# Patient Record
Sex: Male | Born: 1935 | Race: White | Hispanic: No | Marital: Married | Smoking: Former smoker
Health system: Southern US, Community
[De-identification: ages and names within clinical notes are randomized; demographics above are authoritative.]

## PROBLEM LIST (undated history)

## (undated) DIAGNOSIS — G609 Hereditary and idiopathic neuropathy, unspecified: Secondary | ICD-10-CM

## (undated) DIAGNOSIS — I739 Peripheral vascular disease, unspecified: Secondary | ICD-10-CM

## (undated) DIAGNOSIS — K219 Gastro-esophageal reflux disease without esophagitis: Secondary | ICD-10-CM

## (undated) DIAGNOSIS — R519 Headache, unspecified: Secondary | ICD-10-CM

## (undated) DIAGNOSIS — C61 Malignant neoplasm of prostate: Secondary | ICD-10-CM

## (undated) DIAGNOSIS — K209 Esophagitis, unspecified: Secondary | ICD-10-CM

## (undated) DIAGNOSIS — K644 Residual hemorrhoidal skin tags: Secondary | ICD-10-CM

## (undated) DIAGNOSIS — F528 Other sexual dysfunction not due to a substance or known physiological condition: Secondary | ICD-10-CM

## (undated) DIAGNOSIS — E538 Deficiency of other specified B group vitamins: Secondary | ICD-10-CM

## (undated) DIAGNOSIS — Z9089 Acquired absence of other organs: Secondary | ICD-10-CM

## (undated) DIAGNOSIS — K2980 Duodenitis without bleeding: Secondary | ICD-10-CM

## (undated) DIAGNOSIS — M109 Gout, unspecified: Secondary | ICD-10-CM

## (undated) DIAGNOSIS — I10 Essential (primary) hypertension: Secondary | ICD-10-CM

## (undated) DIAGNOSIS — K222 Esophageal obstruction: Secondary | ICD-10-CM

## (undated) DIAGNOSIS — H33311 Horseshoe tear of retina without detachment, right eye: Secondary | ICD-10-CM

## (undated) DIAGNOSIS — Z87891 Personal history of nicotine dependence: Secondary | ICD-10-CM

## (undated) DIAGNOSIS — G250 Essential tremor: Secondary | ICD-10-CM

## (undated) DIAGNOSIS — G252 Other specified forms of tremor: Secondary | ICD-10-CM

## (undated) HISTORY — DX: Essential tremor: G25.0

## (undated) HISTORY — DX: Malignant neoplasm of prostate: C61

## (undated) HISTORY — DX: Esophageal obstruction: K22.2

## (undated) HISTORY — DX: Duodenitis without bleeding: K29.80

## (undated) HISTORY — DX: Esophagitis, unspecified: K20.9

## (undated) HISTORY — PX: TONSILLECTOMY: SHX5217

## (undated) HISTORY — DX: Other specified forms of tremor: G25.2

## (undated) HISTORY — DX: Gastro-esophageal reflux disease without esophagitis: K21.9

## (undated) HISTORY — DX: Deficiency of other specified B group vitamins: E53.8

## (undated) HISTORY — DX: Horseshoe tear of retina without detachment, right eye: H33.311

## (undated) HISTORY — DX: Residual hemorrhoidal skin tags: K64.4

## (undated) HISTORY — DX: Hereditary and idiopathic neuropathy, unspecified: G60.9

## (undated) HISTORY — PX: OTHER SURGICAL HISTORY: SHX169

## (undated) HISTORY — DX: Essential (primary) hypertension: I10

## (undated) HISTORY — DX: Gout, unspecified: M10.9

## (undated) HISTORY — DX: Acquired absence of other organs: Z90.89

## (undated) HISTORY — DX: Peripheral vascular disease, unspecified: I73.9

## (undated) HISTORY — DX: Other sexual dysfunction not due to a substance or known physiological condition: F52.8

## (undated) HISTORY — DX: Personal history of nicotine dependence: Z87.891

---

## 2004-06-03 ENCOUNTER — Ambulatory Visit: Payer: Self-pay | Admitting: Internal Medicine

## 2004-06-10 ENCOUNTER — Ambulatory Visit: Payer: Self-pay | Admitting: Internal Medicine

## 2004-07-08 ENCOUNTER — Ambulatory Visit: Payer: Self-pay

## 2004-07-09 ENCOUNTER — Ambulatory Visit: Payer: Self-pay | Admitting: Gastroenterology

## 2004-08-04 ENCOUNTER — Ambulatory Visit (HOSPITAL_BASED_OUTPATIENT_CLINIC_OR_DEPARTMENT_OTHER): Admission: RE | Admit: 2004-08-04 | Discharge: 2004-08-04 | Payer: Self-pay | Admitting: General Surgery

## 2004-08-04 ENCOUNTER — Ambulatory Visit (HOSPITAL_COMMUNITY): Admission: RE | Admit: 2004-08-04 | Discharge: 2004-08-04 | Payer: Self-pay | Admitting: General Surgery

## 2004-08-04 ENCOUNTER — Ambulatory Visit: Payer: Self-pay | Admitting: Gastroenterology

## 2004-08-04 ENCOUNTER — Encounter (INDEPENDENT_AMBULATORY_CARE_PROVIDER_SITE_OTHER): Payer: Self-pay | Admitting: *Deleted

## 2004-09-05 ENCOUNTER — Ambulatory Visit: Payer: Self-pay | Admitting: Gastroenterology

## 2004-09-11 ENCOUNTER — Ambulatory Visit: Payer: Self-pay | Admitting: Gastroenterology

## 2004-10-16 ENCOUNTER — Ambulatory Visit: Payer: Self-pay | Admitting: Gastroenterology

## 2004-10-22 ENCOUNTER — Ambulatory Visit: Payer: Self-pay | Admitting: Gastroenterology

## 2004-11-10 ENCOUNTER — Ambulatory Visit: Payer: Self-pay | Admitting: Gastroenterology

## 2004-11-10 ENCOUNTER — Ambulatory Visit: Payer: Self-pay | Admitting: Internal Medicine

## 2005-02-11 ENCOUNTER — Ambulatory Visit: Payer: Self-pay | Admitting: Gastroenterology

## 2005-06-09 ENCOUNTER — Ambulatory Visit: Payer: Self-pay | Admitting: Internal Medicine

## 2005-06-15 ENCOUNTER — Ambulatory Visit: Payer: Self-pay | Admitting: Internal Medicine

## 2006-08-03 ENCOUNTER — Ambulatory Visit: Payer: Self-pay | Admitting: Internal Medicine

## 2006-08-03 LAB — CONVERTED CEMR LAB
AST: 25 units/L (ref 0–37)
Albumin: 3.7 g/dL (ref 3.5–5.2)
Basophils Relative: 0.9 % (ref 0.0–1.0)
CO2: 28 meq/L (ref 19–32)
Chloride: 103 meq/L (ref 96–112)
Cholesterol: 170 mg/dL (ref 0–200)
Creatinine, Ser: 0.8 mg/dL (ref 0.4–1.5)
Eosinophils Relative: 2.1 % (ref 0.0–5.0)
Glucose, Bld: 103 mg/dL — ABNORMAL HIGH (ref 70–99)
HCT: 42.1 % (ref 39.0–52.0)
HDL: 42.5 mg/dL (ref >39.0)
Hemoglobin, Urine: NEGATIVE
Hemoglobin: 14.4 g/dL (ref 13.0–17.0)
Ketones, ur: NEGATIVE mg/dL
LDL Cholesterol: 106 mg/dL — ABNORMAL HIGH (ref 0–99)
Leukocytes, UA: NEGATIVE
Monocytes Absolute: 0.5 10*3/uL (ref 0.2–0.7)
Neutrophils Relative %: 65.8 % (ref 43.0–77.0)
Nitrite: NEGATIVE
PSA: 2.68 ng/mL (ref 0.10–4.00)
Potassium: 4.4 meq/L (ref 3.5–5.1)
RBC: 4.32 M/uL (ref 4.22–5.81)
RDW: 12 % (ref 11.5–14.6)
Sodium: 137 meq/L (ref 135–145)
Total Bilirubin: 0.9 mg/dL (ref 0.3–1.2)
Total Protein: 6.7 g/dL (ref 6.0–8.3)
Urobilinogen, UA: 0.2 (ref 0.0–1.0)
VLDL: 22 mg/dL (ref 0–40)
WBC: 4.7 10*3/uL (ref 4.5–10.5)
pH: 6 (ref 5.0–8.0)

## 2006-08-11 ENCOUNTER — Ambulatory Visit: Payer: Self-pay | Admitting: Internal Medicine

## 2006-12-23 ENCOUNTER — Ambulatory Visit: Payer: Self-pay | Admitting: Internal Medicine

## 2007-07-14 ENCOUNTER — Encounter: Payer: Self-pay | Admitting: *Deleted

## 2007-07-14 DIAGNOSIS — I10 Essential (primary) hypertension: Secondary | ICD-10-CM

## 2007-07-14 DIAGNOSIS — G609 Hereditary and idiopathic neuropathy, unspecified: Secondary | ICD-10-CM

## 2007-07-14 DIAGNOSIS — I739 Peripheral vascular disease, unspecified: Secondary | ICD-10-CM

## 2007-07-14 DIAGNOSIS — G250 Essential tremor: Secondary | ICD-10-CM

## 2007-07-14 DIAGNOSIS — K2980 Duodenitis without bleeding: Secondary | ICD-10-CM | POA: Insufficient documentation

## 2007-07-14 DIAGNOSIS — K209 Esophagitis, unspecified without bleeding: Secondary | ICD-10-CM

## 2007-07-14 DIAGNOSIS — E538 Deficiency of other specified B group vitamins: Secondary | ICD-10-CM

## 2007-07-14 DIAGNOSIS — F528 Other sexual dysfunction not due to a substance or known physiological condition: Secondary | ICD-10-CM

## 2007-07-14 DIAGNOSIS — K644 Residual hemorrhoidal skin tags: Secondary | ICD-10-CM | POA: Insufficient documentation

## 2007-07-14 DIAGNOSIS — K219 Gastro-esophageal reflux disease without esophagitis: Secondary | ICD-10-CM

## 2007-07-14 DIAGNOSIS — Z9089 Acquired absence of other organs: Secondary | ICD-10-CM

## 2007-07-14 DIAGNOSIS — G252 Other specified forms of tremor: Secondary | ICD-10-CM

## 2007-07-14 DIAGNOSIS — M109 Gout, unspecified: Secondary | ICD-10-CM

## 2007-07-14 HISTORY — DX: Other sexual dysfunction not due to a substance or known physiological condition: F52.8

## 2007-07-14 HISTORY — DX: Esophagitis, unspecified without bleeding: K20.90

## 2007-07-14 HISTORY — DX: Essential (primary) hypertension: I10

## 2007-07-14 HISTORY — DX: Hereditary and idiopathic neuropathy, unspecified: G60.9

## 2007-07-14 HISTORY — DX: Essential tremor: G25.0

## 2007-07-14 HISTORY — DX: Gastro-esophageal reflux disease without esophagitis: K21.9

## 2007-07-14 HISTORY — DX: Gout, unspecified: M10.9

## 2007-07-14 HISTORY — DX: Acquired absence of other organs: Z90.89

## 2007-07-14 HISTORY — DX: Peripheral vascular disease, unspecified: I73.9

## 2007-07-14 HISTORY — DX: Deficiency of other specified B group vitamins: E53.8

## 2007-07-14 HISTORY — DX: Duodenitis without bleeding: K29.80

## 2007-07-14 HISTORY — DX: Residual hemorrhoidal skin tags: K64.4

## 2007-07-14 HISTORY — DX: Essential tremor: G25.2

## 2007-09-08 ENCOUNTER — Ambulatory Visit: Payer: Self-pay | Admitting: Internal Medicine

## 2007-09-08 LAB — CONVERTED CEMR LAB
BUN: 9 mg/dL (ref 6–23)
Basophils Absolute: 0 10*3/uL (ref 0.0–0.1)
Creatinine, Ser: 0.8 mg/dL (ref 0.4–1.5)
Folate: 10 ng/mL
GFR calc Af Amer: 122 mL/min
GFR calc non Af Amer: 101 mL/min
Hemoglobin: 15.9 g/dL (ref 13.0–17.0)
Lymphocytes Relative: 22.6 % (ref 12.0–46.0)
MCHC: 34.2 g/dL (ref 30.0–36.0)
Monocytes Absolute: 0.6 10*3/uL (ref 0.1–1.0)
Neutro Abs: 3.5 10*3/uL (ref 1.4–7.7)
PSA: 2.8 ng/mL (ref 0.10–4.00)
Platelets: 203 10*3/uL (ref 150–400)
Potassium: 4.2 meq/L (ref 3.5–5.1)
RDW: 12.4 % (ref 11.5–14.6)
Vitamin B-12: 613 pg/mL (ref 211–911)

## 2007-09-11 ENCOUNTER — Encounter: Payer: Self-pay | Admitting: Internal Medicine

## 2007-10-10 ENCOUNTER — Telehealth: Payer: Self-pay | Admitting: Internal Medicine

## 2007-10-10 DIAGNOSIS — M25519 Pain in unspecified shoulder: Secondary | ICD-10-CM

## 2008-10-23 ENCOUNTER — Ambulatory Visit: Payer: Self-pay | Admitting: Internal Medicine

## 2008-10-23 LAB — CONVERTED CEMR LAB
BUN: 9 mg/dL (ref 6–23)
CO2: 32 meq/L (ref 19–32)
Chloride: 101 meq/L (ref 96–112)
Creatinine, Ser: 0.8 mg/dL (ref 0.4–1.5)

## 2009-01-21 ENCOUNTER — Encounter: Payer: Self-pay | Admitting: Internal Medicine

## 2009-01-30 ENCOUNTER — Telehealth: Payer: Self-pay | Admitting: Internal Medicine

## 2009-01-31 ENCOUNTER — Encounter: Payer: Self-pay | Admitting: Internal Medicine

## 2009-04-10 ENCOUNTER — Encounter: Payer: Self-pay | Admitting: Internal Medicine

## 2009-04-19 ENCOUNTER — Ambulatory Visit: Payer: Self-pay | Admitting: Internal Medicine

## 2009-05-28 ENCOUNTER — Encounter: Payer: Self-pay | Admitting: Internal Medicine

## 2009-05-29 ENCOUNTER — Telehealth: Payer: Self-pay | Admitting: Internal Medicine

## 2009-06-27 ENCOUNTER — Telehealth: Payer: Self-pay | Admitting: Internal Medicine

## 2009-06-27 ENCOUNTER — Encounter: Payer: Self-pay | Admitting: Internal Medicine

## 2010-02-12 ENCOUNTER — Encounter: Payer: Self-pay | Admitting: Internal Medicine

## 2010-02-12 ENCOUNTER — Ambulatory Visit: Payer: Self-pay | Admitting: Internal Medicine

## 2010-02-12 DIAGNOSIS — Z87891 Personal history of nicotine dependence: Secondary | ICD-10-CM

## 2010-02-12 HISTORY — DX: Personal history of nicotine dependence: Z87.891

## 2010-02-12 LAB — CONVERTED CEMR LAB
BUN: 8 mg/dL (ref 6–23)
Basophils Relative: 0.7 % (ref 0.0–3.0)
Chloride: 101 meq/L (ref 96–112)
Cholesterol: 205 mg/dL — ABNORMAL HIGH (ref 0–200)
Creatinine, Ser: 0.9 mg/dL (ref 0.4–1.5)
Eosinophils Absolute: 0.1 10*3/uL (ref 0.0–0.7)
GFR calc non Af Amer: 88.65 mL/min (ref >60.00)
HCT: 43.7 % (ref 39.0–52.0)
Hemoglobin: 15.2 g/dL (ref 13.0–17.0)
Lymphs Abs: 0.9 10*3/uL (ref 0.7–4.0)
MCHC: 34.7 g/dL (ref 30.0–36.0)
MCV: 96.9 fL (ref 78.0–100.0)
Monocytes Absolute: 0.5 10*3/uL (ref 0.1–1.0)
Neutro Abs: 3.6 10*3/uL (ref 1.4–7.7)
PSA, Free: 0.5 ng/mL
PSA: 3.54 ng/mL (ref <=4.00)
RBC: 4.51 M/uL (ref 4.22–5.81)
Total CHOL/HDL Ratio: 4

## 2010-03-25 NOTE — Progress Notes (Signed)
  Phone Note Outgoing Call   Reason for Call: Discuss lab or test results Summary of Call: reviewed blood pressure readings sent in by patient - too high.  Plan - continue furosemide           add cozaar 50mg  once a day -  need to have their local pharmacy - not mail order- for one month trial  Please call patient to inform them.  Thanks Initial call taken by: Jacques Navy MD,  May 29, 2009 9:52 AM    New/Updated Medications: LOSARTAN POTASSIUM 50 MG TABS (LOSARTAN POTASSIUM) 1 by mouth once daily Prescriptions: FUROSEMIDE 20 MG TABS (FUROSEMIDE) 1 by mouth once daily  #30 x 1   Entered by:   Ami Bullins CMA   Authorized by:   Jacques Navy MD   Signed by:   Bill Salinas CMA on 05/29/2009   Method used:   Faxed to ...       [facility]  [address] [phone]* (retail)       [address]       [place], [place]  [address]       Ph: [phone] or [phone]       Fax: [phone]   RxID:   [id number] LOSARTAN POTASSIUM 50 MG TABS (LOSARTAN POTASSIUM) 1 by mouth once daily  #30 x 0   Entered by:   Ami Bullins CMA   Authorized by:   Jacques Navy MD   Signed by:   Bill Salinas CMA on 05/29/2009   Method used:   Faxed to ...       [facility]  [address] [phone]* (retail)       [address]       [place], [place]  [address]       Ph: [phone] or [phone]       Fax: [phone]   RxID:   [id number]

## 2010-03-25 NOTE — Letter (Signed)
Summary: BP readings/Patient  BP readings/Patient   Imported By: Sherian Rein 07/01/2009 12:09:14  _____________________________________________________________________  External Attachment:    Type:   Image     Comment:   External Document

## 2010-03-25 NOTE — Letter (Signed)
Summary: Raechel Ache DO  Raechel Ache DO   Imported By: Sherian Rein 05/31/2009 12:00:18  _____________________________________________________________________  External Attachment:    Type:   Image     Comment:   External Document

## 2010-03-25 NOTE — Assessment & Plan Note (Signed)
Summary: F/U APPT / #/CD   Vital Signs:  Patient profile:   77 year old male Height:      72 inches Weight:      201 pounds BMI:     27.36 O2 Sat:      97 % on Room air Temp:     97.9 degrees F oral Pulse rate:   68 / minute BP sitting:   146 / 90  (left arm) Cuff size:   regular  Vitals Entered By: Bill Salinas CMA (April 19, 2009 3:42 PM)  O2 Flow:  Room air CC: follow-up visit on BP/ ab   Primary Care Provider:  Jacques Navy MD  CC:  follow-up visit on BP/ ab.  History of Present Illness: Patient has been monitoring BP since November: usually running in the 150's/80-90s. He has  not had any symptoms.   He did have a recent episode of soreness in the left arm described as soreness but not like a toothache with no radiation no chest pain.   He has had a skin cancer removed. He was found to have a superficial thrombosed varicose vein.   Current Medications (verified): 1)  Colchicine 0.6 Mg Tabs (Colchicine) .... Take 2 By Mouth Onset Then 1 Every 2 Hrs As Needed 2)  Allopurinol 100 Mg Tabs (Allopurinol) .Marland Kitchen.. 1 By Mouth Once Daily As Needed 3)  Cyanocobalamin 1000 Mcg/ml Inj Soln (Cyanocobalamin) .Marland Kitchen.. 1000 Micrograms Monthly Im 4)  Tagamet Hb 200 Mg  Tabs (Cimetidine) .... As Directed As Needed 5)  Viagra 100 Mg Tabs (Sildenafil Citrate) .Marland Kitchen.. 1 By Mouth Once Daily As Needed 6)  Aspirin 81 Mg  Tabs (Aspirin) .... Take 1 Tablet By Mouth Once A Day  Allergies (verified): No Known Drug Allergies PMH-FH-SH reviewed-no changes except otherwise noted  Review of Systems  The patient denies anorexia, fever, weight loss, chest pain, dyspnea on exertion, headaches, abdominal pain, difficulty walking, and enlarged lymph nodes.    Physical Exam  General:  alert, well-developed, and well-nourished.   Head:  normocephalic and atraumatic.   Eyes:  pupils equal, pupils round, and corneas and lenses clear.   Lungs:  normal respiratory effort and normal breath sounds.   Heart:   normal rate and regular rhythm.     Impression & Recommendations:  Problem # 1:  SHOULDER PAIN (ICD-719.41) Patient with arm pain most likely due to overuse. Very atypical for cardiac symptoms   Plan - no further work-up at this time. His updated medication list for this problem includes:    Aspirin 81 Mg Tabs (Aspirin) .Marland Kitchen... Take 1 tablet by mouth once a day  Problem # 2:  HYPERTENSION (ICD-401.9) patient's home records indicate moderate hypertension.  Plan - start diuretic therapy - furosemide.            Recheck BP -give two weeks to see full benefit of diuretic           if not controlled will add second agent.  His updated medication list for this problem includes:    Furosemide 20 Mg Tabs (Furosemide) .Marland Kitchen... 1 by mouth once daily  Complete Medication List: 1)  Colchicine 0.6 Mg Tabs (Colchicine) .... Take 2 by mouth onset then 1 every 2 hrs as needed 2)  Cyanocobalamin 1000 Mcg/ml Inj Soln (Cyanocobalamin) .Marland Kitchen.. 1000 micrograms monthly im 3)  Tagamet Hb 200 Mg Tabs (Cimetidine) .... As directed as needed 4)  Aspirin 81 Mg Tabs (Aspirin) .... Take 1 tablet by mouth once a  day 5)  Furosemide 20 Mg Tabs (Furosemide) .Marland Kitchen.. 1 by mouth once daily Prescriptions: FUROSEMIDE 20 MG TABS (FUROSEMIDE) 1 by mouth once daily  #30 x 1   Entered and Authorized by:   Jacques Navy MD   Signed by:   Jacques Navy MD on 04/19/2009   Method used:   Electronically to        [facility]  [address] [phone][facility] (retail)       [address]       [place], [place]  [address]       Ph: [phone] or [phone]       Fax: [phone]   RxID:   [id number]    Preventive Care Screening  Last Flu Shot:    Date:  12/17/2008    Results:  given

## 2010-03-25 NOTE — Progress Notes (Signed)
Summary: BP log from patient  BP log from patient   Imported By: Lester Bucklin 05/31/2009 10:48:45  _____________________________________________________________________  External Attachment:    Type:   Image     Comment:   External Document

## 2010-03-25 NOTE — Progress Notes (Signed)
Summary: BP READINGS  Phone Note Call from Patient   Summary of Call: Pt dropped off bp readings for DR's review. Pt c/o dizzyness and feeling unsteady since taking medication. Please advise.  Initial call taken by: Lamar Sprinkles, CMA,  Jun 27, 2009 3:55 PM  Follow-up for Phone Call        patient called: no answer.  BP readings or borderline. Recommend increasing furosemide to 40mg  once a day. Continue losartan 50mg  If dizziness and being unsteady on his feet continues will need to make additional adjustment to meds.  Follow-up by: Jacques Navy MD,  Jun 27, 2009 5:33 PM  Additional Follow-up for Phone Call Additional follow up Details #1::        Pt informed of above Additional Follow-up by: Lamar Sprinkles, CMA,  Jun 28, 2009 4:50 PM    New/Updated Medications: FUROSEMIDE 40 MG TABS (FUROSEMIDE) 1 once daily Prescriptions: LOSARTAN POTASSIUM 50 MG TABS (LOSARTAN POTASSIUM) 1 by mouth once daily  #30 x 0   Entered by:   Lamar Sprinkles, CMA   Authorized by:   Jacques Navy MD   Signed by:   Lamar Sprinkles, CMA on 06/28/2009   Method used:   [facility]ronically to        [facility]  [address] [phone]* (retail)       [address]       [place], [place]  [address]       Ph: [phone] or [phone]       Fax: [phone]   RxID:   [id number] FUROSEMIDE 40 MG TABS (FUROSEMIDE) 1 once daily  #90 x 1   Entered by:   Lamar Sprinkles, CMA   Authorized by:   Jacques Navy MD   Signed by:   Lamar Sprinkles, CMA on 06/28/2009   Method used:   [facility]ronically [facility]        [facility]  [address] [phone]* (retail)       [address]       [place], [place]  [address]       Ph: [phone] or [phone]       Fax: [phone]   RxID:   [id number]

## 2010-03-27 NOTE — Assessment & Plan Note (Signed)
Summary: yearly medicare physical-lb   Vital Signs:  Patient profile:   77 year old male Height:      72 inches Weight:      193 pounds BMI:     26.27 O2 Sat:      97 % on Room air Temp:     98.4 degrees F oral Pulse rate:   61 / minute BP sitting:   138 / 88  (left arm) Cuff size:   regular  Vitals Entered By: Bill Salinas CMA (February 12, 2010 11:01 AM)  O2 Flow:  Room air  Primary Care Provider:  Jacques Navy MD   History of Present Illness: Patient presents for medicare wellness exam.  He has had trouble with HTN control with episodes of lightheadedness. He stopped furosemide and felt better. He has restarted at 20mg  once daily. His SBP has been consistently in the 140's. Otherwise without symptoms.  He has had 4 episodes of gout this year. He has good relief with colchicine. He has not been taking allopurinol.  He has frequent dyspepsia which is relieved with tagamet. He also has dyspepsia with viagra ( a reported side-effect. )  He is 100% independent in ADLs. He is very active and has no fall risks. He evidences no signs of depression. He is cognitively intact with great recall, ability to full-fill all household responsibilities and manage his affairs.     Preventive Screening-Counseling & Management  Alcohol-Tobacco     Alcohol drinks/day: 1     Alcohol type: beer,wine     >5/day in last 3 mos: yes     Alcohol Counseling: not indicated; use of alcohol is not excessive or problematic     Smoking Status: quit     Year Quit: 1970s  Caffeine-Diet-Exercise     Caffeine use/day: 2 cups      Diet Comments: healthy diet     Does Patient Exercise: yes     Type of exercise: walking  Hep-HIV-STD-Contraception     Hepatitis Risk: no risk noted     HIV Risk: no risk noted     STD Risk: no risk noted     Dental Visit-last 6 months no     TSE monthly: no     Sun Exposure-Excessive: no  Safety-Violence-Falls     Seat Belt Use: yes     Helmet Use: n/a  Firearms in the Home: firearms in the home     Smoke Detectors: yes     Violence in the Home: no risk noted     Sexual Abuse: no     Fall Risk: low fall risk      Sexual History:  currently monogamous.        Drug Use:  never.        Blood Transfusions:  yes and prior to 1987.    Current Medications (verified): 1)  Colchicine 0.6 Mg Tabs (Colchicine) .... Take 2 By Mouth Onset Then 1 Every 2 Hrs As Needed 2)  Cyanocobalamin 1000 Mcg/ml Inj Soln (Cyanocobalamin) .Marland Kitchen.. 1000 Micrograms Monthly Im 3)  Tagamet Hb 200 Mg  Tabs (Cimetidine) .... As Directed As Needed 4)  Aspirin 81 Mg  Tabs (Aspirin) .... Take 1 Tablet By Mouth Once A Day 5)  Furosemide 40 Mg Tabs (Furosemide) .Marland Kitchen.. 1 Once Daily 6)  Losartan Potassium 50 Mg Tabs (Losartan Potassium) .Marland Kitchen.. 1 By Mouth Once Daily 7)  Allopurinol 100 Mg Tabs (Allopurinol) .Marland Kitchen.. 1 Once Daily  Allergies (verified): No Known Drug Allergies  Past History:  Past Medical History: Last updated: 07/14/2007 GERD (ICD-530.81) TREMOR, ESSENTIAL (ICD-333.1) PERIPHERAL NEUROPATHY (ICD-356.9) EXTERNAL HEMORRHOIDS (ICD-455.3) DUODENITIS, WITHOUT HEMORRHAGE (ICD-535.60) ESOPHAGITIS (ICD-530.10) PERIPHERAL VASCULAR DISEASE (ICD-443.9) HYPERTENSION (ICD-401.9) VITAMIN B12 DEFICIENCY (ICD-266.2) ERECTILE DYSFUNCTION (ICD-302.72) Hx of PODAGRA (ICD-274.9)    Past Surgical History: Last updated: 07/14/2007 * Hx of EXCISION OF LIPOMA * Hx of SURGERY TO REDUCE TURBINATE AND STRAIGTEN DEVIATED SEPTUM TONSILLECTOMY, HX OF (ICD-V45.79)  Family History: Last updated: 09/08/2007 Several family members with breast cancer 2 daughters with hypothyroidism  Social History: Last updated: 09/08/2007 Patient has been married '58 He has 2 daughters and 4 grandchildren His marriage is in good health and he and his wife work together outside on [employer] and [employer].   Work: retired.  Social History: Caffeine use/day:  2 cups  Dental Care w/in 6  mos.:  no Sun Exposure-Excessive:  no Seat Belt Use:  yes Fall Risk:  low fall risk Blood Transfusions:  yes, prior to 1987 Hepatitis Risk:  no risk noted HIV Risk:  no risk noted STD Risk:  no risk noted Sexual History:  currently monogamous Drug Use:  never  Review of Systems  The patient denies anorexia, fever, weight loss, weight gain, vision loss, decreased hearing, chest pain, syncope, dyspnea on exertion, peripheral edema, headaches, abdominal pain, hematochezia, severe indigestion/heartburn, muscle weakness, difficulty walking, depression, abnormal bleeding, enlarged lymph nodes, and angioedema.    Physical Exam  General:  alert, well-developed, well-nourished, well-hydrated, and normal appearance white male in no distress.   Head:  Normocephalic and atraumatic without obvious abnormalities. No apparent alopecia or balding. Eyes:  No corneal or conjunctival inflammation noted. EOMI. Perrla. Funduscopic exam benign, without hemorrhages, exudates or papilledema. Vision grossly normal. Ears:  External ear exam shows no significant lesions or deformities.  Otoscopic examination reveals clear canals, tympanic membranes are intact bilaterally without bulging, retraction, inflammation or discharge. Hearing is grossly normal bilaterally. Nose:  no external deformity and no external erythema.   Mouth:  no gingival abnormalities, no dental plaque, pharynx pink and moist, no erythema, no exudates, and no posterior lymphoid hypertrophy.   Neck:  supple, full ROM, no thyromegaly, and no carotid bruits.   Chest Wall:  no deformities and no tenderness.   Lungs:  Normal respiratory effort, chest expands symmetrically. Lungs are clear to auscultation, no crackles or wheezes. Heart:  Normal rate and regular rhythm. S1 and S2 normal without gallop, murmur, click, rub or other extra sounds. Abdomen:  soft, non-tender, normal bowel sounds, no distention, no guarding, no abdominal hernia, and no  hepatomegaly.   Prostate:  deferred to PSA Msk:  normal ROM, no joint tenderness, no joint swelling, no joint warmth, and no joint deformities.   Pulses:  2+ radial and DP pulses Extremities:  No clubbing, cyanosis, edema, or deformity noted with normal full range of motion of all joints.   Neurologic:  alert & oriented X3, cranial nerves II-XII intact, strength normal in all extremities, gait normal, and DTRs symmetrical and normal.   Skin:  turgor normal, color normal, no rashes, no ecchymoses, and no ulcerations.   Cervical Nodes:  no anterior cervical adenopathy and no posterior cervical adenopathy.   Inguinal Nodes:  no R inguinal adenopathy and no L inguinal adenopathy.   Psych:  Oriented X3, memory intact for recent and remote, normally interactive, good eye contact, and not anxious appearing.     Impression & Recommendations:  Problem # 1:  TOBACCO ABUSE, HX  OF (ICD-V15.82) Non-smoker. Spirometry is normal with lungs age less than chronilogic age.  Orders: Spirometry w/Graph (94010)  Problem # 2:  GOUT (ICD-274.9) 4 episodes this year. He has not been taking allopurinol. Explained the preventive nature of lowering uric acid.  Plan - Uric acid level with recommendations to follow.   His updated medication list for this problem includes:    Colchicine 0.6 Mg Tabs (Colchicine) .Marland Kitchen... Take 2 by mouth onset then 1 every 2 hrs as needed    Allopurinol 100 Mg Tabs (Allopurinol) .Marland Kitchen... 1 once daily  Orders: TLB-Uric Acid, Blood (84550-URIC)  Addendm - Uric Acid 6.8 - high end of normal range. Will not recommend continuing allopurinol  Problem # 3:  GERD (ICD-530.81) Minor problem per patient, altough his wife comments that he takes frequent doses of H2 blocker.   Plan - continued as needed use of H2 blocker (tagamet)  His updated medication list for this problem includes:    Tagamet Hb 200 Mg Tabs (Cimetidine) .Marland Kitchen... As directed as needed  Problem # 4:  TREMOR, ESSENTIAL  (ICD-333.1) Hardly noticeable on todays exam. No intervention or medication at this time.   Problem # 5:  PERIPHERAL NEUROPATHY (ICD-356.9) Remains a problem but not limiting his activities. He had been diagnosed as B12 deficient and has been on replacement but he has not noticed any significant improvement.  He is not interested in any additional medication for treatment.  Plan - B12 level.   Orders: TLB-B12 + Folate Pnl (82746_82607-B12/FOL)  Addendum - B12 level is in normal range.  Problem # 6:  HYPERTENSION (ICD-401.9) See HPI. Reviewed home readings: majority of SBP readings exceed 140. Discussed goal of 120/80 or at least SBP less than 140 consistently.  Plan - follow bl;ood pressure readings on furosemide 40mg  once daily and losartan.           if intolerant of furosemide ( lightheaded episodes, etc) can consider lower dose at 20mg  once daily and/or increase losartan to 100mg   His updated medication list for this problem includes:    Furosemide 40 Mg Tabs (Furosemide) .Marland Kitchen... 1 once daily    Losartan Potassium 50 Mg Tabs (Losartan potassium) .Marland Kitchen... 1 by mouth once daily  Orders: TLB-BMP (Basic Metabolic Panel-BMET) (80048-METABOL)  Problem # 7:  Preventive Health Care (ICD-V70.0)  Interval history significant for recurrent bouts of gout without any joint deformity; for difficulty with blood pressure control. No major illness, injury or surgery. Physical exam is normal.  Lab results reveal normal values except for minimal elevation in LDL cholesterol - no medication indicated but more diligent low fat diet. Current with colorectal cancer screening with last study July '06. Prostate cancer screening OK with PSA 3.54. Immunizations: tetnus Aug '10; Pneumovax Oct '08; shingles vaccine Oct '08. 12 Lead EKG without signs of ischemia.  In summary - a very nice man who is medically stable. Will attend to cholesterol with low fat diet. He will report back on blood pressure control and  medications. No indication to take allopurinol with normal range uric acid. May use colchicine for acute flares. He will return as needed or in 1 year for wellness exam.    Orders: Medicare -1st Annual Wellness Visit [phone])  Complete Medication List: 1)  Colchicine 0.6 Mg Tabs (Colchicine) .... Take 2 by mouth onset then 1 every 2 hrs as needed 2)  Cyanocobalamin 1000 Mcg/ml Inj Soln (Cyanocobalamin) .Marland Kitchen.. 1000 micrograms monthly im 3)  Tagamet Hb 200 Mg Tabs (Cimetidine) .... As directed  as needed 4)  Aspirin 81 Mg Tabs (Aspirin) .... Take 1 tablet by mouth once a day 5)  Furosemide 40 Mg Tabs (Furosemide) .Marland Kitchen.. 1 once daily 6)  Losartan Potassium 50 Mg Tabs (Losartan potassium) .Marland Kitchen.. 1 by mouth once daily 7)  Allopurinol 100 Mg Tabs (Allopurinol) .Marland Kitchen.. 1 once daily  Other Orders: T-PSA Total ([address]) EKG w/ Interpretation (93000) TLB-CBC Platelet - w/Differential (85025-CBCD) TLB-Lipid Panel (80061-LIPID)  Patient: Stephen Meza Note: All result statuses are Final unless otherwise noted.  Tests: (1) B12 + Folate Panel (B12/FOL)   Vitamin B12               749 pg/mL                   211-911   Folate                    9.7 ng/mL     Deficient  0.4 - 3.4 ng/mL     Indeterminate  3.4 - 5.4 ng/mL     Normal  >5.4 ng/mL  Tests: (2) CBC Platelet w/Diff (CBCD)   White Cell Count          5.2 K/uL                    4.5-10.5   Red Cell Count            4.51 Mil/uL                 4.22-5.81   Hemoglobin                15.2 g/dL                   76.2-83.1   Hematocrit                43.7 %                      39.0-52.0   MCV                       96.9 fl                     78.0-100.0   MCHC                      34.7 g/dL                   51.7-61.6   RDW                       13.5 %                      11.5-14.6   Platelet Count            193.0 K/uL                  150.0-400.0   Neutrophil %              69.3 %                      43.0-77.0   Lymphocyte %              18.4  %  12.0-46.0   Monocyte %                10.3 %                      3.0-12.0   Eosinophils%              1.3 %                       0.0-5.0   Basophils %               0.7 %                       0.0-3.0   Neutrophill Absolute      3.6 K/uL                    1.4-7.7   Lymphocyte Absolute       0.9 K/uL                    0.7-4.0   Monocyte Absolute         0.5 K/uL                    0.1-1.0  Eosinophils, Absolute                             0.1 K/uL                    0.0-0.7   Basophils Absolute        0.0 K/uL                    0.0-0.1  Tests: (3) BMP (METABOL)   Sodium                    139 mEq/L                   135-145   Potassium                 4.2 mEq/L                   3.5-5.1   Chloride                  101 mEq/L                   96-112   Carbon Dioxide            30 mEq/L                    19-32   Glucose                   88 mg/dL                    04-54   BUN                       8 mg/dL                     0-98   Creatinine                0.9 mg/dL  0.4-1.5   Calcium                   9.4 mg/dL                   1.1-91.4   GFR                       88.65 mL/min                >60.00  Tests: (4) Lipid Panel (LIPID)   Cholesterol          [H]  205 mg/dL                   7-829     ATP III Classification            Desirable:  < 200 mg/dL                    Borderline High:  200 - 239 mg/dL               High:  > = 240 mg/dL   Triglycerides             122.0 mg/dL                 5.6-213.0     Normal:  <150 mg/dL     Borderline High:  865 - 199 mg/dL   HDL                       78.46 mg/dL                 >96.29   VLDL Cholesterol          24.4 mg/dL                  5.2-84.1  CHO/HDL Ratio:  CHD Risk                             4                    Men          Women     1/2 Average Risk     3.4          3.3     Average Risk          5.0          4.4     2X Average Risk          9.6          7.1     3X Average Risk           15.0          11.0                           Tests: (5) Uric Acid (URIC)   Uric Acid                 6.8 mg/dL                   3.2-4.4  Tests: (6) Cholesterol LDL - Direct (DIRLDL)  Cholesterol LDL - Direct  132.7 mg/dL     Optimal:  <161 mg/dL     Near or Above Optimal:  100-129 mg/dL     Borderline High:  096-045 mg/dL     High:  409-811 mg/dL     Very High:  >914 mg/dL  Orders Added: 1)  T-PSA Total [[address]] 2)  EKG w/ Interpretation [93000] 3)  Spirometry w/Graph [94010] 4)  TLB-B12 + Folate Pnl [[id number]-B12/FOL] 5)  TLB-CBC Platelet - w/Differential [85025-CBCD] 6)  TLB-BMP (Basic Metabolic Panel-BMET) [80048-METABOL] 7)  TLB-Lipid Panel [80061-LIPID] 8)  TLB-Uric Acid, Blood [84550-URIC] 9)  Medicare -1st Annual Wellness Visit [G0438] 10)  Est. Patient Level III [08657]

## 2010-07-08 NOTE — Assessment & Plan Note (Signed)
[facility]                           PRIMARY CARE OFFICE NOTE   MERREL, CRABBE                 MRN:          [id number]  DATE:08/11/2006                            DOB:          1933-08-09    Stephen Meza is a very pleasant 77 year old gentleman who presents for  followup evaluation and exam.  He was last seen in the office June 15, 2005.  Please see that complete dictation.  The patient's past medical  history is well-documented in previous notes, including June 10, 2004  as well as the last office note.   INTERVAL HISTORY:  1. The patient had excision of a lipoma from the scapular area of his      back.  2. The patient had a bad bout of podagra treated with colchicine.  He      has only had 1 episode of gout in the last 12 months.  We did      discuss the indications for prophylaxis being more than 2 episodes      of gout in a 79-month period.  He has had no other medical problems      or changes since his last visit.   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  He has now been married for 50  years.  He has 2 daughters and 4 grandchildren.  His marriage is in good  health and he and his wife have been working together with regard to  improving the landscaping of his home.  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   CURRENT MEDICATIONS:  1. B12 injection 1000 mcg monthly.  2. Zegerid once daily.  3. Colchicine 0.6 mg p.r.n.  4. Viagra 100 mg p.r.n.   HEALTH MAINTENANCE:  The patient's last colonoscopy was September 11, 2004.  He also had EGD at that time.   REVIEW OF SYSTEMS:  Negative for any constitutional, cardiovascular,  respiratory, GI, or GU problems.   EXAMINATION:  Temperature was 97.8, blood pressure 156/86, pulse 87,  weight 186.  GENERAL APPEARANCE:   Well-nourished, well-developed gentleman who looks  his stated age in no acute distress.  HEENT:  Normocephalic, atraumatic.  EACs and TMs unremarkable.  Oropharynx with no buccal or palatal lesions.  Posterior pharynx was  clear.  Conjunctivae, sclerae clear.  Pupils are equal, round, and  reactive to light and accommodation.  Normal funduscopic exam.  NECK:  Supple without thyromegaly.  NODES:  No adenopathy was noted in the cervical or supraclavicular  regions.  CHEST:  No CVA tenderness.  LUNGS:  Clear to auscultation and percussion.  CARDIOVASCULAR:  2+ radial pulses.  No JVD or carotid bruits.  He had a  quiet precordium with regular rate and rhythm without murmurs, rubs, or  gallops.  ABDOMEN:  Soft.  No guarding or rebound.  No organo-splenomegaly was  noted.  GENITALIA:  Normal.  RECTAL:  Normal sphincter tone.  Prostate was smooth, round, and  normal  in size and contour without abnormalities.  EXTREMITIES:  Without cyanosis, clubbing, edema, or deformity.  NEUROLOGIC:  Nonfocal.  SKIN:  Clear.   DATABASE:  Hemoglobin 14.4 g, white count 4,700 with a normal  differential.  Chemistries with a glucose of 103.  Electrolytes were  normal.  Kidney function normal with a creatinine of 0.8 and a normal  GFR.  Liver functions were normal.  Cholesterol 170, triglycerides 109,  HDL 42.5, LDL was 106.  Thyroid function normal with a TSH of 3.89.  PSA  was normal at 2.68.  There was no significant increase from previous  studies.  Urinalysis was negative.   ASSESSMENT AND PLAN:  1. B12 deficiency.  The patient had a normal MCV.  B12 level was not      performed.  Plan is to continue his present regimen.  2. Gastrointestinal.  The patient with significant gastroesophageal      reflux disease, well-controlled with Zegerid.  3. Gout.  The patient with 1 episode of gout in the last 12 months.      Reviewing all of his lab work on our chart, he has never had a uric      acid level.   Again, discussed the indication for prophylactic      treatment being greater than 2 episodes of gout in a 24-month      period.  Plan, the patient to continue to use colchicine on a      p.r.n. basis.  4. Health maintenance.  The patient is currently up to date with a      colorectal cancer screening as his exam is unremarkable.   SUMMARY:  This is a very pleasant gentleman who is medically stable at  this time, and he is asked to return to see me in 1 year on a p.r.n.  basis.     Rosalyn Gess Norins, MD  Electronically Signed    MEN/MedQ  DD: 08/12/2006  DT: 08/12/2006  Job #: [id number]   cc:   Ebony Cargo

## 2011-01-29 ENCOUNTER — Other Ambulatory Visit: Payer: Self-pay | Admitting: *Deleted

## 2011-01-29 MED ORDER — CYANOCOBALAMIN 1000 MCG/ML IJ SOLN: 1000.0000 ug | Freq: Once | INTRAMUSCULAR | Status: AC

## 2011-02-23 ENCOUNTER — Encounter: Payer: Self-pay | Admitting: Internal Medicine

## 2011-03-10 ENCOUNTER — Encounter: Payer: Self-pay | Admitting: Internal Medicine

## 2011-03-10 ENCOUNTER — Ambulatory Visit (INDEPENDENT_AMBULATORY_CARE_PROVIDER_SITE_OTHER): Payer: Medicare Other | Admitting: Internal Medicine

## 2011-03-10 ENCOUNTER — Other Ambulatory Visit (INDEPENDENT_AMBULATORY_CARE_PROVIDER_SITE_OTHER): Payer: Medicare Other

## 2011-03-10 ENCOUNTER — Ambulatory Visit (INDEPENDENT_AMBULATORY_CARE_PROVIDER_SITE_OTHER)
Admission: RE | Admit: 2011-03-10 | Discharge: 2011-03-10 | Disposition: A | Discharge status: Home / Self Care | Payer: Medicare Other | Source: Ambulatory Visit | Attending: Internal Medicine | Admitting: Internal Medicine

## 2011-03-10 VITALS — BP 140/84 | HR 66 | Temp 97.9°F | Wt 196.0 lb

## 2011-03-10 DIAGNOSIS — R1310 Dysphagia, unspecified: Secondary | ICD-10-CM

## 2011-03-10 DIAGNOSIS — G609 Hereditary and idiopathic neuropathy, unspecified: Secondary | ICD-10-CM

## 2011-03-10 DIAGNOSIS — Z0000 Encounter for general adult medical examination without abnormal findings: Secondary | ICD-10-CM

## 2011-03-10 DIAGNOSIS — E538 Deficiency of other specified B group vitamins: Secondary | ICD-10-CM

## 2011-03-10 DIAGNOSIS — I10 Essential (primary) hypertension: Secondary | ICD-10-CM

## 2011-03-10 DIAGNOSIS — I739 Peripheral vascular disease, unspecified: Secondary | ICD-10-CM

## 2011-03-10 DIAGNOSIS — K219 Gastro-esophageal reflux disease without esophagitis: Secondary | ICD-10-CM

## 2011-03-10 DIAGNOSIS — M109 Gout, unspecified: Secondary | ICD-10-CM

## 2011-03-10 DIAGNOSIS — Z87891 Personal history of nicotine dependence: Secondary | ICD-10-CM

## 2011-03-10 DIAGNOSIS — R05 Cough: Secondary | ICD-10-CM

## 2011-03-10 DIAGNOSIS — R059 Cough, unspecified: Secondary | ICD-10-CM

## 2011-03-10 DIAGNOSIS — G252 Other specified forms of tremor: Secondary | ICD-10-CM

## 2011-03-10 DIAGNOSIS — F528 Other sexual dysfunction not due to a substance or known physiological condition: Secondary | ICD-10-CM

## 2011-03-10 LAB — COMPREHENSIVE METABOLIC PANEL
AST: 23 U/L (ref 0–37)
Albumin: 4.1 g/dL (ref 3.5–5.2)
Alkaline Phosphatase: 57 U/L (ref 39–117)
BUN: 7 mg/dL (ref 6–23)
Calcium: 8.9 mg/dL (ref 8.4–10.5)
Creatinine, Ser: 0.8 mg/dL (ref 0.4–1.5)
Glucose, Bld: 104 mg/dL — ABNORMAL HIGH (ref 70–99)
Potassium: 4.9 mEq/L (ref 3.5–5.1)

## 2011-03-10 LAB — VITAMIN B12: Vitamin B-12: 958 pg/mL — ABNORMAL HIGH (ref 211–911)

## 2011-03-10 LAB — URIC ACID: Uric Acid, Serum: 6.4 mg/dL (ref 4.0–7.8)

## 2011-03-10 NOTE — Progress Notes (Signed)
Subjective:    Patient ID: Stephen Meza, male    DOB: [birth year], 78 y.o.   MRN: [id number]  HPI The patient is here for annual Medicare wellness examination and management of other chronic and acute problems. Interval history negative for any serious medical illness, surgery, injury. He has had dental work for bad molar right mandible.  He does admit to solid food dysphagia with at least two episodes of near impaction requiring regurgitation in the last year. He has a h/o esophageal stricture requiring dilation.   The risk factors are reflected in the social history.  The roster of all physicians providing medical care to patient - is listed in the Snapshot section of the chart.  Activities of daily living:  The patient is 100% inedpendent in all ADLs: dressing, toileting, feeding as well as independent mobility  Home safety : The patient has smoke detectors in the home. Fall - house has been fall-safed. Discussed Grab bars and high commodes. They wear seatbelts. firearms are present in the home, kept in a safe fashion. There is no violence in the home.   There is no risks for hepatitis, STDs or HIV. There is no   history of blood transfusion. They have no travel history to infectious disease endemic areas of the world.  The patient has seen their dentist in the last six month. They have seen their eye doctor in the last year. They deny any hearing difficulty and have not had audiologic testing in the last year.  They do not  have excessive sun exposure. Discussed the need for sun protection: hats, long sleeves and use of sunscreen if there is significant sun exposure.   Diet: the importance of a healthy diet is discussed. They do have a healthy diet.  The patient has a regular exercise program: walking , 30 min duration, 4 per week.  The benefits of regular aerobic exercise were discussed.  Depression screen: there are no signs or vegative symptoms of depression- irritability,  change in appetite, anhedonia, sadness/tearfullness.  Cognitive assessment: the patient manages all their financial and personal affairs and is actively engaged.   The following portions of the patient's history were reviewed and updated as appropriate: allergies, current medications, past family history, past medical history,  past surgical history, past social history  and problem list.  Vision, hearing, body mass index were assessed and reviewed.   During the course of the visit the patient was educated and counseled about appropriate screening and preventive services including : fall prevention , diabetes screening, nutrition counseling, colorectal cancer screening, and recommended immunizations.  Past Medical History  Diagnosis Date  . DUODENITIS, WITHOUT HEMORRHAGE 07/14/2007  . ERECTILE DYSFUNCTION 07/14/2007  . ESOPHAGITIS 07/14/2007  . EXTERNAL HEMORRHOIDS 07/14/2007  . GERD 07/14/2007  . HYPERTENSION 07/14/2007  . PERIPHERAL NEUROPATHY 07/14/2007  . PERIPHERAL VASCULAR DISEASE 07/14/2007  . PODAGRA 07/14/2007  . SHOULDER PAIN 10/10/2007  . TOBACCO ABUSE, HX OF 02/12/2010  . TONSILLECTOMY, HX OF 07/14/2007  . TREMOR, ESSENTIAL 07/14/2007  . VITAMIN B12 DEFICIENCY 07/14/2007   Past Surgical History  Procedure Date  . Excision of lipoma   . Surgery to reduce turbinate and straighten deviated septum   . Tonsillectomy    Family History  Problem Relation Age of Onset  . Hypothyroidism Daughter   . Breast cancer Other    History   Social History  . Marital Status: Married    Spouse Name: N/A    Number of Children: N/A  .  Years of Education: N/A   Occupational History  . Not on file.   Social History Main Topics  . Smoking status: Former Games developer  . Smokeless tobacco: Not on file  . Alcohol Use: No  . Drug Use: No  . Sexually Active: Yes -- Male partner(s)   Other Topics Concern  . Not on file   Social History Narrative   HSG. Married '1958. 2 dtrs; 4 g-children. Work  - retired. 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       Review of Systems Constitutional:  Negative for fever, chills, activity change and unexpected weight change.  HEENT:  Negative for hearing loss, ear pain, congestion, neck stiffness and postnasal drip. Negative for sore throat or swallowing problems. Positive for dental complaints- broken molar and subsequent loss of tooth.   Eyes: Negative for vision loss or change in visual acuity.  Respiratory: Negative for chest tightness and wheezing. Negative for DOE.   Cardiovascular: Negative for chest pain or palpitations. No decreased exercise tolerance Gastrointestinal: No change in bowel habit. No bloating or gas. No reflux or indigestion Genitourinary: Negative for urgency, frequency, flank pain and difficulty urinating. Positive for ED Musculoskeletal: Negative for myalgias, back pain, arthralgias and gait problem.  Neurological: Negative for dizziness, tremors, weakness and headaches.  Hematological: Negative for adenopathy.  Psychiatric/Behavioral: Negative for behavioral problems and dysphoric mood.       Objective:   Physical Exam Filed Vitals:   03/10/11 1405  BP: 140/84  Pulse: 66  Temp: 97.9 F (36.6 C)   Gen'l: Well nourished well developed white male in no acute distress  HEENT: Head: Normocephalic and atraumatic. Right Ear: External ear normal. EAC/TM nl. Left Ear: External ear normal.  EAC/TM nl. Nose: Nose normal. Mouth/Throat: Oropharynx is clear and moist. Dentition - native. No buccal or palatal lesions. Posterior pharynx clear. Eyes: Conjunctivae and sclera clear. EOM intact. Pupils are equal, round, and reactive to light. Right eye exhibits no discharge. Left eye exhibits no discharge. Neck: Normal range of motion. Neck supple. No JVD present. No tracheal deviation present. No thyromegaly present.  Cardiovascular: Normal rate, regular rhythm, no  gallop, no friction rub, no murmur heard.      Quiet precordium. 2+ radial and DP pulses . No carotid bruits Pulmonary/Chest: Effort normal. No respiratory distress or increased WOB, no wheezes, no rales. No chest wall deformity or CVAT. Abdominal: Soft. Bowel sounds are normal in all quadrants. He exhibits no distension, no tenderness, no rebound or guarding, No heptosplenomegaly  Genitourinary:  deferred Musculoskeletal: Normal range of motion. He exhibits no edema and no tenderness.       Small and large joints without redness, synovial thickening or deformity. Full range of motion preserved about all small, median and large joints.  Lymphadenopathy:    He has no cervical or supraclavicular adenopathy.  Neurological: He is alert and oriented to person, place, and time. CN II-XII intact. DTRs 2+ and symmetrical biceps, radial and patellar tendons. Cerebellar function normal with no tremor, rigidity, normal gait and station. Decreased sharp dull sensation and deep vibratory sensation both feet. Skin: Skin is warm and dry. No rash noted. No erythema. Feet are clear of any wound or lesion. Psychiatric: He has a normal mood and affect. His behavior is normal. Thought content normal.   BMET    Component Value Date/Time   NA 139 03/10/2011 1514   K 4.9 03/10/2011 1514  CL 102 03/10/2011 1514   CO2 31 03/10/2011 1514   GLUCOSE 104* 03/10/2011 1514   BUN 7 03/10/2011 1514   CREATININE 0.8 03/10/2011 1514   CALCIUM 8.9 03/10/2011 1514   GFRNONAA 88.65 02/12/2010 1158   GFRAA 122 09/08/2007 1631   Lab Results  Component Value Date   ALT 21 03/10/2011   AST 23 03/10/2011   ALKPHOS 57 03/10/2011   BILITOT 0.8 03/10/2011        B12                       958                                                                   03/10/2011      Uric Acid               6.4                                                                    03/10/2011           Assessment & Plan:

## 2011-03-11 ENCOUNTER — Encounter: Payer: Self-pay | Admitting: Internal Medicine

## 2011-03-11 DIAGNOSIS — Z0001 Encounter for general adult medical examination with abnormal findings: Secondary | ICD-10-CM | POA: Insufficient documentation

## 2011-03-11 DIAGNOSIS — Z0000 Encounter for general adult medical examination without abnormal findings: Secondary | ICD-10-CM | POA: Insufficient documentation

## 2011-03-11 NOTE — Assessment & Plan Note (Signed)
Remains abstemious from tobacco. Chest X-ray without mass lesion, changes of emphysema are noted. He is asymptomatic.  Clinical Data: Cough, hypertension, former smoker  CHEST - 2 VIEW  Comparison: None  Findings:  Normal heart size and pulmonary vascularity.  Calcified tortuous aorta.  Emphysematous changes with minimal basilar atelectasis.  No acute infiltrate, pleural effusion or pneumothorax.  Multilevel endplate spur formation thoracic spine.  IMPRESSION:  Emphysematous changes with minimal bibasilar atelectasis.  Original Report Authenticated By: Lollie Marrow, M.D.

## 2011-03-11 NOTE — Assessment & Plan Note (Signed)
Very mild. NO indication for medical intervention

## 2011-03-11 NOTE — Assessment & Plan Note (Signed)
Continued decreased sensation both feet. No injuries noted. B12 level is in normal range with replacement injections.  Plan - continue B12 injections.           Inspect feet daily for subtle injury.

## 2011-03-11 NOTE — Assessment & Plan Note (Signed)
Normal B12 level at today's lab.  Plan - continue B12 replacement

## 2011-03-11 NOTE — Assessment & Plan Note (Signed)
BP Readings from Last 3 Encounters:  03/10/11 140/84  02/12/10 138/88  04/19/09 146/90   Borderline control. Renal function and electrolytes are normal  Plan - resume daily walking program           Continue losartan and furosemide

## 2011-03-11 NOTE — Assessment & Plan Note (Signed)
No flares of gout in the interval. 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 Uric acid level is in normal range.  Plan - continue allopurinol.

## 2011-03-11 NOTE — Assessment & Plan Note (Addendum)
History of esophagitis with stricture and need for dilation in the past, '06. Now with recurrent solid food dysphagia. He lists cimetidine 200 mg qid on his med list.  Plan - Continue H2 therapy           Referral to Dr. Marina Goodell for evaluation and treatment

## 2011-03-11 NOTE — Assessment & Plan Note (Signed)
Viagra causes abdominal discomfort.

## 2011-03-11 NOTE — Assessment & Plan Note (Addendum)
Interval history significant for solid food dysphagia, otherwise he has done well. Physical exam is normal. Lab results are in normal limits. He is current with colorectal cancer screening. He at 59, with a last PSA of 3.54 in '11, no longer needs to be screened for prostate cancer. Immunizations are up to date. Last 12 lead EKG  Dec '11 was without ischemia.  In summary - a very nice man who needs GI follow - up, who needs to resume his walking program daily, who is medically stable. He will be referred to Dr. Marina Goodell for GI evaluation. He will return on an as needed basis.

## 2011-03-11 NOTE — Assessment & Plan Note (Signed)
No c/o of symptoms at this time.  Plan - continue H2 therapy

## 2011-03-11 NOTE — Assessment & Plan Note (Signed)
Good pulses at DP noted on exam. No ischemic changes either foot.

## 2011-04-02 ENCOUNTER — Ambulatory Visit (INDEPENDENT_AMBULATORY_CARE_PROVIDER_SITE_OTHER): Payer: Medicare Other | Admitting: Internal Medicine

## 2011-04-02 ENCOUNTER — Encounter: Payer: Self-pay | Admitting: Internal Medicine

## 2011-04-02 VITALS — BP 140/82 | HR 68 | Ht 72.0 in | Wt 198.0 lb

## 2011-04-02 DIAGNOSIS — R1013 Epigastric pain: Secondary | ICD-10-CM

## 2011-04-02 DIAGNOSIS — K219 Gastro-esophageal reflux disease without esophagitis: Secondary | ICD-10-CM

## 2011-04-02 DIAGNOSIS — R1310 Dysphagia, unspecified: Secondary | ICD-10-CM

## 2011-04-02 DIAGNOSIS — K222 Esophageal obstruction: Secondary | ICD-10-CM

## 2011-04-02 MED ORDER — OMEPRAZOLE 40 MG PO CPDR: 40.0000 mg | DELAYED_RELEASE_CAPSULE | Freq: Every day | ORAL | Status: DC

## 2011-04-02 NOTE — Patient Instructions (Signed)
You have been scheduled for a Endoscopy with Dilitation, instructions have been provided. Your Omeprazole has been sent to your pharmacy.

## 2011-04-02 NOTE — Progress Notes (Signed)
HISTORY OF PRESENT ILLNESS:  Stephen Meza is a 78 y.o. male with the below listed medical history who presents today regarding epigastric discomfort, GERD, and dysphagia. Previously a patient of Dr. Terrial Rhodes. Referred today by Dr. Debby Bud. The patient is accompanied by his wife. Review of the old records findings the patient underwent colonoscopy in July of 2006 for the purposes of screening. This was normal. He also underwent upper endoscopy at that same time for reflux symptoms and dysphagia. He was found to have a peptic stricture which was dilated with a 54 [place] Maloney dilator. This helped. However, in recent years he has had intermittent solid food dysphagia least 4 times per year. Significant episode while eating out in recent weeks. He has heartburn at least 3 times per week for which he takes Tagamet on demand. He also has epigastric discomfort related to reflux symptoms. No weight loss. GI review of systems is otherwise negative.  REVIEW OF SYSTEMS:  All non-GI ROS negative except for sinus an allergy trouble, cough  Past Medical History  Diagnosis Date  . DUODENITIS, WITHOUT HEMORRHAGE 07/14/2007  . ERECTILE DYSFUNCTION 07/14/2007  . ESOPHAGITIS 07/14/2007  . EXTERNAL HEMORRHOIDS 07/14/2007  . GERD 07/14/2007  . HYPERTENSION 07/14/2007  . PERIPHERAL NEUROPATHY 07/14/2007  . PERIPHERAL VASCULAR DISEASE 07/14/2007  . PODAGRA 07/14/2007  . TOBACCO ABUSE, HX OF 02/12/2010  . TONSILLECTOMY, HX OF 07/14/2007  . TREMOR, ESSENTIAL 07/14/2007  . VITAMIN B12 DEFICIENCY 07/14/2007  . Gout     Past Surgical History  Procedure Date  . Excision of lipoma   . Surgery to reduce turbinate and straighten deviated septum   . Tonsillectomy     Social History Jaxzen A. Savo  reports that he quit smoking about 17 years ago. He has quit using smokeless tobacco. He reports that he drinks alcohol. He reports that he does not use illicit drugs.  family history includes Breast cancer in  his other and Hypothyroidism in his daughter.  Not on File     PHYSICAL EXAMINATION: Vital signs: BP 140/82  Pulse 68  Ht 6' (1.829 m)  Wt 198 lb (89.812 kg)  BMI 26.85 kg/m2  SpO2 98%  Constitutional: generally well-appearing, no acute distress Psychiatric: alert and oriented x3, cooperative Eyes: extraocular movements intact, anicteric, conjunctiva pink Mouth: oral pharynx moist, no lesions Neck: supple no lymphadenopathy Cardiovascular: heart regular rate and rhythm, no murmur Lungs: clear to auscultation bilaterally Abdomen: soft, nontender, nondistended, no obvious ascites, no peritoneal signs, normal bowel sounds, no organomegaly Extremities: no lower extremity edema bilaterally Skin: no lesions on visible extremities Neuro: No focal deficits.   ASSESSMENT:  #1. Chronic GERD #2. Recurrent intermittent solid food dysphagia due to known peptic stricture #3. Epigastric discomfort. Likely related to GERD. Does have a history of peptic ulcer disease #4. Colon cancer screening, up-to-date. Due for followup screening around 2016   PLAN:  #1. Reflux precautions #2. Prescribe omeprazole 40 mg daily. Multiple refills. This in lieu of Tagamet #3. Schedule upper endoscopy with esophageal dilation. This to evaluate abdominal pain and dysphagia.The nature of the procedure, as well as the risks, benefits, and alternatives were carefully and thoroughly reviewed with the patient. Ample time for discussion and questions allowed. The patient understood, was satisfied, and agreed to proceed.

## 2011-04-07 ENCOUNTER — Encounter: Payer: Self-pay | Admitting: Internal Medicine

## 2011-04-07 ENCOUNTER — Ambulatory Visit (AMBULATORY_SURGERY_CENTER): Payer: Medicare Other | Admitting: Internal Medicine

## 2011-04-07 DIAGNOSIS — R1013 Epigastric pain: Secondary | ICD-10-CM

## 2011-04-07 DIAGNOSIS — R1310 Dysphagia, unspecified: Secondary | ICD-10-CM

## 2011-04-07 DIAGNOSIS — K222 Esophageal obstruction: Secondary | ICD-10-CM

## 2011-04-07 DIAGNOSIS — K219 Gastro-esophageal reflux disease without esophagitis: Secondary | ICD-10-CM

## 2011-04-07 MED ORDER — SODIUM CHLORIDE 0.9 % IV SOLN: 500.0000 mL | INTRAVENOUS | Status: DC

## 2011-04-07 NOTE — Patient Instructions (Signed)
Please go to the lab today before you leave for bloodwork.  Linda from the 3rd floor will call you with the date and time of your CT scan.   Your recovery room nurse will give you the contrast needed for your CT.   You may resume your routine medications today.   IF you have any problems, please call us at [phone].  Thank-you.

## 2011-04-07 NOTE — Op Note (Signed)
[facility] [address] [place], [place]  [address]  ENDOSCOPY PROCEDURE REPORT  PATIENT:  Stephen Meza, Stephen Meza  MR#:  [id number] BIRTHDATE:  12-09-1933, 77 yrs. old  GENDER:  male  ENDOSCOPIST:  Wilhemina Bonito. Eda Keys, MD Referred by:  Office / Self  PROCEDURE DATE:  04/07/2011 PROCEDURE:  EGD, diagnostic 43235, Maloney Dilation of Esophagus - 58F ASA CLASS:  Class II INDICATIONS:  dysphagia, GERD, epigastric pain  MEDICATIONS:   MAC sedation, administered by CRNA, propofol (Diprivan) 120 mg IV TOPICAL ANESTHETIC:  none  DESCRIPTION OF PROCEDURE:   After the risks benefits and alternatives of the procedure were thoroughly explained, informed consent was obtained.  The LB GIF-H180 [id number] endoscope was introduced through the mouth and advanced to the second portion of the duodenum, without limitations.  The instrument was slowly withdrawn as the mucosa was fully examined. <<PROCEDUREIMAGES>>  A 15 mm ring-like stricture was found in the distal esophagus. Otherwise the examination of the esophagus, stomach, and duodenum was normal.    Retroflexed views revealed no abnormalities.    The scope was then withdrawn from the patient and the procedure completed.  THERAPY: 58F MALONEY DILATOR PASSED W/O RESISTANCE OR HEME. TOLERATED WELL.Marland Kitchen  COMPLICATIONS:  None  ENDOSCOPIC IMPRESSION: 1) Stricture in the distal esophagus - S/P DILATION 58F 2) Otherwise normal examination 3) GERD  RECOMMENDATIONS: 1) Clear liquids until 5PM, then soft foods rest of day. Resume prior diet tomorrow. 2) Continue OMEPRAZOLE DAILY 3) FOLLOW UP PRN  ______________________________ Wilhemina Bonito. Eda Keys, MD  CC:  Jacques Navy, MD;  The Patient  n. eSIGNED:   Wilhemina Bonito. Eda Keys at 04/07/2011 03:13 PM  Ebony Cargo, [phone]

## 2011-04-07 NOTE — Progress Notes (Signed)
Patient did not have preoperative order for IV antibiotic SSI prophylaxis. (G8918)  Patient did not experience any of the following events: a burn prior to discharge; a fall within the facility; wrong site/side/patient/procedure/implant event; or a hospital transfer or hospital admission upon discharge from the facility. (G8907)  

## 2011-04-08 ENCOUNTER — Telehealth: Payer: Self-pay | Admitting: *Deleted

## 2011-04-08 NOTE — Telephone Encounter (Signed)
  Follow up Call-  Call back number 04/07/2011  Post procedure Call Back phone  # [phone]  Permission to leave phone message Yes     Patient questions:  Do you have a fever, pain , or abdominal swelling? no Pain Score  0 *  Have you tolerated food without any problems? yes  Have you been able to return to your normal activities? yes  Do you have any questions about your discharge instructions: Diet   no Medications  no Follow up visit  no  Do you have questions or concerns about your Care? no  Actions: * If pain score is 4 or above: No action needed, pain <4.  Pt states that his throat was a little bit sore and he had some hoarseness but he gargled with warm salt water and that has seemed to help. Instructed pt to call back if symptoms worsen.

## 2011-07-28 ENCOUNTER — Other Ambulatory Visit: Payer: Self-pay | Admitting: Internal Medicine

## 2012-01-22 ENCOUNTER — Other Ambulatory Visit: Payer: Self-pay | Admitting: Internal Medicine

## 2012-02-09 ENCOUNTER — Other Ambulatory Visit: Payer: Self-pay

## 2012-02-24 DIAGNOSIS — C61 Malignant neoplasm of prostate: Secondary | ICD-10-CM

## 2012-02-24 HISTORY — DX: Malignant neoplasm of prostate: C61

## 2012-03-10 HISTORY — PX: PROSTATE BIOPSY: SHX241

## 2012-03-19 ENCOUNTER — Other Ambulatory Visit: Payer: Self-pay | Admitting: Internal Medicine

## 2012-04-12 ENCOUNTER — Other Ambulatory Visit (INDEPENDENT_AMBULATORY_CARE_PROVIDER_SITE_OTHER): Payer: Medicare Other

## 2012-04-12 ENCOUNTER — Ambulatory Visit (INDEPENDENT_AMBULATORY_CARE_PROVIDER_SITE_OTHER): Payer: Medicare Other | Admitting: Internal Medicine

## 2012-04-12 ENCOUNTER — Encounter: Payer: Self-pay | Admitting: Internal Medicine

## 2012-04-12 VITALS — BP 118/70 | HR 62 | Temp 97.5°F | Resp 10 | Ht 70.0 in | Wt 194.0 lb

## 2012-04-12 DIAGNOSIS — I10 Essential (primary) hypertension: Secondary | ICD-10-CM

## 2012-04-12 DIAGNOSIS — M109 Gout, unspecified: Secondary | ICD-10-CM

## 2012-04-12 DIAGNOSIS — E782 Mixed hyperlipidemia: Secondary | ICD-10-CM | POA: Insufficient documentation

## 2012-04-12 DIAGNOSIS — E7800 Pure hypercholesterolemia, unspecified: Secondary | ICD-10-CM

## 2012-04-12 DIAGNOSIS — Z0000 Encounter for general adult medical examination without abnormal findings: Secondary | ICD-10-CM

## 2012-04-12 DIAGNOSIS — C61 Malignant neoplasm of prostate: Secondary | ICD-10-CM

## 2012-04-12 DIAGNOSIS — F528 Other sexual dysfunction not due to a substance or known physiological condition: Secondary | ICD-10-CM

## 2012-04-12 DIAGNOSIS — E538 Deficiency of other specified B group vitamins: Secondary | ICD-10-CM

## 2012-04-12 DIAGNOSIS — K219 Gastro-esophageal reflux disease without esophagitis: Secondary | ICD-10-CM

## 2012-04-12 DIAGNOSIS — E785 Hyperlipidemia, unspecified: Secondary | ICD-10-CM | POA: Insufficient documentation

## 2012-04-12 LAB — COMPREHENSIVE METABOLIC PANEL
AST: 31 U/L (ref 0–37)
Albumin: 3.9 g/dL (ref 3.5–5.2)
BUN: 10 mg/dL (ref 6–23)
CO2: 31 mEq/L (ref 19–32)
Calcium: 9.2 mg/dL (ref 8.4–10.5)
Chloride: 101 mEq/L (ref 96–112)
GFR: 104.18 mL/min (ref >60.00)
Glucose, Bld: 131 mg/dL — ABNORMAL HIGH (ref 70–99)
Potassium: 4.3 mEq/L (ref 3.5–5.1)

## 2012-04-12 LAB — LIPID PANEL
Cholesterol: 160 mg/dL (ref 0–200)
Triglycerides: 160 mg/dL — ABNORMAL HIGH (ref 0.0–149.0)

## 2012-04-12 LAB — HEPATIC FUNCTION PANEL
Bilirubin, Direct: 0.1 mg/dL (ref 0.0–0.3)
Total Protein: 6.9 g/dL (ref 6.0–8.3)

## 2012-04-12 NOTE — Patient Instructions (Addendum)
Prostate Cancer - very low burden of disease with only 1 of 11 biopsies positive, normal CT of the pelvis.  Recommendation - watchful waiting with follow up PSA in 6 months and no other treatment, medical or surgical  Your exam today is normal. Labs are ordered and results will be in the report I send you  Please feel free to call or e-message thru MyChart for any concerns.

## 2012-04-12 NOTE — Progress Notes (Signed)
Subjective:    Patient ID: Stephen Meza, male    DOB: April 18, 1933, 79 y.o.   MRN: [id number]  HPI The patient is here for annual Medicare wellness examination and management of other chronic and acute problems.  Has been diagnosed with prostate cancer: 1: 12 biopsies positive, CT negative.  He has otherwise been fine.    The risk factors are reflected in the social history.  The roster of all physicians providing medical care to patient - is listed in the Snapshot section of the chart.  Activities of daily living:  The patient is 100% inedpendent in all ADLs: dressing, toileting, feeding as well as independent mobility  Home safety : The patient has smoke detectors in the home. Falls - no falls. Home is fall safe. They wear seatbelts.  firearms are present in the home, kept in a safe fashion.  There is no violence in the home.   There is no risks for hepatitis, STDs or HIV. There is no  history of blood transfusion. They have no travel history to infectious disease endemic areas of the world.  The patient has seen their dentist in the last six month. They have seen their eye doctor in the last year. They deny any hearing difficulty and have not had audiologic testing in the last year.    They do not  have excessive sun exposure. Discussed the need for sun protection: hats, long sleeves and use of sunscreen if there is significant sun exposure.   Diet: the importance of a healthy diet is discussed. They do have a healthy diet.  The patient has no regular exercise program.  The benefits of regular aerobic exercise were discussed.  Depression screen: there are no signs or vegative symptoms of depression- irritability, change in appetite, anhedonia, sadness/tearfullness.  Cognitive assessment: the patient manages all their financial and personal affairs and is actively engaged.   The following portions of the patient's history were reviewed and updated as appropriate: allergies,  current medications, past family history, past medical history,  past surgical history, past social history  and problem list.  Past Medical History  Diagnosis Date  . DUODENITIS, WITHOUT HEMORRHAGE 07/14/2007  . ERECTILE DYSFUNCTION 07/14/2007  . ESOPHAGITIS 07/14/2007  . EXTERNAL HEMORRHOIDS 07/14/2007  . GERD 07/14/2007  . HYPERTENSION 07/14/2007  . PERIPHERAL NEUROPATHY 07/14/2007  . PERIPHERAL VASCULAR DISEASE 07/14/2007  . PODAGRA 07/14/2007  . TOBACCO ABUSE, HX OF 02/12/2010  . TONSILLECTOMY, HX OF 07/14/2007  . TREMOR, ESSENTIAL 07/14/2007  . VITAMIN B12 DEFICIENCY 07/14/2007  . Gout   . Prostate cancer 02/2012    pt had screening and then biopsy   Past Surgical History  Procedure Laterality Date  . Excision of lipoma    . Surgery to reduce turbinate and straighten deviated septum    . Tonsillectomy    . Prostate biopsy Bilateral 03/10/2012   Family History  Problem Relation Age of Onset  . Hypothyroidism Daughter   . Breast cancer Other    History   Social History  . Marital Status: Married    Spouse Name: N/A    Number of Children: 2  . Years of Education: N/A   Occupational History  . retired    Social History Main Topics  . Smoking status: Former Smoker    Quit date: 02/23/1994  . Smokeless tobacco: Former Neurosurgeon  . Alcohol Use: 18.0 oz/week    30 Cans of beer per week  . Drug Use: No  . Sexually Active: Yes --  Male partner(s)   Other Topics Concern  . Not on file   Social History Narrative   HSG. Married '1958. 2 dtrs; 4 g-children. Work - retired. 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           Current Outpatient Prescriptions on File Prior to Visit  Medication Sig Dispense Refill  . allopurinol (ZYLOPRIM) 100 MG tablet Take 100 mg by mouth daily.        Marland Kitchen aspirin 81 MG tablet Take 160 mg by mouth daily.      . cimetidine (TAGAMET) 200 MG tablet Take 200 mg by mouth 4 (four) times  daily.        . colchicine 0.6 MG tablet Take 0.6 mg by mouth daily.        Marland Kitchen omeprazole (PRILOSEC) 40 MG capsule TAKE 1 CAPSULE (40 MG TOTAL) BY MOUTH DAILY.  30 capsule  11   No current facility-administered medications on file prior to visit.   Vision, hearing, body mass index were assessed and reviewed.   During the course of the visit the patient was educated and counseled about appropriate screening and preventive services including : fall prevention , diabetes screening, nutrition counseling, colorectal cancer screening, and recommended immunizations.    Review of Systems onstitutional:  Negative for fever, chills, activity change and unexpected weight change.  HEENT:  Negative for hearing loss, ear pain, congestion, neck stiffness and postnasal drip. Negative for sore throat or swallowing problems. Negative for dental complaints.   Eyes: Positive for vision loss or change in visual acuity OS diagnosed.  Respiratory: Negative for chest tightness and wheezing. Negative for DOE.   Cardiovascular: Negative for chest pain or palpitations. No decreased exercise tolerance Gastrointestinal: No change in bowel habit. No bloating or gas. No reflux or indigestion Genitourinary: Negative for urgency, frequency, flank pain and difficulty urinating.  Musculoskeletal: Negative for myalgias, back pain, arthralgias and gait problem.  Neurological: Negative for dizziness, tremors, weakness and headaches.  Hematological: Negative for adenopathy.  Psychiatric/Behavioral: Negative for behavioral problems and dysphoric mood.    Objective:   Physical Exam Filed Vitals:   04/12/12 1339  BP: 118/70  Pulse: 62  Temp: 97.5 F (36.4 C)  Resp: 10   Wt Readings from Last 3 Encounters:  04/12/12 194 lb (87.998 kg)  04/07/11 198 lb (89.812 kg)  04/02/11 198 lb (89.812 kg)   Gen'l: Well nourished well developed white male in no acute distress  HEENT: Head: Normocephalic and atraumatic. Right Ear:  External ear normal. EAC/TM nl. Left Ear: External ear normal.  EAC/TM nl. Nose: Nose normal. Mouth/Throat: Oropharynx is clear and moist. Dentition - native, in good repair but missing many teeth. No buccal or palatal lesions. Posterior pharynx clear. Eyes: Conjunctivae and sclera clear. EOM intact. Pupils are equal, round, and reactive to light. Right eye exhibits no discharge. Left eye exhibits no discharge. Neck: Normal range of motion. Neck supple. No JVD present. No tracheal deviation present. No thyromegaly present.  Cardiovascular: Normal rate, regular rhythm, no gallop, no friction rub, no murmur heard.      Quiet precordium. 2+ radial and DP pulses . No carotid bruits Pulmonary/Chest: Effort normal. No respiratory distress or increased WOB, no wheezes, no rales. No chest wall deformity or CVAT. Abdomen: Soft. Bowel sounds are normal in all quadrants. He exhibits no distension, no tenderness, no rebound or guarding, No heptosplenomegaly  Genitourinary:  deferred Musculoskeletal: Normal  range of motion. He exhibits no edema and no tenderness.       Small and large joints without redness, synovial thickening or deformity. Full range of motion preserved about all small, median and large joints.  Lymphadenopathy:    He has no cervical or supraclavicular adenopathy.  Neurological: He is alert and oriented to person, place, and time. CN II-XII intact. DTRs 2+ and symmetrical biceps, radial and patellar tendons. Cerebellar function normal with no tremor, rigidity, normal gait and station.  Skin: Skin is warm and dry. No rash noted. No erythema.  Psychiatric: He has a normal mood and affect. His behavior is normal. Thought content normal.   Lab Results  Component Value Date   WBC 5.2 02/12/2010   HGB 15.2 02/12/2010   HCT 43.7 02/12/2010   PLT 193.0 02/12/2010   GLUCOSE 131* 04/12/2012   CHOL 160 04/12/2012   TRIG 160.0* 04/12/2012   HDL 46.20 04/12/2012   LDLDIRECT 132.7 02/12/2010   LDLCALC  82 04/12/2012        ALT 24 04/12/2012   AST 31 04/12/2012        NA 138 04/12/2012   K 4.3 04/12/2012   CL 101 04/12/2012   CREATININE 0.8 04/12/2012   BUN 10 04/12/2012   CO2 31 04/12/2012   TSH 4.16 09/08/2007   PSA 3.54 02/12/2010          Assessment & Plan:

## 2012-04-13 DIAGNOSIS — C61 Malignant neoplasm of prostate: Secondary | ICD-10-CM | POA: Insufficient documentation

## 2012-04-13 NOTE — Assessment & Plan Note (Signed)
BP Readings from Last 3 Encounters:  04/12/12 118/70  04/07/11 152/98  04/02/11 140/82   Good control at today's visit. Kidney function and electrolytes are normal  Plan Continue present medication

## 2012-04-13 NOTE — Assessment & Plan Note (Signed)
Interval history significant for diagnosis of prostate cancer - see problem, otherwise normal. Physical exam is normal. Lab results are in normal range. He is current with colorectal cancer screening. Immunizations are up to date.   In summary - a very nice man who does appear to be medically stable.

## 2012-04-13 NOTE — Assessment & Plan Note (Signed)
Full review of outside records: mild elevation of PSA with a single spike to 8.8 that may have been inflammation with return to value close to baseline. He did have full eval with biopsy with 1: 11 positive. No spread of disease by CT.  Discussed at length: late on-set, very low disease burden with mild elevation of PSA. Natural history of disease reviewed. Complications related to treatment reviewed, especially in regard to adverse effect on quality of life. Given his age, low disease burden have strongly recommended "watchful waiting," no surgery or other treatment with follow up PSA in 6 months.

## 2012-04-13 NOTE — Assessment & Plan Note (Signed)
No flares.

## 2012-04-13 NOTE — Assessment & Plan Note (Signed)
Stable on his current dose of medication.

## 2012-04-13 NOTE — Assessment & Plan Note (Signed)
More of a problem since hormonal injection for prostate cancer. See discussion below.

## 2012-09-13 ENCOUNTER — Telehealth: Payer: Self-pay

## 2012-09-13 MED ORDER — OMEPRAZOLE 40 MG PO CPDR: 40.0000 mg | DELAYED_RELEASE_CAPSULE | Freq: Every day | ORAL | Status: DC

## 2012-09-13 NOTE — Telephone Encounter (Signed)
Refilled Omeprazole 

## 2012-11-23 ENCOUNTER — Telehealth: Payer: Self-pay | Admitting: Internal Medicine

## 2012-11-23 NOTE — Telephone Encounter (Signed)
Mrs. Mathes called back to say the injection he had at the urgent care was Kenalog.

## 2012-11-23 NOTE — Telephone Encounter (Signed)
1. Gout - colchicine dosing is 0.6 mg tab - 2 tabs at start of gout flare, may repeat 1 tab 2 hrs later. Limit of 3 tabs/24 hrs. Can repeat the next day. If no relief or if too much GI side effect - N/V or diarrhea may take NSAID, e.g. Ibuprofen 800 mg tid or we can call in indomethacin 25 mg tid.  2. Sinus infection - would recommend taking antibiotics as prescribed  3. Happy to see him tomorrow - Thursday 10/2

## 2012-11-23 NOTE — Telephone Encounter (Signed)
Stephen Meza went to an UC last week for a sinus infection.  They gave him an injection and an RX for Amoxtr-Kclv.  One side effect of this is joint pain, so he stopped the medicine. He now has a bad case of gout in his R. Hand a foot.  He started taking the Colchicine yesterday.  Last night he started having diarrhea.  It is pretty severe.  Should he stop taking the Colchicine?  He felt too bad to try to come for an appt.

## 2012-11-24 MED ORDER — COLCHICINE 0.6 MG PO TABS: ORAL_TABLET | ORAL | Status: DC

## 2012-11-24 MED ORDER — ALLOPURINOL 100 MG PO TABS: 100.0000 mg | ORAL_TABLET | Freq: Every day | ORAL | Status: DC

## 2012-11-24 NOTE — Telephone Encounter (Signed)
#  9, 3 refills

## 2012-11-24 NOTE — Telephone Encounter (Signed)
Patient's wife notified regarding the gout, and to take the prescribed antibiotics. She states he will not come in for an office visit. Requests new prescriptions be sent to [facility] on [address] for Colchicine and Allopurinol. Allopurinol script was electronically sent.   I can send the Colchicine, how many tabs do you want dispensed?

## 2012-11-24 NOTE — Telephone Encounter (Signed)
done

## 2012-11-28 ENCOUNTER — Ambulatory Visit (INDEPENDENT_AMBULATORY_CARE_PROVIDER_SITE_OTHER): Payer: Medicare Other | Admitting: Internal Medicine

## 2012-11-28 ENCOUNTER — Ambulatory Visit (INDEPENDENT_AMBULATORY_CARE_PROVIDER_SITE_OTHER)
Admission: RE | Admit: 2012-11-28 | Discharge: 2012-11-28 | Disposition: A | Discharge status: Home / Self Care | Payer: Medicare Other | Source: Ambulatory Visit | Attending: Internal Medicine | Admitting: Internal Medicine

## 2012-11-28 ENCOUNTER — Encounter: Payer: Self-pay | Admitting: Internal Medicine

## 2012-11-28 ENCOUNTER — Other Ambulatory Visit (INDEPENDENT_AMBULATORY_CARE_PROVIDER_SITE_OTHER): Payer: Medicare Other

## 2012-11-28 VITALS — BP 148/90 | HR 86 | Temp 99.1°F | Wt 188.8 lb

## 2012-11-28 DIAGNOSIS — M109 Gout, unspecified: Secondary | ICD-10-CM

## 2012-11-28 DIAGNOSIS — M19031 Primary osteoarthritis, right wrist: Secondary | ICD-10-CM

## 2012-11-28 DIAGNOSIS — I10 Essential (primary) hypertension: Secondary | ICD-10-CM

## 2012-11-28 DIAGNOSIS — W57XXXA Bitten or stung by nonvenomous insect and other nonvenomous arthropods, initial encounter: Secondary | ICD-10-CM

## 2012-11-28 DIAGNOSIS — T148 Other injury of unspecified body region: Secondary | ICD-10-CM

## 2012-11-28 DIAGNOSIS — M19039 Primary osteoarthritis, unspecified wrist: Secondary | ICD-10-CM

## 2012-11-28 MED ORDER — CYANOCOBALAMIN 1000 MCG/ML IJ SOLN: 1000.0000 ug | Freq: Once | INTRAMUSCULAR | Status: DC

## 2012-11-28 NOTE — Patient Instructions (Addendum)
1. Gout - sounds like a flare of gout both toe and wrist. Now looking better Plan Uric acid level  For acute flare colchicine 0.6 mg take 2 tablets, may take one additional tablet at 2 hrs if needed. May repeat the next day  Allopurinol for protection - only if uric acid level is elevated  If colchicine doen't reduce the pain you can take Alevel 2 tabs twice a day.  2. Lyme's disease - Maybe. Plan Lyme's disease antibody titres.   If positive you may need antibiotic therapy.   Gout Gout is an inflammatory condition (arthritis) caused by a buildup of uric acid crystals in the joints. Uric acid is a chemical that is normally present in the blood. Under some circumstances, uric acid can form into crystals in your joints. This causes joint redness, soreness, and swelling (inflammation). Repeat attacks are common. Over time, uric acid crystals can form into masses (tophi) near a joint, causing disfigurement. Gout is treatable and often preventable. CAUSES  The disease begins with elevated levels of uric acid in the blood. Uric acid is produced by your body when it breaks down a naturally found substance called purines. This also happens when you eat certain foods such as meats and fish. Causes of an elevated uric acid level include:  Being passed down from parent to child (heredity).  Diseases that cause increased uric acid production (obesity, psoriasis, some cancers).  Excessive alcohol use.  Diet, especially diets rich in meat and seafood.  Medicines, including certain cancer-fighting drugs (chemotherapy), diuretics, and aspirin.  Chronic kidney disease. The kidneys are no longer able to remove uric acid well.  Problems with metabolism. Conditions strongly associated with gout include:  Obesity.  High blood pressure.  High cholesterol.  Diabetes. Not everyone with elevated uric acid levels gets gout. It is not understood why some people get gout and others do not. Surgery, joint  injury, and eating too much of certain foods are some of the factors that can lead to gout. SYMPTOMS   An attack of gout comes on quickly. It causes intense pain with redness, swelling, and warmth in a joint.  Fever can occur.  Often, only one joint is involved. Certain joints are more commonly involved:  Base of the big toe.  Knee.  Ankle.  Wrist.  Finger. Without treatment, an attack usually goes away in a few days to weeks. Between attacks, you usually will not have symptoms, which is different from many other forms of arthritis. DIAGNOSIS  Your caregiver will suspect gout based on your symptoms and exam. Removal of fluid from the joint (arthrocentesis) is done to check for uric acid crystals. Your caregiver will give you a medicine that numbs the area (local anesthetic) and use a needle to remove joint fluid for exam. Gout is confirmed when uric acid crystals are seen in joint fluid, using a special microscope. Sometimes, blood, urine, and X-ray tests are also used. TREATMENT  There are 2 phases to gout treatment: treating the sudden onset (acute) attack and preventing attacks (prophylaxis). Treatment of an Acute Attack  Medicines are used. These include anti-inflammatory medicines or steroid medicines.  An injection of steroid medicine into the affected joint is sometimes necessary.  The painful joint is rested. Movement can worsen the arthritis.  You may use warm or cold treatments on painful joints, depending which works best for you.  Discuss the use of coffee, vitamin C, or cherries with your caregiver. These may be helpful treatment options. Treatment  to Prevent Attacks After the acute attack subsides, your caregiver may advise prophylactic medicine. These medicines either help your kidneys eliminate uric acid from your body or decrease your uric acid production. You may need to stay on these medicines for a very long time. The early phase of treatment with prophylactic  medicine can be associated with an increase in acute gout attacks. For this reason, during the first few months of treatment, your caregiver may also advise you to take medicines usually used for acute gout treatment. Be sure you understand your caregiver's directions. You should also discuss dietary treatment with your caregiver. Certain foods such as meats and fish can increase uric acid levels. Other foods such as dairy can decrease levels. Your caregiver can give you a list of foods to avoid. HOME CARE INSTRUCTIONS   Do not take aspirin to relieve pain. This raises uric acid levels.  Only take over-the-counter or prescription medicines for pain, discomfort, or fever as directed by your caregiver.  Rest the joint as much as possible. When in bed, keep sheets and blankets off painful areas.  Keep the affected joint raised (elevated).  Use crutches if the painful joint is in your leg.  Drink enough water and fluids to keep your urine clear or pale yellow. This helps your body get rid of uric acid. Do not drink alcoholic beverages. They slow the passage of uric acid.  Follow your caregiver's dietary instructions. Pay careful attention to the amount of protein you eat. Your daily diet should emphasize fruits, vegetables, whole grains, and fat-free or low-fat milk products.  Maintain a healthy body weight. SEEK MEDICAL CARE IF:   You have an oral temperature above 102 F (38.9 C).  You develop diarrhea, vomiting, or any side effects from medicines.  You do not feel better in 24 hours, or you are getting worse. SEEK IMMEDIATE MEDICAL CARE IF:   Your joint becomes suddenly more tender and you have:  Chills.  An oral temperature above 102 F (38.9 C), not controlled by medicine. MAKE SURE YOU:   Understand these instructions.  Will watch your condition.  Will get help right away if you are not doing well or get worse. Document Released: 02/07/2000 Document Revised: 05/04/2011  Document Reviewed: 05/20/2009 [facility] Patient Information 2014 [place], [place].   Lyme Disease You may have been bitten by a tick and are to watch for the development of Lyme Disease. Lyme Disease is an infection that is caused by a bacteria The bacteria causing this disease is named Borreilia burgdorferi. If a tick is infected with this bacteria and then bites you, then Lyme Disease may occur. These ticks are carried by deer and rodents such as rabbits and mice and infest grassy as well as forested areas. Fortunately most tick bites do not cause Lyme Disease.  Lyme Disease is easier to prevent than to treat. First, covering your legs with clothing when walking in areas where ticks are possibly abundant will prevent their attachment because ticks tend to stay within inches of the ground. Second, using insecticides containing DEET can be applied on skin or clothing. Last, because it takes about 12 to 24 hours for the tick to transmit the disease after attachment to the human host, you should inspect your body for ticks twice a day when you are in areas where Lyme Disease is common. You must look thoroughly when searching for ticks. The Ixodes tick that carries Lyme Disease is very small. It is around the size of  a sesame seed (picture of tick is not actual size). Removal is best done by grasping the tick by the head and pulling it out. Do not to squeeze the body of the tick. This could inject the infecting bacteria into the bite site. Wash the area of the bite with an antiseptic solution after removal.  Lyme Disease is a disease that may affect many body systems. Because of the small size of the biting tick, most people do not notice being bitten. The first sign of an infection is usually a round red rash that extends out from the center of the tick bite. The center of the lesion may be blood colored (hemorrhagic) or have tiny blisters (vesicular). Most lesions have bright red outer borders and partial  central clearing. This rash may extend out many inches in diameter, and multiple lesions may be present. Other symptoms such as fatigue, headaches, chills and fever, general achiness and swelling of lymph glands may also occur. If this first stage of the disease is left untreated, these symptoms may gradually resolve by themselves, or progressive symptoms may occur because of spread of infection to other areas of the body.  Follow up with your caregiver to have testing and treatment if you have a tick bite and you develop any of the above complaints. Your caregiver may recommend preventative (prophylactic) medications which kill bacteria (antibiotics). Once a diagnosis of Lyme Disease is made, antibiotic treatment is highly likely to cure the disease. Effective treatment of late stage Lyme Disease may require longer courses of antibiotic therapy.  MAKE SURE YOU:   Understand these instructions.  Will watch your condition.  Will get help right away if you are not doing well or get worse. Document Released: 05/18/2000 Document Revised: 05/04/2011 Document Reviewed: 07/20/2008 [facility] Patient Information 2014 [place], [place].

## 2012-11-29 ENCOUNTER — Telehealth: Payer: Self-pay | Admitting: *Deleted

## 2012-11-29 LAB — COMPREHENSIVE METABOLIC PANEL
Alkaline Phosphatase: 62 U/L (ref 39–117)
BUN: 12 mg/dL (ref 6–23)
CO2: 25 mEq/L (ref 19–32)
Creatinine, Ser: 0.7 mg/dL (ref 0.4–1.5)
GFR: 112.38 mL/min (ref >60.00)
Glucose, Bld: 96 mg/dL (ref 70–99)
Total Bilirubin: 0.3 mg/dL (ref 0.3–1.2)
Total Protein: 7.3 g/dL (ref 6.0–8.3)

## 2012-11-29 LAB — URIC ACID: Uric Acid, Serum: 5 mg/dL (ref 4.0–7.8)

## 2012-11-29 NOTE — Telephone Encounter (Signed)
Left msg on triage requesting labs & xray results...Raechel Chute

## 2012-11-30 ENCOUNTER — Telehealth: Payer: Self-pay

## 2012-11-30 NOTE — Telephone Encounter (Signed)
Phone call from patient's wife requesting the results of lime disease test, she states it can not be seen on mychart. Also the skin has split on his foot where it was swollen. Please advise. He still has foot swelling and swelling on his wrist should he take another round of the gout med? They are going out of town this afternoon. Please advise.

## 2012-11-30 NOTE — Telephone Encounter (Signed)
Lyme titre not back. For foot - if red (redder), hot, and exquisitely painful can try colchicine. Otherwise routine NSAID Open wound - foot soak water and betadiene, rinse, dry and cover with no n-stick gauze dressing.   Called patient with instructions.

## 2012-12-08 ENCOUNTER — Ambulatory Visit (INDEPENDENT_AMBULATORY_CARE_PROVIDER_SITE_OTHER)
Admission: RE | Admit: 2012-12-08 | Discharge: 2012-12-08 | Disposition: A | Discharge status: Home / Self Care | Payer: Medicare Other | Source: Ambulatory Visit | Attending: Internal Medicine | Admitting: Internal Medicine

## 2012-12-08 ENCOUNTER — Ambulatory Visit (INDEPENDENT_AMBULATORY_CARE_PROVIDER_SITE_OTHER): Payer: Medicare Other | Admitting: Internal Medicine

## 2012-12-08 ENCOUNTER — Encounter: Payer: Self-pay | Admitting: Internal Medicine

## 2012-12-08 VITALS — BP 144/90 | HR 98 | Temp 98.3°F | Wt 185.8 lb

## 2012-12-08 DIAGNOSIS — M79641 Pain in right hand: Secondary | ICD-10-CM

## 2012-12-08 DIAGNOSIS — M25531 Pain in right wrist: Secondary | ICD-10-CM

## 2012-12-08 DIAGNOSIS — M25539 Pain in unspecified wrist: Secondary | ICD-10-CM

## 2012-12-08 DIAGNOSIS — M79609 Pain in unspecified limb: Secondary | ICD-10-CM

## 2012-12-08 DIAGNOSIS — M109 Gout, unspecified: Secondary | ICD-10-CM

## 2012-12-08 MED ORDER — DOXYCYCLINE HYCLATE 100 MG PO TABS: 100.0000 mg | ORAL_TABLET | Freq: Two times a day (BID) | ORAL | Status: DC

## 2012-12-08 NOTE — Patient Instructions (Signed)
1. Gout great toe right - there was a large amount of tophus - gout crystals - which come out as chunky,curdy material. There is now redness and heat to the joint and there is drainage that looks like gout crystals and pus.  Plan Doxycycline twice a day - an antibiotic  Keep a dry dressing on the foot.  It is ok to soak in warm water with betadiene  See Dr. Aldean Baker tomorrow - call [phone] this PM for an appointment time. They are expecting your call: you should say I spoke with Dr. Lajoyce Corners and he wants to see you tomorrow.  I have a lot of confidence in Dr. Audrie Lia clinical judgement and skill as a foot surgeon.  2. Hand and wrist pain - will check x-ray today to determine if it is gout (not likely) vs arthritis.

## 2012-12-11 NOTE — Assessment & Plan Note (Signed)
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 Concern now is for progressive infection and possible osteomyelitis.  Plan Discussed with Dr. Lajoyce Corners who will see patient Oct 17th  Will start doxycycline 100 mg bid for MRSA coverage

## 2012-12-11 NOTE — Progress Notes (Signed)
  Subjective:    Patient ID: Stephen Meza, male    DOB: June 23, 1933, 79 y.o.   MRN: [id number]  HPI Stephen Meza for follow up of Stephen Meza inflammation. 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 Stephen MCP joint and surrounding tissue were red, warm and fluctuant. Stephen Meza was coming down and he was instructed in Stephen use of colchicine. Uric acid level was 5.0 so he is not really a candidate for allopurinol. [place] and while there there was drainage (?manipulated) from Stephen area of Stephen MTP - described as thick, white and curdy constant with tophus material. There has been continued drainage of tophus type material and sanguinous fluid. Stephen area is continued to be warm and a bit painful.  On Oct 6th he was also having Stephen Meza and Meza at [facility] Me[facility]. This has continued w/o erythema, calore or and swelling.  PMH, FamHx and SocHx reviewed for any changes and relevance.  Current Outpatient Prescriptions on File Prior to Visit  Medication Sig Dispense Refill  . allopurinol (ZYLOPRIM) 100 MG tablet Take 1 tablet (100 mg total) by mouth daily.  30 tablet  11  . colchicine 0.6 MG tablet TAKE 2 TABLETS AT Stephen START OF GOUT FLARE.MAY REPEAT 1 TAB 2 HOURS LATER. LIMIT 3 TABS IN 24 HOURS.  9 tablet  3  . cyanocobalamin (,VITAMIN B-12,) 1000 MCG/ML injection Inject 1 mL (1,000 mcg total) into Stephen muscle once.  1 mL  11  . docusate sodium (COLACE) 100 MG capsule Take 100 mg by mouth once.      . furosemide (LASIX) 40 MG tablet       . losartan (COZAAR) 50 MG tablet       . omeprazole (PRILOSEC) 40 MG capsule Take 1 capsule (40 mg total) by mouth daily.  30 capsule  6   No current facility-administered medications on file prior to visit.      Review of Systems System review is negative for any constitutional, cardiac, pulmonary, GI or neuro symptoms or complaints other than as described in Stephen HPI.     Objective:    Physical Exam Filed Vitals:   12/08/12 1407  BP: 144/90  Pulse: 98  Temp: 98.3 F (36.8 C)   Gen'l- WNWD man in no distress. HEENT- C&S clear Cor- RRR Pulm - normal Ext - Stephen Meza, especially at Stephen MTP joint, is red and warm. There are two visible sinus tracts that are deep when explored with sterile q-tip. There is some sero-sanguinous fluid and some purulent appearing fluid.        Assessment & Plan:  Stephen Meza- x-ray ordered to r/o gout, establish dx of DJD.  X-ray : FINDINGS:  There are osteoarthritic changes of Stephen interphalangeal joints of  Stephen fingers particularly Stephen DIP joint of Stephen little finger. There  are no erosions to suggest gout. There is fairly severe  osteoarthritis of Stephen 1st carpal metacarpal joint.  There is widening Stephen scapholunate space consistent with a chronic  scapholunate tear. There is also arthritis between Stephen distal  scaphoid and Stephen trapezium and trapezoid. Minimal degenerative  changes of Stephen 5th and 1st metacarpal phalangeal joints.  IMPRESSION:  Osteoarthritis. No radiographic evidence suggestive of gout.  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

## 2012-12-12 ENCOUNTER — Encounter: Payer: Self-pay | Admitting: Internal Medicine

## 2012-12-13 ENCOUNTER — Encounter: Payer: Self-pay | Admitting: Internal Medicine

## 2012-12-13 ENCOUNTER — Other Ambulatory Visit: Payer: Medicare Other

## 2012-12-13 ENCOUNTER — Ambulatory Visit (INDEPENDENT_AMBULATORY_CARE_PROVIDER_SITE_OTHER): Payer: Medicare Other | Admitting: Internal Medicine

## 2012-12-13 ENCOUNTER — Telehealth: Payer: Self-pay

## 2012-12-13 VITALS — BP 140/86 | HR 94 | Temp 98.3°F | Wt 184.4 lb

## 2012-12-13 DIAGNOSIS — M19039 Primary osteoarthritis, unspecified wrist: Secondary | ICD-10-CM

## 2012-12-13 DIAGNOSIS — C61 Malignant neoplasm of prostate: Secondary | ICD-10-CM

## 2012-12-13 DIAGNOSIS — M109 Gout, unspecified: Secondary | ICD-10-CM

## 2012-12-13 DIAGNOSIS — IMO0001 Reserved for inherently not codable concepts without codable children: Secondary | ICD-10-CM

## 2012-12-13 DIAGNOSIS — M19031 Primary osteoarthritis, right wrist: Secondary | ICD-10-CM

## 2012-12-13 NOTE — Telephone Encounter (Signed)
Clydie Braun from the lab downstairs called ext [phone] stating patient is coming back tomorrow to have the lab test re-done since the original order put in was not a future order so it could not be seen. I put the new order in as future.

## 2012-12-14 LAB — LYME, TOTAL AB TEST/REFLEX: Lyme IgG/IgM Ab: <0.91 {ISR} (ref 0.00–0.90)

## 2012-12-15 DIAGNOSIS — M19031 Primary osteoarthritis, right wrist: Secondary | ICD-10-CM | POA: Insufficient documentation

## 2012-12-15 NOTE — Assessment & Plan Note (Signed)
Currently under the care of Dr. Lajoyce Corners - on dressing/wound care and antibiotic. Improving. Will hopefully keep his great toe. Did review x-ray images with patient and his wife.

## 2012-12-15 NOTE — Progress Notes (Signed)
  Subjective:    Patient ID: Stephen Meza, male    DOB: 01/21/1934, 79 y.o.   MRN: [id number]  HPI Stephen Meza returns for follow up of podagra and right hand and wrist pain. In the interval he has seen Dr. Lajoyce Corners twice and is continuing on antibiotics and dressing changes. He reports improvement in swelling and decrease in drainage. He is hopeful that he will not need surgery.  He continues to have right wrist and finger pain.  He reports that since February he has been taking hormonal therapy for low grade prostate cancer.  PMH, FamHx and SocHx reviewed for any changes and relevance.  Current Outpatient Prescriptions on File Prior to Visit  Medication Sig Dispense Refill  . allopurinol (ZYLOPRIM) 100 MG tablet Take 1 tablet (100 mg total) by mouth daily.  30 tablet  11  . colchicine 0.6 MG tablet TAKE 2 TABLETS AT THE START OF GOUT FLARE.MAY REPEAT 1 TAB 2 HOURS LATER. LIMIT 3 TABS IN 24 HOURS.  9 tablet  3  . cyanocobalamin (,VITAMIN B-12,) 1000 MCG/ML injection Inject 1 mL (1,000 mcg total) into the muscle once.  1 mL  11  . docusate sodium (COLACE) 100 MG capsule Take 100 mg by mouth once.      . doxycycline (VIBRA-TABS) 100 MG tablet Take 1 tablet (100 mg total) by mouth 2 (two) times daily.  20 tablet  0  . furosemide (LASIX) 40 MG tablet       . losartan (COZAAR) 50 MG tablet       . omeprazole (PRILOSEC) 40 MG capsule Take 1 capsule (40 mg total) by mouth daily.  30 capsule  6   No current facility-administered medications on file prior to visit.      Review of Systems System review is negative for any constitutional, cardiac, pulmonary, GI or neuro symptoms or complaints other than as described in the HPI.     Objective:   Physical Exam Filed Vitals:   12/13/12 1655  BP: 140/86  Pulse: 94  Temp: 98.3 F (36.8 C)   gen'l - WNWD man in no distress Ext - right foot in ace-wrap dressing  Right hand w/o swelling, tender to palpation at the lateral  wrist.       Assessment & Plan:

## 2012-12-15 NOTE — Assessment & Plan Note (Signed)
Reviewed x-ray report and viewed images with the patient and his wife. Reviewed anatomy texts for better illustration. Explained the ligamentous injury that is probably old. He also has minor arthritic changes metacarpal-phalangeal joints, especially right 5th digit.  Plan NSAID of choice.

## 2012-12-15 NOTE — Assessment & Plan Note (Signed)
Patient "confesses" that he is having hormonal treatment for prostate cancer. He has had mild gynecomastia and decreased libido with ED.

## 2012-12-29 ENCOUNTER — Other Ambulatory Visit: Payer: Self-pay

## 2013-01-02 ENCOUNTER — Telehealth: Payer: Self-pay | Admitting: Internal Medicine

## 2013-01-02 ENCOUNTER — Other Ambulatory Visit: Payer: Self-pay | Admitting: Internal Medicine

## 2013-01-02 NOTE — Telephone Encounter (Signed)
Pt wants to know why the Cyanocobalamin (B-12) came in a small one month size.  He has been getting a larger container that held 10-12 doses.

## 2013-01-03 MED ORDER — CYANOCOBALAMIN 1000 MCG/ML IJ SOLN: INTRAMUSCULAR | Status: DC

## 2013-01-03 NOTE — Telephone Encounter (Signed)
Pt informed

## 2013-01-03 NOTE — Telephone Encounter (Signed)
Don't know: error vs insurance limitation. New Rx sent for 10 ml bottle

## 2013-01-16 ENCOUNTER — Telehealth: Payer: Self-pay

## 2013-01-16 ENCOUNTER — Telehealth: Payer: Self-pay | Admitting: Internal Medicine

## 2013-01-16 MED ORDER — ALLOPURINOL 100 MG PO TABS: 100.0000 mg | ORAL_TABLET | Freq: Every day | ORAL | Status: DC

## 2013-01-16 MED ORDER — LOSARTAN POTASSIUM 50 MG PO TABS: 50.0000 mg | ORAL_TABLET | Freq: Every day | ORAL | Status: DC

## 2013-01-16 MED ORDER — FUROSEMIDE 40 MG PO TABS: 40.0000 mg | ORAL_TABLET | Freq: Every day | ORAL | Status: DC

## 2013-01-16 MED ORDER — OMEPRAZOLE 40 MG PO CPDR: 40.0000 mg | DELAYED_RELEASE_CAPSULE | Freq: Every day | ORAL | Status: DC

## 2013-01-16 NOTE — Telephone Encounter (Signed)
Prescription(s) have been sent

## 2013-01-16 NOTE — Telephone Encounter (Signed)
Talked with the pharmacy at [facility] in [place] on [address] and was advised the 10 ml bottle is not available so patient will be getting 1 ml bottles each month.

## 2013-01-16 NOTE — Telephone Encounter (Signed)
Mr Fertig wants to start getting his medications from [facility] for 90 day supplies.  The fax number is [phone].  He needs RX's sent for: Furosemide 40mg , Allopurinol 100mg , Omeprazole DR 40mg , Losartan Potassium 50mg 

## 2013-03-06 ENCOUNTER — Other Ambulatory Visit: Payer: Self-pay | Admitting: Internal Medicine

## 2013-03-07 ENCOUNTER — Other Ambulatory Visit: Payer: Self-pay

## 2013-03-07 ENCOUNTER — Telehealth: Payer: Self-pay | Admitting: Internal Medicine

## 2013-03-07 MED ORDER — CYANOCOBALAMIN 1000 MCG/ML IJ SOLN: 1000.0000 ug | INTRAMUSCULAR | Status: DC

## 2013-03-07 NOTE — Telephone Encounter (Signed)
Mrs. Goynes wants to be sure the B-12 RX for Stephen Meza is a 90 day supply so they can refill every 3 mo. His toe and the gout is better.

## 2013-03-07 NOTE — Telephone Encounter (Signed)
Prescription has already been sent 

## 2013-04-04 ENCOUNTER — Other Ambulatory Visit: Payer: Self-pay

## 2013-05-01 ENCOUNTER — Other Ambulatory Visit (INDEPENDENT_AMBULATORY_CARE_PROVIDER_SITE_OTHER): Payer: Medicare Other

## 2013-05-01 ENCOUNTER — Ambulatory Visit (INDEPENDENT_AMBULATORY_CARE_PROVIDER_SITE_OTHER): Payer: Medicare Other | Admitting: Internal Medicine

## 2013-05-01 ENCOUNTER — Encounter: Payer: Self-pay | Admitting: Internal Medicine

## 2013-05-01 ENCOUNTER — Ambulatory Visit (INDEPENDENT_AMBULATORY_CARE_PROVIDER_SITE_OTHER)
Admission: RE | Admit: 2013-05-01 | Discharge: 2013-05-01 | Disposition: A | Discharge status: Home / Self Care | Payer: Medicare Other | Source: Ambulatory Visit | Attending: Internal Medicine | Admitting: Internal Medicine

## 2013-05-01 VITALS — BP 146/80 | HR 64 | Temp 98.0°F | Ht 72.0 in | Wt 201.2 lb

## 2013-05-01 DIAGNOSIS — I10 Essential (primary) hypertension: Secondary | ICD-10-CM

## 2013-05-01 DIAGNOSIS — E7800 Pure hypercholesterolemia, unspecified: Secondary | ICD-10-CM

## 2013-05-01 DIAGNOSIS — M109 Gout, unspecified: Secondary | ICD-10-CM

## 2013-05-01 DIAGNOSIS — E538 Deficiency of other specified B group vitamins: Secondary | ICD-10-CM

## 2013-05-01 DIAGNOSIS — M19031 Primary osteoarthritis, right wrist: Secondary | ICD-10-CM

## 2013-05-01 DIAGNOSIS — Z0000 Encounter for general adult medical examination without abnormal findings: Secondary | ICD-10-CM

## 2013-05-01 DIAGNOSIS — K219 Gastro-esophageal reflux disease without esophagitis: Secondary | ICD-10-CM

## 2013-05-01 DIAGNOSIS — M19039 Primary osteoarthritis, unspecified wrist: Secondary | ICD-10-CM

## 2013-05-01 DIAGNOSIS — C61 Malignant neoplasm of prostate: Secondary | ICD-10-CM

## 2013-05-01 DIAGNOSIS — Z23 Encounter for immunization: Secondary | ICD-10-CM

## 2013-05-01 LAB — LIPID PANEL
CHOLESTEROL: 237 mg/dL — AB (ref 0–200)
HDL: 45.6 mg/dL (ref >39.00)
LDL Cholesterol: 162 mg/dL — ABNORMAL HIGH (ref 0–99)
Total CHOL/HDL Ratio: 5
Triglycerides: 147 mg/dL (ref 0.0–149.0)
VLDL: 29.4 mg/dL (ref 0.0–40.0)

## 2013-05-01 LAB — BASIC METABOLIC PANEL
BUN: 15 mg/dL (ref 6–23)
CALCIUM: 9.7 mg/dL (ref 8.4–10.5)
CO2: 30 mEq/L (ref 19–32)
CREATININE: 0.9 mg/dL (ref 0.4–1.5)
Chloride: 103 mEq/L (ref 96–112)
GFR: 89.05 mL/min (ref >60.00)
Glucose, Bld: 103 mg/dL — ABNORMAL HIGH (ref 70–99)
Potassium: 5 mEq/L (ref 3.5–5.1)
Sodium: 140 mEq/L (ref 135–145)

## 2013-05-01 LAB — VITAMIN B12: VITAMIN B 12: 281 pg/mL (ref 211–911)

## 2013-05-01 MED ORDER — DICLOFENAC SODIUM 1 % TD GEL: 2.0000 g | Freq: Four times a day (QID) | TRANSDERMAL | Status: DC

## 2013-05-01 NOTE — Patient Instructions (Signed)
Good to see you.  You exam is fine: lungs clear, heart regular, no evidence carotid disease, chest is ok, no fluid build up legs. Great toe enlarge but not inflammed.  Health maintenance - shots are current but you do need prevnar today. You are current with colon cancer screening.  Gout - will recheck foot x-ray today.  General lab work: will check kidney, sugar, B12, cholesterol today.

## 2013-05-01 NOTE — Progress Notes (Signed)
Subjective:    Patient ID: Stephen Meza, male    DOB: [birth year], 80 y.o.   MRN: [id number]  HPI The patient is here for annual Medicare wellness examination and management of other chronic and acute problems.  In regard to follow up of podegra: he saw Dr. Sharol Given in February and he is doing well. He has transitioned to allopurinol.   He continues to take lupron injections for low grade prostate cancer with pretreatment PSA of 8. He has had mild gynecomastia, complete impotence. His last PSA is zero.   The risk factors are reflected in the social history.  The roster of all physicians providing medical care to patient - is listed in the Snapshot section of the chart.  Activities of daily living:  The patient is 100% inedpendent in all ADLs: dressing, toileting, feeding as well as independent mobility  Home safety : The patient has smoke detectors in the home. They wear seatbelts. firearms are present in the home, kept in a safe fashion. There is no violence in the home.   There is no risks for hepatitis, STDs or HIV. There is no   history of blood transfusion. They have no travel history to infectious disease endemic areas of the world.  The patient has seen their dentist in the last six month. They have seen their eye doctor in the last year. They deny any hearing difficulty and have not had audiologic testing in the last year.    They do not  have excessive sun exposure. Discussed the need for sun protection: hats, long sleeves and use of sunscreen if there is significant sun exposure.   Diet: the importance of a healthy diet is discussed. They do have a healthy diet.  The patient has a regular exercise program: walking (once his foot allowed it) , 30 min duration, 3-5 days per week.  The benefits of regular aerobic exercise were discussed.  Depression screen: there are no signs or vegative symptoms of depression- irritability, change in appetite, anhedonia,  sadness/tearfullness.  Cognitive assessment: the patient manages all their financial and personal affairs and is actively engaged.   The following portions of the patient's history were reviewed and updated as appropriate: allergies, current medications, past family history, past medical history,  past surgical history, past social history  and problem list.  Vision, hearing, body mass index were assessed and reviewed.   During the course of the visit the patient was educated and counseled about appropriate screening and preventive services including : fall prevention , diabetes screening, nutrition counseling, colorectal cancer screening, and recommended immunizations.  Past Medical History  Diagnosis Date  . DUODENITIS, WITHOUT HEMORRHAGE 07/14/2007  . ERECTILE DYSFUNCTION 07/14/2007  . ESOPHAGITIS 07/14/2007  . EXTERNAL HEMORRHOIDS 07/14/2007  . GERD 07/14/2007  . HYPERTENSION 07/14/2007  . PERIPHERAL NEUROPATHY 07/14/2007  . PERIPHERAL VASCULAR DISEASE 07/14/2007  . PODAGRA 07/14/2007  . TOBACCO ABUSE, HX OF 02/12/2010  . TONSILLECTOMY, HX OF 07/14/2007  . TREMOR, ESSENTIAL 07/14/2007  . VITAMIN B12 DEFICIENCY 07/14/2007  . Gout   . Prostate cancer 02/2012    pt had screening and then biopsy   Past Surgical History  Procedure Laterality Date  . Excision of lipoma    . Surgery to reduce turbinate and straighten deviated septum    . Tonsillectomy    . Prostate biopsy Bilateral 03/10/2012   Family History  Problem Relation Age of Onset  . Hypothyroidism Daughter   . Breast cancer Other    History  Social History  . Marital Status: Married    Spouse Name: N/A    Number of Children: 2  . Years of Education: N/A   Occupational History  . retired    Social History Main Topics  . Smoking status: Former Smoker    Quit date: 02/23/1994  . Smokeless tobacco: Former Systems developer  . Alcohol Use: 18.0 oz/week    30 Cans of beer per week  . Drug Use: No  . Sexual Activity: Yes     Partners: Female   Other Topics Concern  . Not on file   Social History Narrative   HSG. Married '1958. 2 dtrs; 4 g-children. Work - retired. 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           Current Outpatient Prescriptions on File Prior to Visit  Medication Sig Dispense Refill  . allopurinol (ZYLOPRIM) 100 MG tablet Take 1 tablet (100 mg total) by mouth daily.  90 tablet  3  . colchicine 0.6 MG tablet TAKE 2 TABLETS AT THE START OF GOUT FLARE.MAY REPEAT 1 TAB 2 HOURS LATER. LIMIT 3 TABS IN 24 HOURS.  9 tablet  3  . cyanocobalamin (,VITAMIN B-12,) 1000 MCG/ML injection Inject 1 mL (1,000 mcg total) into the muscle every 30 (thirty) days.  30 mL  3  . docusate sodium (COLACE) 100 MG capsule Take 100 mg by mouth once.      . doxycycline (VIBRA-TABS) 100 MG tablet Take 1 tablet (100 mg total) by mouth 2 (two) times daily.  20 tablet  0  . furosemide (LASIX) 40 MG tablet Take 1 tablet (40 mg total) by mouth daily.  90 tablet  3  . losartan (COZAAR) 50 MG tablet Take 1 tablet (50 mg total) by mouth daily.  90 tablet  3  . omeprazole (PRILOSEC) 40 MG capsule Take 1 capsule (40 mg total) by mouth daily.  90 capsule  3   No current facility-administered medications on file prior to visit.        Review of Systems Constitutional:  Negative for fever, chills, activity change and unexpected weight change.  HEENT:  Negative for hearing loss, ear pain, congestion, neck stiffness and postnasal drip. Negative for sore throat or swallowing problems. Negative for dental complaints.   Eyes: Negative for vision loss or change in visual acuity.  Respiratory: Negative for chest tightness and wheezing. Negative for DOE.   Cardiovascular: Negative for chest pain or palpitations. No decreased exercise tolerance Gastrointestinal: No change in bowel habit. No bloating or gas. No reflux or indigestion Genitourinary: Negative for urgency,  frequency, flank pain and difficulty urinating.  Musculoskeletal: Negative for myalgias, back pain, arthralgias and gait problem. Right great toe remains enlarged but no severe pain. Neurological: Negative for dizziness, tremors, weakness and headaches.  Hematological: Negative for adenopathy.  Psychiatric/Behavioral: Negative for behavioral problems and dysphoric mood.       Objective:   Physical Exam Filed Vitals:   05/01/13 1335  BP: 146/80  Pulse: 64  Temp: 98 F (36.7 C)   Wt Readings from Last 3 Encounters:  05/01/13 201 lb 3.2 oz (91.264 kg)  12/13/12 184 lb 6.4 oz (83.643 kg)  12/08/12 185 lb 12.8 oz (84.278 kg)   Gen'l: Well nourished well developed male in no acute distress  HEENT: Head: Normocephalic and atraumatic. Right Ear: External ear normal. EAC/TM nl. Left Ear: External ear normal.  EAC/TM nl. Nose: Nose  normal. Mouth/Throat: Oropharynx is clear and moist. Dentition - native, in good repair. No buccal or palatal lesions. Posterior pharynx clear. Eyes: Conjunctivae and sclera clear. EOM intact. Pupils are equal, round, and reactive to light. Right eye exhibits no discharge. Left eye exhibits no discharge. Neck: Normal range of motion. Neck supple. No JVD present. No tracheal deviation present. No thyromegaly present.  Cardiovascular: Normal rate, regular rhythm, no gallop, no friction rub, no murmur heard.      Quiet precordium. 2+ radial and DP pulses . No carotid bruits Pulmonary/Chest: Effort normal. No respiratory distress or increased WOB, no wheezes, no rales. No chest wall deformity or CVAT. Abdomen: Soft. Bowel sounds are normal in all quadrants. He exhibits no distension, no tenderness, no rebound or guarding, No heptosplenomegaly  Genitourinary:  deferred to GU Musculoskeletal: Normal range of motion. He exhibits no edema and no tenderness.       Small and large joints without redness, synovial thickening or deformity except for enlarged MTP 1st right. Full  range of motion preserved about all small, median and large joints.  Lymphadenopathy:    He has no cervical or supraclavicular adenopathy.  Neurological: He is alert and oriented to person, place, and time. CN II-XII intact. DTRs 2+ and symmetrical biceps, radial and patellar tendons. Cerebellar function normal with no tremor, rigidity, normal gait and station.  Skin: Skin is warm and dry. No rash noted. No erythema.  Psychiatric: He has a normal mood and affect. His behavior is normal. Thought content normal.   Recent Results (from the past 2160 hour(s))  BASIC METABOLIC PANEL     Status: Abnormal   Collection Time    05/01/13  2:34 PM      Result Value Ref Range   Sodium 140  135 - 145 mEq/L   Potassium 5.0  3.5 - 5.1 mEq/L   Chloride 103  96 - 112 mEq/L   CO2 30  19 - 32 mEq/L   Glucose, Bld 103 (*) 70 - 99 mg/dL   BUN 15  6 - 23 mg/dL   Creatinine, Ser 0.9  0.4 - 1.5 mg/dL   Calcium 9.7  8.4 - 10.5 mg/dL   GFR 89.05  >60.00 mL/min  VITAMIN B12     Status: None   Collection Time    05/01/13  2:34 PM      Result Value Ref Range   Vitamin B-12 281  211 - 911 pg/mL  LIPID PANEL     Status: Abnormal   Collection Time    05/01/13  2:34 PM      Result Value Ref Range   Cholesterol 237 (*) 0 - 200 mg/dL   Comment: ATP III Classification       Desirable:  < 200 mg/dL               Borderline High:  200 - 239 mg/dL          High:  > = 240 mg/dL   Triglycerides 147.0  0.0 - 149.0 mg/dL   Comment: Normal:  <150 mg/dLBorderline High:  150 - 199 mg/dL   HDL 45.60  >39.00 mg/dL   VLDL 29.4  0.0 - 40.0 mg/dL   LDL Cholesterol 162 (*) 0 - 99 mg/dL   Total CHOL/HDL Ratio 5     Comment:                Men          Women1/2 Average Risk  3.4          3.3Average Risk          5.0          4.42X Average Risk          9.6          7.13X Average Risk          15.0          11.0                             Assessment & Plan:

## 2013-05-01 NOTE — Progress Notes (Signed)
Pre visit review using our clinic review tool, if applicable. No additional management support is needed unless otherwise documented below in the visit note. 

## 2013-05-02 NOTE — Assessment & Plan Note (Signed)
BP Readings from Last 3 Encounters:  05/01/13 146/80  12/13/12 140/86  12/08/12 144/90   Adequate control on present medications. Bmet is normal  Plan Continue present regimen

## 2013-05-02 NOTE — Assessment & Plan Note (Signed)
Interval history notable for severe podagra and prostate cancer but he is doing well with both problems. Limited physical exam is ok. He is current with colorectal cancer screening. Lab results reviewed: elevated LDL but otherwise normal. Immunizations are up to date and Prevnar is given today.  In summary A very nice man who is medically stable at this time.

## 2013-05-02 NOTE — Assessment & Plan Note (Signed)
B12 @ 281 is in normal range.

## 2013-05-02 NOTE — Assessment & Plan Note (Signed)
Variable LDL now at treatment threshold.  Plan Trial of diet with repeat lab in 3 months

## 2013-05-02 NOTE — Assessment & Plan Note (Signed)
Continues on hormonal therapy. He is satisfied with treatment and has minimal and manageable side effects: gynecomastia and impotence.

## 2013-05-02 NOTE — Assessment & Plan Note (Signed)
He has done very well with treatment under the direction of Dr. Sharol Given with preservation of his toe. He is now on chronic allopurinol with a recent uric acid level at Dr. Jess Barters office. He does request a follow up x-ray of the foot today.   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 No acute abnormality is noted.  Plan Continue allopurinol.  Colchicine for acute pain.

## 2013-05-02 NOTE — Assessment & Plan Note (Signed)
Reviewed x-ray report and images with patient illustrating the arthritic changes and lack of gout related changes. He does have mild limitation in ROM of the wrist.

## 2013-05-02 NOTE — Assessment & Plan Note (Signed)
No active complaints. He continues to use H2 blocker, more prn that daily.

## 2013-05-03 ENCOUNTER — Other Ambulatory Visit: Payer: Self-pay | Admitting: Internal Medicine

## 2013-05-03 DIAGNOSIS — R899 Unspecified abnormal finding in specimens from other organs, systems and tissues: Secondary | ICD-10-CM

## 2013-05-29 NOTE — Progress Notes (Signed)
   Subjective:    Patient ID: Stephen Meza, male    DOB: Aug 02, 1933, 81 y.o.   MRN: [id number]  HPI Stephen Meza presents with acute pain in ankle and wrist c/w gout flare. With has various myalgias and arthralgias Mrs. Kneisel is concerned for possible Lyme's disease - he does have risk of tick exposure.  Past Medical History  Diagnosis Date  . DUODENITIS, WITHOUT HEMORRHAGE 07/14/2007  . ERECTILE DYSFUNCTION 07/14/2007  . ESOPHAGITIS 07/14/2007  . EXTERNAL HEMORRHOIDS 07/14/2007  . GERD 07/14/2007  . HYPERTENSION 07/14/2007  . PERIPHERAL NEUROPATHY 07/14/2007  . PERIPHERAL VASCULAR DISEASE 07/14/2007  . PODAGRA 07/14/2007  . TOBACCO ABUSE, HX OF 02/12/2010  . TONSILLECTOMY, HX OF 07/14/2007  . TREMOR, ESSENTIAL 07/14/2007  . VITAMIN B12 DEFICIENCY 07/14/2007  . Gout   . Prostate cancer 02/2012    pt had screening and then biopsy   Past Surgical History  Procedure Laterality Date  . Excision of lipoma    . Surgery to reduce turbinate and straighten deviated septum    . Tonsillectomy    . Prostate biopsy Bilateral 03/10/2012   Family History  Problem Relation Age of Onset  . Hypothyroidism Daughter   . Breast cancer Other    History   Social History  . Marital Status: Married    Spouse Name: N/A    Number of Children: 2  . Years of Education: N/A   Occupational History  . retired    Social History Main Topics  . Smoking status: Former Smoker    Quit date: 02/23/1994  . Smokeless tobacco: Former Systems developer  . Alcohol Use: 18.0 oz/week    30 Cans of beer per week  . Drug Use: No  . Sexual Activity: Yes    Partners: Female   Other Topics Concern  . Not on file   Social History Narrative   HSG. Married '1958. 2 dtrs; 4 g-children. Work - retired. 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            Current Outpatient Prescriptions on File Prior to Visit  Medication Sig Dispense Refill  . colchicine 0.6  MG tablet TAKE 2 TABLETS AT THE START OF GOUT FLARE.MAY REPEAT 1 TAB 2 HOURS LATER. LIMIT 3 TABS IN 24 HOURS.  9 tablet  3  . docusate sodium (COLACE) 100 MG capsule Take 100 mg by mouth once.       No current facility-administered medications on file prior to visit.       Review of Systems System review is negative for any constitutional, cardiac, pulmonary, GI or neuro symptoms or complaints other than as described in the HPI.     Objective:   Physical Exam Filed Vitals:   11/28/12 1629  BP: 148/90  Pulse: 86  Temp: 99.1 F (37.3 C)   Gen'l- WNWD man in no distress HEENT_ C&S clear Cor - RRR Pulm - normal respirations MSK - erythematous and tender wrist and ankle.        Assessment & Plan:

## 2013-05-29 NOTE — Assessment & Plan Note (Signed)
probalbe gout flare.  Plan - colchicine for acute flares  Check Uric acid level to determine need for prophylaxis.

## 2013-06-15 ENCOUNTER — Other Ambulatory Visit: Payer: Self-pay

## 2013-11-20 ENCOUNTER — Telehealth: Payer: Self-pay | Admitting: Internal Medicine

## 2013-11-20 MED ORDER — OMEPRAZOLE 40 MG PO CPDR: 40.0000 mg | DELAYED_RELEASE_CAPSULE | Freq: Every day | ORAL | Status: DC

## 2013-11-20 NOTE — Telephone Encounter (Signed)
Called pt no answer LMOM rx sent to [facility]...Stephen Meza

## 2013-11-20 NOTE — Telephone Encounter (Signed)
Pt's wife has called and requested a refill on Omeprazole (Prilosec) 40 mg capsule.  Pharmacy info is [facility] [phone].  Pt's wife stated Dr. Linda Hedges set up the transfer of care with Dr. Jenny Reichmann.  Pt's wife will be calling to schedule yearly appts for pt & herself.

## 2014-02-23 HISTORY — PX: OTHER SURGICAL HISTORY: SHX169

## 2014-03-15 ENCOUNTER — Encounter: Payer: Self-pay | Admitting: Internal Medicine

## 2014-03-15 ENCOUNTER — Other Ambulatory Visit (INDEPENDENT_AMBULATORY_CARE_PROVIDER_SITE_OTHER): Payer: Medicare Other

## 2014-03-15 ENCOUNTER — Ambulatory Visit (INDEPENDENT_AMBULATORY_CARE_PROVIDER_SITE_OTHER): Payer: Medicare Other | Admitting: Internal Medicine

## 2014-03-15 VITALS — BP 140/92 | HR 79 | Temp 98.2°F | Ht 72.0 in | Wt 196.5 lb

## 2014-03-15 DIAGNOSIS — E785 Hyperlipidemia, unspecified: Secondary | ICD-10-CM

## 2014-03-15 DIAGNOSIS — Z0000 Encounter for general adult medical examination without abnormal findings: Secondary | ICD-10-CM

## 2014-03-15 LAB — CBC WITH DIFFERENTIAL/PLATELET
BASOS ABS: 0 10*3/uL (ref 0.0–0.1)
Basophils Relative: 0.7 % (ref 0.0–3.0)
Eosinophils Absolute: 0.1 10*3/uL (ref 0.0–0.7)
Eosinophils Relative: 2.2 % (ref 0.0–5.0)
HCT: 40.2 % (ref 39.0–52.0)
Hemoglobin: 13.8 g/dL (ref 13.0–17.0)
LYMPHS PCT: 19.4 % (ref 12.0–46.0)
Lymphs Abs: 1 10*3/uL (ref 0.7–4.0)
MCHC: 34.4 g/dL (ref 30.0–36.0)
MCV: 93.6 fl (ref 78.0–100.0)
MONO ABS: 0.6 10*3/uL (ref 0.1–1.0)
Monocytes Relative: 12.1 % — ABNORMAL HIGH (ref 3.0–12.0)
NEUTROS ABS: 3.5 10*3/uL (ref 1.4–7.7)
NEUTROS PCT: 65.6 % (ref 43.0–77.0)
Platelets: 190 10*3/uL (ref 150.0–400.0)
RBC: 4.3 Mil/uL (ref 4.22–5.81)
RDW: 13.9 % (ref 11.5–15.5)
WBC: 5.3 10*3/uL (ref 4.0–10.5)

## 2014-03-15 LAB — HEPATIC FUNCTION PANEL
ALBUMIN: 4 g/dL (ref 3.5–5.2)
ALT: 15 U/L (ref 0–53)
AST: 28 U/L (ref 0–37)
Alkaline Phosphatase: 98 U/L (ref 39–117)
Bilirubin, Direct: 0.2 mg/dL (ref 0.0–0.3)
TOTAL PROTEIN: 6.9 g/dL (ref 6.0–8.3)
Total Bilirubin: 0.6 mg/dL (ref 0.2–1.2)

## 2014-03-15 LAB — URINALYSIS, ROUTINE W REFLEX MICROSCOPIC
BILIRUBIN URINE: NEGATIVE
Hgb urine dipstick: NEGATIVE
Ketones, ur: NEGATIVE
Leukocytes, UA: NEGATIVE
Nitrite: NEGATIVE
PH: 6 (ref 5.0–8.0)
Specific Gravity, Urine: 1.025 (ref 1.000–1.030)
Total Protein, Urine: NEGATIVE
URINE GLUCOSE: NEGATIVE
Urobilinogen, UA: 0.2 (ref 0.0–1.0)

## 2014-03-15 LAB — LIPID PANEL
Cholesterol: 167 mg/dL (ref 0–200)
HDL: 47.1 mg/dL (ref >39.00)
NONHDL: 119.9
Total CHOL/HDL Ratio: 4
Triglycerides: 259 mg/dL — ABNORMAL HIGH (ref 0.0–149.0)
VLDL: 51.8 mg/dL — AB (ref 0.0–40.0)

## 2014-03-15 LAB — BASIC METABOLIC PANEL
BUN: 13 mg/dL (ref 6–23)
CALCIUM: 9.5 mg/dL (ref 8.4–10.5)
CO2: 28 mEq/L (ref 19–32)
Chloride: 100 mEq/L (ref 96–112)
Creatinine, Ser: 0.86 mg/dL (ref 0.40–1.50)
GFR: 91.24 mL/min (ref >60.00)
GLUCOSE: 122 mg/dL — AB (ref 70–99)
POTASSIUM: 4.3 meq/L (ref 3.5–5.1)
Sodium: 135 mEq/L (ref 135–145)

## 2014-03-15 LAB — TSH: TSH: 6.2 u[IU]/mL — ABNORMAL HIGH (ref 0.35–4.50)

## 2014-03-15 NOTE — Patient Instructions (Addendum)
Please continue all other medications as before, and refills have been done if requested.  Please have the pharmacy call with any other refills you may need.  Please continue your efforts at being more active, low cholesterol diet, and weight control.  You are otherwise up to date with prevention measures today.  Please keep your appointments with your specialists as you may have planned  You will be contacted regarding the referral for: colonoscopy  Please go to the LAB in the Basement (turn left off the elevator) for the tests to be done today  You will be contacted by phone if any changes need to be made immediately.  Otherwise, you will receive a letter about your results with an explanation, but please check with MyChart first.  Please remember to sign up for MyChart if you have not done so, as this will be important to you in the future with finding out test results, communicating by private email, and scheduling acute appointments online when needed.  Please return in 1 year for your yearly visit, or sooner if needed, with Lab testing done 3-5 days before  

## 2014-03-15 NOTE — Progress Notes (Signed)
Pre visit review using our clinic review tool, if applicable. No additional management support is needed unless otherwise documented below in the visit note. 

## 2014-03-15 NOTE — Progress Notes (Signed)
Subjective:    Patient ID: Stephen Meza, male    DOB: 1934-01-02, 81 y.o.   MRN: [id number]  HPI  Here for wellness and f/u;  Overall doing ok;  Pt denies CP, worsening SOB, DOE, wheezing, orthopnea, PND, worsening LE edema, palpitations, dizziness or syncope.  Pt denies neurological change such as new headache, facial or extremity weakness.  Pt denies polydipsia, polyuria, or low sugar symptoms. Pt states overall good compliance with treatment and medications, good tolerability, and has been trying to follow lower cholesterol diet.  Pt denies worsening depressive symptoms, suicidal ideation or panic. No fever, night sweats, wt loss, loss of appetite, or other constitutional symptoms.  Pt states good ability with ADL's, has low fall risk, home safety reviewed and adequate, no other significant changes in hearing or vision, and only occasionally active with exercise. Seeing urology [place] q 3 mo for lupron. No current complaints Past Medical History  Diagnosis Date  . DUODENITIS, WITHOUT HEMORRHAGE 07/14/2007  . ERECTILE DYSFUNCTION 07/14/2007  . ESOPHAGITIS 07/14/2007  . EXTERNAL HEMORRHOIDS 07/14/2007  . GERD 07/14/2007  . HYPERTENSION 07/14/2007  . PERIPHERAL NEUROPATHY 07/14/2007  . PERIPHERAL VASCULAR DISEASE 07/14/2007  . PODAGRA 07/14/2007  . TOBACCO ABUSE, HX OF 02/12/2010  . TONSILLECTOMY, HX OF 07/14/2007  . TREMOR, ESSENTIAL 07/14/2007  . VITAMIN B12 DEFICIENCY 07/14/2007  . Gout   . Prostate cancer 02/2012    pt had screening and then biopsy   Past Surgical History  Procedure Laterality Date  . Excision of lipoma    . Surgery to reduce turbinate and straighten deviated septum    . Tonsillectomy    . Prostate biopsy Bilateral 03/10/2012    reports that he quit smoking about 20 years ago. He has quit using smokeless tobacco. He reports that he drinks about 18.0 oz of alcohol per week. He reports that he does not use illicit drugs. family history includes Breast cancer in his  other; Hypothyroidism in his daughter. No Known Allergies Current Outpatient Prescriptions on File Prior to Visit  Medication Sig Dispense Refill  . allopurinol (ZYLOPRIM) 100 MG tablet Take 1 tablet (100 mg total) by mouth daily. 90 tablet 3  . colchicine 0.6 MG tablet TAKE 2 TABLETS AT THE START OF GOUT FLARE.MAY REPEAT 1 TAB 2 HOURS LATER. LIMIT 3 TABS IN 24 HOURS. 9 tablet 3  . cyanocobalamin (,VITAMIN B-12,) 1000 MCG/ML injection Inject 1 mL (1,000 mcg total) into the muscle every 30 (thirty) days. 30 mL 3  . diclofenac sodium (VOLTAREN) 1 % GEL Apply 2 g topically 4 (four) times daily. 3 Tube 5  . docusate sodium (COLACE) 100 MG capsule Take 100 mg by mouth once.    . furosemide (LASIX) 40 MG tablet Take 1 tablet (40 mg total) by mouth daily. 90 tablet 3  . losartan (COZAAR) 50 MG tablet Take 1 tablet (50 mg total) by mouth daily. 90 tablet 3  . omeprazole (PRILOSEC) 40 MG capsule Take 1 capsule (40 mg total) by mouth daily. 90 capsule 3   No current facility-administered medications on file prior to visit.    Review of Systems Constitutional: Negative for increased diaphoresis, other activity, appetite or other siginficant weight change  HENT: Negative for worsening hearing loss, ear pain, facial swelling, mouth sores and neck stiffness.   Eyes: Negative for other worsening pain, redness or visual disturbance.  Respiratory: Negative for shortness of breath and wheezing.   Cardiovascular: Negative for chest pain and palpitations.  Gastrointestinal: Negative for  diarrhea, blood in stool, abdominal distention or other pain Genitourinary: Negative for hematuria, flank pain or change in urine volume.  Musculoskeletal: Negative for myalgias or other joint complaints.  Skin: Negative for color change and wound.  Neurological: Negative for syncope and numbness. other than noted Hematological: Negative for adenopathy. or other swelling Psychiatric/Behavioral: Negative for hallucinations,  self-injury, decreased concentration or other worsening agitation.      Objective:   Physical Exam BP 140/92 mmHg  Pulse 79  Temp(Src) 98.2 F (36.8 C) (Oral)  Ht 6' (1.829 m)  Wt 196 lb 8 oz (89.132 kg)  BMI 26.64 kg/m2  SpO2 96% VS noted,  Constitutional: Pt is oriented to person, place, and time. Appears well-developed and well-nourished.  Head: Normocephalic and atraumatic.  Right Ear: External ear normal.  Left Ear: External ear normal.  Nose: Nose normal.  Mouth/Throat: Oropharynx is clear and moist.  Eyes: Conjunctivae and EOM are normal. Pupils are equal, round, and reactive to light.  Neck: Normal range of motion. Neck supple. No JVD present. No tracheal deviation present.  Cardiovascular: Normal rate, regular rhythm, normal heart sounds and intact distal pulses.   Pulmonary/Chest: Effort normal and breath sounds without rales or wheezing  Abdominal: Soft. Bowel sounds are normal. NT. No HSM  Musculoskeletal: Normal range of motion. Exhibits no edema.  Lymphadenopathy:  Has no cervical adenopathy.  Neurological: Pt is alert and oriented to person, place, and time. Pt has normal reflexes. No cranial nerve deficit. Motor grossly intact Skin: Skin is warm and dry. No rash noted.  Psychiatric:  Has normal mood and affect. Behavior is normal.     Assessment & Plan:

## 2014-03-16 NOTE — Assessment & Plan Note (Signed)

## 2014-03-19 LAB — LDL CHOLESTEROL, DIRECT: LDL DIRECT: 93 mg/dL

## 2014-05-15 ENCOUNTER — Other Ambulatory Visit: Payer: Self-pay

## 2014-05-15 MED ORDER — FUROSEMIDE 40 MG PO TABS: 40.0000 mg | ORAL_TABLET | Freq: Every day | ORAL | Status: DC

## 2014-05-15 MED ORDER — CYANOCOBALAMIN 1000 MCG/ML IJ SOLN: 1000.0000 ug | INTRAMUSCULAR | Status: DC

## 2014-05-15 MED ORDER — OMEPRAZOLE 40 MG PO CPDR: 40.0000 mg | DELAYED_RELEASE_CAPSULE | Freq: Every day | ORAL | Status: DC

## 2014-05-15 MED ORDER — ALLOPURINOL 100 MG PO TABS: 100.0000 mg | ORAL_TABLET | Freq: Every day | ORAL | Status: DC

## 2014-05-15 MED ORDER — LOSARTAN POTASSIUM 50 MG PO TABS: 50.0000 mg | ORAL_TABLET | Freq: Every day | ORAL | Status: DC

## 2014-05-15 NOTE — Telephone Encounter (Signed)
Pt. Has requested refills on allopurinol, omeprazole, furosemide, and losartan. Vitamin B12 prescription was printed out for patient to pick up as requested.

## 2014-05-17 ENCOUNTER — Telehealth: Payer: Self-pay | Admitting: Internal Medicine

## 2014-05-17 MED ORDER — CYANOCOBALAMIN 1000 MCG/ML IJ SOLN: 1000.0000 ug | INTRAMUSCULAR | Status: DC

## 2014-05-17 NOTE — Telephone Encounter (Signed)
Done hardcopy to Cherina  

## 2014-05-17 NOTE — Telephone Encounter (Signed)
Pt wife called in and would like a write script for the   cyanocobalamin (,VITAMIN B-12,) 1000 MCG/ML injection [[id number]]      So she can check prices at pharmacy because it is a level 4 med

## 2014-05-17 NOTE — Telephone Encounter (Addendum)
Script is at the front desk.

## 2014-08-31 ENCOUNTER — Encounter: Payer: Self-pay | Admitting: Internal Medicine

## 2014-08-31 ENCOUNTER — Ambulatory Visit (INDEPENDENT_AMBULATORY_CARE_PROVIDER_SITE_OTHER): Payer: Medicare Other | Admitting: Internal Medicine

## 2014-08-31 VITALS — BP 128/88 | HR 106 | Temp 98.4°F | Ht 72.0 in | Wt 193.0 lb

## 2014-08-31 DIAGNOSIS — C61 Malignant neoplasm of prostate: Secondary | ICD-10-CM | POA: Diagnosis not present

## 2014-08-31 DIAGNOSIS — E785 Hyperlipidemia, unspecified: Secondary | ICD-10-CM | POA: Diagnosis not present

## 2014-08-31 DIAGNOSIS — I10 Essential (primary) hypertension: Secondary | ICD-10-CM | POA: Diagnosis not present

## 2014-08-31 NOTE — Patient Instructions (Signed)
Please continue all other medications as before, and refills have been done if requested.  Please have the pharmacy call with any other refills you may need.  Please continue your efforts at being more active, low cholesterol diet, and weight control.  Please keep your appointments with your specialists as you may have planned     

## 2014-08-31 NOTE — Assessment & Plan Note (Addendum)
  Mild rise in PSA lead to prostate biopsy - 1:11 positive for adenocarcinoma. CT abd/pelvis was negative except for enlarged prostate.  Treatment - hormonal  Asympt, to cont proscar and flomax,  to f/u any worsening symptoms or concerns

## 2014-08-31 NOTE — Assessment & Plan Note (Signed)
stable overall by history and exam, recent data reviewed with pt, and pt to continue medical treatment as before,  to f/u any worsening symptoms or concerns Lab Results  Component Value Date   CHOL 167 03/15/2014   HDL 47.10 03/15/2014   LDLCALC 162* 05/01/2013   LDLDIRECT 93.0 03/15/2014   TRIG 259.0* 03/15/2014   CHOLHDL 4 03/15/2014   D/w pt, declines statin for now, for low chol diet

## 2014-08-31 NOTE — Assessment & Plan Note (Signed)
stable overall by history and exam, recent data reviewed with pt, and pt to continue medical treatment as before,  to f/u any worsening symptoms or concerns BP Readings from Last 3 Encounters:  08/31/14 128/88  03/15/14 140/92  05/01/13 146/80

## 2014-08-31 NOTE — Progress Notes (Signed)
Subjective:    Patient ID: Stephen Meza, male    DOB: [birth year], 81 y.o.   MRN: [id number]  HPI  Here to f/u, has had some dizziness maybe some improved with reducing his losartan from 50 to 25 mg qd, o/w overall doing ok,  Pt denies chest pain, increasing sob or doe, wheezing, orthopnea, PND, increased LE swelling, palpitations, dizziness or syncope.  Pt denies new neurological symptoms such as new headache, or facial or extremity weakness or numbness.  Pt denies polydipsia, polyuria, or low sugar episode.   Pt denies new neurological symptoms such as new headache, or facial or extremity weakness or numbness.   Pt states overall good compliance with meds, mostly trying to follow appropriate diet, with wt overall stable. Denies urinary symptoms such as dysuria, frequency, urgency, flank pain, hematuria or n/v, fever, chills.  Past Medical History  Diagnosis Date  . DUODENITIS, WITHOUT HEMORRHAGE 07/14/2007  . ERECTILE DYSFUNCTION 07/14/2007  . ESOPHAGITIS 07/14/2007  . EXTERNAL HEMORRHOIDS 07/14/2007  . GERD 07/14/2007  . HYPERTENSION 07/14/2007  . PERIPHERAL NEUROPATHY 07/14/2007  . PERIPHERAL VASCULAR DISEASE 07/14/2007  . PODAGRA 07/14/2007  . TOBACCO ABUSE, HX OF 02/12/2010  . TONSILLECTOMY, HX OF 07/14/2007  . TREMOR, ESSENTIAL 07/14/2007  . VITAMIN B12 DEFICIENCY 07/14/2007  . Gout   . Prostate cancer 02/2012    pt had screening and then biopsy   Past Surgical History  Procedure Laterality Date  . Excision of lipoma    . Surgery to reduce turbinate and straighten deviated septum    . Tonsillectomy    . Prostate biopsy Bilateral 03/10/2012    reports that he quit smoking about 20 years ago. He has quit using smokeless tobacco. He reports that he drinks about 18.0 oz of alcohol per week. He reports that he does not use illicit drugs. family history includes Breast cancer in his other; Hypothyroidism in his daughter. No Known Allergies Current Outpatient Prescriptions on File Prior  to Visit  Medication Sig Dispense Refill  . allopurinol (ZYLOPRIM) 100 MG tablet Take 1 tablet (100 mg total) by mouth daily. 90 tablet 3  . colchicine 0.6 MG tablet TAKE 2 TABLETS AT THE START OF GOUT FLARE.MAY REPEAT 1 TAB 2 HOURS LATER. LIMIT 3 TABS IN 24 HOURS. 9 tablet 3  . cyanocobalamin (,VITAMIN B-12,) 1000 MCG/ML injection Inject 1 mL (1,000 mcg total) into the muscle every 30 (thirty) days. 30 mL 3  . diclofenac sodium (VOLTAREN) 1 % GEL Apply 2 g topically 4 (four) times daily. 3 Tube 5  . docusate sodium (COLACE) 100 MG capsule Take 100 mg by mouth once.    . furosemide (LASIX) 40 MG tablet Take 1 tablet (40 mg total) by mouth daily. 90 tablet 3  . losartan (COZAAR) 50 MG tablet Take 1 tablet (50 mg total) by mouth daily. 90 tablet 3  . omeprazole (PRILOSEC) 40 MG capsule Take 1 capsule (40 mg total) by mouth daily. 90 capsule 3   No current facility-administered medications on file prior to visit.   Review of Systems  Constitutional: Negative for unusual diaphoresis or night sweats HENT: Negative for ringing in ear or discharge Eyes: Negative for double vision or worsening visual disturbance.  Respiratory: Negative for choking and stridor.   Gastrointestinal: Negative for vomiting or other signifcant bowel change Genitourinary: Negative for hematuria or change in urine volume.  Musculoskeletal: Negative for other MSK pain or swelling Skin: Negative for color change and worsening wound.  Neurological: Negative for  tremors and numbness other than noted  Psychiatric/Behavioral: Negative for decreased concentration or agitation other than above       Objective:   Physical Exam BP 128/88 mmHg  Pulse 106  Temp(Src) 98.4 F (36.9 C) (Oral)  Ht 6' (1.829 m)  Wt 193 lb (87.544 kg)  BMI 26.17 kg/m2  SpO2 96% VS noted,  Constitutional: Pt appears in no significant distress HENT: Head: NCAT.  Right Ear: External ear normal.  Left Ear: External ear normal.  Eyes: . Pupils are  equal, round, and reactive to light. Conjunctivae and EOM are normal Neck: Normal range of motion. Neck supple.  Cardiovascular: Normal rate and regular rhythm.   Pulmonary/Chest: Effort normal and breath sounds without rales or wheezing.  Neurological: Pt is alert. Not confused , motor grossly intact Skin: Skin is warm. No rash, no LE edema Psychiatric: Pt behavior is normal. No agitation.     Assessment & Plan:

## 2014-09-06 ENCOUNTER — Encounter: Payer: Self-pay | Admitting: Gastroenterology

## 2015-01-22 IMAGING — CR DG FOOT COMPLETE 3+V*R*
3 series · 3 of 3 positions shown · non-contrast
Comparison: None.

CLINICAL DATA: Right foot pain. History of gout.

EXAM:
RIGHT FOOT COMPLETE - 3+ VIEW

[view not recorded (1 of 3)]
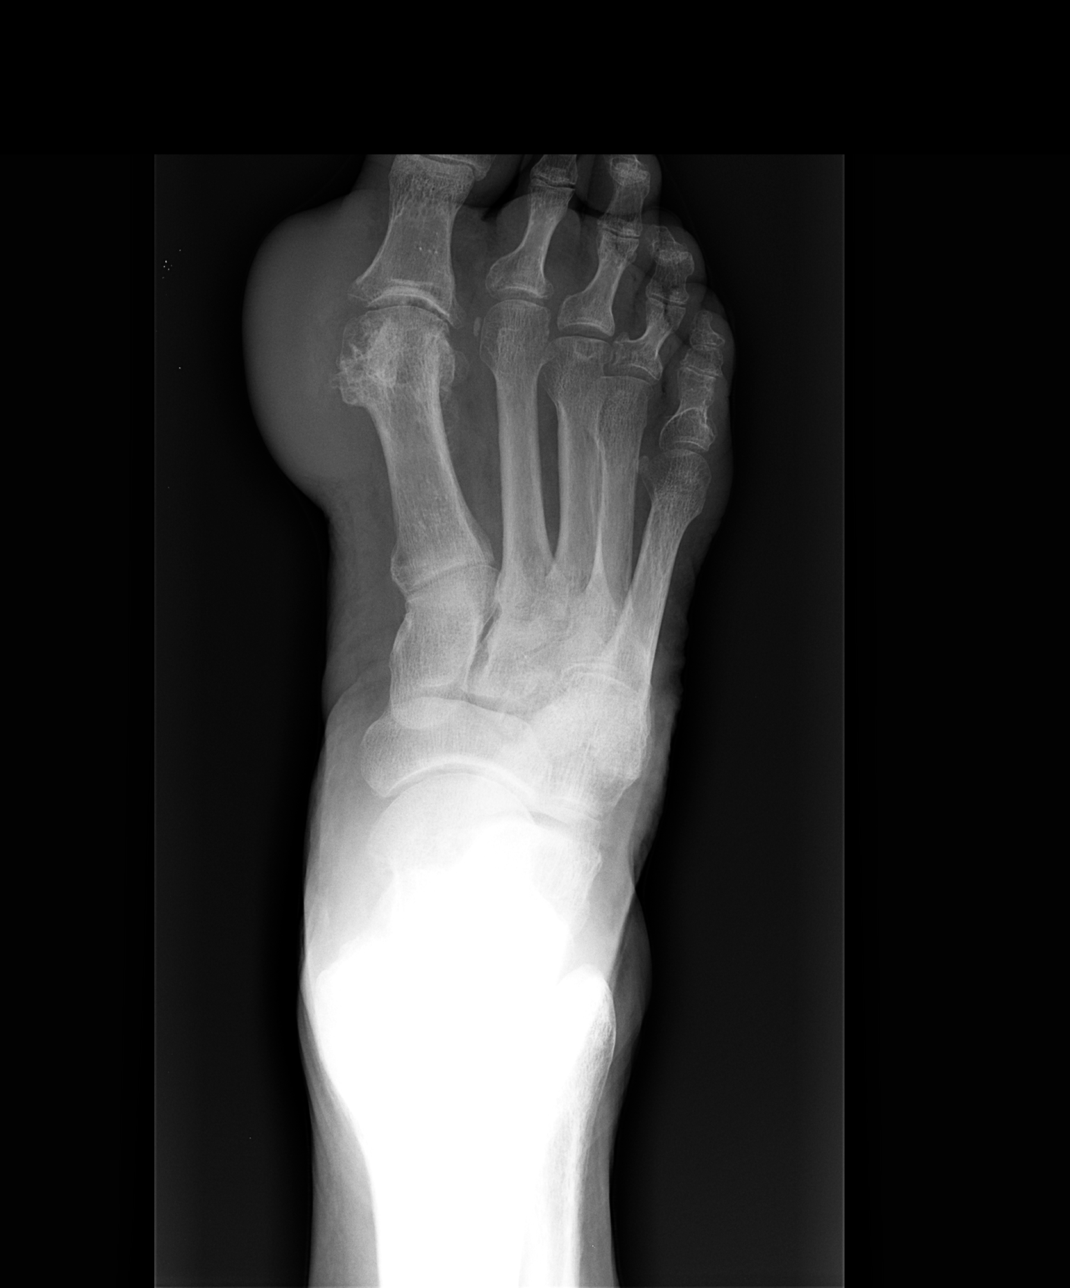

[view not recorded (2 of 3)]
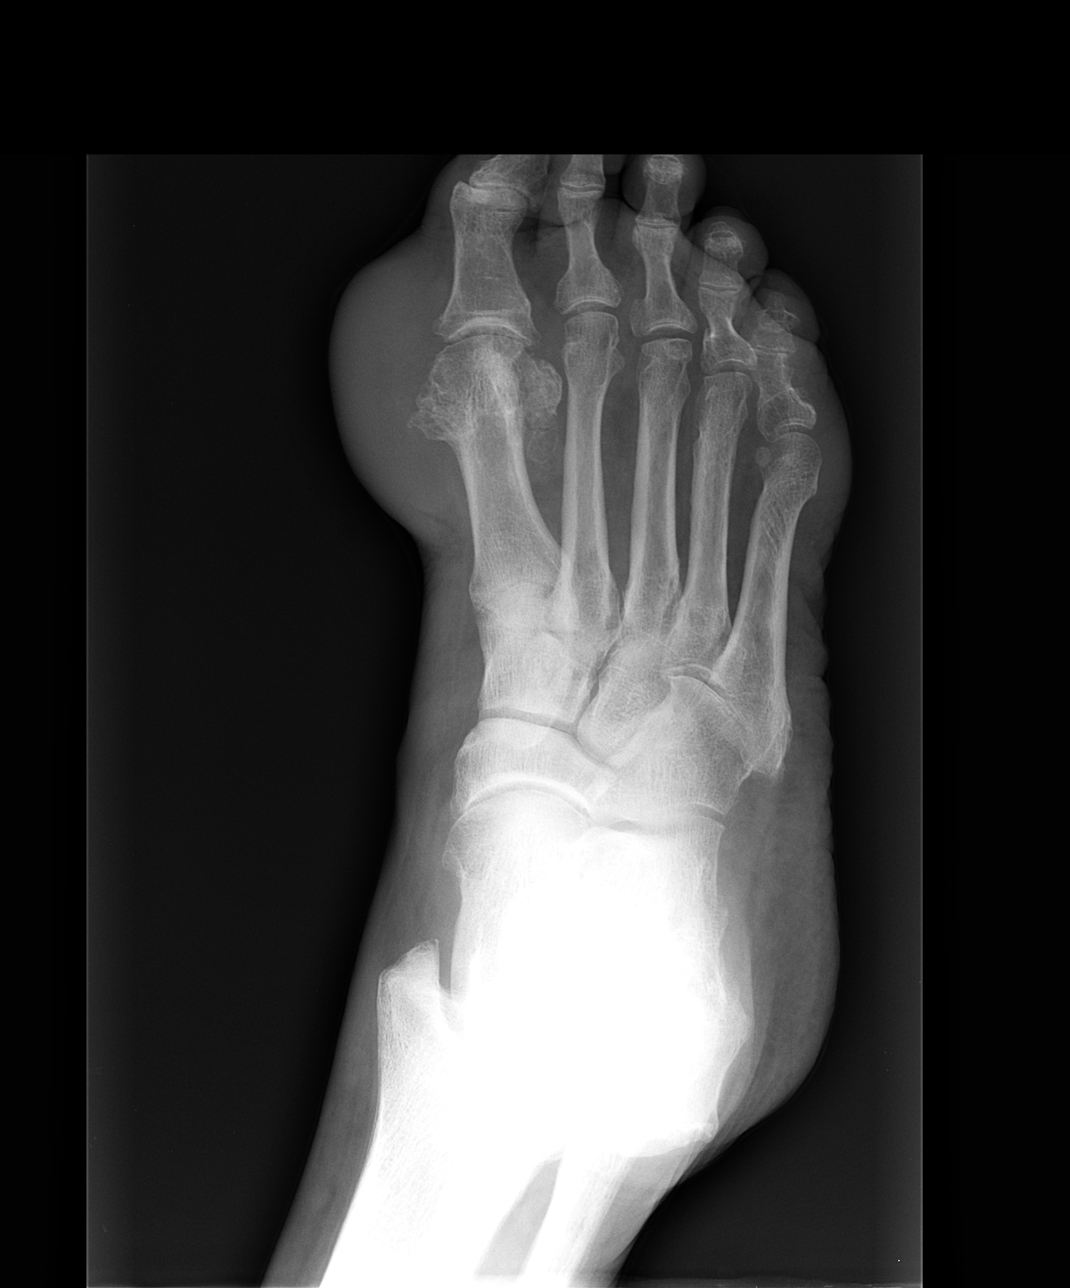

[view not recorded (3 of 3)]
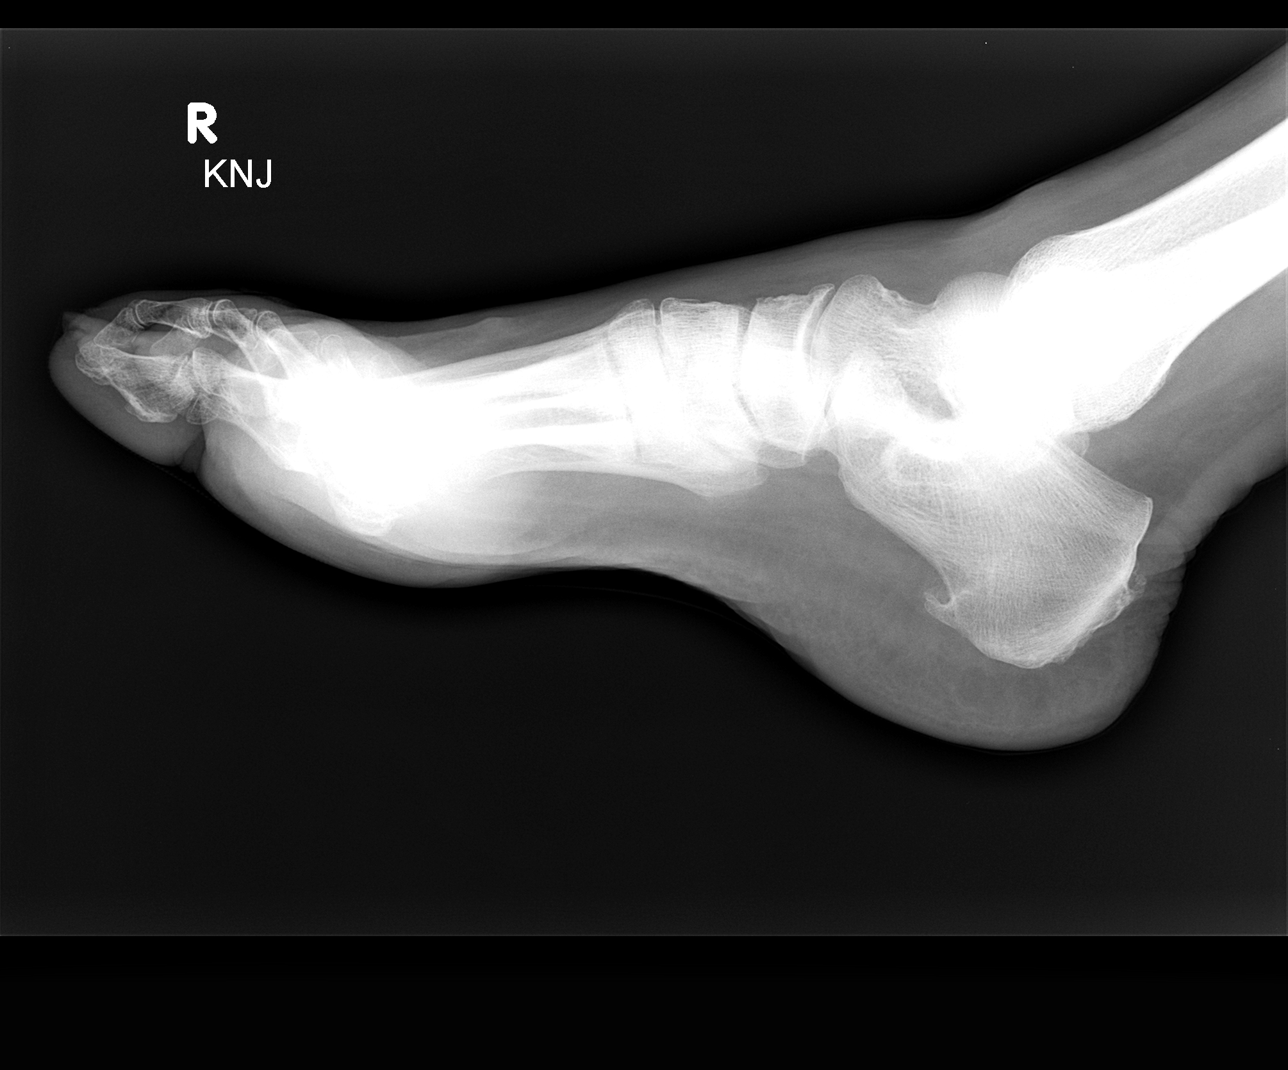

[3 of 3 positions shown; findings below may reference images not displayed]

FINDINGS: There is marked soft tissue swelling over the medial aspect of the
1st metatarsal phalangeal joint with large erosions at that joint.
This is consistent with gout with a large tophus. Deformities of the
bases of the 3rd, 4th and 5th toes are consistent with old
fractures. There is flattening of the heads of the 3rd and 4th
metatarsals which may be due to remote trauma as well. There are
small posterior and plantar calcaneal spurs. Slight dorsal spurring
on the proximal navicular.
IMPRESSION: Gout of the 1st metatarsophalangeal joint with a large adjacent
tophus.

## 2015-03-05 DIAGNOSIS — N401 Enlarged prostate with lower urinary tract symptoms: Secondary | ICD-10-CM | POA: Diagnosis not present

## 2015-03-05 DIAGNOSIS — R972 Elevated prostate specific antigen [PSA]: Secondary | ICD-10-CM | POA: Diagnosis not present

## 2015-03-05 DIAGNOSIS — C61 Malignant neoplasm of prostate: Secondary | ICD-10-CM | POA: Diagnosis not present

## 2015-03-05 DIAGNOSIS — N318 Other neuromuscular dysfunction of bladder: Secondary | ICD-10-CM | POA: Diagnosis not present

## 2015-03-05 DIAGNOSIS — N3020 Other chronic cystitis without hematuria: Secondary | ICD-10-CM | POA: Diagnosis not present

## 2015-03-14 ENCOUNTER — Ambulatory Visit (INDEPENDENT_AMBULATORY_CARE_PROVIDER_SITE_OTHER): Payer: Medicare Other | Admitting: Internal Medicine

## 2015-03-14 ENCOUNTER — Encounter: Payer: Self-pay | Admitting: Internal Medicine

## 2015-03-14 ENCOUNTER — Other Ambulatory Visit (INDEPENDENT_AMBULATORY_CARE_PROVIDER_SITE_OTHER): Payer: Medicare Other

## 2015-03-14 VITALS — BP 132/84 | HR 103 | Temp 98.5°F | Ht 72.0 in | Wt 193.0 lb

## 2015-03-14 DIAGNOSIS — I10 Essential (primary) hypertension: Secondary | ICD-10-CM | POA: Diagnosis not present

## 2015-03-14 DIAGNOSIS — R739 Hyperglycemia, unspecified: Secondary | ICD-10-CM | POA: Diagnosis not present

## 2015-03-14 DIAGNOSIS — Z0000 Encounter for general adult medical examination without abnormal findings: Secondary | ICD-10-CM

## 2015-03-14 LAB — CBC WITH DIFFERENTIAL/PLATELET
BASOS ABS: 0 10*3/uL (ref 0.0–0.1)
Basophils Relative: 0.4 % (ref 0.0–3.0)
EOS ABS: 0.1 10*3/uL (ref 0.0–0.7)
Eosinophils Relative: 0.8 % (ref 0.0–5.0)
HCT: 41.6 % (ref 39.0–52.0)
Hemoglobin: 13.7 g/dL (ref 13.0–17.0)
LYMPHS ABS: 1.1 10*3/uL (ref 0.7–4.0)
Lymphocytes Relative: 17.2 % (ref 12.0–46.0)
MCHC: 33 g/dL (ref 30.0–36.0)
MCV: 97.4 fl (ref 78.0–100.0)
MONO ABS: 0.7 10*3/uL (ref 0.1–1.0)
MONOS PCT: 10.6 % (ref 3.0–12.0)
NEUTROS ABS: 4.5 10*3/uL (ref 1.4–7.7)
NEUTROS PCT: 71 % (ref 43.0–77.0)
PLATELETS: 198 10*3/uL (ref 150.0–400.0)
RBC: 4.27 Mil/uL (ref 4.22–5.81)
RDW: 14.4 % (ref 11.5–15.5)
WBC: 6.3 10*3/uL (ref 4.0–10.5)

## 2015-03-14 LAB — URINALYSIS, ROUTINE W REFLEX MICROSCOPIC
Hgb urine dipstick: NEGATIVE
KETONES UR: NEGATIVE
Leukocytes, UA: NEGATIVE
Nitrite: NEGATIVE
SPECIFIC GRAVITY, URINE: 1.025 (ref 1.000–1.030)
TOTAL PROTEIN, URINE-UPE24: NEGATIVE
URINE GLUCOSE: NEGATIVE
UROBILINOGEN UA: 0.2 (ref 0.0–1.0)
pH: 5.5 (ref 5.0–8.0)

## 2015-03-14 MED ORDER — FUROSEMIDE 40 MG PO TABS: 40.0000 mg | ORAL_TABLET | Freq: Every day | ORAL | Status: DC

## 2015-03-14 MED ORDER — OMEPRAZOLE 40 MG PO CPDR: 40.0000 mg | DELAYED_RELEASE_CAPSULE | Freq: Every day | ORAL | Status: DC

## 2015-03-14 MED ORDER — LOSARTAN POTASSIUM 50 MG PO TABS: 50.0000 mg | ORAL_TABLET | Freq: Every day | ORAL | Status: DC

## 2015-03-14 MED ORDER — CYANOCOBALAMIN 1000 MCG/ML IJ SOLN: 1000.0000 ug | INTRAMUSCULAR | Status: DC

## 2015-03-14 NOTE — Progress Notes (Signed)
Pre visit review using our clinic review tool, if applicable. No additional management support is needed unless otherwise documented below in the visit note. 

## 2015-03-14 NOTE — Patient Instructions (Signed)

## 2015-03-14 NOTE — Progress Notes (Signed)
Subjective:    Patient ID: Stephen Meza, male    DOB: [birth year], 82 y.o.   MRN: [id number]  HPI  Here for wellness and f/u;  Overall doing ok;  Pt denies Chest pain, worsening SOB, DOE, wheezing, orthopnea, PND, worsening LE edema, palpitations, dizziness or syncope.  Pt denies neurological change such as new headache, facial or extremity weakness.  Pt denies polydipsia, polyuria, or low sugar symptoms. Pt states overall good compliance with treatment and medications, good tolerability, and has been trying to follow appropriate diet.  Pt denies worsening depressive symptoms, suicidal ideation or panic. No fever, night sweats, wt loss, loss of appetite, or other constitutional symptoms.  Pt states good ability with ADL's, has low fall risk, home safety reviewed and adequate, no other significant changes in hearing or vision, and only occasionally active with exercise. On lupron, most recent psa normal, only rare hot flash.. Past Medical History  Diagnosis Date  . DUODENITIS, WITHOUT HEMORRHAGE 07/14/2007  . ERECTILE DYSFUNCTION 07/14/2007  . ESOPHAGITIS 07/14/2007  . EXTERNAL HEMORRHOIDS 07/14/2007  . GERD 07/14/2007  . HYPERTENSION 07/14/2007  . PERIPHERAL NEUROPATHY 07/14/2007  . PERIPHERAL VASCULAR DISEASE 07/14/2007  . PODAGRA 07/14/2007  . TOBACCO ABUSE, HX OF 02/12/2010  . TONSILLECTOMY, HX OF 07/14/2007  . TREMOR, ESSENTIAL 07/14/2007  . VITAMIN B12 DEFICIENCY 07/14/2007  . Gout   . Prostate cancer ([place]) 02/2012    pt had screening and then biopsy   Past Surgical History  Procedure Laterality Date  . Excision of lipoma    . Surgery to reduce turbinate and straighten deviated septum    . Tonsillectomy    . Prostate biopsy Bilateral 03/10/2012    reports that he quit smoking about 21 years ago. He has quit using smokeless tobacco. He reports that he drinks about 18.0 oz of alcohol per week. He reports that he does not use illicit drugs. family history includes Breast cancer in his  other; Hypothyroidism in his daughter. No Known Allergies Current Outpatient Prescriptions on File Prior to Visit  Medication Sig Dispense Refill  . allopurinol (ZYLOPRIM) 100 MG tablet Take 1 tablet (100 mg total) by mouth daily. 90 tablet 3  . colchicine 0.6 MG tablet TAKE 2 TABLETS AT THE START OF GOUT FLARE.MAY REPEAT 1 TAB 2 HOURS LATER. LIMIT 3 TABS IN 24 HOURS. 9 tablet 3  . diclofenac sodium (VOLTAREN) 1 % GEL Apply 2 g topically 4 (four) times daily. (Patient not taking: Reported on 03/14/2015) 3 Tube 5  . docusate sodium (COLACE) 100 MG capsule Take 100 mg by mouth once.    . finasteride (PROSCAR) 5 MG tablet Take 5 mg by mouth daily.  5  . tamsulosin (FLOMAX) 0.4 MG CAPS capsule Take 0.4 mg by mouth at bedtime.  5   No current facility-administered medications on file prior to visit.   Review of Systems Constitutional: Negative for increased diaphoresis, other activity, appetite or siginficant weight change other than noted HENT: Negative for worsening hearing loss, ear pain, facial swelling, mouth sores and neck stiffness.   Eyes: Negative for other worsening pain, redness or visual disturbance.  Respiratory: Negative for shortness of breath and wheezing  Cardiovascular: Negative for chest pain and palpitations.  Gastrointestinal: Negative for diarrhea, blood in stool, abdominal distention or other pain Genitourinary: Negative for hematuria, flank pain or change in urine volume.  Musculoskeletal: Negative for myalgias or other joint complaints.  Skin: Negative for color change and wound or drainage.  Neurological: Negative for syncope  and numbness. other than noted Hematological: Negative for adenopathy. or other swelling Psychiatric/Behavioral: Negative for hallucinations, SI, self-injury, decreased concentration or other worsening agitation.      Objective:   Physical Exam BP 132/84 mmHg  Pulse 103  Temp(Src) 98.5 F (36.9 C) (Oral)  Ht 6' (1.829 m)  Wt 193 lb (87.544  kg)  BMI 26.17 kg/m2  SpO2 96% VS noted,  Constitutional: Pt is oriented to person, place, and time. Appears well-developed and well-nourished, in no significant distress Head: Normocephalic and atraumatic.  Right Ear: External ear normal.  Left Ear: External ear normal.  Nose: Nose normal.  Mouth/Throat: Oropharynx is clear and moist.  Eyes: Conjunctivae and EOM are normal. Pupils are equal, round, and reactive to light.  Neck: Normal range of motion. Neck supple. No JVD present. No tracheal deviation present or significant neck LA or mass Cardiovascular: Normal rate, regular rhythm, normal heart sounds and intact distal pulses.   Pulmonary/Chest: Effort normal and breath sounds without rales or wheezing  Abdominal: Soft. Bowel sounds are normal. NT. No HSM  Musculoskeletal: Normal range of motion. Exhibits no edema.  Lymphadenopathy:  Has no cervical adenopathy.  Neurological: Pt is alert and oriented to person, place, and time. Pt has normal reflexes. No cranial nerve deficit. Motor grossly intact Skin: Skin is warm and dry. No rash noted.  Psychiatric:  Has normal mood and affect. Behavior is normal.     Assessment & Plan:

## 2015-03-15 LAB — LIPID PANEL
CHOLESTEROL: 190 mg/dL (ref 0–200)
HDL: 60.1 mg/dL (ref >39.00)
LDL Cholesterol: 95 mg/dL (ref 0–99)
NONHDL: 129.95
Total CHOL/HDL Ratio: 3
Triglycerides: 174 mg/dL — ABNORMAL HIGH (ref 0.0–149.0)
VLDL: 34.8 mg/dL (ref 0.0–40.0)

## 2015-03-15 LAB — BASIC METABOLIC PANEL
BUN: 11 mg/dL (ref 6–23)
CHLORIDE: 98 meq/L (ref 96–112)
CO2: 30 meq/L (ref 19–32)
Calcium: 9.4 mg/dL (ref 8.4–10.5)
Creatinine, Ser: 0.91 mg/dL (ref 0.40–1.50)
GFR: 85.26 mL/min (ref >60.00)
GLUCOSE: 102 mg/dL — AB (ref 70–99)
POTASSIUM: 4.3 meq/L (ref 3.5–5.1)
SODIUM: 135 meq/L (ref 135–145)

## 2015-03-15 LAB — HEPATIC FUNCTION PANEL
ALK PHOS: 87 U/L (ref 39–117)
ALT: 19 U/L (ref 0–53)
AST: 28 U/L (ref 0–37)
Albumin: 4 g/dL (ref 3.5–5.2)
BILIRUBIN DIRECT: 0 mg/dL (ref 0.0–0.3)
TOTAL PROTEIN: 7.2 g/dL (ref 6.0–8.3)
Total Bilirubin: 0.6 mg/dL (ref 0.2–1.2)

## 2015-03-15 LAB — TSH: TSH: 3.75 u[IU]/mL (ref 0.35–4.50)

## 2015-03-16 DIAGNOSIS — R739 Hyperglycemia, unspecified: Secondary | ICD-10-CM | POA: Insufficient documentation

## 2015-03-16 NOTE — Assessment & Plan Note (Signed)

## 2015-03-16 NOTE — Assessment & Plan Note (Signed)
stable overall by history and exam, and pt to continue medical treatment as before,  to f/u any worsening symptoms or concerns 

## 2015-03-19 DIAGNOSIS — H04123 Dry eye syndrome of bilateral lacrimal glands: Secondary | ICD-10-CM | POA: Diagnosis not present

## 2015-03-19 DIAGNOSIS — Z961 Presence of intraocular lens: Secondary | ICD-10-CM | POA: Diagnosis not present

## 2015-06-18 DIAGNOSIS — R310 Gross hematuria: Secondary | ICD-10-CM | POA: Diagnosis not present

## 2015-06-18 DIAGNOSIS — N3021 Other chronic cystitis with hematuria: Secondary | ICD-10-CM | POA: Diagnosis not present

## 2015-06-18 DIAGNOSIS — N401 Enlarged prostate with lower urinary tract symptoms: Secondary | ICD-10-CM | POA: Diagnosis not present

## 2015-06-18 DIAGNOSIS — C672 Malignant neoplasm of lateral wall of bladder: Secondary | ICD-10-CM | POA: Diagnosis not present

## 2015-06-18 DIAGNOSIS — D090 Carcinoma in situ of bladder: Secondary | ICD-10-CM | POA: Diagnosis not present

## 2015-06-18 DIAGNOSIS — D414 Neoplasm of uncertain behavior of bladder: Secondary | ICD-10-CM | POA: Diagnosis not present

## 2015-06-18 DIAGNOSIS — C61 Malignant neoplasm of prostate: Secondary | ICD-10-CM | POA: Diagnosis not present

## 2015-06-18 DIAGNOSIS — N3090 Cystitis, unspecified without hematuria: Secondary | ICD-10-CM | POA: Diagnosis not present

## 2015-06-26 DIAGNOSIS — I10 Essential (primary) hypertension: Secondary | ICD-10-CM | POA: Diagnosis not present

## 2015-06-26 DIAGNOSIS — C672 Malignant neoplasm of lateral wall of bladder: Secondary | ICD-10-CM | POA: Diagnosis not present

## 2015-06-26 DIAGNOSIS — C679 Malignant neoplasm of bladder, unspecified: Secondary | ICD-10-CM | POA: Diagnosis not present

## 2015-06-26 DIAGNOSIS — D494 Neoplasm of unspecified behavior of bladder: Secondary | ICD-10-CM | POA: Diagnosis not present

## 2015-06-26 DIAGNOSIS — Z79899 Other long term (current) drug therapy: Secondary | ICD-10-CM | POA: Diagnosis not present

## 2015-06-26 DIAGNOSIS — K219 Gastro-esophageal reflux disease without esophagitis: Secondary | ICD-10-CM | POA: Diagnosis not present

## 2015-07-01 DIAGNOSIS — R310 Gross hematuria: Secondary | ICD-10-CM | POA: Diagnosis not present

## 2015-07-01 DIAGNOSIS — N3021 Other chronic cystitis with hematuria: Secondary | ICD-10-CM | POA: Diagnosis not present

## 2015-07-01 DIAGNOSIS — C61 Malignant neoplasm of prostate: Secondary | ICD-10-CM | POA: Diagnosis not present

## 2015-07-01 DIAGNOSIS — N401 Enlarged prostate with lower urinary tract symptoms: Secondary | ICD-10-CM | POA: Diagnosis not present

## 2015-07-01 DIAGNOSIS — C672 Malignant neoplasm of lateral wall of bladder: Secondary | ICD-10-CM | POA: Diagnosis not present

## 2015-07-04 DIAGNOSIS — N3020 Other chronic cystitis without hematuria: Secondary | ICD-10-CM | POA: Diagnosis not present

## 2015-07-04 DIAGNOSIS — C61 Malignant neoplasm of prostate: Secondary | ICD-10-CM | POA: Diagnosis not present

## 2015-07-17 DIAGNOSIS — C61 Malignant neoplasm of prostate: Secondary | ICD-10-CM | POA: Diagnosis not present

## 2015-07-17 DIAGNOSIS — C672 Malignant neoplasm of lateral wall of bladder: Secondary | ICD-10-CM | POA: Diagnosis not present

## 2015-07-17 DIAGNOSIS — N323 Diverticulum of bladder: Secondary | ICD-10-CM | POA: Diagnosis not present

## 2015-07-19 DIAGNOSIS — L97511 Non-pressure chronic ulcer of other part of right foot limited to breakdown of skin: Secondary | ICD-10-CM | POA: Diagnosis not present

## 2015-07-30 DIAGNOSIS — R972 Elevated prostate specific antigen [PSA]: Secondary | ICD-10-CM | POA: Diagnosis not present

## 2015-07-30 DIAGNOSIS — R310 Gross hematuria: Secondary | ICD-10-CM | POA: Diagnosis not present

## 2015-07-30 DIAGNOSIS — C672 Malignant neoplasm of lateral wall of bladder: Secondary | ICD-10-CM | POA: Diagnosis not present

## 2015-07-30 DIAGNOSIS — C61 Malignant neoplasm of prostate: Secondary | ICD-10-CM | POA: Diagnosis not present

## 2015-07-30 DIAGNOSIS — N3090 Cystitis, unspecified without hematuria: Secondary | ICD-10-CM | POA: Diagnosis not present

## 2015-08-07 DIAGNOSIS — M6702 Short Achilles tendon (acquired), left ankle: Secondary | ICD-10-CM | POA: Diagnosis not present

## 2015-08-07 DIAGNOSIS — L97511 Non-pressure chronic ulcer of other part of right foot limited to breakdown of skin: Secondary | ICD-10-CM | POA: Diagnosis not present

## 2015-08-26 ENCOUNTER — Other Ambulatory Visit: Payer: Self-pay | Admitting: Internal Medicine

## 2015-08-28 DIAGNOSIS — M6702 Short Achilles tendon (acquired), left ankle: Secondary | ICD-10-CM | POA: Diagnosis not present

## 2015-08-28 DIAGNOSIS — L97511 Non-pressure chronic ulcer of other part of right foot limited to breakdown of skin: Secondary | ICD-10-CM | POA: Diagnosis not present

## 2015-11-04 DIAGNOSIS — C61 Malignant neoplasm of prostate: Secondary | ICD-10-CM | POA: Diagnosis not present

## 2015-11-04 DIAGNOSIS — C672 Malignant neoplasm of lateral wall of bladder: Secondary | ICD-10-CM | POA: Diagnosis not present

## 2015-11-04 DIAGNOSIS — N3020 Other chronic cystitis without hematuria: Secondary | ICD-10-CM | POA: Diagnosis not present

## 2016-01-29 ENCOUNTER — Other Ambulatory Visit (INDEPENDENT_AMBULATORY_CARE_PROVIDER_SITE_OTHER): Payer: Self-pay | Admitting: Orthopedic Surgery

## 2016-02-11 DIAGNOSIS — H35341 Macular cyst, hole, or pseudohole, right eye: Secondary | ICD-10-CM | POA: Diagnosis not present

## 2016-02-27 DIAGNOSIS — H3561 Retinal hemorrhage, right eye: Secondary | ICD-10-CM | POA: Diagnosis not present

## 2016-02-27 DIAGNOSIS — H35373 Puckering of macula, bilateral: Secondary | ICD-10-CM | POA: Diagnosis not present

## 2016-02-27 DIAGNOSIS — H35343 Macular cyst, hole, or pseudohole, bilateral: Secondary | ICD-10-CM | POA: Diagnosis not present

## 2016-03-05 DIAGNOSIS — L853 Xerosis cutis: Secondary | ICD-10-CM | POA: Diagnosis not present

## 2016-03-05 DIAGNOSIS — L821 Other seborrheic keratosis: Secondary | ICD-10-CM | POA: Diagnosis not present

## 2016-03-05 DIAGNOSIS — D225 Melanocytic nevi of trunk: Secondary | ICD-10-CM | POA: Diagnosis not present

## 2016-03-05 DIAGNOSIS — L570 Actinic keratosis: Secondary | ICD-10-CM | POA: Diagnosis not present

## 2016-03-05 DIAGNOSIS — Z85828 Personal history of other malignant neoplasm of skin: Secondary | ICD-10-CM | POA: Diagnosis not present

## 2016-03-09 DIAGNOSIS — C672 Malignant neoplasm of lateral wall of bladder: Secondary | ICD-10-CM | POA: Diagnosis not present

## 2016-03-09 DIAGNOSIS — N3020 Other chronic cystitis without hematuria: Secondary | ICD-10-CM | POA: Diagnosis not present

## 2016-03-09 DIAGNOSIS — C61 Malignant neoplasm of prostate: Secondary | ICD-10-CM | POA: Diagnosis not present

## 2016-03-19 ENCOUNTER — Other Ambulatory Visit (INDEPENDENT_AMBULATORY_CARE_PROVIDER_SITE_OTHER): Payer: Medicare Other

## 2016-03-19 ENCOUNTER — Encounter: Payer: Self-pay | Admitting: Internal Medicine

## 2016-03-19 ENCOUNTER — Ambulatory Visit (INDEPENDENT_AMBULATORY_CARE_PROVIDER_SITE_OTHER): Payer: Medicare Other | Admitting: Internal Medicine

## 2016-03-19 VITALS — BP 140/80 | HR 80 | Temp 98.2°F | Resp 20 | Wt 193.0 lb

## 2016-03-19 DIAGNOSIS — Z0000 Encounter for general adult medical examination without abnormal findings: Secondary | ICD-10-CM | POA: Diagnosis not present

## 2016-03-19 DIAGNOSIS — R42 Dizziness and giddiness: Secondary | ICD-10-CM | POA: Diagnosis not present

## 2016-03-19 DIAGNOSIS — C679 Malignant neoplasm of bladder, unspecified: Secondary | ICD-10-CM

## 2016-03-19 DIAGNOSIS — Z0001 Encounter for general adult medical examination with abnormal findings: Secondary | ICD-10-CM | POA: Diagnosis not present

## 2016-03-19 DIAGNOSIS — I10 Essential (primary) hypertension: Secondary | ICD-10-CM

## 2016-03-19 DIAGNOSIS — R739 Hyperglycemia, unspecified: Secondary | ICD-10-CM

## 2016-03-19 DIAGNOSIS — H33311 Horseshoe tear of retina without detachment, right eye: Secondary | ICD-10-CM | POA: Insufficient documentation

## 2016-03-19 DIAGNOSIS — K222 Esophageal obstruction: Secondary | ICD-10-CM | POA: Insufficient documentation

## 2016-03-19 HISTORY — DX: Horseshoe tear of retina without detachment, right eye: H33.311

## 2016-03-19 HISTORY — DX: Malignant neoplasm of bladder, unspecified: C67.9

## 2016-03-19 HISTORY — DX: Esophageal obstruction: K22.2

## 2016-03-19 LAB — CBC WITH DIFFERENTIAL/PLATELET
BASOS ABS: 0 10*3/uL (ref 0.0–0.1)
Basophils Relative: 0.4 % (ref 0.0–3.0)
Eosinophils Absolute: 0.1 10*3/uL (ref 0.0–0.7)
Eosinophils Relative: 1.7 % (ref 0.0–5.0)
HCT: 41 % (ref 39.0–52.0)
Hemoglobin: 13.9 g/dL (ref 13.0–17.0)
LYMPHS ABS: 1.2 10*3/uL (ref 0.7–4.0)
LYMPHS PCT: 20.6 % (ref 12.0–46.0)
MCHC: 34 g/dL (ref 30.0–36.0)
MCV: 93.9 fl (ref 78.0–100.0)
Monocytes Absolute: 0.6 10*3/uL (ref 0.1–1.0)
Monocytes Relative: 10.2 % (ref 3.0–12.0)
NEUTROS ABS: 3.8 10*3/uL (ref 1.4–7.7)
NEUTROS PCT: 67.1 % (ref 43.0–77.0)
PLATELETS: 217 10*3/uL (ref 150.0–400.0)
RBC: 4.37 Mil/uL (ref 4.22–5.81)
RDW: 13.7 % (ref 11.5–15.5)
WBC: 5.6 10*3/uL (ref 4.0–10.5)

## 2016-03-19 LAB — BASIC METABOLIC PANEL
BUN: 8 mg/dL (ref 6–23)
CO2: 27 mEq/L (ref 19–32)
Calcium: 9.4 mg/dL (ref 8.4–10.5)
Chloride: 101 mEq/L (ref 96–112)
Creatinine, Ser: 0.8 mg/dL (ref 0.40–1.50)
GFR: 98.67 mL/min (ref >60.00)
Glucose, Bld: 114 mg/dL — ABNORMAL HIGH (ref 70–99)
POTASSIUM: 4.3 meq/L (ref 3.5–5.1)
SODIUM: 136 meq/L (ref 135–145)

## 2016-03-19 LAB — HEPATIC FUNCTION PANEL
ALK PHOS: 78 U/L (ref 39–117)
ALT: 12 U/L (ref 0–53)
AST: 20 U/L (ref 0–37)
Albumin: 4.1 g/dL (ref 3.5–5.2)
BILIRUBIN DIRECT: 0.1 mg/dL (ref 0.0–0.3)
TOTAL PROTEIN: 7.4 g/dL (ref 6.0–8.3)
Total Bilirubin: 0.5 mg/dL (ref 0.2–1.2)

## 2016-03-19 LAB — TSH: TSH: 3.59 u[IU]/mL (ref 0.35–4.50)

## 2016-03-19 LAB — LIPID PANEL
CHOLESTEROL: 187 mg/dL (ref 0–200)
HDL: 57.7 mg/dL (ref >39.00)
LDL CALC: 101 mg/dL — AB (ref 0–99)
NonHDL: 129.48
TRIGLYCERIDES: 142 mg/dL (ref 0.0–149.0)
Total CHOL/HDL Ratio: 3
VLDL: 28.4 mg/dL (ref 0.0–40.0)

## 2016-03-19 LAB — HEMOGLOBIN A1C: HEMOGLOBIN A1C: 5.6 % (ref 4.6–6.5)

## 2016-03-19 MED ORDER — AMLODIPINE BESYLATE 2.5 MG PO TABS: 2.5000 mg | ORAL_TABLET | Freq: Every day | ORAL | 3 refills | Status: DC

## 2016-03-19 NOTE — Assessment & Plan Note (Signed)
stable overall by history and exam, recent data reviewed with pt, and pt to continue medical treatment as before,  to f/u any worsening symptoms or concerns Lab Results  Component Value Date   HGBA1C 5.6 03/19/2016

## 2016-03-19 NOTE — Assessment & Plan Note (Addendum)
Etiology unclear, cant r/o med side effect, will defer to pt - d/c lasix, losartan  In addition to the time spent performing CPE, I spent an additional 15 minutes face to face,in which greater than 50% of this time was spent in counseling and coordination of care for patient's illness as documented.

## 2016-03-19 NOTE — Progress Notes (Signed)
Subjective:    Patient ID: Stephen Meza, male    DOB: [birth year], 83 y.o.   MRN: [id number]  HPI  Here for wellness and f/u;  Overall doing ok;  Pt denies Chest pain, worsening SOB, DOE, wheezing, orthopnea, PND, worsening LE edema, palpitations, dizziness or syncope.  Pt denies neurological change such as new headache, facial or extremity weakness.  Pt denies polydipsia, polyuria, or low sugar symptoms. Pt states overall good compliance with treatment and medications, good tolerability, and has been trying to follow appropriate diet.  Pt denies worsening depressive symptoms, suicidal ideation or panic. No fever, night sweats, wt loss, loss of appetite, or other constitutional symptoms.  Pt states good ability with ADL's, has low fall risk, home safety reviewed and adequate, no other significant changes in hearing or vision, and only occasionally active with exercise.  S/p tx per urology for bladder ca, with recent exam clear. Now reduced leuprom to every 6 mo from 4 mo.    Does have dizziness with losartan even at 25 mg, so stopped this several mo ago, and no further dizziness or weak spells. Then states he also stopped the lasix at 40 mg.  Asks for change in meds.   No falls. No other new history Past Medical History:  Diagnosis Date  . DUODENITIS, WITHOUT HEMORRHAGE 07/14/2007  . ERECTILE DYSFUNCTION 07/14/2007  . Esophageal stricture 03/19/2016  . ESOPHAGITIS 07/14/2007  . EXTERNAL HEMORRHOIDS 07/14/2007  . GERD 07/14/2007  . Gout   . HYPERTENSION 07/14/2007  . PERIPHERAL NEUROPATHY 07/14/2007  . PERIPHERAL VASCULAR DISEASE 07/14/2007  . PODAGRA 07/14/2007  . Prostate cancer ([place]) 02/2012   pt had screening and then biopsy  . Retinal tear of right eye 03/19/2016  . TOBACCO ABUSE, HX OF 02/12/2010  . TONSILLECTOMY, HX OF 07/14/2007  . TREMOR, ESSENTIAL 07/14/2007  . VITAMIN B12 DEFICIENCY 07/14/2007   Past Surgical History:  Procedure Laterality Date  . Excision of lipoma    . PROSTATE  BIOPSY Bilateral 03/10/2012  . surgery to reduce turbinate and straighten deviated septum    . TONSILLECTOMY      reports that he quit smoking about 22 years ago. He has quit using smokeless tobacco. He reports that he drinks about 18.0 oz of alcohol per week . He reports that he does not use drugs. family history includes Breast cancer in his other; Hypothyroidism in his daughter. Allergies  Allergen Reactions  . Ciprofloxacin Hcl Other (See Comments)    numbness   Current Outpatient Prescriptions on File Prior to Visit  Medication Sig Dispense Refill  . allopurinol (ZYLOPRIM) 100 MG tablet TAKE 1 TABLET BY MOUTH TWICE A DAY FOR GOUT 180 tablet 4  . colchicine 0.6 MG tablet TAKE 2 TABLETS AT THE START OF GOUT FLARE.MAY REPEAT 1 TAB 2 HOURS LATER. LIMIT 3 TABS IN 24 HOURS. 9 tablet 3  . cyanocobalamin (,VITAMIN B-12,) 1000 MCG/ML injection INJECT ONE ML (CC) INTRAMUSCULARLY EVERY 30 DAYS 30 mL 5  . diclofenac sodium (VOLTAREN) 1 % GEL Apply 2 g topically 4 (four) times daily. 3 Tube 5  . docusate sodium (COLACE) 100 MG capsule Take 100 mg by mouth once.    . finasteride (PROSCAR) 5 MG tablet Take 5 mg by mouth daily.  5  . losartan (COZAAR) 50 MG tablet Take 1 tablet (50 mg total) by mouth daily. 90 tablet 3  . omeprazole (PRILOSEC) 40 MG capsule Take 1 capsule (40 mg total) by mouth daily. 90 capsule 3  .  tamsulosin (FLOMAX) 0.4 MG CAPS capsule Take 0.4 mg by mouth at bedtime.  5   No current facility-administered medications on file prior to visit.    Review of Systems Constitutional: Negative for increased diaphoresis, or other activity, appetite or siginficant weight change other than noted HENT: Negative for worsening hearing loss, ear pain, facial swelling, mouth sores and neck stiffness.   Eyes: Negative for other worsening pain, redness or visual disturbance.  Respiratory: Negative for choking or stridor Cardiovascular: Negative for other chest pain and palpitations.    Gastrointestinal: Negative for worsening diarrhea, blood in stool, or abdominal distention Genitourinary: Negative for hematuria, flank pain or change in urine volume.  Musculoskeletal: Negative for myalgias or other joint complaints.  Skin: Negative for other color change and wound or drainage.  Neurological: Negative for syncope and numbness. other than noted Hematological: Negative for adenopathy. or other swelling Psychiatric/Behavioral: Negative for hallucinations, SI, self-injury, decreased concentration or other worsening agitation.  All other system neg per pt    Objective:   Physical Exam BP 140/80   Pulse 80   Temp 98.2 F (36.8 C) (Oral)   Resp 20   Wt 193 lb (87.5 kg)   SpO2 96%   BMI 26.18 kg/m  VS noted,  Constitutional: Pt is oriented to person, place, and time. Appears well-developed and well-nourished, in no significant distress Head: Normocephalic and atraumatic  Eyes: Conjunctivae and EOM are normal. Pupils are equal, round, and reactive to light Right Ear: External ear normal.  Left Ear: External ear normal Nose: Nose normal.  Mouth/Throat: Oropharynx is clear and moist  Neck: Normal range of motion. Neck supple. No JVD present. No tracheal deviation present or significant neck LA or mass Cardiovascular: Normal rate, regular rhythm, normal heart sounds and intact distal pulses.   Pulmonary/Chest: Effort normal and breath sounds without rales or wheezing  Abdominal: Soft. Bowel sounds are normal. NT. No HSM  Musculoskeletal: Normal range of motion. Exhibits no edema Lymphadenopathy: Has no cervical adenopathy.  Neurological: Pt is alert and oriented to person, place, and time. Pt has normal reflexes. No cranial nerve deficit. Motor grossly intact, mild HOH Skin: Skin is warm and dry. No rash noted or new ulcers Psychiatric:  Has normal mood and affect. Behavior is normal.  No other new exam findings    Assessment & Plan:

## 2016-03-19 NOTE — Progress Notes (Signed)
Pre visit review using our clinic review tool, if applicable. No additional management support is needed unless otherwise documented below in the visit note. 

## 2016-03-19 NOTE — Assessment & Plan Note (Signed)

## 2016-03-19 NOTE — Patient Instructions (Addendum)
Please take all new medication as prescribed - the amlodipine 2.5 mg per day  Please call in 3 weeks if your BP is still > 140/90  Please continue all other medications as before, and refills have been done if requested.  Please have the pharmacy call with any other refills you may need.  Please continue your efforts at being more active, low cholesterol diet, and weight control.  You are otherwise up to date with prevention measures today.  Please keep your appointments with your specialists as you may have planned  You will be contacted regarding the referral for: colonoscopy  Please go to the LAB in the Basement (turn left off the elevator) for the tests to be done today  You will be contacted by phone if any changes need to be made immediately.  Otherwise, you will receive a letter about your results with an explanation, but please check with MyChart first.  Please remember to sign up for MyChart if you have not done so, as this will be important to you in the future with finding out test results, communicating by private email, and scheduling acute appointments online when needed.  Please return in 1 year for your yearly visit, or sooner if needed, with Lab testing done 3-5 days before

## 2016-03-19 NOTE — Assessment & Plan Note (Addendum)
To add amlodipine 2.5 qd, o/w stable overall by history and exam, recent data reviewed with pt,  to f/u any worsening symptoms or concerns BP Readings from Last 3 Encounters:  03/19/16 140/80  03/14/15 132/84  08/31/14 128/88

## 2016-03-30 ENCOUNTER — Encounter: Payer: Self-pay | Admitting: Internal Medicine

## 2016-04-02 DIAGNOSIS — H3561 Retinal hemorrhage, right eye: Secondary | ICD-10-CM | POA: Diagnosis not present

## 2016-04-02 DIAGNOSIS — H35343 Macular cyst, hole, or pseudohole, bilateral: Secondary | ICD-10-CM | POA: Diagnosis not present

## 2016-04-02 DIAGNOSIS — H35373 Puckering of macula, bilateral: Secondary | ICD-10-CM | POA: Diagnosis not present

## 2016-04-10 ENCOUNTER — Other Ambulatory Visit: Payer: Self-pay | Admitting: Internal Medicine

## 2016-04-13 DIAGNOSIS — S20219A Contusion of unspecified front wall of thorax, initial encounter: Secondary | ICD-10-CM | POA: Diagnosis not present

## 2016-04-14 ENCOUNTER — Encounter: Payer: Self-pay | Admitting: Internal Medicine

## 2016-04-14 ENCOUNTER — Ambulatory Visit (INDEPENDENT_AMBULATORY_CARE_PROVIDER_SITE_OTHER): Payer: Medicare Other | Admitting: Internal Medicine

## 2016-04-14 VITALS — BP 112/62 | HR 84 | Ht 72.0 in | Wt 190.6 lb

## 2016-04-14 DIAGNOSIS — K219 Gastro-esophageal reflux disease without esophagitis: Secondary | ICD-10-CM | POA: Diagnosis not present

## 2016-04-14 DIAGNOSIS — K222 Esophageal obstruction: Secondary | ICD-10-CM

## 2016-04-14 DIAGNOSIS — Z1211 Encounter for screening for malignant neoplasm of colon: Secondary | ICD-10-CM

## 2016-04-14 NOTE — Progress Notes (Signed)
HISTORY OF PRESENT ILLNESS:  Stephen Meza is a 83 y.o. male with past medical history as listed below. He presents today 3 question of his primary care provider regarding follow-up of reflux disease, esophageal stricture, and the need for screening colonoscopy. The patient was last evaluated in the office February 2013 for chronic GERD and recurrent intermittent solid food dysphagia. Upper endoscopy the following week revealed a distal esophageal stricture which was dilated to [place]. Continued on omeprazole. Patient reports no reflux symptoms on omeprazole. Also, he denies recurrent dysphagia. Next, he states that he was encouraged to have repeat screening colonoscopy. His original examination was performed with Dr. Velora Heckler in July 2006. Examination was said to be normal except for hemorrhoids. Preparation was described as good. Follow-up in 5 years recommended. The recommendation follow-up was modified to 10 years based on current guidelines. He is motivated. He remains active. He is anticipating eye surgery in the near future. He is accompanied today by his wife. GI review of systems is otherwise negative.  REVIEW OF SYSTEMS:  All non-GI ROS negative except for back pain, cough, fatigue  Past Medical History:  Diagnosis Date  . DUODENITIS, WITHOUT HEMORRHAGE 07/14/2007  . ERECTILE DYSFUNCTION 07/14/2007  . Esophageal stricture 03/19/2016  . ESOPHAGITIS 07/14/2007  . EXTERNAL HEMORRHOIDS 07/14/2007  . GERD 07/14/2007  . Gout   . HYPERTENSION 07/14/2007  . PERIPHERAL NEUROPATHY 07/14/2007  . PERIPHERAL VASCULAR DISEASE 07/14/2007  . PODAGRA 07/14/2007  . Prostate cancer ([place]) 02/2012   pt had screening and then biopsy  . Retinal tear of right eye 03/19/2016  . TOBACCO ABUSE, HX OF 02/12/2010  . TONSILLECTOMY, HX OF 07/14/2007  . TREMOR, ESSENTIAL 07/14/2007  . VITAMIN B12 DEFICIENCY 07/14/2007    Past Surgical History:  Procedure Laterality Date  . Excision of lipoma    .  PROSTATE BIOPSY Bilateral 03/10/2012  . surgery to reduce turbinate and straighten deviated septum    . TONSILLECTOMY      Social History Kendra A. Samford  reports that he quit smoking about 22 years ago. He has quit using smokeless tobacco. He reports that he drinks about 18.0 oz of alcohol per week . He reports that he does not use drugs.  family history includes Breast cancer in his other; Hypothyroidism in his daughter.  Allergies  Allergen Reactions  . Ciprofloxacin Hcl Other (See Comments)    numbness       PHYSICAL EXAMINATION: Vital signs: BP 112/62   Pulse 84   Ht 6' (1.829 m)   Wt 190 lb 9.6 oz (86.5 kg)   BMI 25.85 kg/m   Constitutional: generally well-appearing, no acute distress Psychiatric: alert and oriented x3, cooperative Eyes: extraocular movements intact, anicteric, conjunctiva pink Mouth: oral pharynx moist, no lesions Neck: supple no lymphadenopathy Cardiovascular: heart regular rate and rhythm, no murmur Lungs: clear to auscultation bilaterally Abdomen: soft, nontender, nondistended, no obvious ascites, no peritoneal signs, normal bowel sounds, no organomegaly Rectal:Deferred until colonoscopy Extremities: no clubbing cyanosis or lower extremity edema bilaterally Skin: no lesions on visible extremities Neuro: No focal deficits. Cranial nerves intact  ASSESSMENT:  #1. GERD, complicated by peptic stricture. Asymptomatic post dilation on PPI #2. Screening colonoscopy. The patient is an appropriate candidate without contraindication. He is in excellent health at age 34 and is motivated. Last examination 2006 was negative for neoplasia   PLAN:  #1. Reflux precautions #2. Continue omeprazole once daily #3. Repeat upper endoscopy should the patient develop significant recurrent dysphagia. He is  aware #4. Screening colonoscopy. Patient will schedule this after successfully completing his eye surgery. He may see the previsit nurse directly.The nature  of the procedure, as well as the risks, benefits, and alternatives were carefully and thoroughly reviewed with the patient. Ample time for discussion and questions allowed. The patient understood, was satisfied, and agreed to proceed.

## 2016-04-14 NOTE — Patient Instructions (Signed)
Call our office after your eye surgery to schedule a previsit and colonoscopy.

## 2016-05-01 DIAGNOSIS — H35343 Macular cyst, hole, or pseudohole, bilateral: Secondary | ICD-10-CM | POA: Diagnosis not present

## 2016-05-01 DIAGNOSIS — H35371 Puckering of macula, right eye: Secondary | ICD-10-CM | POA: Diagnosis not present

## 2016-05-01 DIAGNOSIS — H35341 Macular cyst, hole, or pseudohole, right eye: Secondary | ICD-10-CM | POA: Diagnosis not present

## 2016-05-07 DIAGNOSIS — H4311 Vitreous hemorrhage, right eye: Secondary | ICD-10-CM | POA: Diagnosis not present

## 2016-05-07 DIAGNOSIS — H35343 Macular cyst, hole, or pseudohole, bilateral: Secondary | ICD-10-CM | POA: Diagnosis not present

## 2016-05-07 DIAGNOSIS — H35373 Puckering of macula, bilateral: Secondary | ICD-10-CM | POA: Diagnosis not present

## 2016-05-14 DIAGNOSIS — H35343 Macular cyst, hole, or pseudohole, bilateral: Secondary | ICD-10-CM | POA: Diagnosis not present

## 2016-05-14 DIAGNOSIS — H35373 Puckering of macula, bilateral: Secondary | ICD-10-CM | POA: Diagnosis not present

## 2016-06-04 DIAGNOSIS — H35343 Macular cyst, hole, or pseudohole, bilateral: Secondary | ICD-10-CM | POA: Diagnosis not present

## 2016-06-09 DIAGNOSIS — N3020 Other chronic cystitis without hematuria: Secondary | ICD-10-CM | POA: Diagnosis not present

## 2016-06-09 DIAGNOSIS — C61 Malignant neoplasm of prostate: Secondary | ICD-10-CM | POA: Diagnosis not present

## 2016-06-09 DIAGNOSIS — N318 Other neuromuscular dysfunction of bladder: Secondary | ICD-10-CM | POA: Diagnosis not present

## 2016-06-09 DIAGNOSIS — C672 Malignant neoplasm of lateral wall of bladder: Secondary | ICD-10-CM | POA: Diagnosis not present

## 2016-09-10 DIAGNOSIS — C672 Malignant neoplasm of lateral wall of bladder: Secondary | ICD-10-CM | POA: Diagnosis not present

## 2016-09-10 DIAGNOSIS — N318 Other neuromuscular dysfunction of bladder: Secondary | ICD-10-CM | POA: Diagnosis not present

## 2016-09-10 DIAGNOSIS — N3020 Other chronic cystitis without hematuria: Secondary | ICD-10-CM | POA: Diagnosis not present

## 2016-09-10 DIAGNOSIS — C61 Malignant neoplasm of prostate: Secondary | ICD-10-CM | POA: Diagnosis not present

## 2016-09-17 DIAGNOSIS — H35372 Puckering of macula, left eye: Secondary | ICD-10-CM | POA: Diagnosis not present

## 2016-09-17 DIAGNOSIS — H35342 Macular cyst, hole, or pseudohole, left eye: Secondary | ICD-10-CM | POA: Diagnosis not present

## 2016-09-17 DIAGNOSIS — H43812 Vitreous degeneration, left eye: Secondary | ICD-10-CM | POA: Diagnosis not present

## 2016-10-08 ENCOUNTER — Other Ambulatory Visit: Payer: Self-pay | Admitting: Internal Medicine

## 2017-01-04 ENCOUNTER — Other Ambulatory Visit: Payer: Self-pay | Admitting: Internal Medicine

## 2017-01-15 DIAGNOSIS — B029 Zoster without complications: Secondary | ICD-10-CM | POA: Diagnosis not present

## 2017-01-22 DIAGNOSIS — B029 Zoster without complications: Secondary | ICD-10-CM | POA: Diagnosis not present

## 2017-02-04 DIAGNOSIS — B029 Zoster without complications: Secondary | ICD-10-CM | POA: Diagnosis not present

## 2017-02-27 ENCOUNTER — Other Ambulatory Visit: Payer: Self-pay | Admitting: Internal Medicine

## 2017-03-12 DIAGNOSIS — L720 Epidermal cyst: Secondary | ICD-10-CM | POA: Diagnosis not present

## 2017-03-12 DIAGNOSIS — L821 Other seborrheic keratosis: Secondary | ICD-10-CM | POA: Diagnosis not present

## 2017-03-12 DIAGNOSIS — L570 Actinic keratosis: Secondary | ICD-10-CM | POA: Diagnosis not present

## 2017-03-12 DIAGNOSIS — Z85828 Personal history of other malignant neoplasm of skin: Secondary | ICD-10-CM | POA: Diagnosis not present

## 2017-03-12 DIAGNOSIS — D692 Other nonthrombocytopenic purpura: Secondary | ICD-10-CM | POA: Diagnosis not present

## 2017-03-16 DIAGNOSIS — C672 Malignant neoplasm of lateral wall of bladder: Secondary | ICD-10-CM | POA: Diagnosis not present

## 2017-03-16 DIAGNOSIS — N3020 Other chronic cystitis without hematuria: Secondary | ICD-10-CM | POA: Diagnosis not present

## 2017-03-16 DIAGNOSIS — C61 Malignant neoplasm of prostate: Secondary | ICD-10-CM | POA: Diagnosis not present

## 2017-03-23 ENCOUNTER — Encounter: Payer: Self-pay | Admitting: Internal Medicine

## 2017-03-23 ENCOUNTER — Other Ambulatory Visit (INDEPENDENT_AMBULATORY_CARE_PROVIDER_SITE_OTHER): Payer: Medicare Other

## 2017-03-23 ENCOUNTER — Ambulatory Visit (INDEPENDENT_AMBULATORY_CARE_PROVIDER_SITE_OTHER): Payer: Medicare Other | Admitting: Internal Medicine

## 2017-03-23 VITALS — BP 130/84 | HR 78 | Temp 98.1°F | Ht 72.0 in | Wt 188.0 lb

## 2017-03-23 DIAGNOSIS — R739 Hyperglycemia, unspecified: Secondary | ICD-10-CM

## 2017-03-23 DIAGNOSIS — Z0000 Encounter for general adult medical examination without abnormal findings: Secondary | ICD-10-CM

## 2017-03-23 LAB — BASIC METABOLIC PANEL
BUN: 13 mg/dL (ref 6–23)
CHLORIDE: 102 meq/L (ref 96–112)
CO2: 30 mEq/L (ref 19–32)
Calcium: 9.2 mg/dL (ref 8.4–10.5)
Creatinine, Ser: 0.99 mg/dL (ref 0.40–1.50)
GFR: 76.97 mL/min (ref >60.00)
Glucose, Bld: 109 mg/dL — ABNORMAL HIGH (ref 70–99)
POTASSIUM: 4.2 meq/L (ref 3.5–5.1)
Sodium: 139 mEq/L (ref 135–145)

## 2017-03-23 LAB — CBC WITH DIFFERENTIAL/PLATELET
BASOS ABS: 0 10*3/uL (ref 0.0–0.1)
Basophils Relative: 0.8 % (ref 0.0–3.0)
EOS PCT: 1.2 % (ref 0.0–5.0)
Eosinophils Absolute: 0.1 10*3/uL (ref 0.0–0.7)
HCT: 41 % (ref 39.0–52.0)
Hemoglobin: 13.5 g/dL (ref 13.0–17.0)
Lymphocytes Relative: 27.8 % (ref 12.0–46.0)
Lymphs Abs: 1.7 10*3/uL (ref 0.7–4.0)
MCHC: 33.1 g/dL (ref 30.0–36.0)
MCV: 94.9 fl (ref 78.0–100.0)
MONOS PCT: 8.2 % (ref 3.0–12.0)
Monocytes Absolute: 0.5 10*3/uL (ref 0.1–1.0)
NEUTROS PCT: 62 % (ref 43.0–77.0)
Neutro Abs: 3.9 10*3/uL (ref 1.4–7.7)
Platelets: 216 10*3/uL (ref 150.0–400.0)
RBC: 4.32 Mil/uL (ref 4.22–5.81)
RDW: 13.9 % (ref 11.5–15.5)
WBC: 6.2 10*3/uL (ref 4.0–10.5)

## 2017-03-23 LAB — HEPATIC FUNCTION PANEL
ALBUMIN: 4.2 g/dL (ref 3.5–5.2)
ALK PHOS: 62 U/L (ref 39–117)
ALT: 15 U/L (ref 0–53)
AST: 19 U/L (ref 0–37)
Bilirubin, Direct: 0.1 mg/dL (ref 0.0–0.3)
Total Bilirubin: 0.5 mg/dL (ref 0.2–1.2)
Total Protein: 7.2 g/dL (ref 6.0–8.3)

## 2017-03-23 LAB — LIPID PANEL
CHOLESTEROL: 138 mg/dL (ref 0–200)
HDL: 40.9 mg/dL (ref >39.00)
LDL CALC: 82 mg/dL (ref 0–99)
NonHDL: 96.75
TRIGLYCERIDES: 72 mg/dL (ref 0.0–149.0)
Total CHOL/HDL Ratio: 3
VLDL: 14.4 mg/dL (ref 0.0–40.0)

## 2017-03-23 LAB — TSH: TSH: 3.64 u[IU]/mL (ref 0.35–4.50)

## 2017-03-23 LAB — HEMOGLOBIN A1C: Hgb A1c MFr Bld: 5.6 % (ref 4.6–6.5)

## 2017-03-23 MED ORDER — ZOSTER VAC RECOMB ADJUVANTED 50 MCG/0.5ML IM SUSR: 0.5000 mL | Freq: Once | INTRAMUSCULAR | 1 refills | Status: AC

## 2017-03-23 NOTE — Assessment & Plan Note (Signed)

## 2017-03-23 NOTE — Progress Notes (Signed)
Subjective:    Patient ID: Stephen Meza, male    DOB: 12-Apr-1933, 84 y.o.   MRN: [id number]  HPI  .Here for wellness and f/u;  Overall doing ok;  Pt denies Chest pain, worsening SOB, DOE, wheezing, orthopnea, PND, worsening LE edema, palpitations, dizziness or syncope.  Pt denies neurological change such as new headache, facial or extremity weakness.  Pt denies polydipsia, polyuria, or low sugar symptoms. Pt states overall good compliance with treatment and medications, good tolerability, and has been trying to follow appropriate diet.  Pt denies worsening depressive symptoms, suicidal ideation or panic. No fever, night sweats, wt loss, loss of appetite, or other constitutional symptoms.  Pt states good ability with ADL's, has low fall risk, home safety reviewed and adequate, no other significant changes in hearing or vision, and only occasionally active with exercise. Had recent shingles outbreak to right head area not involving the forehead or eye, gabapenint does help some, and finished the valtrex course.  BP's at home have been < 140/90, no further dizziness on current med regimen.  Has intermittent numbness to feet, has seen neurology, taking Vit b12 OTC.    S/p recent retinal surgury per pt, now doing very well Past Medical History:  Diagnosis Date  . DUODENITIS, WITHOUT HEMORRHAGE 07/14/2007  . ERECTILE DYSFUNCTION 07/14/2007  . Esophageal stricture 03/19/2016  . ESOPHAGITIS 07/14/2007  . EXTERNAL HEMORRHOIDS 07/14/2007  . GERD 07/14/2007  . Gout   . HYPERTENSION 07/14/2007  . PERIPHERAL NEUROPATHY 07/14/2007  . PERIPHERAL VASCULAR DISEASE 07/14/2007  . PODAGRA 07/14/2007  . Prostate cancer ([place]) 02/2012   pt had screening and then biopsy  . Retinal tear of right eye 03/19/2016  . TOBACCO ABUSE, HX OF 02/12/2010  . TONSILLECTOMY, HX OF 07/14/2007  . TREMOR, ESSENTIAL 07/14/2007  . VITAMIN B12 DEFICIENCY 07/14/2007   Past Surgical History:  Procedure Laterality Date  . Excision of  lipoma    . PROSTATE BIOPSY Bilateral 03/10/2012  . surgery to reduce turbinate and straighten deviated septum    . TONSILLECTOMY      reports that he quit smoking about 23 years ago. He has quit using smokeless tobacco. He reports that he drinks about 18.0 oz of alcohol per week. He reports that he does not use drugs. family history includes Breast cancer in his other; Hypothyroidism in his daughter. Allergies  Allergen Reactions  . Ciprofloxacin Hcl Other (See Comments)    numbness   Current Outpatient Medications on File Prior to Visit  Medication Sig Dispense Refill  . allopurinol (ZYLOPRIM) 100 MG tablet TAKE 1 TABLET BY MOUTH TWICE A DAY FOR GOUT 180 tablet 4  . amLODipine (NORVASC) 2.5 MG tablet TAKE 1 TABLET (2.5 MG TOTAL) BY MOUTH DAILY. 90 tablet 0  . colchicine 0.6 MG tablet TAKE 2 TABLETS AT THE START OF GOUT FLARE.MAY REPEAT 1 TAB 2 HOURS LATER. LIMIT 3 TABS IN 24 HOURS. 9 tablet 3  . cyanocobalamin (,VITAMIN B-12,) 1000 MCG/ML injection INJECT 1 ML (1000 MCG TOTAL) INTO THE MUSCLE EVERY 30 DAYS 3 mL 12  . docusate sodium (COLACE) 100 MG capsule Take 100 mg by mouth once.    . finasteride (PROSCAR) 5 MG tablet Take 5 mg by mouth daily.  5  . gabapentin (NEURONTIN) 300 MG capsule Take 300 mg by mouth 3 (three) times daily.    Marland Kitchen Leuprolide Acetate, 6 Month, (LUPRON DEPOT, 3-MONTH, IM) Inject into the muscle every 6 (six) months.    Marland Kitchen omeprazole (PRILOSEC) 40  MG capsule TAKE ONE CAPSULE BY MOUTH DAILY 90 capsule 0  . tamsulosin (FLOMAX) 0.4 MG CAPS capsule Take 0.4 mg by mouth at bedtime.  5   No current facility-administered medications on file prior to visit.    Review of Systems Constitutional: Negative for other unusual diaphoresis, sweats, appetite or weight changes HENT: Negative for other worsening hearing loss, ear pain, facial swelling, mouth sores or neck stiffness.   Eyes: Negative for other worsening pain, redness or other visual disturbance.  Respiratory: Negative  for other stridor or swelling Cardiovascular: Negative for other palpitations or other chest pain  Gastrointestinal: Negative for worsening diarrhea or loose stools, blood in stool, distention or other pain Genitourinary: Negative for hematuria, flank pain or other change in urine volume.  Musculoskeletal: Negative for myalgias or other joint swelling.  Skin: Negative for other color change, or other wound or worsening drainage.  Neurological: Negative for other syncope or numbness. Hematological: Negative for other adenopathy or swelling Psychiatric/Behavioral: Negative for hallucinations, other worsening agitation, SI, self-injury, or new decreased concentration All other system neg per pt    Objective:   Physical Exam BP 130/84   Pulse 78   Temp 98.1 F (36.7 C) (Oral)   Ht 6' (1.829 m)   Wt 188 lb (85.3 kg)   SpO2 99%   BMI 25.50 kg/m  VS noted, elderly, moving slowly but no tremors or unsteady Constitutional: Pt is oriented to person, place, and time. Appears well-developed and well-nourished, in no significant distress and comfortable Head: Normocephalic and atraumatic  Eyes: Conjunctivae and EOM are normal. Pupils are equal, round, and reactive to light Right Ear: External ear normal without discharge Left Ear: External ear normal without discharge Nose: Nose without discharge or deformity Mouth/Throat: Oropharynx is without other ulcerations and moist  Neck: Normal range of motion. Neck supple. No JVD present. No tracheal deviation present or significant neck LA or mass Cardiovascular: Normal rate, regular rhythm, normal heart sounds and intact distal pulses.   Pulmonary/Chest: WOB normal and breath sounds without rales or wheezing  Abdominal: Soft. Bowel sounds are normal. NT. No HSM  Musculoskeletal: Normal range of motion. Exhibits no edema Lymphadenopathy: Has no other cervical adenopathy.  Neurological: Pt is alert and oriented to person, place, and time. Pt has  normal reflexes. No cranial nerve deficit. Motor grossly intact, Gait intact Skin: Skin is warm and dry. No rash noted or new ulcerations Psychiatric:  Has normal mood and affect. Behavior is normal without agitation No other exam findings  Lab Results  Component Value Date   WBC 5.6 03/19/2016   HGB 13.9 03/19/2016   HCT 41.0 03/19/2016   PLT 217.0 03/19/2016   GLUCOSE 114 (H) 03/19/2016   CHOL 187 03/19/2016   TRIG 142.0 03/19/2016   HDL 57.70 03/19/2016   LDLDIRECT 93.0 03/15/2014   LDLCALC 101 (H) 03/19/2016   ALT 12 03/19/2016   AST 20 03/19/2016   NA 136 03/19/2016   K 4.3 03/19/2016   CL 101 03/19/2016   CREATININE 0.80 03/19/2016   BUN 8 03/19/2016   CO2 27 03/19/2016   TSH 3.59 03/19/2016   PSA 3.54 02/12/2010   HGBA1C 5.6 03/19/2016      Assessment & Plan:

## 2017-03-23 NOTE — Assessment & Plan Note (Signed)
Asympt, for f/u a1c 

## 2017-03-23 NOTE — Patient Instructions (Addendum)
Your shingles shot was sent to the pharmacy  Please continue all other medications as before, and refills have been done if requested.  Please have the pharmacy call with any other refills you may need.  Please continue your efforts at being more active, low cholesterol diet, and weight control.  You are otherwise up to date with prevention measures today.  Please keep your appointments with your specialists as you may have planned  Please go to the LAB in the Basement (turn left off the elevator) for the tests to be done today  You will be contacted by phone if any changes need to be made immediately.  Otherwise, you will receive a letter about your results with an explanation, but please check with MyChart first.  Please remember to sign up for MyChart if you have not done so, as this will be important to you in the future with finding out test results, communicating by private email, and scheduling acute appointments online when needed.  Please return in 1 year for your yearly visit, or sooner if needed, with Lab testing done 3-5 days before  

## 2017-03-24 ENCOUNTER — Encounter: Payer: Medicare Other | Admitting: Internal Medicine

## 2017-04-01 ENCOUNTER — Other Ambulatory Visit: Payer: Self-pay | Admitting: Internal Medicine

## 2017-04-20 DIAGNOSIS — N3020 Other chronic cystitis without hematuria: Secondary | ICD-10-CM | POA: Diagnosis not present

## 2017-04-20 DIAGNOSIS — C61 Malignant neoplasm of prostate: Secondary | ICD-10-CM | POA: Diagnosis not present

## 2017-04-23 ENCOUNTER — Other Ambulatory Visit (INDEPENDENT_AMBULATORY_CARE_PROVIDER_SITE_OTHER): Payer: Self-pay | Admitting: Orthopedic Surgery

## 2017-04-27 DIAGNOSIS — C672 Malignant neoplasm of lateral wall of bladder: Secondary | ICD-10-CM | POA: Diagnosis not present

## 2017-04-27 DIAGNOSIS — K8020 Calculus of gallbladder without cholecystitis without obstruction: Secondary | ICD-10-CM | POA: Diagnosis not present

## 2017-04-27 DIAGNOSIS — N23 Unspecified renal colic: Secondary | ICD-10-CM | POA: Diagnosis not present

## 2017-05-23 ENCOUNTER — Other Ambulatory Visit: Payer: Self-pay | Admitting: Internal Medicine

## 2017-09-14 DIAGNOSIS — N3020 Other chronic cystitis without hematuria: Secondary | ICD-10-CM | POA: Diagnosis not present

## 2017-09-14 DIAGNOSIS — C672 Malignant neoplasm of lateral wall of bladder: Secondary | ICD-10-CM | POA: Diagnosis not present

## 2017-09-14 DIAGNOSIS — C61 Malignant neoplasm of prostate: Secondary | ICD-10-CM | POA: Diagnosis not present

## 2017-09-14 DIAGNOSIS — N318 Other neuromuscular dysfunction of bladder: Secondary | ICD-10-CM | POA: Diagnosis not present

## 2017-09-29 DIAGNOSIS — N401 Enlarged prostate with lower urinary tract symptoms: Secondary | ICD-10-CM | POA: Diagnosis not present

## 2017-09-29 DIAGNOSIS — N318 Other neuromuscular dysfunction of bladder: Secondary | ICD-10-CM | POA: Diagnosis not present

## 2017-10-07 DIAGNOSIS — N401 Enlarged prostate with lower urinary tract symptoms: Secondary | ICD-10-CM | POA: Diagnosis not present

## 2017-10-20 DIAGNOSIS — N3020 Other chronic cystitis without hematuria: Secondary | ICD-10-CM | POA: Diagnosis not present

## 2017-10-20 DIAGNOSIS — N401 Enlarged prostate with lower urinary tract symptoms: Secondary | ICD-10-CM | POA: Diagnosis not present

## 2017-10-20 DIAGNOSIS — N318 Other neuromuscular dysfunction of bladder: Secondary | ICD-10-CM | POA: Diagnosis not present

## 2017-11-16 DIAGNOSIS — N318 Other neuromuscular dysfunction of bladder: Secondary | ICD-10-CM | POA: Diagnosis not present

## 2017-11-16 DIAGNOSIS — N3020 Other chronic cystitis without hematuria: Secondary | ICD-10-CM | POA: Diagnosis not present

## 2018-02-03 ENCOUNTER — Other Ambulatory Visit: Payer: Self-pay | Admitting: Internal Medicine

## 2018-03-17 ENCOUNTER — Other Ambulatory Visit: Payer: Self-pay | Admitting: Internal Medicine

## 2018-03-17 DIAGNOSIS — C672 Malignant neoplasm of lateral wall of bladder: Secondary | ICD-10-CM | POA: Diagnosis not present

## 2018-03-17 DIAGNOSIS — N3020 Other chronic cystitis without hematuria: Secondary | ICD-10-CM | POA: Diagnosis not present

## 2018-03-18 DIAGNOSIS — Z85828 Personal history of other malignant neoplasm of skin: Secondary | ICD-10-CM | POA: Diagnosis not present

## 2018-03-18 DIAGNOSIS — D2272 Melanocytic nevi of left lower limb, including hip: Secondary | ICD-10-CM | POA: Diagnosis not present

## 2018-03-18 DIAGNOSIS — L821 Other seborrheic keratosis: Secondary | ICD-10-CM | POA: Diagnosis not present

## 2018-03-18 DIAGNOSIS — L570 Actinic keratosis: Secondary | ICD-10-CM | POA: Diagnosis not present

## 2018-03-18 DIAGNOSIS — D225 Melanocytic nevi of trunk: Secondary | ICD-10-CM | POA: Diagnosis not present

## 2018-03-18 DIAGNOSIS — B351 Tinea unguium: Secondary | ICD-10-CM | POA: Diagnosis not present

## 2018-03-24 ENCOUNTER — Encounter: Payer: Medicare Other | Admitting: Internal Medicine

## 2018-04-04 ENCOUNTER — Other Ambulatory Visit (INDEPENDENT_AMBULATORY_CARE_PROVIDER_SITE_OTHER): Payer: Medicare Other

## 2018-04-04 ENCOUNTER — Ambulatory Visit (INDEPENDENT_AMBULATORY_CARE_PROVIDER_SITE_OTHER): Payer: Medicare Other | Admitting: Internal Medicine

## 2018-04-04 ENCOUNTER — Encounter: Payer: Self-pay | Admitting: Internal Medicine

## 2018-04-04 VITALS — BP 126/88 | HR 86 | Temp 98.1°F | Ht 72.0 in | Wt 176.0 lb

## 2018-04-04 DIAGNOSIS — I739 Peripheral vascular disease, unspecified: Secondary | ICD-10-CM | POA: Diagnosis not present

## 2018-04-04 DIAGNOSIS — M21612 Bunion of left foot: Secondary | ICD-10-CM

## 2018-04-04 DIAGNOSIS — I10 Essential (primary) hypertension: Secondary | ICD-10-CM

## 2018-04-04 DIAGNOSIS — C61 Malignant neoplasm of prostate: Secondary | ICD-10-CM

## 2018-04-04 DIAGNOSIS — M545 Low back pain: Secondary | ICD-10-CM

## 2018-04-04 DIAGNOSIS — M21611 Bunion of right foot: Secondary | ICD-10-CM

## 2018-04-04 DIAGNOSIS — M5442 Lumbago with sciatica, left side: Secondary | ICD-10-CM

## 2018-04-04 DIAGNOSIS — S80211A Abrasion, right knee, initial encounter: Secondary | ICD-10-CM

## 2018-04-04 DIAGNOSIS — M79604 Pain in right leg: Secondary | ICD-10-CM | POA: Insufficient documentation

## 2018-04-04 DIAGNOSIS — Z0001 Encounter for general adult medical examination with abnormal findings: Secondary | ICD-10-CM | POA: Diagnosis not present

## 2018-04-04 DIAGNOSIS — Z0000 Encounter for general adult medical examination without abnormal findings: Secondary | ICD-10-CM | POA: Diagnosis not present

## 2018-04-04 DIAGNOSIS — R296 Repeated falls: Secondary | ICD-10-CM

## 2018-04-04 DIAGNOSIS — E538 Deficiency of other specified B group vitamins: Secondary | ICD-10-CM | POA: Diagnosis not present

## 2018-04-04 DIAGNOSIS — R739 Hyperglycemia, unspecified: Secondary | ICD-10-CM

## 2018-04-04 DIAGNOSIS — G609 Hereditary and idiopathic neuropathy, unspecified: Secondary | ICD-10-CM | POA: Diagnosis not present

## 2018-04-04 DIAGNOSIS — G8929 Other chronic pain: Secondary | ICD-10-CM

## 2018-04-04 DIAGNOSIS — R269 Unspecified abnormalities of gait and mobility: Secondary | ICD-10-CM

## 2018-04-04 DIAGNOSIS — M79605 Pain in left leg: Secondary | ICD-10-CM

## 2018-04-04 DIAGNOSIS — S80212A Abrasion, left knee, initial encounter: Secondary | ICD-10-CM

## 2018-04-04 DIAGNOSIS — E559 Vitamin D deficiency, unspecified: Secondary | ICD-10-CM

## 2018-04-04 DIAGNOSIS — M67431 Ganglion, right wrist: Secondary | ICD-10-CM | POA: Insufficient documentation

## 2018-04-04 DIAGNOSIS — M5441 Lumbago with sciatica, right side: Secondary | ICD-10-CM

## 2018-04-04 LAB — CBC WITH DIFFERENTIAL/PLATELET
Basophils Absolute: 0.1 10*3/uL (ref 0.0–0.1)
Basophils Relative: 1 % (ref 0.0–3.0)
Eosinophils Absolute: 0.1 10*3/uL (ref 0.0–0.7)
Eosinophils Relative: 1.5 % (ref 0.0–5.0)
HCT: 42.7 % (ref 39.0–52.0)
Hemoglobin: 14.3 g/dL (ref 13.0–17.0)
Lymphocytes Relative: 21.5 % (ref 12.0–46.0)
Lymphs Abs: 1.4 10*3/uL (ref 0.7–4.0)
MCHC: 33.5 g/dL (ref 30.0–36.0)
MCV: 95.8 fl (ref 78.0–100.0)
MONO ABS: 0.5 10*3/uL (ref 0.1–1.0)
Monocytes Relative: 8.2 % (ref 3.0–12.0)
Neutro Abs: 4.3 10*3/uL (ref 1.4–7.7)
Neutrophils Relative %: 67.8 % (ref 43.0–77.0)
Platelets: 216 10*3/uL (ref 150.0–400.0)
RBC: 4.46 Mil/uL (ref 4.22–5.81)
RDW: 13.5 % (ref 11.5–15.5)
WBC: 6.3 10*3/uL (ref 4.0–10.5)

## 2018-04-04 LAB — BASIC METABOLIC PANEL
BUN: 10 mg/dL (ref 6–23)
CO2: 30 mEq/L (ref 19–32)
Calcium: 9.5 mg/dL (ref 8.4–10.5)
Chloride: 102 mEq/L (ref 96–112)
Creatinine, Ser: 0.85 mg/dL (ref 0.40–1.50)
GFR: 86.13 mL/min (ref >60.00)
Glucose, Bld: 102 mg/dL — ABNORMAL HIGH (ref 70–99)
POTASSIUM: 4.7 meq/L (ref 3.5–5.1)
Sodium: 139 mEq/L (ref 135–145)

## 2018-04-04 LAB — LIPID PANEL
Cholesterol: 186 mg/dL (ref 0–200)
HDL: 56.4 mg/dL (ref >39.00)
LDL Cholesterol: 106 mg/dL — ABNORMAL HIGH (ref 0–99)
NonHDL: 129.25
Total CHOL/HDL Ratio: 3
Triglycerides: 117 mg/dL (ref 0.0–149.0)
VLDL: 23.4 mg/dL (ref 0.0–40.0)

## 2018-04-04 LAB — HEPATIC FUNCTION PANEL
ALT: 11 U/L (ref 0–53)
AST: 20 U/L (ref 0–37)
Albumin: 4.4 g/dL (ref 3.5–5.2)
Alkaline Phosphatase: 73 U/L (ref 39–117)
BILIRUBIN DIRECT: 0.1 mg/dL (ref 0.0–0.3)
BILIRUBIN TOTAL: 0.6 mg/dL (ref 0.2–1.2)
Total Protein: 7.4 g/dL (ref 6.0–8.3)

## 2018-04-04 LAB — TSH: TSH: 3.06 u[IU]/mL (ref 0.35–4.50)

## 2018-04-04 LAB — HEMOGLOBIN A1C: Hgb A1c MFr Bld: 5.4 % (ref 4.6–6.5)

## 2018-04-04 MED ORDER — OMEPRAZOLE 40 MG PO CPDR: 40.0000 mg | DELAYED_RELEASE_CAPSULE | Freq: Every day | ORAL | 3 refills | Status: DC

## 2018-04-04 MED ORDER — AMLODIPINE BESYLATE 2.5 MG PO TABS: 2.5000 mg | ORAL_TABLET | Freq: Every day | ORAL | 3 refills | Status: DC

## 2018-04-04 NOTE — Assessment & Plan Note (Signed)
Also has hx of PAD and decresaed RLE pulse, for arterial dopplers

## 2018-04-04 NOTE — Assessment & Plan Note (Addendum)
F/u b12  In addition to the time spent performing CPE, I spent an additional 25 minutes face to face,in which greater than 50% of this time was spent in counseling and coordination of care for patient's acute illness as documented, including the differential dx, treatment, further evaluation and other management of b12 deficiency, prostate ca, PAD, hyperglycemis, peripheral neuropathy, HTN, recurrent falls, right wrist ganglion cysts, gait disorder, chronic low back pain, bilat leg pain, bilat feet bunions, and bilat knee abrasions

## 2018-04-04 NOTE — Assessment & Plan Note (Signed)
With persistent pain and bilat LE pain, for LS spine MRI, consider ortho referral

## 2018-04-04 NOTE — Assessment & Plan Note (Signed)
?   Elements related to lumbar dz and distal peripheral neuropathy, for b12

## 2018-04-04 NOTE — Assessment & Plan Note (Signed)

## 2018-04-04 NOTE — Assessment & Plan Note (Signed)
To cont lupron, f/u urology as plannee

## 2018-04-04 NOTE — Assessment & Plan Note (Signed)
D/w wife, would not pursue podiatry for now unless worsening

## 2018-04-04 NOTE — Assessment & Plan Note (Signed)
stable overall by history and exam, recent data reviewed with pt, and pt to continue medical treatment as before,  to f/u any worsening symptoms or concerns  

## 2018-04-04 NOTE — Assessment & Plan Note (Signed)
For cane DME

## 2018-04-04 NOTE — Assessment & Plan Note (Signed)
No s/s cellulitis, for cane with ambulation,  to f/u any worsening symptoms or concerns

## 2018-04-04 NOTE — Progress Notes (Signed)
Subjective:    Patient ID: Stephen Meza, male    DOB: 14-Jul-1933, 85 y.o.   MRN: [id number]  HPI    Here for wellness and f/u with wife.   Pt denies Chest pain, worsening SOB, DOE, wheezing, orthopnea, PND, worsening LE edema, palpitations, dizziness or syncope.  Pt denies neurological change such as new headache, facial or extremity weakness.  Pt denies polydipsia, polyuria, or low sugar symptoms. Pt states overall good compliance with treatment and medications, good tolerability, and has been trying to follow appropriate diet.  Pt denies worsening depressive symptoms, suicidal ideation or panic. No fever, night sweats, wt loss, loss of appetite, or other constitutional symptoms.  Pt states fair ability with ADL's, has low to mod fall risk, home safety reviewed and adequate, no other significant changes in hearing or vision, and not active with exercise. Overall has been declining in the past yr with more off balance, gait difficulty, LBP in the setting known peripheral neuropathy, knee arthritis and PAD. Also getting lupron every 4 mo and wife wondering if causing some weakness with less testosterone as well.  Pt continues to have recurring LBP x 2 yrs without bowel or bladder change, fever, wt loss,  worsening LE numbness/weakness, but does have bilat LE pain to ambulate Past Medical History:  Diagnosis Date  . DUODENITIS, WITHOUT HEMORRHAGE 07/14/2007  . ERECTILE DYSFUNCTION 07/14/2007  . Esophageal stricture 03/19/2016  . ESOPHAGITIS 07/14/2007  . EXTERNAL HEMORRHOIDS 07/14/2007  . GERD 07/14/2007  . Gout   . HYPERTENSION 07/14/2007  . PERIPHERAL NEUROPATHY 07/14/2007  . PERIPHERAL VASCULAR DISEASE 07/14/2007  . PODAGRA 07/14/2007  . Prostate cancer ([place]) 02/2012   pt had screening and then biopsy  . Retinal tear of right eye 03/19/2016  . TOBACCO ABUSE, HX OF 02/12/2010  . TONSILLECTOMY, HX OF 07/14/2007  . TREMOR, ESSENTIAL 07/14/2007  . VITAMIN B12 DEFICIENCY 07/14/2007   Past Surgical  History:  Procedure Laterality Date  . Excision of lipoma    . PROSTATE BIOPSY Bilateral 03/10/2012  . surgery to reduce turbinate and straighten deviated septum    . TONSILLECTOMY      reports that he quit smoking about 24 years ago. He has quit using smokeless tobacco. He reports current alcohol use of about 30.0 standard drinks of alcohol per week. He reports that he does not use drugs. family history includes Breast cancer in an other family member; Hypothyroidism in his daughter. Allergies  Allergen Reactions  . Nitrofuran Derivatives Rash  . Ciprofloxacin Hcl Other (See Comments)    numbness   Current Outpatient Medications on File Prior to Visit  Medication Sig Dispense Refill  . allopurinol (ZYLOPRIM) 100 MG tablet TAKE 1 TABLET BY MOUTH TWICE A DAY FOR GOUT 180 tablet 4  . colchicine 0.6 MG tablet TAKE 2 TABLETS AT THE START OF GOUT FLARE.MAY REPEAT 1 TAB 2 HOURS LATER. LIMIT 3 TABS IN 24 HOURS. 9 tablet 3  . cyanocobalamin (,VITAMIN B-12,) 1000 MCG/ML injection INJECT 1 ML INTO THE MUSCLE EVERY 30 DAYS 3 mL 0  . docusate sodium (COLACE) 100 MG capsule Take 100 mg by mouth once.    . finasteride (PROSCAR) 5 MG tablet Take 5 mg by mouth daily.  5  . gabapentin (NEURONTIN) 300 MG capsule Take 300 mg by mouth 3 (three) times daily.    Marland Kitchen Leuprolide Acetate, 6 Month, (LUPRON DEPOT, 82-MONTH, IM) Inject into the muscle every 6 (six) months.     No current facility-administered medications on  file prior to visit.    Review of Systems  Constitutional: Negative for other unusual diaphoresis or sweats HENT: Negative for ear discharge or swelling Eyes: Negative for other worsening visual disturbances Respiratory: Negative for stridor or other swelling  Gastrointestinal: Negative for worsening distension or other blood Genitourinary: Negative for retention or other urinary change Musculoskeletal: Negative for other MSK pain or swelling Skin: Negative for color change or other new  lesions Neurological: Negative for worsening tremors and other numbness  Psychiatric/Behavioral: Negative for worsening agitation or other fatigue All other system neg per pt    Objective:   Physical Exam BP 126/88   Pulse 86   Temp 98.1 F (36.7 C) (Oral)   Ht 6' (1.829 m)   Wt 176 lb (79.8 kg)   SpO2 99%   BMI 23.87 kg/m  VS noted,  Constitutional: Pt appears in NAD HENT: Head: NCAT.  Right Ear: External ear normal.  Left Ear: External ear normal.  Eyes: . Pupils are equal, round, and reactive to light. Conjunctivae and EOM are normal Nose: without d/c or deformity Neck: Neck supple. Gross normal ROM Cardiovascular: Normal rate and regular rhythm.   Pulmonary/Chest: Effort normal and breath sounds without rales or wheezing.  Abd:  Soft, NT, ND, + BS, no  Small right wrist ganglion cyst mild tender Neurological: Pt is alert. At baseline orientation, motor grossly intact, has decreased sensation to bilat feet to LT Skin: Skin is warm. No rashes, + new abrasions to bilat patellar areas, minimal LE edema bilat with multiple varicosities Psychiatric: Pt behavior is normal without agitation  Trace RLE dorsalis pedis pulse only Lab Results  Component Value Date   WBC 6.3 04/04/2018   HGB 14.3 04/04/2018   HCT 42.7 04/04/2018   PLT 216.0 04/04/2018   GLUCOSE 102 (H) 04/04/2018   CHOL 186 04/04/2018   TRIG 117.0 04/04/2018   HDL 56.40 04/04/2018   LDLDIRECT 93.0 03/15/2014   LDLCALC 106 (H) 04/04/2018   ALT 11 04/04/2018   AST 20 04/04/2018   NA 139 04/04/2018   K 4.7 04/04/2018   CL 102 04/04/2018   CREATININE 0.85 04/04/2018   BUN 10 04/04/2018   CO2 30 04/04/2018   TSH 3.06 04/04/2018   PSA 3.54 02/12/2010   HGBA1C 5.4 04/04/2018      Assessment & Plan:

## 2018-04-04 NOTE — Assessment & Plan Note (Signed)
Mild, improving, consider hand surgury referral

## 2018-04-04 NOTE — Patient Instructions (Signed)
.  OK to take the Tylenol 650 mg  - 1 every 8 hrs if needed  Please continue all other medications as before, and refills have been done if requested.  Please have the pharmacy call with any other refills you may need.  Please continue your efforts at being more active, low cholesterol diet, and weight control.  You are otherwise up to date with prevention measures today.  Please keep your appointments with your specialists as you may have planned - urology  You will be contacted regarding the referral for: MRI for lumbar spine, and Artery Circulation testing (ultrasound)  You are given the prescription for the C[facility] to take to a local Medical [facility] store  We would need to consider outpatient Physical Therapy depending on the MRI and leg circulation testing  Please go to the LAB in the Basement (turn left off the elevator) for the tests to be done today  You will be contacted by phone if any changes need to be made immediately.  Otherwise, you will receive a letter about your results with an explanation, but please check with MyChart first.  Please remember to sign up for MyChart if you have not done so, as this will be important to you in the future with finding out test results, communicating by private email, and scheduling acute appointments online when needed.  Please return in 6 months, or sooner if needed

## 2018-04-04 NOTE — Assessment & Plan Note (Signed)
Consider PT but will need MRI LS spine first

## 2018-04-04 NOTE — Assessment & Plan Note (Signed)
For LE dopplers to further assess due to bilat leg pain

## 2018-04-21 ENCOUNTER — Ambulatory Visit (HOSPITAL_COMMUNITY)
Admission: RE | Admit: 2018-04-21 | Discharge: 2018-04-21 | Disposition: A | Discharge status: Home / Self Care | Payer: Medicare Other | Source: Ambulatory Visit | Attending: Cardiology | Admitting: Cardiology

## 2018-04-21 ENCOUNTER — Encounter: Payer: Self-pay | Admitting: Internal Medicine

## 2018-04-21 DIAGNOSIS — I739 Peripheral vascular disease, unspecified: Secondary | ICD-10-CM | POA: Diagnosis not present

## 2018-04-21 DIAGNOSIS — M79605 Pain in left leg: Secondary | ICD-10-CM | POA: Diagnosis not present

## 2018-04-21 DIAGNOSIS — M79604 Pain in right leg: Secondary | ICD-10-CM | POA: Insufficient documentation

## 2018-04-21 DIAGNOSIS — R202 Paresthesia of skin: Secondary | ICD-10-CM

## 2018-05-02 ENCOUNTER — Ambulatory Visit
Admission: RE | Admit: 2018-05-02 | Discharge: 2018-05-02 | Disposition: A | Discharge status: Home / Self Care | Payer: Medicare Other | Source: Ambulatory Visit | Attending: Internal Medicine | Admitting: Internal Medicine

## 2018-05-02 DIAGNOSIS — M5442 Lumbago with sciatica, left side: Principal | ICD-10-CM

## 2018-05-02 DIAGNOSIS — R269 Unspecified abnormalities of gait and mobility: Secondary | ICD-10-CM

## 2018-05-02 DIAGNOSIS — M5441 Lumbago with sciatica, right side: Principal | ICD-10-CM

## 2018-05-02 DIAGNOSIS — M48061 Spinal stenosis, lumbar region without neurogenic claudication: Secondary | ICD-10-CM | POA: Diagnosis not present

## 2018-05-02 DIAGNOSIS — G8929 Other chronic pain: Secondary | ICD-10-CM

## 2018-05-04 ENCOUNTER — Encounter: Payer: Self-pay | Admitting: Internal Medicine

## 2018-05-05 ENCOUNTER — Encounter: Payer: Self-pay | Admitting: Neurology

## 2018-06-20 ENCOUNTER — Encounter: Payer: Self-pay | Admitting: Neurology

## 2018-07-15 ENCOUNTER — Ambulatory Visit: Payer: Medicare Other | Admitting: Neurology

## 2018-07-16 ENCOUNTER — Other Ambulatory Visit: Payer: Self-pay | Admitting: Internal Medicine

## 2018-08-01 ENCOUNTER — Other Ambulatory Visit: Payer: Medicare Other

## 2018-08-01 ENCOUNTER — Other Ambulatory Visit: Payer: Self-pay

## 2018-08-01 ENCOUNTER — Telehealth: Payer: Self-pay | Admitting: Neurology

## 2018-08-01 ENCOUNTER — Encounter: Payer: Self-pay | Admitting: Neurology

## 2018-08-01 ENCOUNTER — Ambulatory Visit (INDEPENDENT_AMBULATORY_CARE_PROVIDER_SITE_OTHER): Payer: Medicare Other | Admitting: Neurology

## 2018-08-01 VITALS — BP 116/68 | Ht 72.0 in | Wt 180.0 lb

## 2018-08-01 DIAGNOSIS — R413 Other amnesia: Secondary | ICD-10-CM

## 2018-08-01 DIAGNOSIS — R278 Other lack of coordination: Secondary | ICD-10-CM

## 2018-08-01 DIAGNOSIS — R482 Apraxia: Secondary | ICD-10-CM | POA: Diagnosis not present

## 2018-08-01 DIAGNOSIS — G621 Alcoholic polyneuropathy: Secondary | ICD-10-CM

## 2018-08-01 LAB — SEDIMENTATION RATE: Sed Rate: 19 mm/h (ref 0–20)

## 2018-08-01 NOTE — Progress Notes (Signed)
[facility] Clinic Note - Initial Visit   Date: 08/01/18  Stephen Meza MRN: [id number] DOB: [birth year]   Dear Dr. Jenny Reichmann:  Thank you for your kind referral of Stephen Meza for consultation of gait unsteadiness and leg numbness. Although his history is well known to you, please allow [place] to reiterate it for the purpose of our medical record. The patient was accompanied to the clinic by wife who also provides collateral information.     History of Present Illness: Stephen Meza is a 85 y.o. right-handed male with gout, hypertension, GERD, and history of bladder and prostate cancer presenting for evaluation of gait unsteadiness and bilateral leg numbness.   Starting around 2007, he began having numbness on the feet which gradually moved up the legs above his knees.  He also complains of numbness/tingling in the hands.  He has imbalance for 2 years and walks unassisted.  He suffered one fall this year when getting up to use the bathroom at night.  He denies any weakness of the legs and stays very active maintaining his yard/garden on 3 acres of land.    He had history of alcohol abuse and was drinking liquor and get through a fifth liquor every 2-3 days. He continued this until 1986 when he went through detox and stopped drinking liquor, however started drinking beer almost nightly.  He has been drinking 6-10 beers nightly for nearly 30 years.  He quit alcohol consumption in January 2020.    He is having issues with remembering how to perform tasks such as turning on the computer or the vacuum.  His wife also states that he was unable to remember how to turn the alarm off.  He drives without getting lost.  He tends to repeating himself when talking.    Out-side paper records, electronic medical record, and images have been reviewed where available and summarized as:  MRI lumbar spine 05/02/2018: 1. Unchanged appearance of compression deformity of L1  compared to 04/27/2017. 2. Multilevel degenerative disc disease without spinal canal stenosis. 3. Moderate right L1 neural foraminal stenosis. 4. 3.8 cm infrarenal abdominal aortic aneurysm. Recommend followup by ultrasound in 2 years. This recommendation follows ACR consensus guidelines: White Paper of the ACR Incidental Findings Committee II on Vascular Findings. J Am Coll Radiol 2013; 10:789-794. Aortic aneurysm NOS (ICD10-I71.9)  ABI 04/21/2018: Normal bilaterally  Lab Results  Component Value Date   HGBA1C 5.4 04/04/2018   Lab Results  Component Value Date   [id number] 086 05/01/2013   Lab Results  Component Value Date   TSH 3.06 04/04/2018   No results found for: ESRSEDRATE, POCTSEDRATE  Past Medical History:  Diagnosis Date   DUODENITIS, WITHOUT HEMORRHAGE 07/14/2007   ERECTILE DYSFUNCTION 07/14/2007   Esophageal stricture 03/19/2016   ESOPHAGITIS 07/14/2007   EXTERNAL HEMORRHOIDS 07/14/2007   GERD 07/14/2007   Gout    HYPERTENSION 07/14/2007   PERIPHERAL NEUROPATHY 07/14/2007   PERIPHERAL VASCULAR DISEASE 07/14/2007   PODAGRA 07/14/2007   Prostate cancer ([place]) 02/2012   pt had screening and then biopsy   Retinal tear of right eye 03/19/2016   TOBACCO ABUSE, HX OF 02/12/2010   TONSILLECTOMY, HX OF 07/14/2007   TREMOR, ESSENTIAL 07/14/2007   VITAMIN B12 DEFICIENCY 07/14/2007    Past Surgical History:  Procedure Laterality Date   Excision of lipoma     PROSTATE BIOPSY Bilateral 03/10/2012   surgery to reduce turbinate and straighten deviated septum  2016   TONSILLECTOMY  Medications:  Outpatient Encounter Medications as of 08/01/2018  Medication Sig Note   allopurinol (ZYLOPRIM) 100 MG tablet TAKE 1 TABLET BY MOUTH TWICE A DAY FOR GOUT    amLODipine (NORVASC) 2.5 MG tablet TAKE 1 TABLET (2.5 MG TOTAL) BY MOUTH DAILY.    colchicine 0.6 MG tablet TAKE 2 TABLETS AT THE START OF GOUT FLARE.MAY REPEAT 1 TAB 2 HOURS LATER. LIMIT 3 TABS IN 24 HOURS.      cyanocobalamin (,VITAMIN B-12,) 1000 MCG/ML injection INJECT 1 ML INTO THE MUSCLE EVERY 30 DAYS    Cyanocobalamin (B-12) 3000 MCG CAPS Take by mouth daily.    docusate sodium (COLACE) 100 MG capsule Take 100 mg by mouth once.    Leuprolide Acetate, 6 Month, (LUPRON DEPOT, 60-MONTH, IM) Inject into the muscle every 6 (six) months.    omeprazole (PRILOSEC) 40 MG capsule Take 1 capsule (40 mg total) by mouth daily.    [DISCONTINUED] finasteride (PROSCAR) 5 MG tablet Take 5 mg by mouth daily. 08/31/2014: Received from: External Pharmacy   [DISCONTINUED] gabapentin (NEURONTIN) 300 MG capsule Take 300 mg by mouth 3 (three) times daily.    No facility-administered encounter medications on file as of 08/01/2018.     Allergies:  Allergies  Allergen Reactions   Nitrofuran Derivatives Rash   Ciprofloxacin Hcl Other (See Comments)    numbness    Family History: Family History  Problem Relation Age of Onset   Hypothyroidism Daughter    Breast cancer Other    Cancer Mother    Other Father    Breast cancer Sister     Social History: Social History   Tobacco Use   Smoking status: Former Smoker    Last attempt to quit: 02/23/1994    Years since quitting: 24.4   Smokeless tobacco: Former Systems developer  Substance Use Topics   Alcohol use: Not Currently    Alcohol/week: 30.0 standard drinks    Types: 30 Cans of beer per week    Comment: d/c beer about 5 months ago   Drug use: No   Social History   Social History Narrative   HSG. Married '1958. 2 dtrs; 4 g-children. Work - retired. 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       Not disabled      Retired since 1986      Right handed    Review of Systems:  CONSTITUTIONAL: No fevers, chills, night sweats, or weight loss.  EYES: No visual changes or eye pain ENT: No hearing changes.  No history of nose bleeds.   RESPIRATORY: No cough, wheezing and shortness of  breath.   CARDIOVASCULAR: Negative for chest pain, and palpitations.   GI: Negative for abdominal discomfort, blood in stools or black stools.  No recent change in bowel habits.   GU:  No history of incontinence.   MUSCLOSKELETAL: +history of joint pain or swelling.  No myalgias.   SKIN: Negative for lesions, rash, and itching.   HEMATOLOGY/ONCOLOGY: Negative for prolonged bleeding, bruising easily, and swollen nodes.  +history of cancer.   ENDOCRINE: Negative for cold or heat intolerance, polydipsia or goiter.   PSYCH:  No depression or anxiety symptoms.   NEURO: As Above.   Vital Signs:  BP 116/68    Ht 6' (1.829 m)    Wt 180 lb (81.6 kg)    BMI 24.41 kg/m    General Medical Exam:   General:  Well appearing,  comfortable.   Eyes/ENT: see cranial nerve examination.   Neck:   No carotid bruits. Respiratory:  Clear to auscultation, good air entry bilaterally.   Cardiac:  Regular rate and rhythm, no murmur.   Extremities:  Bunion on both feet, causing deformity Skin:  No rashes or lesions.  Neurological Exam: MENTAL STATUS including orientation to time, place, person, recent and remote memory, attention span and concentration, language, and fund of knowledge is fair.  Speech is not dysarthric.    Montreal Cognitive Assessment  08/01/2018  Visuospatial/ Executive (0/5) 3  Naming (0/3) 2  Attention: Read list of digits (0/2) 2  Attention: Read list of letters (0/1) 1  Attention: Serial 7 subtraction starting at 100 (0/3) 3  Language: Repeat phrase (0/2) 1  Language : Fluency (0/1) 0  Abstraction (0/2) 0  Delayed Recall (0/5) 0  Orientation (0/6) 5  Total 17  Adjusted Score (based on education) 18    CRANIAL NERVES: II:  No visual field defects.  Unremarkable fundi.   III-IV-VI: Pupils equal round and reactive to light.  Normal conjugate, extra-ocular eye movements in all directions of gaze.  No nystagmus.  No ptosis.   V:  Normal facial sensation.    VII:  Normal facial  symmetry and movements.   VIII:  Normal hearing and vestibular function.   IX-X:  Normal palatal movement.   XI:  Normal shoulder shrug and head rotation.   XII:  Normal tongue strength and range of motion, no deviation or fasciculation.  MOTOR:  No atrophy, fasciculations or abnormal movements.  No pronator drift.   Upper Extremity:  Right  Left  Deltoid  5/5   5/5   Biceps  5/5   5/5   Triceps  5/5   5/5   Infraspinatus 5/5  5/5  Medial pectoralis 5/5  5/5  Wrist extensors  5/5   5/5   Wrist flexors  5/5   5/5   Finger extensors  5/5   5/5   Finger flexors  5/5   5/5   Dorsal interossei  5/5   5/5   Abductor pollicis  5/5   5/5   Tone (Ashworth scale)  0  0   Lower Extremity:  Right  Left  Hip flexors  5/5   5/5   Hip extensors  5/5   5/5   Adductor 5/5  5/5  Abductor 5/5  5/5  Knee flexors  5/5   5/5   Knee extensors  5/5   5/5   Dorsiflexors  5/5   5/5   Plantarflexors  5/5   5/5   Toe extensors  5/5   5/5   Toe flexors  5/5   5/5   Tone (Ashworth scale)  0  0   MSRs:  Right        Left                  brachioradialis 2+  2+  biceps 2+  2+  triceps 2+  2+  patellar 0  0  ankle jerk 0  0  Hoffman no  no  plantar response mute  mute   SENSORY:  Vibration is reduced at the knee and absent below the ankles.  Pin prick and temperature is reduced over a gradiant pattern below the lower legs.  Absent proprioception at the great toe bilaterally. Marked sway with Rhomberg testing, limiting ability to even close the eyes.  COORDINATION/GAIT: Normal finger-to- nose-finger.  Intact rapid alternating movements  bilaterally. Gait is wide-based, unsteady with turning, unassisted.  Stressed and tandem gait not tested due to instability.    IMPRESSION: 1.  Alcoholic peripheral neuropathy manifesting with numbness, sensory ataxia.  He has long history of alcohol abuse (~50 yr) and has been sober since February 2020.  I discussed that chronic alcohol use is neurotoxic and is the  2nd most common cause of neuropathy.  He does not have weakness, but there is prominent sensory ataxia and stocking-glove distribution of numbness.   NCS/EMG is not indicated given the classic history and exam. I personally viewed his MRI lumbar spine which shows degenerative changes, none of which is causing nerve impingement. I explained that avoiding alcohol will minimize progression, however, there is no means to reverse nerve injury and management is supportive.  Alcohol abuse can lead to nutrient deficiency which further can lead to neuropathy.  I will check a few labs for secondary causes of neuropathy as noted below.  2.  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 He scored poorly on his MOCA today with deficits involving all domains. Again, the most likely etiology for his cognitive dysfunction is related to alcohol use.  I will get MRI brain and he most likely will need formal neuropsychological testing in the future, this was declined at today's visit.    PLAN/RECOMMENDATIONS:  1.  Check ESR, vitamin B12, vitamin B1, folate, SPEP with IFE, copper 2.  MRI brain without contrast 3.  Start physical therapy for balance training  4.  With his proprioceptive loss in the feet, I stressed the importance of limiting driving and avoiding it all together if he cannot safely manipulate the pedals.   5.  Encouraged to use cane and shower chair to minimize risk of falls 6.  Educated on daily foot inspection  Return to clinic in 4 months.  Greater than 50% of this 60 minute visit was spent in counseling, explanation of diagnosis, planning of further management, and coordination of care.   Thank you for allowing me to participate in patient's care.  If I can answer any additional questions, I would be pleased to do so.    Sincerely,    Hilda Wexler K. Posey Pronto, DO

## 2018-08-01 NOTE — Patient Instructions (Addendum)
MRI brain without contrast  Start physical therapy for balance.  Please call my office with the name of the facility where you would like to go.   Check labs  Start to use a shower chair  Take extra caution when driving.  If you cannot feel the pedal to manipulate safe driving, best to avoid driving  Daily foot inspection  Start using a cane anytime you are on uneven ground  Return to clinic in 4 months  [facility]  Preventing Falls in the [place] are common, often dreaded events in the lives of older people. Aside from the obvious injuries and even death that may result, falls can cause wide-ranging consequences including loss of independence, mental decline, decreased activity, and mobility. Younger people are also at risk of falling, especially those with chronic illnesses and fatigue.  Ways to reduce the risk for falling:  * Examine diet and medications. Warm foods and alcohol dilate blood vessels, which can lead to dizziness when standing. Sleep aids, antidepressants, and pain medications can also increase the likelihood of a fall.  * Get a vison exam. Poor vision, cataracts, and glaucoma increase the chances of falling.  * Check foot gear. Shoes should fit snugly and have a sturdy, nonskid sole and broad, low heel.  * Participate in a physician-approved exercise program to build and maintain muscle strength and improve balance and coordination.  * Increase vitamin D intake. Vitamin D improves muscle strength and increases the amount of calcium the body is able to absorb and deposit in bones.  How to prevent falls from common hazards:  * Floors - Remove all loose wires, cords, and throw rugs. Minimize clutter. Make sure rugs are anchored and smooth. Keep furniture in its usual place.  * Chairs - Use chairs with straight backs, armrests, and firm seats. Add firm cushions to existing pieces to add height.  * Bathroom - Install grab bars and non-skid tape in the tub or  shower. Use a bathtub transfer bench or a shower chair with a back support. Use an elevated toilet seat and/or safety rails to assist standing from a low surface. Do not use towel racks or bathroom tissue holders to help you stand.  * Lighting - Make sure halls, stairways, and entrances are well-lit. Install a night light in your bathroom or hallway. Make sure there is a light switch at the top and bottom of the staircase. Turn lights on if you get up in the middle of the night. Make sure lamps or light switches are within reach of the bed if you have to get up during the night.  * Kitchen - Install non-skid rubber mats near the sink and stove. Clean spills immediately. Store frequently used utensils, pots, and pans between waist and eye level. This helps prevent reaching and bending. Sit when getting things out of the lower cupboards.  * Living room / [place] furniture with wide spaces in between, giving enough room to move around. Establish a route through the living room that gives you something to hold onto as you walk.  * Stairs - Make sure treads, rails, and rugs are secure. Install a rail on both sides of the stairs. If stairs are a threat, it might be helpful to arrange most of your activities on the lower level to reduce the number of times you must climb the stairs.  * Entrances and doorways - Install metal handles on the walls adjacent to the doorknobs of  all doors to make it more secure as you travel through the doorway.  Tips for maintaining balance:  * Keep at least one hand free at all times Try using a backpack or fanny pack to hold things rather than carrying them in your hands. Never carry objects in both hands when walking as this interferes with keeping your balance.  * Consciously lift your feet off the ground when walking. Shuffling and dragging of the feet is a common culprit in losing your balance.  * When trying to navigate turns, use a "U" technique of facing forward and  making a wide turn, rather than pivoting sharply.  * Try to stand with your feet shoulder-length apart. When your feet are close together for any length of time, you increase your risk of losing your balance and falling.  * Do one thing at a time. Do not try to walk and accomplish another task, such as reading or looking around. The decrease in your automatic reflexes complicates motor function, so the less distraction, the better.  * Do not wear rubber or gripping soled shoes, they might "catch" on the floor and cause tripping.  * Move slowly when changing positions. Use deliberate, concentrated movements and, if needed, use a grab bar or walking aid. Count fifteen (15) seconds after standing to begin walking.  * If balance is a continuous problem, you might want to consider a walking aid such as a cane, walking stick, or walker. Once you have mastered walking with help, you may be ready to try it again on your own.  This information is provided by [facility] and is not intended to replace the medical advice of your physician or other health care providers. Please consult your physician or other health care providers for advice regarding your specific medical condition.

## 2018-08-01 NOTE — Telephone Encounter (Signed)
Faxed Demo, ins card, OV note, CT scan to [facility]. Fax # [phone].  They will call pt and schedule an appt. I confirmed that they did accept pts insurance.

## 2018-08-01 NOTE — Telephone Encounter (Signed)
Wife is calling in with the Rehab in [place] name: [facility] phone: [phone].

## 2018-08-06 LAB — PROTEIN ELECTROPHORESIS, SERUM
Albumin ELP: 4.2 g/dL (ref 3.8–4.8)
Alpha 1: 0.3 g/dL (ref 0.2–0.3)
Alpha 2: 0.7 g/dL (ref 0.5–0.9)
Beta 2: 0.4 g/dL (ref 0.2–0.5)
Beta Globulin: 0.4 g/dL (ref 0.4–0.6)
Gamma Globulin: 0.9 g/dL (ref 0.8–1.7)
Total Protein: 7 g/dL (ref 6.1–8.1)

## 2018-08-06 LAB — IMMUNOFIXATION ELECTROPHORESIS
IgG (Immunoglobin G), Serum: 975 mg/dL (ref 600–1540)
IgM, Serum: 96 mg/dL (ref 50–300)
Immunoglobulin A: 375 mg/dL — ABNORMAL HIGH (ref 70–320)

## 2018-08-06 LAB — VITAMIN B1: Vitamin B1 (Thiamine): 12 nmol/L (ref 8–30)

## 2018-08-06 LAB — COPPER, SERUM: Copper: 114 ug/dL (ref 70–175)

## 2018-08-06 LAB — FOLATE: Folate: 11 ng/mL

## 2018-08-06 LAB — VITAMIN B12: Vitamin B-12: 1898 pg/mL — ABNORMAL HIGH (ref 200–1100)

## 2018-08-22 ENCOUNTER — Ambulatory Visit: Payer: Medicare Other | Admitting: Neurology

## 2018-09-12 ENCOUNTER — Ambulatory Visit
Admission: RE | Admit: 2018-09-12 | Discharge: 2018-09-12 | Disposition: A | Discharge status: Home / Self Care | Payer: Medicare Other | Source: Ambulatory Visit | Attending: Neurology | Admitting: Neurology

## 2018-09-12 ENCOUNTER — Other Ambulatory Visit: Payer: Self-pay

## 2018-09-12 DIAGNOSIS — R413 Other amnesia: Secondary | ICD-10-CM | POA: Diagnosis not present

## 2018-09-12 DIAGNOSIS — R410 Disorientation, unspecified: Secondary | ICD-10-CM | POA: Diagnosis not present

## 2018-09-12 NOTE — Telephone Encounter (Signed)
Fax rec'd from [facility] in [place] dated 09/07/2018 that they were not able to reach patient to schedule, despite multiple attempts to contact him.

## 2018-09-13 ENCOUNTER — Telehealth: Payer: Self-pay

## 2018-09-13 NOTE — Telephone Encounter (Signed)
-----   Message from Stephen Berthold, DO sent at 09/13/2018  2:50 PM EDT ----- Please inform patient that his MRI shows age-related changes with volume loss/shrinkage. This is likely due to combination of age, high blood pressure, and alcohol use.  I can go over his MRI images at his next visit with me. Thanks.

## 2018-09-13 NOTE — Telephone Encounter (Signed)
Patient notified of MRI results.  

## 2018-09-17 ENCOUNTER — Other Ambulatory Visit: Payer: Self-pay | Admitting: Internal Medicine

## 2018-09-27 DIAGNOSIS — N3020 Other chronic cystitis without hematuria: Secondary | ICD-10-CM | POA: Diagnosis not present

## 2018-09-27 DIAGNOSIS — C672 Malignant neoplasm of lateral wall of bladder: Secondary | ICD-10-CM | POA: Diagnosis not present

## 2018-10-03 ENCOUNTER — Ambulatory Visit: Payer: Medicare Other | Admitting: Internal Medicine

## 2018-11-21 ENCOUNTER — Telehealth: Payer: Self-pay | Admitting: Orthopedic Surgery

## 2018-11-21 ENCOUNTER — Other Ambulatory Visit: Payer: Self-pay | Admitting: Orthopedic Surgery

## 2018-11-21 MED ORDER — ALLOPURINOL 100 MG PO TABS: 100.0000 mg | ORAL_TABLET | Freq: Two times a day (BID) | ORAL | 4 refills | Status: DC

## 2018-11-21 NOTE — Telephone Encounter (Signed)
Patient's wife called. Would like Dr. Sharol Given to [facility] Her call back number is [phone]

## 2018-11-21 NOTE — Telephone Encounter (Signed)
Please advise Dr Sharol Given.

## 2018-11-21 NOTE — Telephone Encounter (Signed)
rx sent

## 2018-11-24 NOTE — Telephone Encounter (Signed)
Patient's wife called back requesting that the RX for the Allopurinol be [facility] [place] [facility] [address]  CB#[phone].  Thank you.

## 2018-11-25 ENCOUNTER — Other Ambulatory Visit: Payer: Self-pay

## 2018-11-25 MED ORDER — ALLOPURINOL 100 MG PO TABS: 100.0000 mg | ORAL_TABLET | Freq: Two times a day (BID) | ORAL | 4 refills | Status: DC

## 2018-11-25 NOTE — Telephone Encounter (Signed)
[facility]

## 2018-12-05 ENCOUNTER — Ambulatory Visit: Payer: Medicare Other | Admitting: Neurology

## 2018-12-05 ENCOUNTER — Encounter: Payer: Self-pay | Admitting: Neurology

## 2018-12-05 ENCOUNTER — Other Ambulatory Visit: Payer: Self-pay

## 2018-12-05 VITALS — BP 134/88 | HR 70 | Ht 72.0 in | Wt 181.0 lb

## 2018-12-05 DIAGNOSIS — G621 Alcoholic polyneuropathy: Secondary | ICD-10-CM | POA: Diagnosis not present

## 2018-12-05 DIAGNOSIS — R413 Other amnesia: Secondary | ICD-10-CM

## 2018-12-05 NOTE — Progress Notes (Signed)
Follow-up Visit   Date: 12/05/18   Stephen Meza MRN: [id number] DOB: [birth year]   Interim History: Stephen Franklin A. Fimbres is a 85 y.o. right-handed male with gout, hypertension, GERD, and history of bladder and prostate cancer returning to the clinic for follow-up of peripheral neuropathy, alcohol induced.  The patient was accompanied to the clinic by wife who also provides collateral information.    History of present illness: Starting around 2007, he began having numbness on the feet which gradually moved up the legs above his knees.  He also complains of numbness/tingling in the hands.  He has imbalance for 2 years and walks unassisted.  He suffered one fall this year when getting up to use the bathroom at night.  He denies any weakness of the legs and stays very active maintaining his yard/garden on 3 acres of land.    He had history of alcohol abuse and was drinking liquor and get through a fifth liquor every 2-3 days. He continued this until 1986 when he went through detox and stopped drinking liquor, however started drinking beer almost nightly.  He has been drinking 6-10 beers nightly for nearly 30 years.  He quit alcohol consumption in January 2020.    He is having issues with remembering how to perform tasks such as turning on the computer or the vacuum.  His wife also states that he was unable to remember how to turn the alarm off.  He drives without getting lost.  He tends to repeating himself when talking.    UPDATE 12/05/2018:  He is here for follow-up.  Wife says that he has not had any confusional spells.  Overall, he is doing relatively well stays very active at home, especially keeping up with their vegetable garden and land.  He reports planting 49 tomato plants this year.  They also have persimmon tree.  His wife manages medications, finances, and drives.  He manages a lot of household chores.  He did not wish to do physical therapy for balance.  He has not suffered  any falls and continues to walk unassisted.  They did get a shower chair for the bathroom, which has helped.  There has been no significant change in his neuropathy.  Medications:  Current Outpatient Medications on File Prior to Visit  Medication Sig Dispense Refill   allopurinol (ZYLOPRIM) 100 MG tablet TAKE 1 TABLET BY MOUTH TWICE A DAY FOR GOUT 180 tablet 4   allopurinol (ZYLOPRIM) 100 MG tablet Take 1 tablet (100 mg total) by mouth 2 (two) times daily. 180 tablet 4   amLODipine (NORVASC) 2.5 MG tablet TAKE 1 TABLET (2.5 MG TOTAL) BY MOUTH DAILY. 90 tablet 1   colchicine 0.6 MG tablet TAKE 2 TABLETS AT THE START OF GOUT FLARE.MAY REPEAT 1 TAB 2 HOURS LATER. LIMIT 3 TABS IN 24 HOURS. 9 tablet 3   cyanocobalamin (,VITAMIN B-12,) 1000 MCG/ML injection INJECT 1 ML INTO THE MUSCLE EVERY 30 DAYS 3 mL 0   Cyanocobalamin (B-12) 3000 MCG CAPS Take by mouth daily.     docusate sodium (COLACE) 100 MG capsule Take 100 mg by mouth once.     Leuprolide Acetate, 6 Month, (LUPRON DEPOT, 77-MONTH, IM) Inject into the muscle every 6 (six) months.     omeprazole (PRILOSEC) 40 MG capsule Take 1 capsule (40 mg total) by mouth daily. 90 capsule 3   No current facility-administered medications on file prior to visit.     Allergies:  Allergies  Allergen Reactions  Nitrofuran Derivatives Rash   Ciprofloxacin Hcl Other (See Comments)    numbness    Review of Systems:  CONSTITUTIONAL: No fevers, chills, night sweats, or weight loss.  EYES: No visual changes or eye pain ENT: No hearing changes.  No history of nose bleeds.   RESPIRATORY: No cough, wheezing and shortness of breath.   CARDIOVASCULAR: Negative for chest pain, and palpitations.   GI: Negative for abdominal discomfort, blood in stools or black stools.  No recent change in bowel habits.   GU:  No history of incontinence.   MUSCLOSKELETAL: No history of joint pain or swelling.  No myalgias.   SKIN: Negative for lesions, rash, and  itching.   ENDOCRINE: Negative for cold or heat intolerance, polydipsia or goiter.   PSYCH:  No depression or anxiety symptoms.   NEURO: As Above.   Vital Signs:  BP 134/88    Pulse 70    Ht 6' (1.829 m)    Wt 181 lb (82.1 kg)    SpO2 96%    BMI 24.55 kg/m    General Medical Exam:   General:  Well appearing, comfortable  Eyes/ENT: see cranial nerve examination.   Neck:  No carotid bruits. Respiratory:  Clear to auscultation, good air entry bilaterally.   Cardiac:  Regular rate and rhythm, no murmur.   Ext:  No edema   Neurological Exam: MENTAL STATUS including orientation to time, place, person, recent and remote memory, attention span and concentration, language, and fund of knowledge is fair.  Speech is not dysarthric. Montreal Cognitive Assessment  08/01/2018  Visuospatial/ Executive (0/5) 3  Naming (0/3) 2  Attention: Read list of digits (0/2) 2  Attention: Read list of letters (0/1) 1  Attention: Serial 7 subtraction starting at 100 (0/3) 3  Language: Repeat phrase (0/2) 1  Language : Fluency (0/1) 0  Abstraction (0/2) 0  Delayed Recall (0/5) 0  Orientation (0/6) 5  Total 17  Adjusted Score (based on education) 18    CRANIAL NERVES:  Pupils equal round and reactive to light.  Normal conjugate, extra-ocular eye movements in all directions of gaze.  No ptosis.  Face is symmetric  MOTOR:  Motor strength is 5/5 in all extremities.    MSRs:  Reflexes are 2+/4 in the upper extremities and absent in the legs..  SENSORY: Vibration is intact at the knees and absent distal to ankles bilaterally.Marland Kitchen  COORDINATION/GAIT: Gait appears mildly wide-based, unsteady with turns, unassisted  Data: MRI lumbar spine 05/02/2018: 1. Unchanged appearance of compression deformity of L1 compared to 04/27/2017. 2. Multilevel degenerative disc disease without spinal canal stenosis. 3. Moderate right L1 neural foraminal stenosis. 4. 3.8 cm infrarenal abdominal aortic aneurysm. Recommend followup  by ultrasound in 2 years. This recommendation follows ACR consensus guidelines: White Paper of the ACR Incidental Findings Committee II on Vascular Findings. J Am Coll Radiol 2013; 10:789-794. Aortic aneurysm NOS (ICD10-I71.9)  ABI 04/21/2018: Normal bilaterally  MRI brain without contrast 09/12/2018: 1. Chronic ischemic microangiopathy and generalized atrophy without lobar predilection. Quantitative, volumetric MRI of the brain may be helpful for more specific evaluation for characteristic atrophy patterns associated with dementia. 2. 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   IMPRESSION/PLAN: 1.  Cognitive impairment, likely early signs of dementia.  -MRI brain shows generalized atrophy  -Formal neurocognitive testing was declined, they will consider if his cognitive symptoms get worse  -Acetylcholinesterase inhibitor therapy was declined  -I praised him for staying active and encouraged him to continue to  do so  2.  Peripheral neuropathy, alcohol induced.  Symptoms manifesting with numbness in the feet and lower legs as well as sensory ataxia.  He has a long history of alcohol abuse (~50 yr) and has been sober since February 2020.   -Labs were reviewed and are normal.  -Patient educated on daily foot inspection, fall prevention, and safety precautions around the home.  Return to clinic as needed  Thank you for allowing me to participate in patient's care.  If I can answer any additional questions, I would be pleased to do so.    Sincerely,    Hassell Patras K. Posey Pronto, DO

## 2018-12-05 NOTE — Patient Instructions (Signed)
Use a cane whenever you are on uneven ground  If you notice any worsening memory changes, please come back and see me

## 2018-12-30 ENCOUNTER — Telehealth: Payer: Self-pay | Admitting: Internal Medicine

## 2018-12-30 NOTE — Telephone Encounter (Signed)
Patient is calling to request if Dr. Jenny Reichmann can [facility] [phone] for DTP Boostrix.   The patient had a [place] baby. Advised all that around the baby need to have this shot.  Please advise. Covered through the insurance.  CB with the patient- [phone]

## 2019-01-02 ENCOUNTER — Other Ambulatory Visit: Payer: Self-pay | Admitting: Internal Medicine

## 2019-01-04 ENCOUNTER — Ambulatory Visit: Payer: Medicare Other

## 2019-01-05 MED ORDER — TETANUS-DIPHTH-ACELL PERTUSSIS 5-2.5-18.5 LF-MCG/0.5 IM SUSP: 0.5000 mL | Freq: Once | INTRAMUSCULAR | 0 refills | Status: DC

## 2019-01-05 NOTE — Telephone Encounter (Signed)
For some reason it was able erx this time, no faxing needed

## 2019-01-05 NOTE — Addendum Note (Signed)
Addended by: Biagio Borg on: 01/05/2019 01:03 PM   Modules accepted: Orders

## 2019-01-05 NOTE — Telephone Encounter (Signed)
This med cannot be done erx  Rx was done to fax yesterday to [facility]

## 2019-01-05 NOTE — Telephone Encounter (Signed)
Pt's wife stated she requested rx for DTP Boostrix to be sent to pt's pharmacy on 12/30/18. The pharmacy has not received anything. She would like to request this to be sent in today or tomorrow so they may both go together to have this done. Pt's wife is a pt of Dr. Jenny Reichmann and hers was sent to pharmacy. Please advise.  [id number] [place] [place] - [address] [phone] (Phone) [phone] (Fax)

## 2019-01-05 NOTE — Telephone Encounter (Signed)
This was not on my desk to fax. Please advise.

## 2019-01-06 NOTE — Telephone Encounter (Signed)
[facility] for the DTP  Boostrix and they would like to go together asap to have the vaccine

## 2019-01-09 MED ORDER — TETANUS-DIPHTH-ACELL PERTUSSIS 5-2.5-18.5 LF-MCG/0.5 IM SUSP: 0.5000 mL | Freq: Once | INTRAMUSCULAR | 0 refills | Status: AC

## 2019-01-09 NOTE — Addendum Note (Signed)
Addended by: Cresenciano Lick on: 01/09/2019 09:34 AM   Modules accepted: Orders

## 2019-01-09 NOTE — Telephone Encounter (Signed)
Rx re-sent. See meds.

## 2019-04-04 DIAGNOSIS — N3020 Other chronic cystitis without hematuria: Secondary | ICD-10-CM | POA: Diagnosis not present

## 2019-04-04 DIAGNOSIS — N318 Other neuromuscular dysfunction of bladder: Secondary | ICD-10-CM | POA: Diagnosis not present

## 2019-04-04 DIAGNOSIS — C672 Malignant neoplasm of lateral wall of bladder: Secondary | ICD-10-CM | POA: Diagnosis not present

## 2019-04-06 ENCOUNTER — Ambulatory Visit (INDEPENDENT_AMBULATORY_CARE_PROVIDER_SITE_OTHER): Payer: Medicare Other | Admitting: Internal Medicine

## 2019-04-06 ENCOUNTER — Other Ambulatory Visit: Payer: Self-pay

## 2019-04-06 ENCOUNTER — Encounter: Payer: Self-pay | Admitting: Internal Medicine

## 2019-04-06 VITALS — BP 136/80 | HR 62 | Temp 99.1°F | Ht 72.0 in | Wt 188.0 lb

## 2019-04-06 DIAGNOSIS — E559 Vitamin D deficiency, unspecified: Secondary | ICD-10-CM

## 2019-04-06 DIAGNOSIS — R509 Fever, unspecified: Secondary | ICD-10-CM

## 2019-04-06 DIAGNOSIS — I10 Essential (primary) hypertension: Secondary | ICD-10-CM | POA: Diagnosis not present

## 2019-04-06 DIAGNOSIS — G629 Polyneuropathy, unspecified: Secondary | ICD-10-CM

## 2019-04-06 DIAGNOSIS — Z0001 Encounter for general adult medical examination with abnormal findings: Secondary | ICD-10-CM

## 2019-04-06 DIAGNOSIS — N401 Enlarged prostate with lower urinary tract symptoms: Secondary | ICD-10-CM | POA: Diagnosis not present

## 2019-04-06 DIAGNOSIS — R739 Hyperglycemia, unspecified: Secondary | ICD-10-CM

## 2019-04-06 DIAGNOSIS — Z0000 Encounter for general adult medical examination without abnormal findings: Secondary | ICD-10-CM | POA: Diagnosis not present

## 2019-04-06 DIAGNOSIS — N138 Other obstructive and reflux uropathy: Secondary | ICD-10-CM

## 2019-04-06 DIAGNOSIS — Z23 Encounter for immunization: Secondary | ICD-10-CM | POA: Diagnosis not present

## 2019-04-06 DIAGNOSIS — E538 Deficiency of other specified B group vitamins: Secondary | ICD-10-CM

## 2019-04-06 DIAGNOSIS — E785 Hyperlipidemia, unspecified: Secondary | ICD-10-CM

## 2019-04-06 HISTORY — DX: Polyneuropathy, unspecified: G62.9

## 2019-04-06 LAB — CBC WITH DIFFERENTIAL/PLATELET
Basophils Absolute: 0.1 10*3/uL (ref 0.0–0.1)
Basophils Relative: 0.9 % (ref 0.0–3.0)
Eosinophils Absolute: 0.1 10*3/uL (ref 0.0–0.7)
Eosinophils Relative: 1.7 % (ref 0.0–5.0)
HCT: 40.1 % (ref 39.0–52.0)
Hemoglobin: 13.1 g/dL (ref 13.0–17.0)
Lymphocytes Relative: 24.7 % (ref 12.0–46.0)
Lymphs Abs: 1.4 10*3/uL (ref 0.7–4.0)
MCHC: 32.5 g/dL (ref 30.0–36.0)
MCV: 88.9 fl (ref 78.0–100.0)
Monocytes Absolute: 0.5 10*3/uL (ref 0.1–1.0)
Monocytes Relative: 8.7 % (ref 3.0–12.0)
Neutro Abs: 3.8 10*3/uL (ref 1.4–7.7)
Neutrophils Relative %: 64 % (ref 43.0–77.0)
Platelets: 197 10*3/uL (ref 150.0–400.0)
RBC: 4.51 Mil/uL (ref 4.22–5.81)
RDW: 15.1 % (ref 11.5–15.5)
WBC: 5.9 10*3/uL (ref 4.0–10.5)

## 2019-04-06 LAB — URINALYSIS, ROUTINE W REFLEX MICROSCOPIC
Bilirubin Urine: NEGATIVE
Hgb urine dipstick: NEGATIVE
Ketones, ur: NEGATIVE
Leukocytes,Ua: NEGATIVE
Nitrite: NEGATIVE
RBC / HPF: NONE SEEN (ref 0–?)
Specific Gravity, Urine: 1.025 (ref 1.000–1.030)
Total Protein, Urine: NEGATIVE
Urine Glucose: NEGATIVE
Urobilinogen, UA: 0.2 (ref 0.0–1.0)
pH: 5.5 (ref 5.0–8.0)

## 2019-04-06 MED ORDER — TAMSULOSIN HCL 0.4 MG PO CAPS: 0.4000 mg | ORAL_CAPSULE | Freq: Every day | ORAL | 3 refills | Status: DC

## 2019-04-06 NOTE — Assessment & Plan Note (Signed)
stable overall by history and exam, recent data reviewed with pt, and pt to continue medical treatment as before,  to f/u any worsening symptoms or concerns  

## 2019-04-06 NOTE — Progress Notes (Addendum)
Subjective:    Patient ID: Stephen Meza, male    DOB: 1933/09/29, 86 y.o.   MRN: [id number]  HPI  Here for wellness and f/u;  Overall doing ok;  Pt denies Chest pain, worsening SOB, DOE, wheezing, orthopnea, PND, worsening LE edema, palpitations, dizziness or syncope.  Pt denies neurological change such as new headache, facial or extremity weakness.  Pt denies polydipsia, polyuria, or low sugar symptoms. Pt states overall good compliance with treatment and medications, good tolerability, and has been trying to follow appropriate diet.  Pt denies worsening depressive symptoms, suicidal ideation or panic. No fever, night sweats, wt loss, loss of appetite, or other constitutional symptoms.  Pt states good ability with ADL's, has low fall risk, home safety reviewed and adequate, no other significant changes in hearing or vision, and only occasionally active with exercise.  S/p covid shot x x 2.   Also with urinary outlet syptoms and frequency during the day, but Denies urinary symptoms such as dysuria, frequency, urgency, flank pain, hematuria or n/v, fever, chills, and not taking his flomax Past Medical History:  Diagnosis Date  . DUODENITIS, WITHOUT HEMORRHAGE 07/14/2007  . ERECTILE DYSFUNCTION 07/14/2007  . Esophageal stricture 03/19/2016  . ESOPHAGITIS 07/14/2007  . EXTERNAL HEMORRHOIDS 07/14/2007  . GERD 07/14/2007  . Gout   . HYPERTENSION 07/14/2007  . PERIPHERAL NEUROPATHY 07/14/2007  . Peripheral neuropathy 04/06/2019  . PERIPHERAL VASCULAR DISEASE 07/14/2007  . PODAGRA 07/14/2007  . Prostate cancer ([place]) 02/2012   pt had screening and then biopsy  . Retinal tear of right eye 03/19/2016  . TOBACCO ABUSE, HX OF 02/12/2010  . TONSILLECTOMY, HX OF 07/14/2007  . TREMOR, ESSENTIAL 07/14/2007  . VITAMIN B12 DEFICIENCY 07/14/2007   Past Surgical History:  Procedure Laterality Date  . Excision of lipoma    . PROSTATE BIOPSY Bilateral 03/10/2012  . surgery to reduce turbinate and straighten  deviated septum  2016  . TONSILLECTOMY      reports that he quit smoking about 25 years ago. He has quit using smokeless tobacco. He reports previous alcohol use of about 30.0 standard drinks of alcohol per week. He reports that he does not use drugs. family history includes Breast cancer in his sister and another family member; Cancer in his mother; Hypothyroidism in his daughter; Other in his father. Allergies  Allergen Reactions  . Nitrofuran Derivatives Rash  . Ciprofloxacin Hcl Other (See Comments)    numbness   Current Outpatient Medications on File Prior to Visit  Medication Sig Dispense Refill  . allopurinol (ZYLOPRIM) 100 MG tablet TAKE 1 TABLET BY MOUTH TWICE A DAY FOR GOUT 180 tablet 4  . allopurinol (ZYLOPRIM) 100 MG tablet Take 1 tablet (100 mg total) by mouth 2 (two) times daily. 180 tablet 4  . amLODipine (NORVASC) 2.5 MG tablet TAKE 1 TABLET (2.5 MG TOTAL) BY MOUTH DAILY. 90 tablet 1  . colchicine 0.6 MG tablet TAKE 2 TABLETS AT THE START OF GOUT FLARE.MAY REPEAT 1 TAB 2 HOURS LATER. LIMIT 3 TABS IN 24 HOURS. 9 tablet 3  . cyanocobalamin (,VITAMIN B-12,) 1000 MCG/ML injection INJECT 1 ML INTO THE MUSCLE EVERY 30 DAYS 3 mL 2  . Cyanocobalamin (B-12) 3000 MCG CAPS Take by mouth daily.    Marland Kitchen docusate sodium (COLACE) 100 MG capsule Take 100 mg by mouth once.    Marland Kitchen Leuprolide Acetate, 6 Month, (LUPRON DEPOT, 49-MONTH, IM) Inject into the muscle every 6 (six) months.    Marland Kitchen omeprazole (PRILOSEC) 40 MG  capsule Take 1 capsule (40 mg total) by mouth daily. 90 capsule 3   No current facility-administered medications on file prior to visit.   Review of Systems All otherwise neg per pt     Objective:   Physical Exam BP 136/80   Pulse 62   Temp 99.1 F (37.3 C)   Ht 6' (1.829 m)   Wt 188 lb (85.3 kg)   SpO2 96%   BMI 25.50 kg/m  VS noted,  Constitutional: Pt appears in NAD HENT: Head: NCAT.  Right Ear: External ear normal.  Left Ear: External ear normal.  Eyes: . Pupils are  equal, round, and reactive to light. Conjunctivae and EOM are normal Nose: without d/c or deformity Neck: Neck supple. Gross normal ROM Cardiovascular: Normal rate and regular rhythm.   Pulmonary/Chest: Effort normal and breath sounds without rales or wheezing.  Abd:  Soft, NT, ND, + BS, no organomegaly Neurological: Pt is alert. At baseline orientation, motor grossly intact Skin: Skin is warm. No rashes, other new lesions, no LE edema Psychiatric: Pt behavior is normal without agitation  All otherwise neg per pt Lab Results  Component Value Date   WBC 5.9 04/06/2019   HGB 13.1 04/06/2019   HCT 40.1 04/06/2019   PLT 197.0 04/06/2019   GLUCOSE 102 (H) 04/04/2018   CHOL 186 04/04/2018   TRIG 117.0 04/04/2018   HDL 56.40 04/04/2018   LDLDIRECT 93.0 03/15/2014   LDLCALC 106 (H) 04/04/2018   ALT 11 04/04/2018   AST 20 04/04/2018   NA 139 04/04/2018   K 4.7 04/04/2018   CL 102 04/04/2018   CREATININE 0.85 04/04/2018   BUN 10 04/04/2018   CO2 30 04/04/2018   TSH 3.06 04/04/2018   PSA 3.54 02/12/2010   HGBA1C 5.4 04/04/2018      Assessment & Plan:

## 2019-04-06 NOTE — Addendum Note (Signed)
Addended by: Biagio Borg on: 04/06/2019 09:40 PM   Modules accepted: Orders

## 2019-04-06 NOTE — Assessment & Plan Note (Addendum)
For flomax asd  I spent 25 minutes preparing to see the patient by review of recent labs, imaging and procedures, obtaining and reviewing separately obtained history, communicating with the patient and family or caregiver, ordering medications, tests or procedures, and documenting clinical information in the EHR including the differential Dx, treatment, and any further evaluation and other management of BPH, HLD, hyperglycemia, HTN, low grade fever

## 2019-04-06 NOTE — Assessment & Plan Note (Signed)
Exam benign, also for urine cx

## 2019-04-06 NOTE — Patient Instructions (Signed)
Please take all new medication as prescribed - the flomax for prostate  Please continue all other medications as before, and refills have been done if requested.  Please have the pharmacy call with any other refills you may need.  Please continue your efforts at being more active, low cholesterol diet, and weight control.  You are otherwise up to date with prevention measures today.  Please keep your appointments with your specialists as you may have planned  Please go to the LAB at the blood drawing area for the tests to be done  You will be contacted by phone if any changes need to be made immediately.  Otherwise, you will receive a letter about your results with an explanation, but please check with MyChart first.  Please remember to sign up for MyChart if you have not done so, as this will be important to you in the future with finding out test results, communicating by private email, and scheduling acute appointments online when needed.  Please make an Appointment to return for your 1 year visit, or sooner if needed, with Lab testing by Appointment as well, to be done about 3-5 days before at the [facility] (so this is for TWO appointments - please see the scheduling desk as you leave)

## 2019-04-06 NOTE — Assessment & Plan Note (Signed)

## 2019-04-07 LAB — BASIC METABOLIC PANEL
BUN: 15 mg/dL (ref 6–23)
CO2: 27 mEq/L (ref 19–32)
Calcium: 9.5 mg/dL (ref 8.4–10.5)
Chloride: 103 mEq/L (ref 96–112)
Creatinine, Ser: 0.91 mg/dL (ref 0.40–1.50)
GFR: 79.41 mL/min (ref >60.00)
Glucose, Bld: 92 mg/dL (ref 70–99)
Potassium: 5.3 mEq/L — ABNORMAL HIGH (ref 3.5–5.1)
Sodium: 139 mEq/L (ref 135–145)

## 2019-04-07 LAB — HEPATIC FUNCTION PANEL
ALT: 10 U/L (ref 0–53)
AST: 16 U/L (ref 0–37)
Albumin: 4.2 g/dL (ref 3.5–5.2)
Alkaline Phosphatase: 98 U/L (ref 39–117)
Bilirubin, Direct: 0.1 mg/dL (ref 0.0–0.3)
Total Bilirubin: 0.5 mg/dL (ref 0.2–1.2)
Total Protein: 7 g/dL (ref 6.0–8.3)

## 2019-04-07 LAB — VITAMIN B12: Vitamin B-12: 798 pg/mL (ref 211–911)

## 2019-04-07 LAB — LIPID PANEL
Cholesterol: 216 mg/dL — ABNORMAL HIGH (ref 0–200)
HDL: 52.2 mg/dL (ref >39.00)
LDL Cholesterol: 145 mg/dL — ABNORMAL HIGH (ref 0–99)
NonHDL: 164.28
Total CHOL/HDL Ratio: 4
Triglycerides: 96 mg/dL (ref 0.0–149.0)
VLDL: 19.2 mg/dL (ref 0.0–40.0)

## 2019-04-07 LAB — URINE CULTURE: Result:: NO GROWTH

## 2019-04-07 LAB — VITAMIN D 25 HYDROXY (VIT D DEFICIENCY, FRACTURES): VITD: 20.78 ng/mL — ABNORMAL LOW (ref 30.00–100.00)

## 2019-04-07 LAB — TSH: TSH: 3.47 u[IU]/mL (ref 0.35–4.50)

## 2019-04-09 ENCOUNTER — Other Ambulatory Visit: Payer: Self-pay | Admitting: Internal Medicine

## 2019-04-09 MED ORDER — ATORVASTATIN CALCIUM 10 MG PO TABS: 10.0000 mg | ORAL_TABLET | Freq: Every day | ORAL | 3 refills | Status: DC

## 2019-04-09 MED ORDER — VITAMIN D (ERGOCALCIFEROL) 1.25 MG (50000 UNIT) PO CAPS: 50000.0000 [IU] | ORAL_CAPSULE | ORAL | 0 refills | Status: DC

## 2019-04-18 DIAGNOSIS — L820 Inflamed seborrheic keratosis: Secondary | ICD-10-CM | POA: Diagnosis not present

## 2019-04-18 DIAGNOSIS — L570 Actinic keratosis: Secondary | ICD-10-CM | POA: Diagnosis not present

## 2019-04-18 DIAGNOSIS — L821 Other seborrheic keratosis: Secondary | ICD-10-CM | POA: Diagnosis not present

## 2019-04-18 DIAGNOSIS — Z85828 Personal history of other malignant neoplasm of skin: Secondary | ICD-10-CM | POA: Diagnosis not present

## 2019-04-18 DIAGNOSIS — L853 Xerosis cutis: Secondary | ICD-10-CM | POA: Diagnosis not present

## 2019-05-04 ENCOUNTER — Telehealth: Payer: Self-pay

## 2019-05-04 DIAGNOSIS — N400 Enlarged prostate without lower urinary tract symptoms: Secondary | ICD-10-CM

## 2019-05-04 NOTE — Telephone Encounter (Signed)
Should be fine for [facility] to [facility], thanks

## 2019-05-04 NOTE — Telephone Encounter (Signed)
New message   The wife calling asking for a referral to a urology wants Dr. Jenny Reichmann opinion.   [place] Dr. Kathleen Argue, [place] is retiring  an has given the patient two referrals   [facility]   [facility]   Please advise.

## 2019-05-05 NOTE — Telephone Encounter (Signed)
Notified pt wife MD has placed referral../lmb

## 2019-05-22 NOTE — Telephone Encounter (Signed)
Original message wanted an advice only, which I gave  Now, apparently the urology referaral is needed from me, which was not the case in the first message  [facility] for referral

## 2019-05-22 NOTE — Addendum Note (Signed)
Addended by: Biagio Borg on: 05/22/2019 09:21 PM   Modules accepted: Orders

## 2019-05-22 NOTE — Telephone Encounter (Signed)
Patient's wife calling and states that she has not received a call to schedule with Urology. Did not see a referral in chart and checked with [facility] Rubin and she did not see one on their end either. Please place referral and give patient a call to let them know this has been done so they can reach out to office and schedule.

## 2019-05-22 NOTE — Telephone Encounter (Signed)
Please see below messages

## 2019-05-23 NOTE — Telephone Encounter (Signed)
Notified pt wife referral has been placed.Marland KitchenJohny Meza

## 2019-06-07 ENCOUNTER — Other Ambulatory Visit: Payer: Self-pay | Admitting: Internal Medicine

## 2019-06-21 ENCOUNTER — Ambulatory Visit (INDEPENDENT_AMBULATORY_CARE_PROVIDER_SITE_OTHER): Payer: Medicare Other

## 2019-06-21 ENCOUNTER — Other Ambulatory Visit: Payer: Self-pay

## 2019-06-21 VITALS — Ht 72.0 in | Wt 183.4 lb

## 2019-06-21 DIAGNOSIS — Z0000 Encounter for general adult medical examination without abnormal findings: Secondary | ICD-10-CM | POA: Diagnosis not present

## 2019-06-21 NOTE — Patient Instructions (Addendum)
[name] , Thank you for taking time to come for your Medicare Wellness Visit. I appreciate your ongoing commitment to your health goals. Please review the following plan we discussed and let me know if I can assist you in the future.   Screening recommendations/referrals: Colorectal Screening: due to age not recommended  Vision and Dental Exams: Recommended annual ophthalmology exams for early detection of glaucoma and other disorders of the eye Recommended annual dental exams for proper oral hygiene  Vaccinations: Influenza vaccine: 10/31/2018 Pneumococcal vaccine: completed Tdap vaccine: 04/06/2019 Shingles vaccine: completed Covid vaccine: completed  Advanced directives: Advance directives discussed with you today. Please bring a copy of your POA (Power of [place]) and/or Living Will to your next appointment.  Goals:  Recommend to drink at least 6-8 8oz glasses of water per day.  Recommend to exercise for at least 150 minutes per week.  Recommend to remove any items from the home that may cause slips or trips.  Recommend to decrease portion sizes by eating 3 small healthy meals and at least 2 healthy snacks per day.  Recommend to begin DASH diet as directed below.  Recommend to continue efforts to reduce smoking habits until no longer smoking. Smoking Cessation literature is attached below.  Next appointment: Please schedule your Annual Wellness Visit with your Nurse Health Advisor in one year.  Preventive Care 84 Years and Older, Male Preventive care refers to lifestyle choices and visits with your health care provider that can promote health and wellness. What does preventive care include?  A yearly physical exam. This is also called an annual well check.  Dental exams once or twice a year.  Routine eye exams. Ask your health care provider how often you should have your eyes checked.  Personal lifestyle choices, including:  Daily care of your teeth and  gums.  Regular physical activity.  Eating a healthy diet.  Avoiding tobacco and drug use.  Limiting alcohol use.  Practicing safe sex.  Taking low doses of aspirin every day if recommended by your health care provider..  Taking vitamin and mineral supplements as recommended by your health care provider. What happens during an annual well check? The services and screenings done by your health care provider during your annual well check will depend on your age, overall health, lifestyle risk factors, and family history of disease. Counseling  Your health care provider may ask you questions about your:  Alcohol use.  Tobacco use.  Drug use.  Emotional well-being.  Home and relationship well-being.  Sexual activity.  Eating habits.  History of falls.  Memory and ability to understand (cognition).  Work and work Statistician. Screening  You may have the following tests or measurements:  Height, weight, and BMI.  Blood pressure.  Lipid and cholesterol levels. These may be checked every 5 years, or more frequently if you are over 74 years old.  Skin check.  Lung cancer screening. You may have this screening every year starting at age 92 if you have a 30-pack-year history of smoking and currently smoke or have quit within the past 15 years.  Fecal occult blood test (FOBT) of the stool. You may have this test every year starting at age 42.  Flexible sigmoidoscopy or colonoscopy. You may have a sigmoidoscopy every 5 years or a colonoscopy every 10 years starting at age 9.  Prostate cancer screening. Recommendations will vary depending on your family history and other risks.  Hepatitis C blood test.  Hepatitis B blood test.  Sexually transmitted disease (STD) testing.  Diabetes screening. This is done by checking your blood sugar (glucose) after you have not eaten for a while (fasting). You may have this done every 1-3 years.  Abdominal aortic aneurysm (AAA)  screening. You may need this if you are a current or former smoker.  Osteoporosis. You may be screened starting at age 80 if you are at high risk. Talk with your health care provider about your test results, treatment options, and if necessary, the need for more tests. Vaccines  Your health care provider may recommend certain vaccines, such as:  Influenza vaccine. This is recommended every year.  Tetanus, diphtheria, and acellular pertussis (Tdap, Td) vaccine. You may need a Td booster every 10 years.  Zoster vaccine. You may need this after age 28.  Pneumococcal 13-valent conjugate (PCV13) vaccine. One dose is recommended after age 62.  Pneumococcal polysaccharide (PPSV23) vaccine. One dose is recommended after age 72. Talk to your health care provider about which screenings and vaccines you need and how often you need them. This information is not intended to replace advice given to you by your health care provider. Make sure you discuss any questions you have with your health care provider. Document Released: 03/08/2015 Document Revised: 10/30/2015 Document Reviewed: 12/11/2014 Elsevier Interactive Patient Education  2017 [place] Prevention in the Home Falls can cause injuries. They can happen to people of all ages. There are many things you can do to make your home safe and to help prevent falls. What can I do on the outside of my home?  Regularly fix the edges of walkways and driveways and fix any cracks.  Remove anything that might make you trip as you walk through a door, such as a raised step or threshold.  Trim any bushes or trees on the path to your home.  Use bright outdoor lighting.  Clear any walking paths of anything that might make someone trip, such as rocks or tools.  Regularly check to see if handrails are loose or broken. Make sure that both sides of any steps have handrails.  Any raised decks and porches should have guardrails on the  edges.  Have any leaves, snow, or ice cleared regularly.  Use sand or salt on walking paths during winter.  Clean up any spills in your garage right away. This includes oil or grease spills. What can I do in the bathroom?  Use night lights.  Install grab bars by the toilet and in the tub and shower. Do not use towel bars as grab bars.  Use non-skid mats or decals in the tub or shower.  If you need to sit down in the shower, use a plastic, non-slip stool.  Keep the floor dry. Clean up any water that spills on the floor as soon as it happens.  Remove soap buildup in the tub or shower regularly.  Attach bath mats securely with double-sided non-slip rug tape.  Do not have throw rugs and other things on the floor that can make you trip. What can I do in the bedroom?  Use night lights.  Make sure that you have a light by your bed that is easy to reach.  Do not use any sheets or blankets that are too big for your bed. They should not hang down onto the floor.  Have a firm chair that has side arms. You can use this for support while you get dressed.  Do not have throw rugs and other  things on the floor that can make you trip. What can I do in the kitchen?  Clean up any spills right away.  Avoid walking on wet floors.  Keep items that you use a lot in easy-to-reach places.  If you need to reach something above you, use a strong step stool that has a grab bar.  Keep electrical cords out of the way.  Do not use floor polish or wax that makes floors slippery. If you must use wax, use non-skid floor wax.  Do not have throw rugs and other things on the floor that can make you trip. What can I do with my stairs?  Do not leave any items on the stairs.  Make sure that there are handrails on both sides of the stairs and use them. Fix handrails that are broken or loose. Make sure that handrails are as long as the stairways.  Check any carpeting to make sure that it is firmly  attached to the stairs. Fix any carpet that is loose or worn.  Avoid having throw rugs at the top or bottom of the stairs. If you do have throw rugs, attach them to the floor with carpet tape.  Make sure that you have a light switch at the top of the stairs and the bottom of the stairs. If you do not have them, ask someone to add them for you. What else can I do to help prevent falls?  Wear shoes that:  Do not have high heels.  Have rubber bottoms.  Are comfortable and fit you well.  Are closed at the toe. Do not wear sandals.  If you use a stepladder:  Make sure that it is fully opened. Do not climb a closed stepladder.  Make sure that both sides of the stepladder are locked into place.  Ask someone to hold it for you, if possible.  Clearly mark and make sure that you can see:  Any grab bars or handrails.  First and last steps.  Where the edge of each step is.  Use tools that help you move around (mobility aids) if they are needed. These include:  Canes.  Walkers.  Scooters.  Crutches.  Turn on the lights when you go into a dark area. Replace any light bulbs as soon as they burn out.  Set up your furniture so you have a clear path. Avoid moving your furniture around.  If any of your floors are uneven, fix them.  If there are any pets around you, be aware of where they are.  Review your medicines with your doctor. Some medicines can make you feel dizzy. This can increase your chance of falling. Ask your doctor what other things that you can do to help prevent falls. This information is not intended to replace advice given to you by your health care provider. Make sure you discuss any questions you have with your health care provider. Document Released: 12/06/2008 Document Revised: 07/18/2015 Document Reviewed: 03/16/2014 Elsevier Interactive Patient Education  2017 [employer].

## 2019-06-21 NOTE — Progress Notes (Signed)
Subjective:   Stephen Meza is a 86 y.o. male who presents for Medicare Annual/Subsequent preventive examination.  Review of Systems:  No ROS Medicare Wellness Visit Cardiac Risk Factors include: advanced age (>74men, >76 women);dyslipidemia;hypertension;male gender Sleep Patterns: No issues with falling sleep; gets up 1-2 times to void; feels rested after 8 hours of sleep nightly. Home Safety/Smoke Alarms: Feels safe in home; Smoke alarms in place. Living environment: 2-story home (lives on the first floor); lives with his wife; has two daughters and grandchildren; no need for DME at this time; good support system. Seat Belt Safety/Bike Helmet: Wears seat belt.     Objective:    Vitals: Ht 6' (1.829 m)   Wt 183 lb 6.4 oz (83.2 kg)   BMI 24.87 kg/m   Body mass index is 24.87 kg/m.  Advanced Directives 06/21/2019  Does Patient Have a Medical Advance Directive? Yes  Type of Paramedic of [place];Living will  Does patient want to make changes to medical advance directive? No - Patient declined  Copy of [place] in Chart? No - copy requested    Tobacco Social History   Tobacco Use  Smoking Status Former Smoker  . Quit date: 02/23/1994  . Years since quitting: 25.3  Smokeless Tobacco Former Engineer, structural given: No   Clinical Intake:  Pre-visit preparation completed: Yes  Pain : 0-10 Pain Score: 2  Pain Type: Chronic pain Pain Location: Leg Pain Orientation: Right, Left(also has upper neck pain) Pain Descriptors / Indicators: Aching, Discomfort Pain Onset: More than a month ago Pain Frequency: Intermittent     BMI - recorded: 24.9 Nutritional Status: BMI of 19-24  Normal Nutritional Risks: None Diabetes: No  How often do you need to have someone help you when you read instructions, pamphlets, or other written materials from your doctor or pharmacy?: 1 - Never  Interpreter Needed?: No  Information  entered by :: Stephen Marcos N. Lowell Guitar, LPN  Past Medical History:  Diagnosis Date  . DUODENITIS, WITHOUT HEMORRHAGE 07/14/2007  . ERECTILE DYSFUNCTION 07/14/2007  . Esophageal stricture 03/19/2016  . ESOPHAGITIS 07/14/2007  . EXTERNAL HEMORRHOIDS 07/14/2007  . GERD 07/14/2007  . Gout   . HYPERTENSION 07/14/2007  . PERIPHERAL NEUROPATHY 07/14/2007  . Peripheral neuropathy 04/06/2019  . PERIPHERAL VASCULAR DISEASE 07/14/2007  . PODAGRA 07/14/2007  . Prostate cancer ([place]) 02/2012   pt had screening and then biopsy  . Retinal tear of right eye 03/19/2016  . TOBACCO ABUSE, HX OF 02/12/2010  . TONSILLECTOMY, HX OF 07/14/2007  . TREMOR, ESSENTIAL 07/14/2007  . VITAMIN B12 DEFICIENCY 07/14/2007   Past Surgical History:  Procedure Laterality Date  . Excision of lipoma    . PROSTATE BIOPSY Bilateral 03/10/2012  . surgery to reduce turbinate and straighten deviated septum  2016  . TONSILLECTOMY     Family History  Problem Relation Age of Onset  . Hypothyroidism Daughter   . Breast cancer Other   . Cancer Mother   . Other Father   . Breast cancer Sister    Social History   Socioeconomic History  . Marital status: Married    Spouse name: Not on file  . Number of children: 2  . Years of education: 75  . Highest education level: Not on file  Occupational History  . Occupation: retired    Fish farm manager: RETIRED  Tobacco Use  . Smoking status: Former Smoker    Quit date: 02/23/1994    Years since  quitting: 25.3  . Smokeless tobacco: Former Network engineer and Sexual Activity  . Alcohol use: Not Currently    Alcohol/week: 30.0 standard drinks    Types: 30 Cans of beer per week    Comment: d/c beer about 5 months ago  . Drug use: No  . Sexual activity: Yes    Partners: Female  Other Topics Concern  . Not on file  Social History Narrative   HSG. Married '1958. 2 dtrs; 4 g-children. Work - retired. 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       Not disabled      Retired since 1986      Right handed      Two story home   Social Determinants of Health   Financial Resource Strain:   . Difficulty of Paying Living Expenses:   Food Insecurity:   . Worried About Charity fundraiser in the Last Year:   . Arboriculturist in the Last Year:   Transportation Needs:   . Film/video editor (Medical):   Marland Kitchen Lack of Transportation (Non-Medical):   Physical Activity:   . Days of Exercise per Week:   . Minutes of Exercise per Session:   Stress:   . Feeling of Stress :   Social Connections:   . Frequency of Communication with Friends and Family:   . Frequency of Social Gatherings with Friends and Family:   . Attends Religious Services:   . Active Member of Clubs or Organizations:   . Attends Archivist Meetings:   Marland Kitchen Marital Status:     Outpatient Encounter Medications as of 06/21/2019  Medication Sig  . allopurinol (ZYLOPRIM) 100 MG tablet TAKE 1 TABLET BY MOUTH TWICE A DAY FOR GOUT  . allopurinol (ZYLOPRIM) 100 MG tablet Take 1 tablet (100 mg total) by mouth 2 (two) times daily.  Marland Kitchen amLODipine (NORVASC) 2.5 MG tablet TAKE 1 TABLET (2.5 MG TOTAL) BY MOUTH DAILY.  Marland Kitchen atorvastatin (LIPITOR) 10 MG tablet Take 1 tablet (10 mg total) by mouth daily.  . colchicine 0.6 MG tablet TAKE 2 TABLETS AT THE START OF GOUT FLARE.MAY REPEAT 1 TAB 2 HOURS LATER. LIMIT 3 TABS IN 24 HOURS.  . cyanocobalamin (,VITAMIN B-12,) 1000 MCG/ML injection INJECT 1 ML INTO THE MUSCLE EVERY 30 DAYS  . Cyanocobalamin (B-12) 3000 MCG CAPS Take by mouth daily.  Marland Kitchen docusate sodium (COLACE) 100 MG capsule Take 100 mg by mouth once.  Marland Kitchen Leuprolide Acetate, 6 Month, (LUPRON DEPOT, 56-MONTH, IM) Inject into the muscle every 6 (six) months.  Marland Kitchen omeprazole (PRILOSEC) 40 MG capsule TAKE 1 CAPSULE BY MOUTH EVERY DAY  . tamsulosin (FLOMAX) 0.4 MG CAPS capsule Take 1 capsule (0.4 mg total) by mouth daily after supper.  . Vitamin D, Ergocalciferol,  (DRISDOL) 1.25 MG (50000 UNIT) CAPS capsule Take 1 capsule (50,000 Units total) by mouth every 7 (seven) days.   No facility-administered encounter medications on file as of 06/21/2019.    Activities of Daily Living In your present state of health, do you have any difficulty performing the following activities: 06/21/2019  Hearing? N  Vision? N  Difficulty concentrating or making decisions? N  Walking or climbing stairs? N  Dressing or bathing? N  Doing errands, shopping? N  Preparing Food and eating ? N  Using the Toilet? N  In the past six months, have you accidently leaked urine? Y  Do  you have problems with loss of bowel control? N  Managing your Medications? N  Managing your Finances? N  Housekeeping or managing your Housekeeping? N  Some recent data might be hidden    Patient Care Team: Biagio Borg, MD as PCP - General (Internal Medicine)   Assessment:   This is a routine wellness examination for [place].  Exercise Activities and Dietary recommendations Current Exercise Habits: The patient does not participate in regular exercise at present(active in the yard and gardening), Exercise limited by: orthopedic condition(s);neurologic condition(s)  Goals    . Client understands the importance of follow-up with providers by attending scheduled visits    . 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       Fall Risk Fall Risk  06/21/2019 04/06/2019 08/01/2018 04/04/2018 03/23/2017  Falls in the past year? 1 0 1 1 No  Number falls in past yr: 0 - 0 1 -  Comment - - - tripped going to the BR -  Injury with Fall? 1 - 0 0 -  Comment scraped back of arm and elbow; fell in the garden - - - -  Risk Factor Category  - - - - -  Comment - - - - -  Risk for fall due to : Other (Comment) - Impaired balance/gait - -  Risk for fall due to: Comment accidently tripped over some weeds in the garden - - - -  Follow up Falls evaluation completed;Education  provided;Falls prevention discussed - Falls evaluation completed - -   Is the patient's home free of loose throw rugs in walkways, pet beds, electrical cords, etc?   yes      Grab bars in the bathroom? yes      Handrails on the stairs?   yes      Adequate lighting?   yes  Depression Screen PHQ 2/9 Scores 06/21/2019 04/06/2019 04/04/2018 03/23/2017  PHQ - 2 Score 0 0 0 0  PHQ- 9 Score - - - 0    Cognitive Function   Montreal Cognitive Assessment  08/01/2018  Visuospatial/ Executive (0/5) 3  Naming (0/3) 2  Attention: Read list of digits (0/2) 2  Attention: Read list of letters (0/1) 1  Attention: Serial 7 subtraction starting at 100 (0/3) 3  Language: Repeat phrase (0/2) 1  Language : Fluency (0/1) 0  Abstraction (0/2) 0  Delayed Recall (0/5) 0  Orientation (0/6) 5  Total 17  Adjusted Score (based on education) 18      Immunization History  Administered Date(s) Administered  . Influenza Whole 12/23/2006, 12/17/2008, 11/27/2011  . Influenza, High Dose Seasonal PF 10/31/2018  . Influenza-Unspecified 12/08/2014, 11/23/2016  . Pneumococcal Conjugate-13 05/01/2013  . Pneumococcal Polysaccharide-23 12/23/2006  . Td 10/23/2008  . Tdap 04/06/2019  . Zoster 12/23/2006  . Zoster Recombinat (Shingrix) 08/03/2017, 10/12/2017    Qualifies for Shingles Vaccine? completed  Screening Tests Health Maintenance  Topic Date Due  . COVID-19 Vaccine (1) Never done  . INFLUENZA VACCINE  09/24/2019  . TETANUS/TDAP  04/05/2029  . PNA vac Low Risk Adult  Completed   Cancer Screenings: Lung: Low Dose CT Chest recommended if Age 37-80 years, 30 pack-year currently smoking OR have quit w/in 15years. Patient does not qualify. Colorectal: not recommended due to age    Plan:     Reviewed health maintenance screenings with patient today and relevant education, vaccines, and/or referrals were provided.    Continue doing brain  stimulating activities (puzzles, reading, adult coloring books,  staying active) to keep memory sharp.    Continue to eat heart healthy diet (full of fruits, vegetables, whole grains, lean protein, water--limit salt, fat, and sugar intake) and increase physical activity as tolerated.  I have personally reviewed and noted the following in the patient's chart:   . Medical and social history . Use of alcohol, tobacco or illicit drugs  . Current medications and supplements . Functional ability and status . Nutritional status . Physical activity . Advanced directives . List of other physicians . Hospitalizations, surgeries, and ER visits in previous 12 months . Vitals . Screenings to include cognitive, depression, and falls . Referrals and appointments  In addition, I have reviewed and discussed with patient certain preventive protocols, quality metrics, and best practice recommendations. A written personalized care plan for preventive services as well as general preventive health recommendations were provided to patient.     Sheral Flow, LPN  [phone]  Nurse Health Advisor  Nurse Notes: Only height and weight taken during visit.

## 2019-06-22 ENCOUNTER — Encounter: Payer: Self-pay | Admitting: Internal Medicine

## 2019-06-25 ENCOUNTER — Other Ambulatory Visit: Payer: Self-pay | Admitting: Internal Medicine

## 2019-06-25 NOTE — Telephone Encounter (Signed)
Please change to OTC Vitamin D3 at 2000 units per day, indefinitely.  

## 2019-06-27 DIAGNOSIS — C678 Malignant neoplasm of overlapping sites of bladder: Secondary | ICD-10-CM | POA: Diagnosis not present

## 2019-06-27 DIAGNOSIS — R350 Frequency of micturition: Secondary | ICD-10-CM | POA: Diagnosis not present

## 2019-06-29 ENCOUNTER — Other Ambulatory Visit: Payer: Self-pay | Admitting: Internal Medicine

## 2019-06-29 NOTE — Telephone Encounter (Signed)
Please refill as per office routine med refill policy (all routine meds refilled for 3 mo or monthly per pt preference up to one year from last visit, then month to month grace period for 3 mo, then further med refills will have to be denied)  Please change to OTC Vitamin D3 at 2000 units per day, indefinitely.

## 2019-07-04 ENCOUNTER — Other Ambulatory Visit: Payer: Self-pay | Admitting: Internal Medicine

## 2019-07-04 NOTE — Telephone Encounter (Signed)
Please change to OTC Vitamin D3 at 2000 units per day, indefinitely.  

## 2019-07-09 ENCOUNTER — Other Ambulatory Visit: Payer: Self-pay | Admitting: Internal Medicine

## 2019-07-09 NOTE — Telephone Encounter (Signed)
Please change to OTC Vitamin D3 at 2000 units per day, indefinitely.  

## 2019-07-25 ENCOUNTER — Telehealth: Payer: Self-pay

## 2019-07-25 NOTE — Telephone Encounter (Signed)
New message    Please advise if the patient should continue taken Vitamin D, Ergocalciferol, (DRISDOL) 1.25 MG (50000 UNIT) CAPS capsule the pharmacy has been trying to get the medication refill.    [id number] - [place], [place] - [address]

## 2019-07-25 NOTE — Telephone Encounter (Signed)
Message left for patient to switch to OTC Vitamin D3 2000 IU daily

## 2019-08-14 DIAGNOSIS — T1502XA Foreign body in cornea, left eye, initial encounter: Secondary | ICD-10-CM | POA: Diagnosis not present

## 2019-08-14 DIAGNOSIS — H353131 Nonexudative age-related macular degeneration, bilateral, early dry stage: Secondary | ICD-10-CM | POA: Diagnosis not present

## 2019-09-25 DIAGNOSIS — C678 Malignant neoplasm of overlapping sites of bladder: Secondary | ICD-10-CM | POA: Diagnosis not present

## 2019-09-25 DIAGNOSIS — C679 Malignant neoplasm of bladder, unspecified: Secondary | ICD-10-CM | POA: Diagnosis not present

## 2019-09-28 DIAGNOSIS — C678 Malignant neoplasm of overlapping sites of bladder: Secondary | ICD-10-CM | POA: Diagnosis not present

## 2019-11-01 ENCOUNTER — Other Ambulatory Visit: Payer: Self-pay | Admitting: Ophthalmology

## 2019-11-01 DIAGNOSIS — N3030 Trigonitis without hematuria: Secondary | ICD-10-CM | POA: Diagnosis not present

## 2019-11-01 DIAGNOSIS — N3010 Interstitial cystitis (chronic) without hematuria: Secondary | ICD-10-CM | POA: Diagnosis not present

## 2019-11-01 DIAGNOSIS — N3289 Other specified disorders of bladder: Secondary | ICD-10-CM | POA: Diagnosis not present

## 2019-11-09 DIAGNOSIS — R350 Frequency of micturition: Secondary | ICD-10-CM | POA: Diagnosis not present

## 2019-11-09 DIAGNOSIS — C678 Malignant neoplasm of overlapping sites of bladder: Secondary | ICD-10-CM | POA: Diagnosis not present

## 2019-11-27 ENCOUNTER — Ambulatory Visit (INDEPENDENT_AMBULATORY_CARE_PROVIDER_SITE_OTHER): Payer: Medicare Other

## 2019-11-27 ENCOUNTER — Other Ambulatory Visit: Payer: Self-pay

## 2019-11-27 DIAGNOSIS — Z23 Encounter for immunization: Secondary | ICD-10-CM

## 2019-11-27 NOTE — Progress Notes (Signed)
Pt given flu vacc w/o complications.

## 2019-12-12 DIAGNOSIS — H02884 Meibomian gland dysfunction left upper eyelid: Secondary | ICD-10-CM | POA: Diagnosis not present

## 2019-12-12 DIAGNOSIS — H43392 Other vitreous opacities, left eye: Secondary | ICD-10-CM | POA: Diagnosis not present

## 2019-12-26 ENCOUNTER — Other Ambulatory Visit: Payer: Self-pay | Admitting: Internal Medicine

## 2019-12-26 NOTE — Telephone Encounter (Signed)
Please refill as per office routine med refill policy (all routine meds refilled for 3 mo or monthly per pt preference up to one year from last visit, then month to month grace period for 3 mo, then further med refills will have to be denied)  

## 2020-03-13 DIAGNOSIS — C678 Malignant neoplasm of overlapping sites of bladder: Secondary | ICD-10-CM | POA: Diagnosis not present

## 2020-03-20 ENCOUNTER — Other Ambulatory Visit: Payer: Self-pay | Admitting: Internal Medicine

## 2020-03-20 NOTE — Telephone Encounter (Signed)
Please refill as per office routine med refill policy (all routine meds refilled for 3 mo or monthly per pt preference up to one year from last visit, then month to month grace period for 3 mo, then further med refills will have to be denied)  

## 2020-04-03 ENCOUNTER — Other Ambulatory Visit: Payer: Medicare Other

## 2020-04-05 ENCOUNTER — Other Ambulatory Visit: Payer: Self-pay

## 2020-04-08 ENCOUNTER — Ambulatory Visit (INDEPENDENT_AMBULATORY_CARE_PROVIDER_SITE_OTHER): Payer: Medicare Other | Admitting: Internal Medicine

## 2020-04-08 ENCOUNTER — Other Ambulatory Visit: Payer: Self-pay

## 2020-04-08 ENCOUNTER — Encounter: Payer: Self-pay | Admitting: Internal Medicine

## 2020-04-08 VITALS — BP 152/85 | HR 82 | Temp 98.5°F | Ht 72.0 in | Wt 183.0 lb

## 2020-04-08 DIAGNOSIS — I499 Cardiac arrhythmia, unspecified: Secondary | ICD-10-CM

## 2020-04-08 DIAGNOSIS — Z0001 Encounter for general adult medical examination with abnormal findings: Secondary | ICD-10-CM | POA: Diagnosis not present

## 2020-04-08 DIAGNOSIS — E785 Hyperlipidemia, unspecified: Secondary | ICD-10-CM | POA: Diagnosis not present

## 2020-04-08 DIAGNOSIS — N401 Enlarged prostate with lower urinary tract symptoms: Secondary | ICD-10-CM

## 2020-04-08 DIAGNOSIS — I10 Essential (primary) hypertension: Secondary | ICD-10-CM | POA: Diagnosis not present

## 2020-04-08 DIAGNOSIS — N138 Other obstructive and reflux uropathy: Secondary | ICD-10-CM

## 2020-04-08 DIAGNOSIS — E538 Deficiency of other specified B group vitamins: Secondary | ICD-10-CM

## 2020-04-08 LAB — TSH: TSH: 3.62 u[IU]/mL (ref 0.35–4.50)

## 2020-04-08 LAB — HEPATIC FUNCTION PANEL
ALT: 11 U/L (ref 0–53)
AST: 16 U/L (ref 0–37)
Albumin: 4.4 g/dL (ref 3.5–5.2)
Alkaline Phosphatase: 90 U/L (ref 39–117)
Bilirubin, Direct: 0.1 mg/dL (ref 0.0–0.3)
Total Bilirubin: 0.6 mg/dL (ref 0.2–1.2)
Total Protein: 7.8 g/dL (ref 6.0–8.3)

## 2020-04-08 LAB — LIPID PANEL
Cholesterol: 199 mg/dL (ref 0–200)
HDL: 49.5 mg/dL (ref >39.00)
LDL Cholesterol: 125 mg/dL — ABNORMAL HIGH (ref 0–99)
NonHDL: 149.32
Total CHOL/HDL Ratio: 4
Triglycerides: 123 mg/dL (ref 0.0–149.0)
VLDL: 24.6 mg/dL (ref 0.0–40.0)

## 2020-04-08 LAB — CBC WITH DIFFERENTIAL/PLATELET
Basophils Absolute: 0 K/uL (ref 0.0–0.1)
Basophils Relative: 0.5 % (ref 0.0–3.0)
Eosinophils Absolute: 0.1 K/uL (ref 0.0–0.7)
Eosinophils Relative: 1.1 % (ref 0.0–5.0)
HCT: 38.5 % — ABNORMAL LOW (ref 39.0–52.0)
Hemoglobin: 12.9 g/dL — ABNORMAL LOW (ref 13.0–17.0)
Lymphocytes Relative: 23.9 % (ref 12.0–46.0)
Lymphs Abs: 1.5 K/uL (ref 0.7–4.0)
MCHC: 33.7 g/dL (ref 30.0–36.0)
MCV: 87.3 fl (ref 78.0–100.0)
Monocytes Absolute: 0.5 K/uL (ref 0.1–1.0)
Monocytes Relative: 8.7 % (ref 3.0–12.0)
Neutro Abs: 4 K/uL (ref 1.4–7.7)
Neutrophils Relative %: 65.8 % (ref 43.0–77.0)
Platelets: 202 K/uL (ref 150.0–400.0)
RBC: 4.41 Mil/uL (ref 4.22–5.81)
RDW: 14.9 % (ref 11.5–15.5)
WBC: 6.1 K/uL (ref 4.0–10.5)

## 2020-04-08 LAB — BASIC METABOLIC PANEL
BUN: 13 mg/dL (ref 6–23)
CO2: 29 mEq/L (ref 19–32)
Calcium: 9.8 mg/dL (ref 8.4–10.5)
Chloride: 102 mEq/L (ref 96–112)
Creatinine, Ser: 0.95 mg/dL (ref 0.40–1.50)
GFR: 73.35 mL/min (ref >60.00)
Glucose, Bld: 96 mg/dL (ref 70–99)
Potassium: 5 mEq/L (ref 3.5–5.1)
Sodium: 137 mEq/L (ref 135–145)

## 2020-04-08 MED ORDER — ATORVASTATIN CALCIUM 10 MG PO TABS: 10.0000 mg | ORAL_TABLET | Freq: Every day | ORAL | 3 refills | Status: DC

## 2020-04-08 NOTE — Assessment & Plan Note (Signed)
Lab Results  Component Value Date   LDLCALC 145 (H) 04/06/2019   Stable, pt to start lipitor 10, for lower chol diet

## 2020-04-08 NOTE — Assessment & Plan Note (Signed)
Resolved, ok to d/c tamsulosin

## 2020-04-08 NOTE — Assessment & Plan Note (Signed)
Ok to d/c the b12 IM, continue b12 oral only, f/u lab

## 2020-04-08 NOTE — Assessment & Plan Note (Signed)
PVCs per ECG, ok to follow, no evidence for afib

## 2020-04-08 NOTE — Assessment & Plan Note (Signed)
BP Readings from Last 3 Encounters:  04/08/20 (!) 152/85  04/06/19 136/80  12/05/18 134/88   Stable, pt to continue medical treatment norvasc   Current Outpatient Medications (Cardiovascular):  .  amLODipine (NORVASC) 2.5 MG tablet, TAKE 1 TABLET BY MOUTH EVERY DAY .  atorvastatin (LIPITOR) 10 MG tablet, Take 1 tablet (10 mg total) by mouth daily.   Current Outpatient Medications (Analgesics):  .  allopurinol (ZYLOPRIM) 100 MG tablet, TAKE 1 TABLET BY MOUTH TWICE A DAY FOR GOUT  Current Outpatient Medications (Hematological):  Marland Kitchen  Cyanocobalamin (B-12) 3000 MCG CAPS, Take by mouth daily.  Current Outpatient Medications (Other):  .  docusate sodium (COLACE) 100 MG capsule, Take 100 mg by mouth once. Marland Kitchen  Leuprolide Acetate, 6 Month, (LUPRON DEPOT, 15-MONTH, IM), Inject into the muscle every 6 (six) months. Marland Kitchen  omeprazole (PRILOSEC) 40 MG capsule, TAKE 1 CAPSULE BY MOUTH EVERY DAY .  Vitamin D, Ergocalciferol, (DRISDOL) 1.25 MG (50000 UNIT) CAPS capsule, Take 1 capsule (50,000 Units total) by mouth every 7 (seven) days.

## 2020-04-08 NOTE — Progress Notes (Signed)
Patient ID: Stephen Meza, male   DOB: February 12, 1934, 87 y.o.   MRN: [id number]         Chief Complaint:: wellness exam and b12 deficiency, HLD, and irregular heart beats       HPI:  Stephen Meza is a 87 y.o. male here for wellness exam, o/w up to date   Wt Readings from Last 3 Encounters:  04/08/20 183 lb (83 kg)  06/21/19 183 lb 6.4 oz (83.2 kg)  04/06/19 188 lb (85.3 kg)   BP Readings from Last 3 Encounters:  04/08/20 (!) 152/85  04/06/19 136/80  12/05/18 134/88   Immunization History  Administered Date(s) Administered  . Fluad Quad(high Dose 65+) 10/27/2018, 11/27/2019  . Influenza Whole 12/23/2006, 12/17/2008, 11/27/2011  . Influenza, High Dose Seasonal PF 12/16/2017, 10/31/2018  . Influenza-Unspecified 12/08/2014, 11/23/2016  . PFIZER(Purple Top)SARS-COV-2 Vaccination 03/11/2019, 04/01/2019, 12/08/2019  . Pneumococcal Conjugate-13 05/01/2013  . Pneumococcal Polysaccharide-23 12/23/2006  . Td 10/23/2008  . Tdap 04/06/2019  . Zoster 12/23/2006  . Zoster Recombinat (Shingrix) 08/03/2017, 10/09/2017   There are no preventive care reminders to display for this patient.       Also has been getting b12 IM and oral, likely no longer needs IM.  Has not been taking the statin lipitor 10, but willing to start now.  Also has irregular heart beats on exam today but Pt denies chest pain, increased sob or doe, wheezing, orthopnea, PND, increased LE swelling, palpitations, dizziness or syncope.  Pt denies new neurological symptoms such as new headache, or facial or extremity weakness or numbness   Pt denies polydipsia, polyuria, Denies urinary symptoms such as dysuria, frequency, urgency, flank pain, hematuria or n/v, fever, chills.    Past Medical History:  Diagnosis Date  . DUODENITIS, WITHOUT HEMORRHAGE 07/14/2007  . ERECTILE DYSFUNCTION 07/14/2007  . Esophageal stricture 03/19/2016  . ESOPHAGITIS 07/14/2007  . EXTERNAL HEMORRHOIDS 07/14/2007  . GERD 07/14/2007  . Gout   .  HYPERTENSION 07/14/2007  . PERIPHERAL NEUROPATHY 07/14/2007  . Peripheral neuropathy 04/06/2019  . PERIPHERAL VASCULAR DISEASE 07/14/2007  . PODAGRA 07/14/2007  . Prostate cancer ([place]) 02/2012   pt had screening and then biopsy  . Retinal tear of right eye 03/19/2016  . TOBACCO ABUSE, HX OF 02/12/2010  . TONSILLECTOMY, HX OF 07/14/2007  . TREMOR, ESSENTIAL 07/14/2007  . VITAMIN B12 DEFICIENCY 07/14/2007   Past Surgical History:  Procedure Laterality Date  . Excision of lipoma    . PROSTATE BIOPSY Bilateral 03/10/2012  . surgery to reduce turbinate and straighten deviated septum  2016  . TONSILLECTOMY      reports that he quit smoking about 26 years ago. He has quit using smokeless tobacco. He reports previous alcohol use of about 30.0 standard drinks of alcohol per week. He reports that he does not use drugs. family history includes Breast cancer in his sister and another family member; Cancer in his mother; Hypothyroidism in his daughter; Other in his father. Allergies  Allergen Reactions  . Nitrofuran Derivatives Rash  . Ciprofloxacin Hcl Other (See Comments)    numbness   Current Outpatient Medications on File Prior to Visit  Medication Sig Dispense Refill  . allopurinol (ZYLOPRIM) 100 MG tablet TAKE 1 TABLET BY MOUTH TWICE A DAY FOR GOUT 180 tablet 4  . amLODipine (NORVASC) 2.5 MG tablet TAKE 1 TABLET BY MOUTH EVERY DAY 90 tablet 1  . Cyanocobalamin (B-12) 3000 MCG CAPS Take by mouth daily.    Marland Kitchen docusate sodium (COLACE) 100 MG  capsule Take 100 mg by mouth once.    Marland Kitchen Leuprolide Acetate, 6 Month, (LUPRON DEPOT, 17-MONTH, IM) Inject into the muscle every 6 (six) months.    Marland Kitchen omeprazole (PRILOSEC) 40 MG capsule TAKE 1 CAPSULE BY MOUTH EVERY DAY 90 capsule 2  . Vitamin D, Ergocalciferol, (DRISDOL) 1.25 MG (50000 UNIT) CAPS capsule Take 1 capsule (50,000 Units total) by mouth every 7 (seven) days. 12 capsule 0   No current facility-administered medications on file prior to visit.         ROS:  All others reviewed and negative.  Objective        PE:  BP (!) 152/85   Pulse 82   Temp 98.5 F (36.9 C) (Oral)   Ht 6' (1.829 m)   Wt 183 lb (83 kg)   SpO2 97%   BMI 24.82 kg/m                 Constitutional: Pt appears in NAD               HENT: Head: NCAT.                Right Ear: External ear normal.                 Left Ear: External ear normal.                Eyes: . Pupils are equal, round, and reactive to light. Conjunctivae and EOM are normal               Nose: without d/c or deformity               Neck: Neck supple. Gross normal ROM               Cardiovascular: Normal rate and regular rhythm.                 Pulmonary/Chest: Effort normal and breath sounds without rales or wheezing.                Abd:  Soft, NT, ND, + BS, no organomegaly               Neurological: Pt is alert. At baseline orientation, motor grossly intact               Skin: Skin is warm. No rashes, no other new lesions, LE edema - none               Psychiatric: Pt behavior is normal without agitation   Micro: none  Cardiac tracings I have personally interpreted today:  ECG - SR with PVC 81  Pertinent Radiological findings (summarize): none   Lab Results  Component Value Date   WBC 5.9 04/06/2019   HGB 13.1 04/06/2019   HCT 40.1 04/06/2019   PLT 197.0 04/06/2019   GLUCOSE 92 04/06/2019   CHOL 216 (H) 04/06/2019   TRIG 96.0 04/06/2019   HDL 52.20 04/06/2019   LDLDIRECT 93.0 03/15/2014   LDLCALC 145 (H) 04/06/2019   ALT 10 04/06/2019   AST 16 04/06/2019   NA 139 04/06/2019   K 5.3 No hemolysis seen (H) 04/06/2019   CL 103 04/06/2019   CREATININE 0.91 04/06/2019   BUN 15 04/06/2019   CO2 27 04/06/2019   TSH 3.47 04/06/2019   PSA 3.54 02/12/2010   HGBA1C 5.4 04/04/2018   Assessment/Plan:  Stephen Meza is a 87 y.o. White or Caucasian [1] male with  has a past medical history of DUODENITIS, WITHOUT HEMORRHAGE (07/14/2007), ERECTILE DYSFUNCTION (07/14/2007), Esophageal  stricture (03/19/2016), ESOPHAGITIS (07/14/2007), EXTERNAL HEMORRHOIDS (07/14/2007), GERD (07/14/2007), Gout, HYPERTENSION (07/14/2007), PERIPHERAL NEUROPATHY (07/14/2007), Peripheral neuropathy (04/06/2019), PERIPHERAL VASCULAR DISEASE (07/14/2007), PODAGRA (07/14/2007), Prostate cancer ([place]) (02/2012), Retinal tear of right eye (03/19/2016), TOBACCO ABUSE, HX OF (02/12/2010), TONSILLECTOMY, HX OF (07/14/2007), TREMOR, ESSENTIAL (07/14/2007), and VITAMIN B12 DEFICIENCY (07/14/2007).  BPH with obstruction/lower urinary tract symptoms Resolved, ok to d/c tamsulosin  Encounter for well adult exam with abnormal findings Age and sex appropriate education and counseling updated with regular exercise and diet Referrals for preventative services - none needed Immunizations addressed - none needed Smoking counseling  - none needed Evidence for depression or other mood disorder - none significant Most recent labs reviewed. I have personally reviewed and have noted: 1) the patient's medical and social history 2) The patient's current medications and supplements 3) The patient's height, weight, and BMI have been recorded in the chart   Essential hypertension BP Readings from Last 3 Encounters:  04/08/20 (!) 152/85  04/06/19 136/80  12/05/18 134/88   Stable, pt to continue medical treatment norvasc   Current Outpatient Medications (Cardiovascular):  .  amLODipine (NORVASC) 2.5 MG tablet, TAKE 1 TABLET BY MOUTH EVERY DAY .  atorvastatin (LIPITOR) 10 MG tablet, Take 1 tablet (10 mg total) by mouth daily.   Current Outpatient Medications (Analgesics):  .  allopurinol (ZYLOPRIM) 100 MG tablet, TAKE 1 TABLET BY MOUTH TWICE A DAY FOR GOUT  Current Outpatient Medications (Hematological):  Marland Kitchen  Cyanocobalamin (B-12) 3000 MCG CAPS, Take by mouth daily.  Current Outpatient Medications (Other):  .  docusate sodium (COLACE) 100 MG capsule, Take 100 mg by mouth once. Marland Kitchen  Leuprolide Acetate, 6 Month, (LUPRON DEPOT,  33-MONTH, IM), Inject into the muscle every 6 (six) months. Marland Kitchen  omeprazole (PRILOSEC) 40 MG capsule, TAKE 1 CAPSULE BY MOUTH EVERY DAY .  Vitamin D, Ergocalciferol, (DRISDOL) 1.25 MG (50000 UNIT) CAPS capsule, Take 1 capsule (50,000 Units total) by mouth every 7 (seven) days.   Hyperlipidemia with target LDL less than 130 Lab Results  Component Value Date   LDLCALC 145 (H) 04/06/2019   Stable, pt to start lipitor 10, for lower chol diet  Irregular heart beats PVCs per ECG, ok to follow, no evidence for afib  Vitamin B12 deficiency Ok to d/c the b12 IM, continue b12 oral only, f/u lab  Followup: Return in about 6 months (around 10/06/2020).  Cathlean Cower, MD 04/08/2020 8:38 PM [facility]

## 2020-04-08 NOTE — Assessment & Plan Note (Signed)

## 2020-04-08 NOTE — Patient Instructions (Addendum)
Please take all new medication as prescribed - the lipitor 10 mg per day  Your EKG was [facility] today  Ok to stop the B12 shots, and continue to take the B12 pills  Please continue all other medications as before, and refills have been done if requested.  Please have the pharmacy call with any other refills you may need.  Please continue your efforts at being more active, low cholesterol diet, and weight control.  You are otherwise up to date with prevention measures today.  Please keep your appointments with your specialists as you may have planned  Please go to the LAB at the blood drawing area for the tests to be done  You will be contacted by phone if any changes need to be made immediately.  Otherwise, you will receive a letter about your results with an explanation, but please check with MyChart first.  Please remember to sign up for MyChart if you have not done so, as this will be important to you in the future with finding out test results, communicating by private email, and scheduling acute appointments online when needed.  Please make an Appointment to return in 6 months, or sooner if needed

## 2020-04-09 ENCOUNTER — Encounter: Payer: Self-pay | Admitting: Internal Medicine

## 2020-04-09 ENCOUNTER — Other Ambulatory Visit: Payer: Self-pay | Admitting: Internal Medicine

## 2020-04-09 LAB — URINALYSIS, ROUTINE W REFLEX MICROSCOPIC
Bilirubin Urine: NEGATIVE
Hgb urine dipstick: NEGATIVE
Ketones, ur: NEGATIVE
Nitrite: NEGATIVE
RBC / HPF: NONE SEEN (ref 0–?)
Specific Gravity, Urine: 1.015 (ref 1.000–1.030)
Total Protein, Urine: NEGATIVE
Urine Glucose: NEGATIVE
Urobilinogen, UA: 0.2 (ref 0.0–1.0)
pH: 5.5 (ref 5.0–8.0)

## 2020-04-09 MED ORDER — ATORVASTATIN CALCIUM 20 MG PO TABS: 20.0000 mg | ORAL_TABLET | Freq: Every day | ORAL | 3 refills | Status: DC

## 2020-04-24 DIAGNOSIS — D224 Melanocytic nevi of scalp and neck: Secondary | ICD-10-CM | POA: Diagnosis not present

## 2020-04-24 DIAGNOSIS — Z85828 Personal history of other malignant neoplasm of skin: Secondary | ICD-10-CM | POA: Diagnosis not present

## 2020-04-24 DIAGNOSIS — L821 Other seborrheic keratosis: Secondary | ICD-10-CM | POA: Diagnosis not present

## 2020-04-24 DIAGNOSIS — B351 Tinea unguium: Secondary | ICD-10-CM | POA: Diagnosis not present

## 2020-04-24 DIAGNOSIS — H61002 Unspecified perichondritis of left external ear: Secondary | ICD-10-CM | POA: Diagnosis not present

## 2020-04-24 DIAGNOSIS — D225 Melanocytic nevi of trunk: Secondary | ICD-10-CM | POA: Diagnosis not present

## 2020-04-24 DIAGNOSIS — D692 Other nonthrombocytopenic purpura: Secondary | ICD-10-CM | POA: Diagnosis not present

## 2020-04-24 DIAGNOSIS — L570 Actinic keratosis: Secondary | ICD-10-CM | POA: Diagnosis not present

## 2020-04-24 DIAGNOSIS — D1801 Hemangioma of skin and subcutaneous tissue: Secondary | ICD-10-CM | POA: Diagnosis not present

## 2020-05-06 ENCOUNTER — Telehealth: Payer: Self-pay | Admitting: Internal Medicine

## 2020-05-06 MED ORDER — GABAPENTIN 100 MG PO CAPS: 100.0000 mg | ORAL_CAPSULE | Freq: Three times a day (TID) | ORAL | 3 refills | Status: DC

## 2020-05-06 NOTE — Telephone Encounter (Signed)
Patient/spouse calling to request new order for Meloxicam or Gabapentin for leg pain

## 2020-05-06 NOTE — Telephone Encounter (Signed)
Ok for gabapentin - done erx

## 2020-06-18 ENCOUNTER — Other Ambulatory Visit: Payer: Self-pay | Admitting: Internal Medicine

## 2020-06-18 NOTE — Telephone Encounter (Signed)
Please refill as per office routine med refill policy (all routine meds refilled for 3 mo or monthly per pt preference up to one year from last visit, then month to month grace period for 3 mo, then further med refills will have to be denied)  

## 2020-06-25 IMAGING — MR MRI LUMBAR SPINE WITHOUT CONTRAST
4 of 5 series · 18 of 48 positions shown · non-contrast
Comparison: CT abdomen pelvis 04/27/2017

CLINICAL DATA: Bilateral foot numbness for years.  Low back pain.

EXAM:
MRI LUMBAR SPINE WITHOUT CONTRAST
TECHNIQUE: Multiplanar, multisequence MR imaging of the lumbar spine was
performed. No intravenous contrast was administered.

[Series 6: T2 · sagittal · 4.0mm · 0.73mm/px · 6 of 16 slices shown (1 of 2)]
[im 1/16]
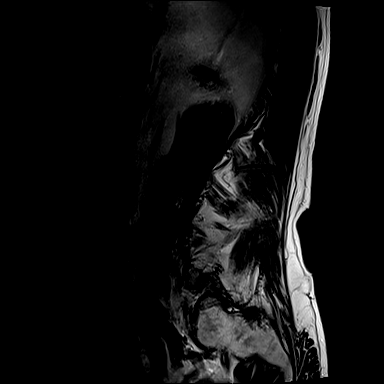
[im 4/16]
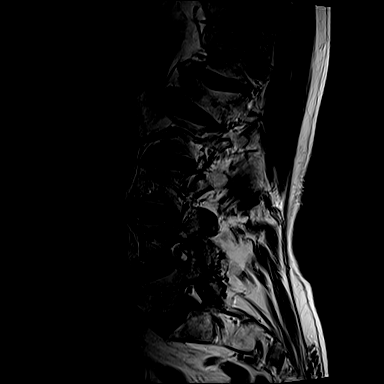
[im 7/16]
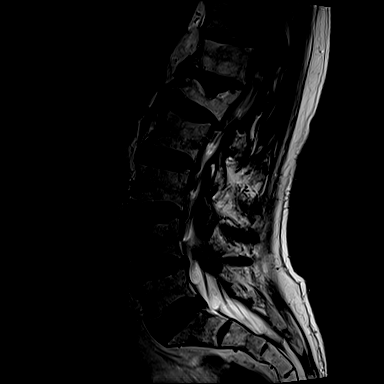
[im 10/16]
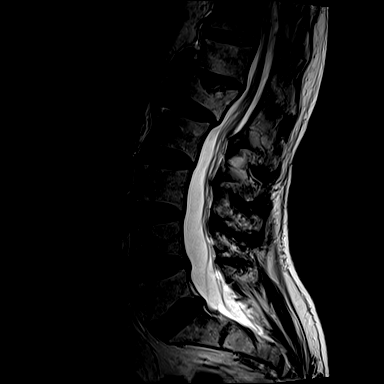
[im 13/16]
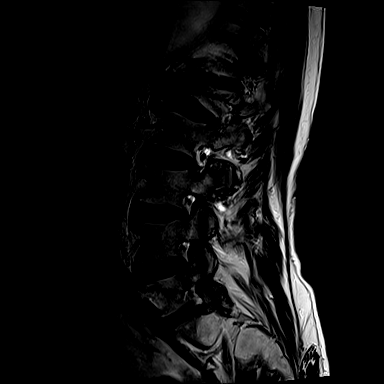
[im 16/16]
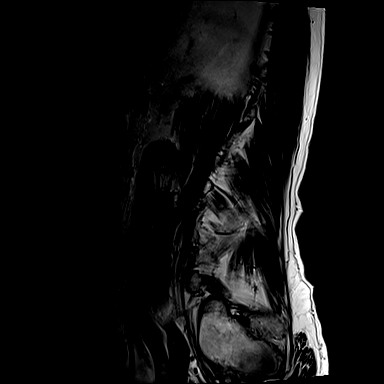

[Series 7: T1 · sagittal · 4.0mm · 0.73mm/px · 3 of 16 slices shown (1 of 2)]
[im 4/16]
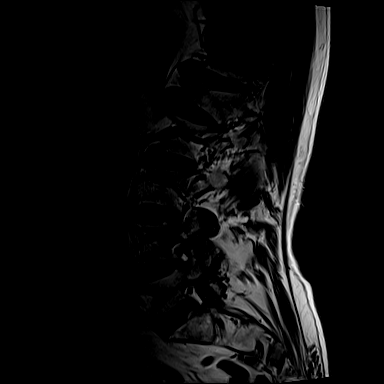
[im 10/16]
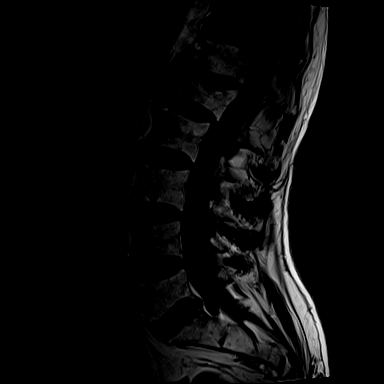
[im 16/16]
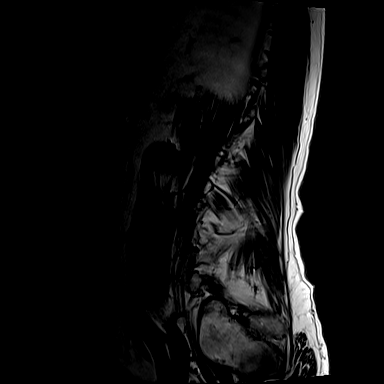

[Series 11: T1 · axial · 4.0mm · 0.28mm/px · z∈[-70,+119]mm · 3 of 43 slices shown (2 of 2)]
[im 7/43]
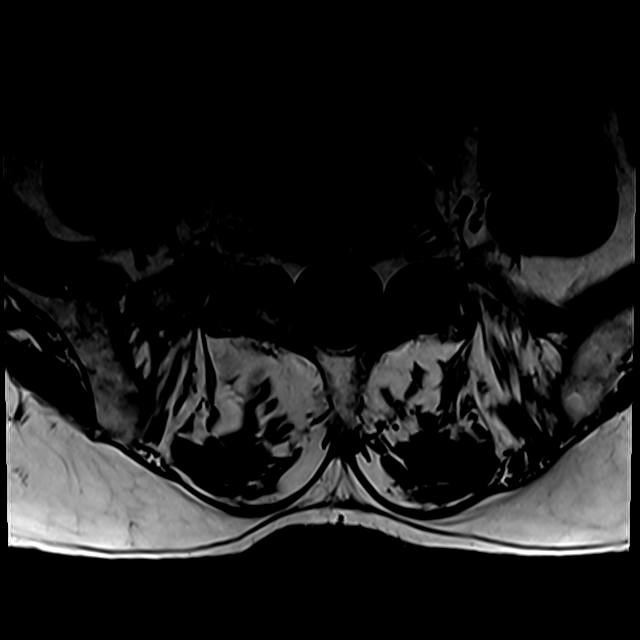
[im 22/43]
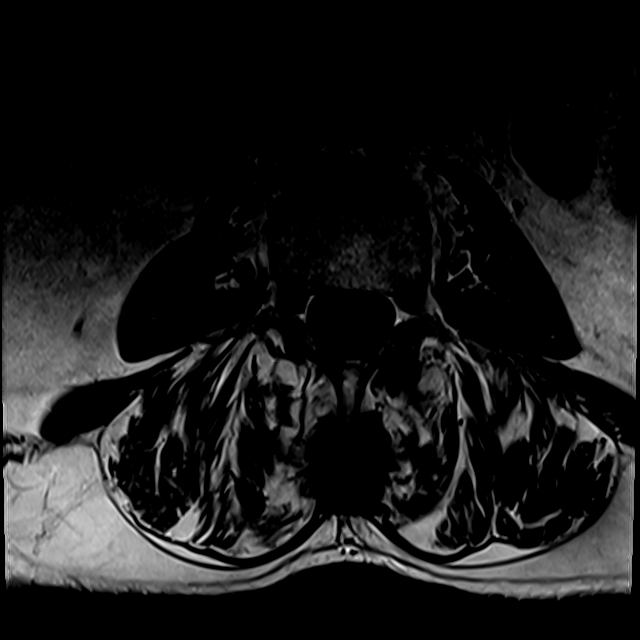
[im 37/43]
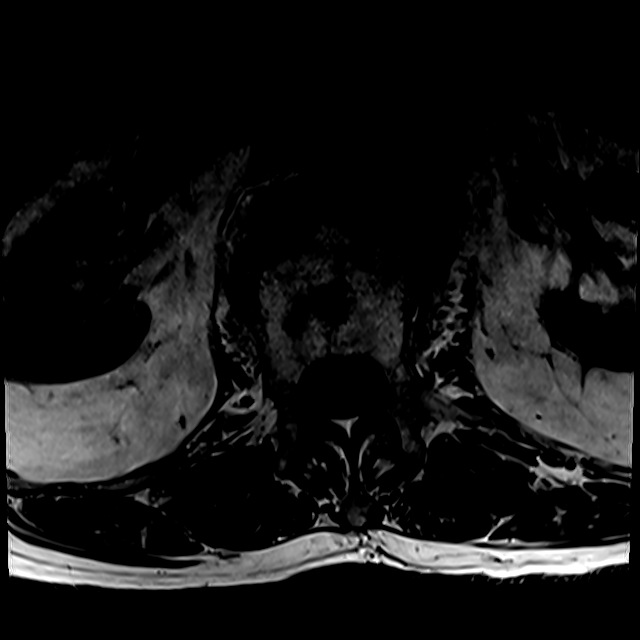

[Series 14: T2 · axial · 4.0mm · 0.28mm/px · z∈[-98,+119]mm · 6 of 43 slices shown (2 of 2)]
[im 1/43]
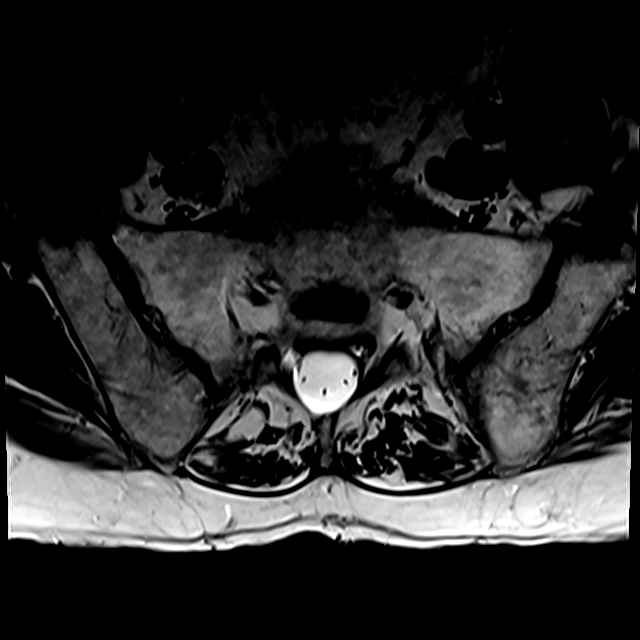
[im 7/43]
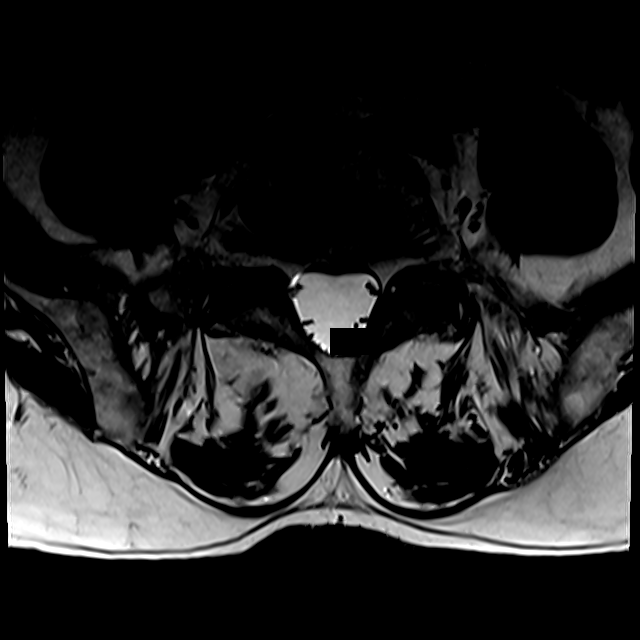
[im 13/43]
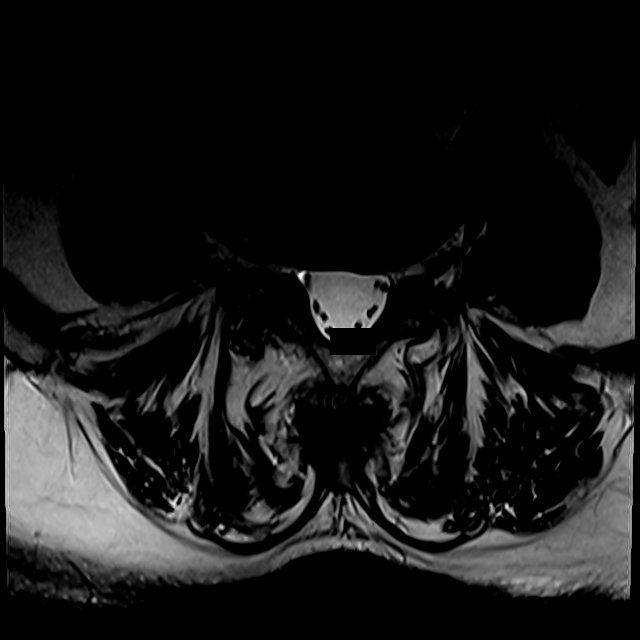
[im 19/43]
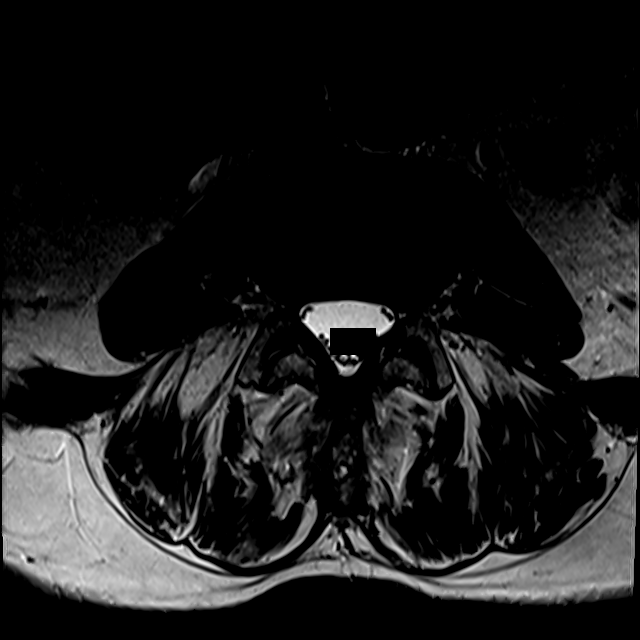
[im 22/43]
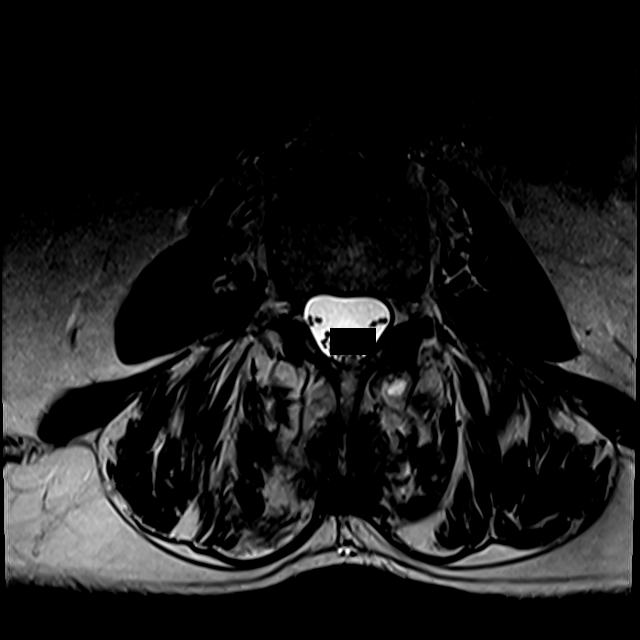
[im 37/43]
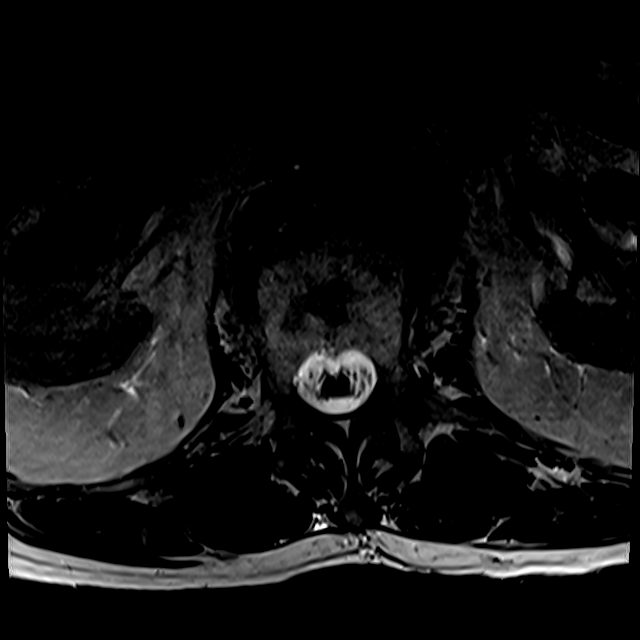

[18 of 48 positions shown; findings below may reference images not displayed]

FINDINGS: Segmentation:  Standard.

Alignment:  Physiologic.

Vertebrae: Chronic compression deformity of L1, unchanged since
04/27/2017.

Conus medullaris and cauda equina: Conus extends to the L2 level.
Conus and cauda equina appear normal.

Paraspinal and other soft tissues: Infrarenal abdominal aortic
aneurysm measures up to 3.8 cm in transverse, non orthogonal
measurement.

Disc levels:

T11-12: Normal.

T12-L1: Mild disc bulge without spinal canal or neural foraminal
stenosis.

L1-2: Mild disc bulge with moderate right and mild left neural
foraminal stenosis. No spinal canal stenosis.

L2-3: Diffuse mild disc bulge without spinal canal or neural
foraminal stenosis.

L3-4: Mild disc bulge without spinal canal or neural foraminal
stenosis.

L4-5: Diffuse mild disc bulge without spinal canal or neural
foraminal stenosis.

L5-S1: Disc desiccation and minor diffuse bulge. Mild right facet
hypertrophy. No spinal canal or neural foraminal stenosis.
IMPRESSION: 1. Unchanged appearance of compression deformity of L1 compared to
04/27/2017.
2. Multilevel degenerative disc disease without spinal canal
stenosis.
3. Moderate right L1 neural foraminal stenosis.
4. 3.8 cm infrarenal abdominal aortic aneurysm. Recommend followup
by ultrasound in 2 years. This recommendation follows ACR consensus
guidelines: White Paper of the ACR Incidental Findings Committee II
aneurysm NOS ([id number].5)

## 2020-08-07 ENCOUNTER — Ambulatory Visit (INDEPENDENT_AMBULATORY_CARE_PROVIDER_SITE_OTHER): Payer: Medicare Other | Admitting: Internal Medicine

## 2020-08-07 ENCOUNTER — Other Ambulatory Visit: Payer: Self-pay

## 2020-08-07 ENCOUNTER — Encounter: Payer: Self-pay | Admitting: Internal Medicine

## 2020-08-07 ENCOUNTER — Ambulatory Visit (INDEPENDENT_AMBULATORY_CARE_PROVIDER_SITE_OTHER): Payer: Medicare Other

## 2020-08-07 VITALS — BP 142/88 | HR 88 | Temp 98.5°F | Ht 72.0 in | Wt 183.0 lb

## 2020-08-07 VITALS — BP 130/80 | HR 65 | Temp 98.2°F | Ht 72.0 in | Wt 180.4 lb

## 2020-08-07 DIAGNOSIS — R739 Hyperglycemia, unspecified: Secondary | ICD-10-CM

## 2020-08-07 DIAGNOSIS — W57XXXA Bitten or stung by nonvenomous insect and other nonvenomous arthropods, initial encounter: Secondary | ICD-10-CM | POA: Diagnosis not present

## 2020-08-07 DIAGNOSIS — E785 Hyperlipidemia, unspecified: Secondary | ICD-10-CM | POA: Diagnosis not present

## 2020-08-07 DIAGNOSIS — Z0000 Encounter for general adult medical examination without abnormal findings: Secondary | ICD-10-CM | POA: Diagnosis not present

## 2020-08-07 DIAGNOSIS — S80861A Insect bite (nonvenomous), right lower leg, initial encounter: Secondary | ICD-10-CM

## 2020-08-07 DIAGNOSIS — I10 Essential (primary) hypertension: Secondary | ICD-10-CM | POA: Diagnosis not present

## 2020-08-07 MED ORDER — DOXYCYCLINE HYCLATE 100 MG PO TABS: 100.0000 mg | ORAL_TABLET | Freq: Two times a day (BID) | ORAL | 0 refills | Status: DC

## 2020-08-07 NOTE — Patient Instructions (Signed)
Stephen Meza , Thank you for taking time to come for your Medicare Wellness Visit. I appreciate your ongoing commitment to your health goals. Please review the following plan we discussed and let me know if I can assist you in the future.   Screening recommendations/referrals: Colonoscopy: no repeat due to age Recommended yearly ophthalmology/optometry visit for glaucoma screening and checkup Recommended yearly dental visit for hygiene and checkup  Vaccinations: Influenza vaccine: 11/27/2019 Pneumococcal vaccine: 12/23/2006, 05/01/2013 Tdap vaccine: 03/27/2019; due every 10 years Shingles vaccine: 08/03/2017, 10/09/2017   Covid-19: 03/11/2019, 04/01/2019, 12/08/2019  Advanced directives: Please bring a copy of your health care power of attorney and living will to the office at your convenience.  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  Next appointment: Please schedule your next Medicare Wellness Visit with your Nurse Health Advisor in 1 year by calling [phone].  Preventive Care 85 Years and Older, Male Preventive care refers to lifestyle choices and visits with your health care provider that can promote health and wellness. What does preventive care include? A yearly physical exam. This is also called an annual well check. Dental exams once or twice a year. Routine eye exams. Ask your health care provider how often you should have your eyes checked. Personal lifestyle choices, including: Daily care of your teeth and gums. Regular physical activity. Eating a healthy diet. Avoiding tobacco and drug use. Limiting alcohol use. Practicing safe sex. Taking low doses of aspirin every day. Taking vitamin and mineral supplements as recommended by your health care provider. What happens during an annual well check? The services and screenings done by your health care provider during your annual well check will depend on your age, overall  health, lifestyle risk factors, and family history of disease. Counseling  Your health care provider may ask you questions about your: Alcohol use. Tobacco use. Drug use. Emotional well-being. Home and relationship well-being. Sexual activity. Eating habits. History of falls. Memory and ability to understand (cognition). Work and work Statistician. Screening  You may have the following tests or measurements: Height, weight, and BMI. Blood pressure. Lipid and cholesterol levels. These may be checked every 5 years, or more frequently if you are over 42 years old. Skin check. Lung cancer screening. You may have this screening every year starting at age 22 if you have a 30-pack-year history of smoking and currently smoke or have quit within the past 15 years. Fecal occult blood test (FOBT) of the stool. You may have this test every year starting at age 37. Flexible sigmoidoscopy or colonoscopy. You may have a sigmoidoscopy every 5 years or a colonoscopy every 10 years starting at age 77. Prostate cancer screening. Recommendations will vary depending on your family history and other risks. Hepatitis C blood test. Hepatitis B blood test. Sexually transmitted disease (STD) testing. Diabetes screening. This is done by checking your blood sugar (glucose) after you have not eaten for a while (fasting). You may have this done every 1-3 years. Abdominal aortic aneurysm (AAA) screening. You may need this if you are a current or former smoker. Osteoporosis. You may be screened starting at age 72 if you are at high risk. Talk with your health care provider about your test results, treatment options, and if necessary, the need for more tests. Vaccines  Your health care provider may recommend certain vaccines, such as: Influenza vaccine. This is recommended every year. Tetanus, diphtheria, and acellular pertussis (Tdap, Td) vaccine. You may need a Td  booster every 10 years. Zoster vaccine. You may  need this after age 108. Pneumococcal 13-valent conjugate (PCV13) vaccine. One dose is recommended after age 25. Pneumococcal polysaccharide (PPSV23) vaccine. One dose is recommended after age 72. Talk to your health care provider about which screenings and vaccines you need and how often you need them. This information is not intended to replace advice given to you by your health care provider. Make sure you discuss any questions you have with your health care provider. Document Released: 03/08/2015 Document Revised: 10/30/2015 Document Reviewed: 12/11/2014 Elsevier Interactive Patient Education  2017 [place] Prevention in the Home Falls can cause injuries. They can happen to people of all ages. There are many things you can do to make your home safe and to help prevent falls. What can I do on the outside of my home? Regularly fix the edges of walkways and driveways and fix any cracks. Remove anything that might make you trip as you walk through a door, such as a raised step or threshold. Trim any bushes or trees on the path to your home. Use bright outdoor lighting. Clear any walking paths of anything that might make someone trip, such as rocks or tools. Regularly check to see if handrails are loose or broken. Make sure that both sides of any steps have handrails. Any raised decks and porches should have guardrails on the edges. Have any leaves, snow, or ice cleared regularly. Use sand or salt on walking paths during winter. Clean up any spills in your garage right away. This includes oil or grease spills. What can I do in the bathroom? Use night lights. Install grab bars by the toilet and in the tub and shower. Do not use towel bars as grab bars. Use non-skid mats or decals in the tub or shower. If you need to sit down in the shower, use a plastic, non-slip stool. Keep the floor dry. Clean up any water that spills on the floor as soon as it happens. Remove soap buildup in  the tub or shower regularly. Attach bath mats securely with double-sided non-slip rug tape. Do not have throw rugs and other things on the floor that can make you trip. What can I do in the bedroom? Use night lights. Make sure that you have a light by your bed that is easy to reach. Do not use any sheets or blankets that are too big for your bed. They should not hang down onto the floor. Have a firm chair that has side arms. You can use this for support while you get dressed. Do not have throw rugs and other things on the floor that can make you trip. What can I do in the kitchen? Clean up any spills right away. Avoid walking on wet floors. Keep items that you use a lot in easy-to-reach places. If you need to reach something above you, use a strong step stool that has a grab bar. Keep electrical cords out of the way. Do not use floor polish or wax that makes floors slippery. If you must use wax, use non-skid floor wax. Do not have throw rugs and other things on the floor that can make you trip. What can I do with my stairs? Do not leave any items on the stairs. Make sure that there are handrails on both sides of the stairs and use them. Fix handrails that are broken or loose. Make sure that handrails are as long as the stairways. Check any carpeting  to make sure that it is firmly attached to the stairs. Fix any carpet that is loose or worn. Avoid having throw rugs at the top or bottom of the stairs. If you do have throw rugs, attach them to the floor with carpet tape. Make sure that you have a light switch at the top of the stairs and the bottom of the stairs. If you do not have them, ask someone to add them for you. What else can I do to help prevent falls? Wear shoes that: Do not have high heels. Have rubber bottoms. Are comfortable and fit you well. Are closed at the toe. Do not wear sandals. If you use a stepladder: Make sure that it is fully opened. Do not climb a closed  stepladder. Make sure that both sides of the stepladder are locked into place. Ask someone to hold it for you, if possible. Clearly mark and make sure that you can see: Any grab bars or handrails. First and last steps. Where the edge of each step is. Use tools that help you move around (mobility aids) if they are needed. These include: Canes. Walkers. Scooters. Crutches. Turn on the lights when you go into a dark area. Replace any light bulbs as soon as they burn out. Set up your furniture so you have a clear path. Avoid moving your furniture around. If any of your floors are uneven, fix them. If there are any pets around you, be aware of where they are. Review your medicines with your doctor. Some medicines can make you feel dizzy. This can increase your chance of falling. Ask your doctor what other things that you can do to help prevent falls. This information is not intended to replace advice given to you by your health care provider. Make sure you discuss any questions you have with your health care provider. Document Released: 12/06/2008 Document Revised: 07/18/2015 Document Reviewed: 03/16/2014 Elsevier Interactive Patient Education  2017 [employer].

## 2020-08-07 NOTE — Progress Notes (Signed)
Subjective:   Stephen Meza is a 87 y.o. male who presents for Medicare Annual/Subsequent preventive examination.  Review of Systems     Cardiac Risk Factors include: advanced age (>37men, >58 women);dyslipidemia;hypertension;male gender     Objective:    Today's Vitals   08/07/20 1238  BP: 130/80  Pulse: 65  Temp: 98.2 F (36.8 C)  TempSrc: Temporal  SpO2: 95%  Weight: 180 lb 6.4 oz (81.8 kg)  Height: 6' (1.829 m)   Body mass index is 24.47 kg/m.  Advanced Directives 08/07/2020 06/21/2019  Does Patient Have a Medical Advance Directive? Yes Yes  Type of Advance Directive Living will;Healthcare Power of [place];Living will  Does patient want to make changes to medical advance directive? No - Patient declined No - Patient declined  Copy of [place] in Chart? No - copy requested No - copy requested    Current Medications (verified) Outpatient Encounter Medications as of 08/07/2020  Medication Sig   allopurinol (ZYLOPRIM) 100 MG tablet TAKE 1 TABLET BY MOUTH TWICE A DAY FOR GOUT   amLODipine (NORVASC) 2.5 MG tablet TAKE 1 TABLET BY MOUTH EVERY DAY   atorvastatin (LIPITOR) 20 MG tablet Take 1 tablet (20 mg total) by mouth daily.   Cyanocobalamin (B-12) 3000 MCG CAPS Take by mouth daily.   docusate sodium (COLACE) 100 MG capsule Take 100 mg by mouth once.   gabapentin (NEURONTIN) 100 MG capsule Take 1 capsule (100 mg total) by mouth 3 (three) times daily.   Leuprolide Acetate, 6 Month, (LUPRON DEPOT, 48-MONTH, IM) Inject into the muscle every 6 (six) months.   omeprazole (PRILOSEC) 40 MG capsule TAKE 1 CAPSULE BY MOUTH EVERY DAY   No facility-administered encounter medications on file as of 08/07/2020.    Allergies (verified) Nitrofuran derivatives and Ciprofloxacin hcl   History: Past Medical History:  Diagnosis Date   DUODENITIS, WITHOUT HEMORRHAGE 07/14/2007   ERECTILE DYSFUNCTION 07/14/2007   Esophageal  stricture 03/19/2016   ESOPHAGITIS 07/14/2007   EXTERNAL HEMORRHOIDS 07/14/2007   GERD 07/14/2007   Gout    HYPERTENSION 07/14/2007   PERIPHERAL NEUROPATHY 07/14/2007   Peripheral neuropathy 04/06/2019   PERIPHERAL VASCULAR DISEASE 07/14/2007   PODAGRA 07/14/2007   Prostate cancer ([place]) 02/2012   pt had screening and then biopsy   Retinal tear of right eye 03/19/2016   TOBACCO ABUSE, HX OF 02/12/2010   TONSILLECTOMY, HX OF 07/14/2007   TREMOR, ESSENTIAL 07/14/2007   VITAMIN B12 DEFICIENCY 07/14/2007   Past Surgical History:  Procedure Laterality Date   Excision of lipoma     PROSTATE BIOPSY Bilateral 03/10/2012   surgery to reduce turbinate and straighten deviated septum  2016   TONSILLECTOMY     Family History  Problem Relation Age of Onset   Hypothyroidism Daughter    Breast cancer Other    Cancer Mother    Other Father    Breast cancer Sister    Social History   Socioeconomic History   Marital status: Married    Spouse name: Not on file   Number of children: 2   Years of education: 12   Highest education level: Not on file  Occupational History   Occupation: retired    Fish farm manager: RETIRED  Tobacco Use   Smoking status: Former    Pack years: 0.00    Types: Cigarettes    Quit date: 02/23/1994    Years since quitting: 26.4   Smokeless tobacco: Former  Scientific laboratory technician  Use: Never used  Substance and Sexual Activity   Alcohol use: Not Currently    Comment: d/c beer about 5 months ago   Drug use: No   Sexual activity: Yes    Partners: Female  Other Topics Concern   Not on file  Social History Narrative   HSG. Married '1958. 2 dtrs; 4 g-children. Work - retired. 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       Not disabled      Retired since 1986      Right handed      Two story home   Social Determinants of Health   Financial Resource Strain: Low Risk    Difficulty of Paying Living Expenses: Not  hard at all  Food Insecurity: No Food Insecurity   Worried About Charity fundraiser in the Last Year: Never true   Arboriculturist in the Last Year: Never true  Transportation Needs: No Transportation Needs   Lack of Transportation (Medical): No   Lack of Transportation (Non-Medical): No  Physical Activity: Sufficiently Active   Days of Exercise per Week: 5 days   Minutes of Exercise per Session: 30 min  Stress: No Stress Concern Present   Feeling of Stress : Not at all  Social Connections: Socially Integrated   Frequency of Communication with Friends and Family: More than three times a week   Frequency of Social Gatherings with Friends and Family: More than three times a week   Attends Religious Services: More than 4 times per year   Active Member of [employer] or Organizations: Yes   Attends Music therapist: More than 4 times per year   Marital Status: Married    Tobacco Counseling Counseling given: Not Answered   Clinical Intake:  Pre-visit preparation completed: Yes  Pain : No/denies pain     BMI - recorded: 24.47 Nutritional Status: BMI of 19-24  Normal Nutritional Risks: None Diabetes: No  How often do you need to have someone help you when you read instructions, pamphlets, or other written materials from your doctor or pharmacy?: 1 - Never What is the last grade level you completed in school?: High School Graduate  Diabetic?no  Interpreter Needed?: No  Information entered by :: Lisette Abu, LPN   Activities of Daily Living In your present state of health, do you have any difficulty performing the following activities: 08/07/2020 08/07/2020  Hearing? N Y  Vision? N Y  Difficulty concentrating or making decisions? N N  Walking or climbing stairs? N N  Dressing or bathing? N N  Doing errands, shopping? N N  Preparing Food and eating ? N -  Using the Toilet? N -  In the past six months, have you accidently leaked urine? N -  Do you have  problems with loss of bowel control? N -  Managing your Medications? N -  Managing your Finances? N -  Some recent data might be hidden    Patient Care Team: Biagio Borg, MD as PCP - General (Internal Medicine) Lynnell Dike, [place] as Consulting Physician (Optometry) Darleen Crocker, MD as Consulting Physician (Ophthalmology)  Indicate any recent Medical Services you may have received from other than [facility] providers in the past year (date may be approximate).     Assessment:   This is a routine wellness examination for [place].  Hearing/Vision screen Hearing Screening - Comments:: Patient has no issues with  hearing.  Does not wear hearing aids. Vision Screening - Comments:: No issues with seeing; wear glasses to read fine print.  Eye exam done once a year by Cleon Gustin, OD and Darleen Crocker, MD.  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       Depression Screen PHQ 2/9 Scores 08/07/2020 04/08/2020 06/21/2019 04/06/2019 04/04/2018 03/23/2017 03/19/2016  PHQ - 2 Score 0 0 0 0 0 0 0  PHQ- 9 Score - - - - - 0 -    Fall Risk Fall Risk  08/07/2020 04/08/2020 06/21/2019 04/06/2019 08/01/2018  Falls in the past year? 0 0 1 0 1  Number falls in past yr: 0 - 0 - 0  Comment - - - - -  Injury with Fall? 0 - 1 - 0  Comment - - scraped back of arm and elbow; fell in the garden - -  Risk Factor Category  - - - - -  Comment - - - - -  Risk for fall due to :  No Fall Risks - Other (Comment) - Impaired balance/gait  Risk for fall due to: Comment - - accidently tripped over some weeds in the garden - -  Follow up Falls evaluation completed - Falls evaluation completed;Education provided;Falls prevention discussed - Falls evaluation completed    FALL RISK PREVENTION PERTAINING TO THE HOME:  Any stairs in or around the home? Yes  If so, are there any without handrails? No  Home free of loose throw rugs in walkways, pet beds, electrical cords, etc? Yes  Adequate lighting in your home to reduce risk of falls? Yes   ASSISTIVE DEVICES UTILIZED TO PREVENT FALLS:  Life alert? No  Use of a cane, walker or w/c? No  Grab bars in the bathroom? No  Shower chair or bench in shower? Yes  Elevated toilet seat or a handicapped toilet? No   TIMED UP AND GO:  Was the test performed? Yes .  Length of time to ambulate 10 feet: 8 sec.   Gait steady and fast without use of assistive device  Cognitive Function: Normal cognitive status assessed by direct observation by this Nurse Health Advisor. No abnormalities found.     Montreal Cognitive Assessment  08/01/2018  Visuospatial/ Executive (0/5) 3  Naming (0/3) 2  Attention: Read list of digits (0/2) 2  Attention: Read list of letters (0/1) 1  Attention: Serial 7 subtraction starting at 100 (0/3) 3  Language: Repeat phrase (0/2) 1  Language : Fluency (0/1) 0  Abstraction (0/2) 0  Delayed Recall (0/5) 0  Orientation (0/6) 5  Total 17  Adjusted Score (based on education) 18      Immunizations Immunization History  Administered Date(s) Administered   Fluad Quad(high Dose 65+) 10/27/2018, 11/27/2019   Influenza Whole 12/23/2006, 12/17/2008, 11/27/2011   Influenza, High Dose Seasonal PF 12/16/2017, 10/31/2018   Influenza-Unspecified 12/08/2014, 11/23/2016   PFIZER(Purple Top)SARS-COV-2 Vaccination 03/11/2019, 04/01/2019, 12/08/2019   Pneumococcal Conjugate-13  05/01/2013   Pneumococcal  Polysaccharide-23 12/23/2006   Td 10/23/2008   Tdap 04/06/2019   Zoster Recombinat (Shingrix) 08/03/2017, 10/09/2017   Zoster, Live 12/23/2006    TDAP status: Up to date  Flu Vaccine status: Up to date  Pneumococcal vaccine status: Up to date  Covid-19 vaccine status: Completed vaccines  Qualifies for Shingles Vaccine? Yes   Zostavax completed Yes   Shingrix Completed?: Yes  Screening Tests Health Maintenance  Topic Date Due   COVID-19 Vaccine (4 - Booster for Pfizer series) 03/09/2020   INFLUENZA VACCINE  09/23/2020   TETANUS/TDAP  04/05/2029   PNA vac Low Risk Adult  Completed   Zoster Vaccines- Shingrix  Completed   HPV VACCINES  Aged Out    Health Maintenance  Health Maintenance Due  Topic Date Due   COVID-19 Vaccine (4 - Booster for Pfizer series) 03/09/2020    Colorectal cancer screening: No longer required.   Lung Cancer Screening: (Low Dose CT Chest recommended if Age 99-80 years, 30 pack-year currently smoking OR have quit w/in 15years.) does not qualify.   Lung Cancer Screening Referral: no  Additional Screening:  Hepatitis C Screening: does not qualify; Completed no  Vision Screening: Recommended annual ophthalmology exams for early detection of glaucoma and other disorders of the eye. Is the patient up to date with their annual eye exam?  Yes  Who is the provider or what is the name of the office in which the patient attends annual eye exams? Cleon Gustin, OD and Darleen Crocker, MD If pt is not established with a provider, would they like to be referred to a provider to establish care? No .   Dental Screening: Recommended annual dental exams for proper oral hygiene  Community Resource Referral / Chronic Care Management: CRR required this visit?  No   CCM required this visit?  No      Plan:     I have personally reviewed and noted the following in the patient's chart:   Medical and social history Use of alcohol, tobacco or illicit drugs   Current medications and supplements including opioid prescriptions. Patient is not currently taking opioid prescriptions. Functional ability and status Nutritional status Physical activity Advanced directives List of other physicians Hospitalizations, surgeries, and ER visits in previous 12 months Vitals Screenings to include cognitive, depression, and falls Referrals and appointments  In addition, I have reviewed and discussed with patient certain preventive protocols, quality metrics, and best practice recommendations. A written personalized care plan for preventive services as well as general preventive health recommendations were provided to patient.     Sheral Flow, LPN   [id number]   Nurse Notes: n/a

## 2020-08-07 NOTE — Patient Instructions (Signed)
Please take all new medication as prescribed - the antibiotic  Please continue all other medications as before, and refills have been done if requested.  Please have the pharmacy call with any other refills you may need.  Please keep your appointments with your specialists as you may have planned  Please go to the LAB at the blood drawing area for the tests to be done - just the lyme test today  You will be contacted by phone if any changes need to be made immediately.  Otherwise, you will receive a letter about your results with an explanation, but please check with MyChart first.  Please remember to sign up for MyChart if you have not done so, as this will be important to you in the future with finding out test results, communicating by private email, and scheduling acute appointments online when needed.

## 2020-08-07 NOTE — Progress Notes (Signed)
Patient ID: Stephen Meza, male   DOB: 03-25-1933, 87 y.o.   MRN: [id number]        Chief Complaint: follow up HTN, HLD and hyperglycemia and recent tick bite       HPI:  Stephen Meza is a 87 y.o. male here with c/o tick bite from last weekend now tick removed, but unfortuantely now 2 days worsening red, tender, swelling without red streaks, fever, chills, or drainage.  Pt denies chest pain, increased sob or doe, wheezing, orthopnea, PND, increased LE swelling, palpitations, dizziness or syncope.  Pt denies polydipsia, polyuria, or new focal neuro s/s.   Pt denies fever, wt loss, night sweats, loss of appetite, or other constitutional symptoms  No other new complaints  Seems to tolerate statin ok       Wt Readings from Last 3 Encounters:  08/07/20 180 lb 6.4 oz (81.8 kg)  08/07/20 183 lb (83 kg)  04/08/20 183 lb (83 kg)   BP Readings from Last 3 Encounters:  08/07/20 130/80  08/07/20 (!) 142/88  04/08/20 (!) 152/85         Past Medical History:  Diagnosis Date   DUODENITIS, WITHOUT HEMORRHAGE 07/14/2007   ERECTILE DYSFUNCTION 07/14/2007   Esophageal stricture 03/19/2016   ESOPHAGITIS 07/14/2007   EXTERNAL HEMORRHOIDS 07/14/2007   GERD 07/14/2007   Gout    HYPERTENSION 07/14/2007   PERIPHERAL NEUROPATHY 07/14/2007   Peripheral neuropathy 04/06/2019   PERIPHERAL VASCULAR DISEASE 07/14/2007   PODAGRA 07/14/2007   Prostate cancer ([place]) 02/2012   pt had screening and then biopsy   Retinal tear of right eye 03/19/2016   TOBACCO ABUSE, HX OF 02/12/2010   TONSILLECTOMY, HX OF 07/14/2007   TREMOR, ESSENTIAL 07/14/2007   VITAMIN B12 DEFICIENCY 07/14/2007   Past Surgical History:  Procedure Laterality Date   Excision of lipoma     PROSTATE BIOPSY Bilateral 03/10/2012   surgery to reduce turbinate and straighten deviated septum  2016   TONSILLECTOMY      reports that he quit smoking about 26 years ago. He has quit using smokeless tobacco. He reports previous alcohol use. He reports  that he does not use drugs. family history includes Breast cancer in his sister and another family member; Cancer in his mother; Hypothyroidism in his daughter; Other in his father. Allergies  Allergen Reactions   Nitrofuran Derivatives Rash   Ciprofloxacin Hcl Other (See Comments)    numbness   Current Outpatient Medications on File Prior to Visit  Medication Sig Dispense Refill   allopurinol (ZYLOPRIM) 100 MG tablet TAKE 1 TABLET BY MOUTH TWICE A DAY FOR GOUT 180 tablet 4   amLODipine (NORVASC) 2.5 MG tablet TAKE 1 TABLET BY MOUTH EVERY DAY 90 tablet 1   atorvastatin (LIPITOR) 20 MG tablet Take 1 tablet (20 mg total) by mouth daily. 90 tablet 3   Cyanocobalamin (B-12) 3000 MCG CAPS Take by mouth daily.     docusate sodium (COLACE) 100 MG capsule Take 100 mg by mouth once.     gabapentin (NEURONTIN) 100 MG capsule Take 1 capsule (100 mg total) by mouth 3 (three) times daily. 90 capsule 3   Leuprolide Acetate, 6 Month, (LUPRON DEPOT, 69-MONTH, IM) Inject into the muscle every 6 (six) months.     omeprazole (PRILOSEC) 40 MG capsule TAKE 1 CAPSULE BY MOUTH EVERY DAY 90 capsule 2   No current facility-administered medications on file prior to visit.        ROS:  All others reviewed and  negative.  Objective        PE:  BP (!) 142/88 (BP Location: Right Arm, Patient Position: Sitting, Cuff Size: Normal)   Pulse 88   Temp 98.5 F (36.9 C) (Oral)   Ht 6' (1.829 m)   Wt 183 lb (83 kg)   SpO2 93%   BMI 24.82 kg/m                 Constitutional: Pt appears in NAD               HENT: Head: NCAT.                Right Ear: External ear normal.                 Left Ear: External ear normal.                Eyes: . Pupils are equal, round, and reactive to light. Conjunctivae and EOM are normal               Nose: without d/c or deformity               Neck: Neck supple. Gross normal ROM               Cardiovascular: Normal rate and regular rhythm.                 Pulmonary/Chest: Effort  normal and breath sounds without rales or wheezing.                Abd:  Soft, NT, ND, + BS, no organomegaly               Neurological: Pt is alert. At baseline orientation, motor grossly intact               Skin:  LE edema - none, tick bite right lower leg approx 3 cm prox to ankle with 2 cm surrounding red, tender, swelling without flucutuance or drainage               Psychiatric: Pt behavior is normal without agitation   Micro: none  Cardiac tracings I have personally interpreted today:  none  Pertinent Radiological findings (summarize): none   Lab Results  Component Value Date   WBC 6.1 04/08/2020   HGB 12.9 (L) 04/08/2020   HCT 38.5 (L) 04/08/2020   PLT 202.0 04/08/2020   GLUCOSE 96 04/08/2020   CHOL 199 04/08/2020   TRIG 123.0 04/08/2020   HDL 49.50 04/08/2020   LDLDIRECT 93.0 03/15/2014   LDLCALC 125 (H) 04/08/2020   ALT 11 04/08/2020   AST 16 04/08/2020   NA 137 04/08/2020   K 5.0 04/08/2020   CL 102 04/08/2020   CREATININE 0.95 04/08/2020   BUN 13 04/08/2020   CO2 29 04/08/2020   TSH 3.62 04/08/2020   PSA 3.54 02/12/2010   HGBA1C 5.4 04/04/2018   Assessment/Plan:  Stephen Meza is a 58 y.o. White or Caucasian [1] male with  has a past medical history of DUODENITIS, WITHOUT HEMORRHAGE (07/14/2007), ERECTILE DYSFUNCTION (07/14/2007), Esophageal stricture (03/19/2016), ESOPHAGITIS (07/14/2007), EXTERNAL HEMORRHOIDS (07/14/2007), GERD (07/14/2007), Gout, HYPERTENSION (07/14/2007), PERIPHERAL NEUROPATHY (07/14/2007), Peripheral neuropathy (04/06/2019), PERIPHERAL VASCULAR DISEASE (07/14/2007), PODAGRA (07/14/2007), Prostate cancer ([place]) (02/2012), Retinal tear of right eye (03/19/2016), TOBACCO ABUSE, HX OF (02/12/2010), TONSILLECTOMY, HX OF (07/14/2007), TREMOR, ESSENTIAL (07/14/2007), and VITAMIN B12 DEFICIENCY (07/14/2007).  Tick bite, infected .Mild to mod, for antibx course, check lyme dz Ab, to f/u  any worsening symptoms or concerns  Essential hypertension BP Readings  from Last 3 Encounters:  08/07/20 130/80  08/07/20 (!) 142/88  04/08/20 (!) 152/85   Improved today, pt to continue medical treatment amlodipine   Hyperglycemia Lab Results  Component Value Date   HGBA1C 5.4 04/04/2018   Stable, pt to continue current medical treatment  - diet   Hyperlipidemia with target LDL less than 130 Lab Results  Component Value Date   LDLCALC 125 (H) 04/08/2020   Stable, pt to continue current statin lipitor though goal ldl < 100, declines f/u lab today  Followup: Return in about 2 months (around 10/07/2020), or if symptoms worsen or fail to improve.  Cathlean Cower, MD 08/11/2020 11:46 AM [facility]

## 2020-08-09 ENCOUNTER — Telehealth: Payer: Self-pay | Admitting: Internal Medicine

## 2020-08-09 ENCOUNTER — Encounter: Payer: Self-pay | Admitting: Internal Medicine

## 2020-08-09 LAB — B. BURGDORFI ANTIBODIES BY WB

## 2020-08-09 MED ORDER — DOXYCYCLINE HYCLATE 100 MG PO TABS: 100.0000 mg | ORAL_TABLET | Freq: Two times a day (BID) | ORAL | 0 refills | Status: DC

## 2020-08-11 ENCOUNTER — Encounter: Payer: Self-pay | Admitting: Internal Medicine

## 2020-08-11 NOTE — Assessment & Plan Note (Signed)
Lab Results  Component Value Date   HGBA1C 5.4 04/04/2018   Stable, pt to continue current medical treatment  - diet

## 2020-08-11 NOTE — Assessment & Plan Note (Signed)
BP Readings from Last 3 Encounters:  08/07/20 130/80  08/07/20 (!) 142/88  04/08/20 (!) 152/85   Improved today, pt to continue medical treatment amlodipine

## 2020-08-11 NOTE — Assessment & Plan Note (Addendum)
.  Mild to mod, for antibx course, check lyme dz Ab, to f/u any worsening symptoms or concerns

## 2020-08-11 NOTE — Assessment & Plan Note (Addendum)
Lab Results  Component Value Date   LDLCALC 125 (H) 04/08/2020   Stable, pt to continue current statin lipitor though goal ldl < 100, declines f/u lab today

## 2020-08-19 NOTE — Telephone Encounter (Signed)
Did Dr Jenny Reichmann order a second round of doxycycline (VIBRA-TABS) 100 MG tablet  Patient has been taking first order, pharmacy filled a second order as well

## 2020-08-19 NOTE — Telephone Encounter (Signed)
Yes, ok for now to finish what he has

## 2020-08-20 NOTE — Telephone Encounter (Signed)
Unable to leave message phone kept ringing

## 2020-09-05 ENCOUNTER — Other Ambulatory Visit: Payer: Self-pay | Admitting: Internal Medicine

## 2020-09-05 DIAGNOSIS — C678 Malignant neoplasm of overlapping sites of bladder: Secondary | ICD-10-CM | POA: Diagnosis not present

## 2020-10-07 ENCOUNTER — Ambulatory Visit: Payer: Medicare Other | Admitting: Internal Medicine

## 2020-10-09 DIAGNOSIS — H353131 Nonexudative age-related macular degeneration, bilateral, early dry stage: Secondary | ICD-10-CM | POA: Diagnosis not present

## 2020-10-09 DIAGNOSIS — Z961 Presence of intraocular lens: Secondary | ICD-10-CM | POA: Diagnosis not present

## 2020-10-09 DIAGNOSIS — H47291 Other optic atrophy, right eye: Secondary | ICD-10-CM | POA: Diagnosis not present

## 2020-10-09 DIAGNOSIS — H338 Other retinal detachments: Secondary | ICD-10-CM | POA: Diagnosis not present

## 2020-10-09 DIAGNOSIS — H02884 Meibomian gland dysfunction left upper eyelid: Secondary | ICD-10-CM | POA: Diagnosis not present

## 2020-10-09 DIAGNOSIS — H43392 Other vitreous opacities, left eye: Secondary | ICD-10-CM | POA: Diagnosis not present

## 2020-10-11 ENCOUNTER — Ambulatory Visit: Payer: Medicare Other | Admitting: Internal Medicine

## 2020-11-01 ENCOUNTER — Other Ambulatory Visit: Payer: Self-pay

## 2020-11-01 ENCOUNTER — Encounter: Payer: Self-pay | Admitting: Internal Medicine

## 2020-11-01 ENCOUNTER — Ambulatory Visit (INDEPENDENT_AMBULATORY_CARE_PROVIDER_SITE_OTHER): Payer: Medicare Other | Admitting: Internal Medicine

## 2020-11-01 VITALS — BP 140/80 | HR 42 | Temp 98.9°F | Ht 72.0 in | Wt 183.0 lb

## 2020-11-01 DIAGNOSIS — E559 Vitamin D deficiency, unspecified: Secondary | ICD-10-CM

## 2020-11-01 DIAGNOSIS — Z23 Encounter for immunization: Secondary | ICD-10-CM | POA: Diagnosis not present

## 2020-11-01 DIAGNOSIS — E538 Deficiency of other specified B group vitamins: Secondary | ICD-10-CM

## 2020-11-01 DIAGNOSIS — R739 Hyperglycemia, unspecified: Secondary | ICD-10-CM | POA: Diagnosis not present

## 2020-11-01 DIAGNOSIS — I10 Essential (primary) hypertension: Secondary | ICD-10-CM

## 2020-11-01 DIAGNOSIS — E785 Hyperlipidemia, unspecified: Secondary | ICD-10-CM | POA: Diagnosis not present

## 2020-11-01 DIAGNOSIS — G629 Polyneuropathy, unspecified: Secondary | ICD-10-CM | POA: Diagnosis not present

## 2020-11-01 LAB — LIPID PANEL
Cholesterol: 228 mg/dL — ABNORMAL HIGH (ref 0–200)
HDL: 56.7 mg/dL (ref >39.00)
LDL Cholesterol: 147 mg/dL — ABNORMAL HIGH (ref 0–99)
NonHDL: 170.92
Total CHOL/HDL Ratio: 4
Triglycerides: 121 mg/dL (ref 0.0–149.0)
VLDL: 24.2 mg/dL (ref 0.0–40.0)

## 2020-11-01 LAB — BASIC METABOLIC PANEL
BUN: 14 mg/dL (ref 6–23)
CO2: 29 mEq/L (ref 19–32)
Calcium: 9.6 mg/dL (ref 8.4–10.5)
Chloride: 102 mEq/L (ref 96–112)
Creatinine, Ser: 0.96 mg/dL (ref 0.40–1.50)
GFR: 72.15 mL/min (ref >60.00)
Glucose, Bld: 93 mg/dL (ref 70–99)
Potassium: 4.3 mEq/L (ref 3.5–5.1)
Sodium: 138 mEq/L (ref 135–145)

## 2020-11-01 LAB — HEPATIC FUNCTION PANEL
ALT: 10 U/L (ref 0–53)
AST: 16 U/L (ref 0–37)
Albumin: 4.3 g/dL (ref 3.5–5.2)
Alkaline Phosphatase: 88 U/L (ref 39–117)
Bilirubin, Direct: 0.1 mg/dL (ref 0.0–0.3)
Total Bilirubin: 0.5 mg/dL (ref 0.2–1.2)
Total Protein: 7.5 g/dL (ref 6.0–8.3)

## 2020-11-01 LAB — VITAMIN D 25 HYDROXY (VIT D DEFICIENCY, FRACTURES): VITD: 50.24 ng/mL (ref 30.00–100.00)

## 2020-11-01 LAB — VITAMIN B12: Vitamin B-12: 447 pg/mL (ref 211–911)

## 2020-11-01 LAB — HEMOGLOBIN A1C: Hgb A1c MFr Bld: 5.9 % (ref 4.6–6.5)

## 2020-11-01 MED ORDER — ATORVASTATIN CALCIUM 20 MG PO TABS: 20.0000 mg | ORAL_TABLET | Freq: Every day | ORAL | 3 refills | Status: DC

## 2020-11-01 MED ORDER — GABAPENTIN 100 MG PO CAPS
200.0000 mg | ORAL_CAPSULE | Freq: Three times a day (TID) | ORAL | 1 refills | Status: DC
Start: 2020-11-01 — End: 2021-11-07

## 2020-11-01 NOTE — Patient Instructions (Addendum)
We have discussed the Cardiac CT Score test to measure the calcification level (if any) in your heart arteries.  This test has been ordered in our [facility], so please call [facility] directly, as they prefer this, at [phone] to be scheduled.  Ok to start the lipitor at 20 mg per day  Ok to increase the gabapentin 200 mg at three times per day, and call if you feel you need 300 mg in a week or two  Please continue all other medications as before, and refills have been done if requested.  Please have the pharmacy call with any other refills you may need.  Please continue your efforts at being more active, low cholesterol diet, and weight control.  You are otherwise up to date with prevention measures today.  Please keep your appointments with your specialists as you may have planned  Please go to the LAB at the blood drawing area for the tests to be done  You will be contacted by phone if any changes need to be made immediately.  Otherwise, you will receive a letter about your results with an explanation, but please check with MyChart first.  Please remember to sign up for MyChart if you have not done so, as this will be important to you in the future with finding out test results, communicating by private email, and scheduling acute appointments online when needed.  Please make an Appointment to return in 6 months, or sooner if needed, also with Lab Appointment for testing done 3-5 days before at the [facility] (so this is for TWO appointments - please see the scheduling desk as you leave)  Due to the ongoing Covid 19 pandemic, our lab now requires an appointment for any labs done at our office.  If you need labs done and do not have an appointment, please call our office ahead of time to schedule before presenting to the lab for your testing.

## 2020-11-01 NOTE — Progress Notes (Signed)
Patient ID: Jannifer Franklin A. Moro, male   DOB: November 27, 1933, 87 y.o.   MRN: [id number]        Chief Complaint: follow up HTN, HLD and PAD, neuropathy       HPI:  Bairon A. Schaible is a 87 y.o. male here with wife, voerall doing ok, not taking statin, trying to follow low chol diet.  Has stable leg pain with exertions, though neuritic pain was not improved with gabapentin 100, willing to try higher dose.  Pt denies chest pain, increased sob or doe, wheezing, orthopnea, PND, increased LE swelling, palpitations, dizziness or syncope.  Willing ofr cardiac CT score.   Pt denies polydipsia, polyuria, or new focal neuro s/s.        Wt Readings from Last 3 Encounters:  11/01/20 183 lb (83 kg)  08/07/20 180 lb 6.4 oz (81.8 kg)  08/07/20 183 lb (83 kg)   BP Readings from Last 3 Encounters:  11/01/20 140/80  08/07/20 130/80  08/07/20 (!) 142/88         Past Medical History:  Diagnosis Date   DUODENITIS, WITHOUT HEMORRHAGE 07/14/2007   ERECTILE DYSFUNCTION 07/14/2007   Esophageal stricture 03/19/2016   ESOPHAGITIS 07/14/2007   EXTERNAL HEMORRHOIDS 07/14/2007   GERD 07/14/2007   Gout    HYPERTENSION 07/14/2007   PERIPHERAL NEUROPATHY 07/14/2007   Peripheral neuropathy 04/06/2019   PERIPHERAL VASCULAR DISEASE 07/14/2007   PODAGRA 07/14/2007   Prostate cancer ([place]) 02/2012   pt had screening and then biopsy   Retinal tear of right eye 03/19/2016   TOBACCO ABUSE, HX OF 02/12/2010   TONSILLECTOMY, HX OF 07/14/2007   TREMOR, ESSENTIAL 07/14/2007   VITAMIN B12 DEFICIENCY 07/14/2007   Past Surgical History:  Procedure Laterality Date   Excision of lipoma     PROSTATE BIOPSY Bilateral 03/10/2012   surgery to reduce turbinate and straighten deviated septum  2016   TONSILLECTOMY      reports that he quit smoking about 26 years ago. His smoking use included cigarettes. He has quit using smokeless tobacco. He reports that he does not currently use alcohol. He reports that he does not use drugs. family history  includes Breast cancer in his sister and another family member; Cancer in his mother; Hypothyroidism in his daughter; Other in his father. Allergies  Allergen Reactions   Nitrofuran Derivatives Rash   Ciprofloxacin Hcl Other (See Comments)    numbness   Current Outpatient Medications on File Prior to Visit  Medication Sig Dispense Refill   allopurinol (ZYLOPRIM) 100 MG tablet TAKE 1 TABLET BY MOUTH TWICE A DAY FOR GOUT 180 tablet 4   amLODipine (NORVASC) 2.5 MG tablet TAKE 1 TABLET BY MOUTH EVERY DAY 90 tablet 1   Cyanocobalamin (B-12) 3000 MCG CAPS Take by mouth daily.     docusate sodium (COLACE) 100 MG capsule Take 100 mg by mouth once.     omeprazole (PRILOSEC) 40 MG capsule TAKE 1 CAPSULE BY MOUTH EVERY DAY 90 capsule 2   No current facility-administered medications on file prior to visit.        ROS:  All others reviewed and negative.  Objective        PE:  BP 140/80 (BP Location: Left Arm, Patient Position: Sitting, Cuff Size: Normal)   Pulse (!) 42   Temp 98.9 F (37.2 C) (Oral)   Ht 6' (1.829 m)   Wt 183 lb (83 kg)   SpO2 96%   BMI 24.82 kg/m  Constitutional: Pt appears in NAD               HENT: Head: NCAT.                Right Ear: External ear normal.                 Left Ear: External ear normal.                Eyes: . Pupils are equal, round, and reactive to light. Conjunctivae and EOM are normal               Nose: without d/c or deformity               Neck: Neck supple. Gross normal ROM               Cardiovascular: Normal rate and regular rhythm.                 Pulmonary/Chest: Effort normal and breath sounds without rales or wheezing.                Abd:  Soft, NT, ND, + BS, no organomegaly               Neurological: Pt is alert. At baseline orientation, motor grossly intact               Skin: Skin is warm. No rashes, no other new lesions, LE edema - none               Psychiatric: Pt behavior is normal without agitation   Micro:  none  Cardiac tracings I have personally interpreted today:  none  Pertinent Radiological findings (summarize): none   Lab Results  Component Value Date   WBC 6.1 04/08/2020   HGB 12.9 (L) 04/08/2020   HCT 38.5 (L) 04/08/2020   PLT 202.0 04/08/2020   GLUCOSE 93 11/01/2020   CHOL 228 (H) 11/01/2020   TRIG 121.0 11/01/2020   HDL 56.70 11/01/2020   LDLDIRECT 93.0 03/15/2014   LDLCALC 147 (H) 11/01/2020   ALT 10 11/01/2020   AST 16 11/01/2020   NA 138 11/01/2020   K 4.3 11/01/2020   CL 102 11/01/2020   CREATININE 0.96 11/01/2020   BUN 14 11/01/2020   CO2 29 11/01/2020   TSH 3.62 04/08/2020   PSA 3.54 02/12/2010   HGBA1C 5.9 11/01/2020   Assessment/Plan:  Kush A. Stickle is a 87 y.o. White or Caucasian [1] male with  has a past medical history of DUODENITIS, WITHOUT HEMORRHAGE (07/14/2007), ERECTILE DYSFUNCTION (07/14/2007), Esophageal stricture (03/19/2016), ESOPHAGITIS (07/14/2007), EXTERNAL HEMORRHOIDS (07/14/2007), GERD (07/14/2007), Gout, HYPERTENSION (07/14/2007), PERIPHERAL NEUROPATHY (07/14/2007), Peripheral neuropathy (04/06/2019), PERIPHERAL VASCULAR DISEASE (07/14/2007), PODAGRA (07/14/2007), Prostate cancer ([place]) (02/2012), Retinal tear of right eye (03/19/2016), TOBACCO ABUSE, HX OF (02/12/2010), TONSILLECTOMY, HX OF (07/14/2007), TREMOR, ESSENTIAL (07/14/2007), and VITAMIN B12 DEFICIENCY (07/14/2007).  Vitamin B12 deficiency Lab Results  Component Value Date   [id number] 04/06/2019   Stable, cont oral replacement - b12 1000 mcg qd, was on shots for several years   Peripheral neuropathy Uncontroled, for increased gagapentin to 200 tid  Hyperglycemia Lab Results  Component Value Date   HGBA1C 5.9 11/01/2020   Stable, pt to continue current medical treatment  - diet   Essential hypertension BP Readings from Last 3 Encounters:  11/01/20 140/80  08/07/20 130/80  08/07/20 (!) 142/88   Stable, pt to continue medical treatment norvasc   Hyperlipidemia with  target LDL  less than 130 Uncontrolled, given PAD, goal LDL is < 70 , pt did not start lipitor last visit, but now willing today to start,  to f/u any worsening symptoms or concerns, also for cardiac CT score  Followup: Return in about 6 months (around 05/01/2021).  Cathlean Cower, MD 11/02/2020 2:26 PM [facility]

## 2020-11-01 NOTE — Assessment & Plan Note (Signed)
Lab Results  Component Value Date   VITAMINB12 798 04/06/2019   Stable, cont oral replacement - b12 1000 mcg qd, was on shots for several years

## 2020-11-02 ENCOUNTER — Encounter: Payer: Self-pay | Admitting: Internal Medicine

## 2020-11-02 NOTE — Assessment & Plan Note (Signed)
Lab Results  Component Value Date   HGBA1C 5.9 11/01/2020   Stable, pt to continue current medical treatment  - diet

## 2020-11-02 NOTE — Addendum Note (Signed)
Addended by: Biagio Borg on: 11/02/2020 02:27 PM   Modules accepted: Orders

## 2020-11-02 NOTE — Assessment & Plan Note (Signed)
Uncontroled, for increased gagapentin to 200 tid

## 2020-11-02 NOTE — Assessment & Plan Note (Signed)
BP Readings from Last 3 Encounters:  11/01/20 140/80  08/07/20 130/80  08/07/20 (!) 142/88   Stable, pt to continue medical treatment norvasc

## 2020-11-02 NOTE — Assessment & Plan Note (Addendum)
Uncontrolled, given PAD, goal LDL is < 70 , pt did not start lipitor last visit, but now willing today to start,  to f/u any worsening symptoms or concerns, also for cardiac CT score

## 2020-11-15 ENCOUNTER — Telehealth: Payer: Self-pay | Admitting: Internal Medicine

## 2020-11-15 NOTE — Telephone Encounter (Signed)
Patient spouse calling in  Says patient has been complaining of increased joint pain in his legs since taking the atorvastatin (LIPITOR) 20 MG tablet  Wants to know if provider can send something else to pharmacy for patient

## 2020-11-18 NOTE — Telephone Encounter (Signed)
This seems unlikely to be the lipitor if the pain is legs only  Please hold the medication for 2 wks, then ok to retry the medication

## 2020-11-19 NOTE — Telephone Encounter (Signed)
Patients wife notified

## 2020-12-15 ENCOUNTER — Other Ambulatory Visit: Payer: Self-pay | Admitting: Internal Medicine

## 2020-12-15 NOTE — Telephone Encounter (Signed)
Please refill as per office routine med refill policy (all routine meds to be refilled for 3 mo or monthly (per pt preference) up to one year from last visit, then month to month grace period for 3 mo, then further med refills will have to be denied) ? ?

## 2021-01-08 ENCOUNTER — Telehealth: Payer: Self-pay | Admitting: Orthopedic Surgery

## 2021-01-08 NOTE — Telephone Encounter (Signed)
Can you please call pt and make an appt for tomorrow with Dr. Sharol Given. I will open whatever time you tell me

## 2021-01-08 NOTE — Telephone Encounter (Signed)
Jana Half called in regards to her husband who is the patient. He has multiple wounds on his left foot that may be infected. She is wondering if he could be worked in.  I did make her an appt for December 5 for this issue.

## 2021-01-08 NOTE — Telephone Encounter (Signed)
Called pt 2x to sch an appt, left message to call back and get sch'd with Sharol Given

## 2021-01-09 NOTE — Telephone Encounter (Signed)
Pts wife called and states that she is not able to come in today or tomorrow and requested and appt on Monday per the front desk. This was scheduled and will call if things should change.

## 2021-01-13 ENCOUNTER — Ambulatory Visit: Payer: Medicare Other | Admitting: Orthopedic Surgery

## 2021-01-13 ENCOUNTER — Encounter: Payer: Self-pay | Admitting: Orthopedic Surgery

## 2021-01-13 ENCOUNTER — Other Ambulatory Visit: Payer: Self-pay

## 2021-01-13 DIAGNOSIS — L97511 Non-pressure chronic ulcer of other part of right foot limited to breakdown of skin: Secondary | ICD-10-CM

## 2021-01-13 NOTE — Progress Notes (Signed)
Office Visit Note   Patient: Stephen Meza           Date of Birth: [birth year]           MRN: [id number] Visit Date: 01/13/2021              Requested by: Biagio Borg, MD [address] [place],  [place] [address] PCP: Biagio Borg, MD  Chief Complaint  Patient presents with   Right Foot - Pain      HPI: Patient is a 87 year old gentleman who presents with a painful ulcer with bleeding beneath the fifth metatarsal head right foot.  Patient states its been present for 6 months but began bleeding last week.  Patient states that all of his gout symptoms have resolved.  Assessment & Plan: Visit Diagnoses:  1. Non-pressure chronic ulcer of other part of right foot limited to breakdown of skin ([place])     Plan: Ulcer was debrided of skin and soft tissue.  Patient is provided a felt relieving donut to unload pressure from the ulcer.  Also recommended moist socks to help with his fungal changes in his feet  Follow-Up Instructions: Return in about 4 weeks (around 02/10/2021).   Ortho Exam  Patient is alert, oriented, no adenopathy, well-dressed, normal affect, normal respiratory effort. Examination patient has a good dorsalis pedis pulse he has a large ulcer beneath the fifth metatarsal head of the right foot there is no redness or cellulitis.  After informed consent a 10 blade knife was used to debride the skin and soft tissue back to healthy viable tissue this was touched with silver nitrate a Band-Aid was applied.  The ulcer was 1 cm prior to debridement after debridement the ulcer is 3 cm in diameter with healthy granulation tissue 3 mm deep there is no exposed bone or tendon.  Imaging: No results found. No images are attached to the encounter.  Labs: Lab Results  Component Value Date   HGBA1C 5.9 11/01/2020   HGBA1C 5.4 04/04/2018   HGBA1C 5.6 03/23/2017   ESRSEDRATE 19 08/01/2018   LABURIC 5.0 11/28/2012   LABURIC 6.4 03/10/2011   LABURIC 6.8 02/12/2010      Lab Results  Component Value Date   ALBUMIN 4.3 11/01/2020   ALBUMIN 4.4 04/08/2020   ALBUMIN 4.2 04/06/2019    No results found for: MG Lab Results  Component Value Date   VD25OH 50.24 11/01/2020   VD25OH 20.78 (L) 04/06/2019    No results found for: PREALBUMIN CBC EXTENDED Latest Ref Rng & Units 04/08/2020 04/06/2019 04/04/2018  WBC 4.0 - 10.5 K/uL 6.1 5.9 6.3  RBC 4.22 - 5.81 Mil/uL 4.41 4.51 4.46  HGB 13.0 - 17.0 g/dL 12.9(L) 13.1 14.3  HCT 39.0 - 52.0 % 38.5(L) 40.1 42.7  PLT 150.0 - 400.0 K/uL 202.0 197.0 216.0  NEUTROABS 1.4 - 7.7 K/uL 4.0 3.8 4.3  LYMPHSABS 0.7 - 4.0 K/uL 1.5 1.4 1.4     There is no height or weight on file to calculate BMI.  Orders:  No orders of the defined types were placed in this encounter.  No orders of the defined types were placed in this encounter.    Procedures: No procedures performed  Clinical Data: No additional findings.  ROS:  All other systems negative, except as noted in the HPI. Review of Systems  Objective: Vital Signs: There were no vitals taken for this visit.  Specialty Comments:  No specialty comments available.  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   Low grade fever 04/06/2019   Bilateral leg pain 04/04/2018   Bilateral bunions 04/04/2018   Ganglion cyst of dorsum of right wrist 04/04/2018   Chronic low back pain 04/04/2018   Gait disorder 04/04/2018   Recurrent falls while walking 04/04/2018   Abrasion of knee, bilateral 04/04/2018   Bladder cancer ([place]) 03/19/2016   Esophageal stricture 03/19/2016   Retinal tear of right eye 03/19/2016   Dizziness 03/19/2016   Hyperglycemia 03/16/2015   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   Prostate cancer ([place]) 04/13/2012   Hyperlipidemia  with target LDL less than 130 04/12/2012   Encounter for well adult exam with abnormal findings 03/11/2011   TOBACCO ABUSE, HX OF 02/12/2010   SHOULDER PAIN 10/10/2007   Vitamin B12 deficiency 07/14/2007   PODAGRA 07/14/2007   ERECTILE DYSFUNCTION 07/14/2007   TREMOR, ESSENTIAL 86/76/1950   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   Essential hypertension 07/14/2007   PERIPHERAL VASCULAR DISEASE 07/14/2007   EXTERNAL HEMORRHOIDS 07/14/2007   ESOPHAGITIS 07/14/2007   GERD 07/14/2007   TONSILLECTOMY, HX OF 07/14/2007   Past Medical History:  Diagnosis Date   DUODENITIS, WITHOUT HEMORRHAGE 07/14/2007   ERECTILE DYSFUNCTION 07/14/2007   Esophageal stricture 03/19/2016   ESOPHAGITIS 07/14/2007   EXTERNAL HEMORRHOIDS 07/14/2007   GERD 07/14/2007   Gout    HYPERTENSION 07/14/2007   PERIPHERAL NEUROPATHY 07/14/2007   Peripheral neuropathy 04/06/2019   PERIPHERAL VASCULAR DISEASE 07/14/2007   PODAGRA 07/14/2007   Prostate cancer ([place]) 02/2012   pt had screening and then biopsy   Retinal tear of right eye 03/19/2016   TOBACCO ABUSE, HX OF 02/12/2010   TONSILLECTOMY, HX OF 07/14/2007   TREMOR, ESSENTIAL 07/14/2007   VITAMIN B12 DEFICIENCY 07/14/2007    Family History  Problem Relation Age of Onset   Hypothyroidism Daughter    Breast cancer Other    Cancer Mother    Other Father    Breast cancer Sister     Past Surgical History:  Procedure Laterality Date   Excision of lipoma     PROSTATE BIOPSY Bilateral 03/10/2012   surgery to reduce turbinate and straighten deviated septum  2016   TONSILLECTOMY     Social History   Occupational History   Occupation: retired    Fish farm manager: RETIRED  Tobacco Use   Smoking status: Former    Types: Cigarettes    Quit date: 02/23/1994    Years since quitting: 26.9   Smokeless tobacco: Former  Scientific laboratory technician Use: Never used  Substance and Sexual Activity   Alcohol use: Not Currently    Comment: d/c beer about 5 months ago   Drug use: No    Sexual activity: Yes    Partners: Female

## 2021-01-27 ENCOUNTER — Ambulatory Visit: Payer: Medicare Other | Admitting: Orthopedic Surgery

## 2021-02-07 DIAGNOSIS — Z03818 Encounter for observation for suspected exposure to other biological agents ruled out: Secondary | ICD-10-CM | POA: Diagnosis not present

## 2021-02-07 DIAGNOSIS — Z20822 Contact with and (suspected) exposure to covid-19: Secondary | ICD-10-CM | POA: Diagnosis not present

## 2021-02-09 DIAGNOSIS — J029 Acute pharyngitis, unspecified: Secondary | ICD-10-CM | POA: Diagnosis not present

## 2021-02-09 DIAGNOSIS — J3489 Other specified disorders of nose and nasal sinuses: Secondary | ICD-10-CM | POA: Diagnosis not present

## 2021-02-09 DIAGNOSIS — M791 Myalgia, unspecified site: Secondary | ICD-10-CM | POA: Diagnosis not present

## 2021-02-10 ENCOUNTER — Ambulatory Visit: Payer: Medicare Other | Admitting: Orthopedic Surgery

## 2021-02-13 ENCOUNTER — Ambulatory Visit: Payer: Medicare Other | Admitting: Orthopedic Surgery

## 2021-02-13 ENCOUNTER — Other Ambulatory Visit: Payer: Self-pay

## 2021-02-13 ENCOUNTER — Encounter: Payer: Self-pay | Admitting: Orthopedic Surgery

## 2021-02-13 DIAGNOSIS — L97511 Non-pressure chronic ulcer of other part of right foot limited to breakdown of skin: Secondary | ICD-10-CM

## 2021-02-13 NOTE — Progress Notes (Signed)
Office Visit Note   Patient: Stephen Meza           Date of Birth: 1933-06-03           MRN: [id number] Visit Date: 02/13/2021              Requested by: Biagio Borg, MD [address] [place],  [place] [address] PCP: Biagio Borg, MD  Chief Complaint  Patient presents with   Right Foot - Wound Check      HPI: Patient is seen in follow-up for Wagner grade 1 ulcer fifth metatarsal head right foot he has been using the pressure relieving felt donut.  Assessment & Plan: Visit Diagnoses:  1. Non-pressure chronic ulcer of other part of right foot limited to breakdown of skin ([place])     Plan: Ulcer was debrided and continues to show interval healing.  Continue with the pressure offloading  Follow-Up Instructions: Return in about 4 weeks (around 03/13/2021).   Ortho Exam  Patient is alert, oriented, no adenopathy, well-dressed, normal affect, normal respiratory effort. Examination patient has good dorsalis pedis pulse he has dorsiflexion to neutral.  He has increased nonviable tissue around the ulcer fifth metatarsal head right foot there is no cellulitis no drainage no odor no signs of infection he does have varicose veins.  After informed consent a 10 blade knife was used to debride the skin and soft tissue back to healthy viable granulation tissue this was touched with silver nitrate a Band-Aid was applied.  The ulcer was 5 mm in diameter prior to debridement is now 15 mm in diameter and 2 mm deep.  Recommended knee-high compression socks for his venous insufficiency.  Imaging: No results found. No images are attached to the encounter.  Labs: Lab Results  Component Value Date   HGBA1C 5.9 11/01/2020   HGBA1C 5.4 04/04/2018   HGBA1C 5.6 03/23/2017   ESRSEDRATE 19 08/01/2018   LABURIC 5.0 11/28/2012   LABURIC 6.4 03/10/2011   LABURIC 6.8 02/12/2010     Lab Results  Component Value Date   ALBUMIN 4.3 11/01/2020   ALBUMIN 4.4 04/08/2020   ALBUMIN 4.2  04/06/2019    No results found for: MG Lab Results  Component Value Date   VD25OH 50.24 11/01/2020   VD25OH 20.78 (L) 04/06/2019    No results found for: PREALBUMIN CBC EXTENDED Latest Ref Rng & Units 04/08/2020 04/06/2019 04/04/2018  WBC 4.0 - 10.5 K/uL 6.1 5.9 6.3  RBC 4.22 - 5.81 Mil/uL 4.41 4.51 4.46  HGB 13.0 - 17.0 g/dL 12.9(L) 13.1 14.3  HCT 39.0 - 52.0 % 38.5(L) 40.1 42.7  PLT 150.0 - 400.0 K/uL 202.0 197.0 216.0  NEUTROABS 1.4 - 7.7 K/uL 4.0 3.8 4.3  LYMPHSABS 0.7 - 4.0 K/uL 1.5 1.4 1.4     There is no height or weight on file to calculate BMI.  Orders:  No orders of the defined types were placed in this encounter.  No orders of the defined types were placed in this encounter.    Procedures: No procedures performed  Clinical Data: No additional findings.  ROS:  All other systems negative, except as noted in the HPI. Review of Systems  Objective: Vital Signs: There were no vitals taken for this visit.  Specialty Comments:  No specialty comments available.  PMFS History: Patient Active Problem List   Diagnosis Date Noted   Tick bite, infected 08/07/2020   Irregular heart beats 04/08/2020   Peripheral neuropathy 04/06/2019   BPH  with obstruction/lower urinary tract symptoms 04/06/2019   Low grade fever 04/06/2019   Bilateral leg pain 04/04/2018   Bilateral bunions 04/04/2018   Ganglion cyst of dorsum of right wrist 04/04/2018   Chronic low back pain 04/04/2018   Gait disorder 04/04/2018   Recurrent falls while walking 04/04/2018   Abrasion of knee, bilateral 04/04/2018   Bladder cancer ([place]) 03/19/2016   Esophageal stricture 03/19/2016   Retinal tear of right eye 03/19/2016   Dizziness 03/19/2016   Hyperglycemia 03/16/2015   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   Prostate cancer ([place]) 04/13/2012   Hyperlipidemia with target LDL less than 130 04/12/2012   Encounter for well adult exam with abnormal findings 03/11/2011    TOBACCO ABUSE, HX OF 02/12/2010   SHOULDER PAIN 10/10/2007   Vitamin B12 deficiency 07/14/2007   PODAGRA 07/14/2007   ERECTILE DYSFUNCTION 07/14/2007   TREMOR, ESSENTIAL 46/50/3546   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   Essential hypertension 07/14/2007   PERIPHERAL VASCULAR DISEASE 07/14/2007   EXTERNAL HEMORRHOIDS 07/14/2007   ESOPHAGITIS 07/14/2007   GERD 07/14/2007   TONSILLECTOMY, HX OF 07/14/2007   Past Medical History:  Diagnosis Date   DUODENITIS, WITHOUT HEMORRHAGE 07/14/2007   ERECTILE DYSFUNCTION 07/14/2007   Esophageal stricture 03/19/2016   ESOPHAGITIS 07/14/2007   EXTERNAL HEMORRHOIDS 07/14/2007   GERD 07/14/2007   Gout    HYPERTENSION 07/14/2007   PERIPHERAL NEUROPATHY 07/14/2007   Peripheral neuropathy 04/06/2019   PERIPHERAL VASCULAR DISEASE 07/14/2007   PODAGRA 07/14/2007   Prostate cancer ([place]) 02/2012   pt had screening and then biopsy   Retinal tear of right eye 03/19/2016   TOBACCO ABUSE, HX OF 02/12/2010   TONSILLECTOMY, HX OF 07/14/2007   TREMOR, ESSENTIAL 07/14/2007   VITAMIN B12 DEFICIENCY 07/14/2007    Family History  Problem Relation Age of Onset   Hypothyroidism Daughter    Breast cancer Other    Cancer Mother    Other Father    Breast cancer Sister     Past Surgical History:  Procedure Laterality Date   Excision of lipoma     PROSTATE BIOPSY Bilateral 03/10/2012   surgery to reduce turbinate and straighten deviated septum  2016   TONSILLECTOMY     Social History   Occupational History   Occupation: retired    Fish farm manager: RETIRED  Tobacco Use   Smoking status: Former    Types: Cigarettes    Quit date: 02/23/1994    Years since quitting: 26.9   Smokeless tobacco: Former  Scientific laboratory technician Use: Never used  Substance and Sexual Activity   Alcohol use: Not Currently    Comment: d/c beer about 5 months ago   Drug use: No   Sexual activity: Yes    Partners: Female

## 2021-03-04 ENCOUNTER — Ambulatory Visit: Payer: Medicare Other | Admitting: Orthopedic Surgery

## 2021-03-04 DIAGNOSIS — L97511 Non-pressure chronic ulcer of other part of right foot limited to breakdown of skin: Secondary | ICD-10-CM

## 2021-03-07 ENCOUNTER — Encounter: Payer: Self-pay | Admitting: Orthopedic Surgery

## 2021-03-07 ENCOUNTER — Telehealth: Payer: Self-pay | Admitting: Orthopedic Surgery

## 2021-03-07 MED ORDER — ALLOPURINOL 100 MG PO TABS: 100.0000 mg | ORAL_TABLET | Freq: Every day | ORAL | 4 refills | Status: DC

## 2021-03-07 NOTE — Telephone Encounter (Signed)
Did you want to start this pt on allopurinol? In office 03/04/21 note not yet dictated

## 2021-03-07 NOTE — Telephone Encounter (Signed)
Patient's wife Jana Half called advised the Allopurinol is not showing received at the pharmacy. Patient uses the [facility] [address] in [place]. The number to contact Jana Half is [phone]

## 2021-03-07 NOTE — Progress Notes (Signed)
Office Visit Note   Patient: Stephen Meza           Date of Birth: 05/11/1933           MRN: [id number] Visit Date: 03/04/2021              Requested by: Biagio Borg, MD [address] [place],  [place] [address] PCP: Biagio Borg, MD  Chief Complaint  Patient presents with   Right Foot - Follow-up      HPI: Patient is an 88 year old gentleman who presents in follow-up for ulceration of the right foot as well as history of gout.  Patient is previous uric acids were in the low sixes and on allopurinol twice a day.  Assessment & Plan: Visit Diagnoses: No diagnosis found.  Plan: Ulcer was debrided.  Patient's uric acid was 5.0 will decrease his allopurinol to 100 mg a day.    Follow-Up Instructions: No follow-ups on file.   Ortho Exam  Patient is alert, oriented, no adenopathy, well-dressed, normal affect, normal respiratory effort. Examination patient has no cellulitis in the right foot.  The fifth metatarsal head ulcer is smaller with some periwound redness.  After informed consent a 10 blade knife was used to debride the skin and soft tissue back to healthy viable tissue.  After debridement the ulcer is 2 cm in diameter and 3 mm deep.  Silver nitrate was used for hemostasis.  There was good healthy granulation tissue no exposed bone or tendon.  Imaging: No results found. No images are attached to the encounter.  Labs: Lab Results  Component Value Date   HGBA1C 5.9 11/01/2020   HGBA1C 5.4 04/04/2018   HGBA1C 5.6 03/23/2017   ESRSEDRATE 19 08/01/2018   LABURIC 5.0 11/28/2012   LABURIC 6.4 03/10/2011   LABURIC 6.8 02/12/2010     Lab Results  Component Value Date   ALBUMIN 4.3 11/01/2020   ALBUMIN 4.4 04/08/2020   ALBUMIN 4.2 04/06/2019    No results found for: MG Lab Results  Component Value Date   VD25OH 50.24 11/01/2020   VD25OH 20.78 (L) 04/06/2019    No results found for: PREALBUMIN CBC EXTENDED Latest Ref Rng & Units 04/08/2020 04/06/2019  04/04/2018  WBC 4.0 - 10.5 K/uL 6.1 5.9 6.3  RBC 4.22 - 5.81 Mil/uL 4.41 4.51 4.46  HGB 13.0 - 17.0 g/dL 12.9(L) 13.1 14.3  HCT 39.0 - 52.0 % 38.5(L) 40.1 42.7  PLT 150.0 - 400.0 K/uL 202.0 197.0 216.0  NEUTROABS 1.4 - 7.7 K/uL 4.0 3.8 4.3  LYMPHSABS 0.7 - 4.0 K/uL 1.5 1.4 1.4     There is no height or weight on file to calculate BMI.  Orders:  No orders of the defined types were placed in this encounter.  No orders of the defined types were placed in this encounter.    Procedures: No procedures performed  Clinical Data: No additional findings.  ROS:  All other systems negative, except as noted in the HPI. Review of Systems  Objective: Vital Signs: There were no vitals taken for this visit.  Specialty Comments:  No specialty comments available.  PMFS History: Patient Active Problem List   Diagnosis Date Noted   Tick bite, infected 08/07/2020   Irregular heart beats 04/08/2020   Peripheral neuropathy 04/06/2019   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   Low grade fever 04/06/2019   Bilateral leg pain 04/04/2018   Bilateral bunions 04/04/2018   Ganglion cyst of dorsum of right wrist  04/04/2018   Chronic low back pain 04/04/2018   Gait disorder 04/04/2018   Recurrent falls while walking 04/04/2018   Abrasion of knee, bilateral 04/04/2018   Bladder cancer ([place]) 03/19/2016   Esophageal stricture 03/19/2016   Retinal tear of right eye 03/19/2016   Dizziness 03/19/2016   Hyperglycemia 03/16/2015   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   Prostate cancer ([place]) 04/13/2012   Hyperlipidemia with target LDL less than 130 04/12/2012   Encounter for well adult exam with abnormal findings 03/11/2011   TOBACCO ABUSE, HX OF 02/12/2010   SHOULDER PAIN 10/10/2007   Vitamin B12 deficiency 07/14/2007   PODAGRA 07/14/2007   ERECTILE DYSFUNCTION 07/14/2007   TREMOR, ESSENTIAL 38/33/3832   Alcoholic peripheral neuropathy ([place])  07/14/2007   Essential hypertension 07/14/2007   PERIPHERAL VASCULAR DISEASE 07/14/2007   EXTERNAL HEMORRHOIDS 07/14/2007   ESOPHAGITIS 07/14/2007   GERD 07/14/2007   TONSILLECTOMY, HX OF 07/14/2007   Past Medical History:  Diagnosis Date   DUODENITIS, WITHOUT HEMORRHAGE 07/14/2007   ERECTILE DYSFUNCTION 07/14/2007   Esophageal stricture 03/19/2016   ESOPHAGITIS 07/14/2007   EXTERNAL HEMORRHOIDS 07/14/2007   GERD 07/14/2007   Gout    HYPERTENSION 07/14/2007   PERIPHERAL NEUROPATHY 07/14/2007   Peripheral neuropathy 04/06/2019   PERIPHERAL VASCULAR DISEASE 07/14/2007   PODAGRA 07/14/2007   Prostate cancer ([place]) 02/2012   pt had screening and then biopsy   Retinal tear of right eye 03/19/2016   TOBACCO ABUSE, HX OF 02/12/2010   TONSILLECTOMY, HX OF 07/14/2007   TREMOR, ESSENTIAL 07/14/2007   VITAMIN B12 DEFICIENCY 07/14/2007    Family History  Problem Relation Age of Onset   Hypothyroidism Daughter    Breast cancer Other    Cancer Mother    Other Father    Breast cancer Sister     Past Surgical History:  Procedure Laterality Date   Excision of lipoma     PROSTATE BIOPSY Bilateral 03/10/2012   surgery to reduce turbinate and straighten deviated septum  2016   TONSILLECTOMY     Social History   Occupational History   Occupation: retired    Fish farm manager: RETIRED  Tobacco Use   Smoking status: Former    Types: Cigarettes    Quit date: 02/23/1994    Years since quitting: 27.0   Smokeless tobacco: Former  Scientific laboratory technician Use: Never used  Substance and Sexual Activity   Alcohol use: Not Currently    Comment: d/c beer about 5 months ago   Drug use: No   Sexual activity: Yes    Partners: Female

## 2021-03-07 NOTE — Telephone Encounter (Signed)
I called and sw pt's wife to advise that has been sent to pharm.

## 2021-04-01 ENCOUNTER — Ambulatory Visit: Payer: Medicare Other | Admitting: Orthopedic Surgery

## 2021-04-01 ENCOUNTER — Other Ambulatory Visit: Payer: Self-pay

## 2021-04-01 ENCOUNTER — Encounter: Payer: Self-pay | Admitting: Orthopedic Surgery

## 2021-04-01 DIAGNOSIS — B351 Tinea unguium: Secondary | ICD-10-CM

## 2021-04-01 DIAGNOSIS — L97511 Non-pressure chronic ulcer of other part of right foot limited to breakdown of skin: Secondary | ICD-10-CM

## 2021-04-01 NOTE — Progress Notes (Signed)
Office Visit Note   Patient: Stephen Meza           Date of Birth: September 29, 1933           MRN: [id number] Visit Date: 04/01/2021              Requested by: Biagio Borg, MD [address] [place],  [place] [address] PCP: Biagio Borg, MD  Chief Complaint  Patient presents with   Right Foot - Wound Check, Follow-up      HPI: Patient is an 88 year old gentleman who presents in follow-up for pressure ulcer beneath the fifth metatarsal head right foot he also complains of painful onychomycotic nails which he is unable to safely trim on his own.  Patient has had the medial column of his orthotic cut out which has unloaded the lateral aspect of his foot.  Assessment & Plan: Visit Diagnoses: No diagnosis found.  Plan: Continue with modified orthotics.  Achilles stretching.  Follow-Up Instructions: Return in about 4 weeks (around 04/29/2021).   Ortho Exam  Patient is alert, oriented, no adenopathy, well-dressed, normal affect, normal respiratory effort. Examination patient has a recurrent ulcer over the fifth metatarsal head of the right foot.  After informed consent a 10 blade knife was used to debride the skin and soft tissue back to healthy viable granulation tissue the ulcer is 10 mm in diameter prior to debridement 2 cm in diameter after debridement and 2 mm deep.  There is no exposed bone or tendon no deep abscess.  Patient has thickened discolored onychomycotic nails x10 he is unable to safely trim the nails on his own and the nails were trimmed E56 without complications.  Imaging: No results found. No images are attached to the encounter.  Labs: Lab Results  Component Value Date   HGBA1C 5.9 11/01/2020   HGBA1C 5.4 04/04/2018   HGBA1C 5.6 03/23/2017   ESRSEDRATE 19 08/01/2018   LABURIC 5.0 11/28/2012   LABURIC 6.4 03/10/2011   LABURIC 6.8 02/12/2010     Lab Results  Component Value Date   ALBUMIN 4.3 11/01/2020   ALBUMIN 4.4 04/08/2020   ALBUMIN 4.2  04/06/2019    No results found for: MG Lab Results  Component Value Date   VD25OH 50.24 11/01/2020   VD25OH 20.78 (L) 04/06/2019    No results found for: PREALBUMIN CBC EXTENDED Latest Ref Rng & Units 04/08/2020 04/06/2019 04/04/2018  WBC 4.0 - 10.5 K/uL 6.1 5.9 6.3  RBC 4.22 - 5.81 Mil/uL 4.41 4.51 4.46  HGB 13.0 - 17.0 g/dL 12.9(L) 13.1 14.3  HCT 39.0 - 52.0 % 38.5(L) 40.1 42.7  PLT 150.0 - 400.0 K/uL 202.0 197.0 216.0  NEUTROABS 1.4 - 7.7 K/uL 4.0 3.8 4.3  LYMPHSABS 0.7 - 4.0 K/uL 1.5 1.4 1.4     There is no height or weight on file to calculate BMI.  Orders:  No orders of the defined types were placed in this encounter.  No orders of the defined types were placed in this encounter.    Procedures: No procedures performed  Clinical Data: No additional findings.  ROS:  All other systems negative, except as noted in the HPI. Review of Systems  Objective: Vital Signs: There were no vitals taken for this visit.  Specialty Comments:  No specialty comments available.  PMFS History: Patient Active Problem List   Diagnosis Date Noted   Tick bite, infected 08/07/2020   Irregular heart beats 04/08/2020   Peripheral neuropathy 04/06/2019   BPH with  obstruction/lower urinary tract symptoms 04/06/2019   Low grade fever 04/06/2019   Bilateral leg pain 04/04/2018   Bilateral bunions 04/04/2018   Ganglion cyst of dorsum of right wrist 04/04/2018   Chronic low back pain 04/04/2018   Gait disorder 04/04/2018   Recurrent falls while walking 04/04/2018   Abrasion of knee, bilateral 04/04/2018   Bladder cancer ([place]) 03/19/2016   Esophageal stricture 03/19/2016   Retinal tear of right eye 03/19/2016   Dizziness 03/19/2016   Hyperglycemia 03/16/2015   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   Prostate cancer ([place]) 04/13/2012   Hyperlipidemia with target LDL less than 130 04/12/2012   Encounter for well adult exam with abnormal findings 03/11/2011    TOBACCO ABUSE, HX OF 02/12/2010   SHOULDER PAIN 10/10/2007   Vitamin B12 deficiency 07/14/2007   PODAGRA 07/14/2007   ERECTILE DYSFUNCTION 07/14/2007   TREMOR, ESSENTIAL 16/38/4536   Alcoholic peripheral neuropathy ([place]) 07/14/2007   Essential hypertension 07/14/2007   PERIPHERAL VASCULAR DISEASE 07/14/2007   EXTERNAL HEMORRHOIDS 07/14/2007   ESOPHAGITIS 07/14/2007   GERD 07/14/2007   TONSILLECTOMY, HX OF 07/14/2007   Past Medical History:  Diagnosis Date   DUODENITIS, WITHOUT HEMORRHAGE 07/14/2007   ERECTILE DYSFUNCTION 07/14/2007   Esophageal stricture 03/19/2016   ESOPHAGITIS 07/14/2007   EXTERNAL HEMORRHOIDS 07/14/2007   GERD 07/14/2007   Gout    HYPERTENSION 07/14/2007   PERIPHERAL NEUROPATHY 07/14/2007   Peripheral neuropathy 04/06/2019   PERIPHERAL VASCULAR DISEASE 07/14/2007   PODAGRA 07/14/2007   Prostate cancer ([place]) 02/2012   pt had screening and then biopsy   Retinal tear of right eye 03/19/2016   TOBACCO ABUSE, HX OF 02/12/2010   TONSILLECTOMY, HX OF 07/14/2007   TREMOR, ESSENTIAL 07/14/2007   VITAMIN B12 DEFICIENCY 07/14/2007    Family History  Problem Relation Age of Onset   Hypothyroidism Daughter    Breast cancer Other    Cancer Mother    Other Father    Breast cancer Sister     Past Surgical History:  Procedure Laterality Date   Excision of lipoma     PROSTATE BIOPSY Bilateral 03/10/2012   surgery to reduce turbinate and straighten deviated septum  2016   TONSILLECTOMY     Social History   Occupational History   Occupation: retired    Fish farm manager: RETIRED  Tobacco Use   Smoking status: Former    Types: Cigarettes    Quit date: 02/23/1994    Years since quitting: 27.1   Smokeless tobacco: Former  Scientific laboratory technician Use: Never used  Substance and Sexual Activity   Alcohol use: Not Currently    Comment: d/c beer about 5 months ago   Drug use: No   Sexual activity: Yes    Partners: Female

## 2021-04-22 ENCOUNTER — Other Ambulatory Visit (INDEPENDENT_AMBULATORY_CARE_PROVIDER_SITE_OTHER): Payer: Medicare Other

## 2021-04-22 DIAGNOSIS — R739 Hyperglycemia, unspecified: Secondary | ICD-10-CM | POA: Diagnosis not present

## 2021-04-22 DIAGNOSIS — E785 Hyperlipidemia, unspecified: Secondary | ICD-10-CM

## 2021-04-22 DIAGNOSIS — E538 Deficiency of other specified B group vitamins: Secondary | ICD-10-CM

## 2021-04-22 DIAGNOSIS — E559 Vitamin D deficiency, unspecified: Secondary | ICD-10-CM | POA: Diagnosis not present

## 2021-04-22 LAB — CBC WITH DIFFERENTIAL/PLATELET
Basophils Absolute: 0 10*3/uL (ref 0.0–0.1)
Basophils Relative: 1.1 % (ref 0.0–3.0)
Eosinophils Absolute: 0.1 10*3/uL (ref 0.0–0.7)
Eosinophils Relative: 1.8 % (ref 0.0–5.0)
HCT: 36.6 % — ABNORMAL LOW (ref 39.0–52.0)
Hemoglobin: 12.1 g/dL — ABNORMAL LOW (ref 13.0–17.0)
Lymphocytes Relative: 28.2 % (ref 12.0–46.0)
Lymphs Abs: 1.2 10*3/uL (ref 0.7–4.0)
MCHC: 33.1 g/dL (ref 30.0–36.0)
MCV: 87.1 fl (ref 78.0–100.0)
Monocytes Absolute: 0.4 10*3/uL (ref 0.1–1.0)
Monocytes Relative: 9.3 % (ref 3.0–12.0)
Neutro Abs: 2.6 10*3/uL (ref 1.4–7.7)
Neutrophils Relative %: 59.6 % (ref 43.0–77.0)
Platelets: 185 10*3/uL (ref 150.0–400.0)
RBC: 4.21 Mil/uL — ABNORMAL LOW (ref 4.22–5.81)
RDW: 15.1 % (ref 11.5–15.5)
WBC: 4.4 10*3/uL (ref 4.0–10.5)

## 2021-04-22 LAB — HEPATIC FUNCTION PANEL
ALT: 7 U/L (ref 0–53)
AST: 14 U/L (ref 0–37)
Albumin: 4.2 g/dL (ref 3.5–5.2)
Alkaline Phosphatase: 86 U/L (ref 39–117)
Bilirubin, Direct: 0 mg/dL (ref 0.0–0.3)
Total Bilirubin: 0.6 mg/dL (ref 0.2–1.2)
Total Protein: 7.2 g/dL (ref 6.0–8.3)

## 2021-04-22 LAB — LIPID PANEL
Cholesterol: 202 mg/dL — ABNORMAL HIGH (ref 0–200)
HDL: 44.6 mg/dL (ref >39.00)
LDL Cholesterol: 135 mg/dL — ABNORMAL HIGH (ref 0–99)
NonHDL: 157.86
Total CHOL/HDL Ratio: 5
Triglycerides: 113 mg/dL (ref 0.0–149.0)
VLDL: 22.6 mg/dL (ref 0.0–40.0)

## 2021-04-22 LAB — BASIC METABOLIC PANEL
BUN: 19 mg/dL (ref 6–23)
CO2: 28 mEq/L (ref 19–32)
Calcium: 9.5 mg/dL (ref 8.4–10.5)
Chloride: 102 mEq/L (ref 96–112)
Creatinine, Ser: 1.19 mg/dL (ref 0.40–1.50)
GFR: 55.57 mL/min — ABNORMAL LOW (ref >60.00)
Glucose, Bld: 102 mg/dL — ABNORMAL HIGH (ref 70–99)
Potassium: 4.6 mEq/L (ref 3.5–5.1)
Sodium: 138 mEq/L (ref 135–145)

## 2021-04-22 LAB — VITAMIN B12: Vitamin B-12: 457 pg/mL (ref 211–911)

## 2021-04-22 LAB — TSH: TSH: 4.23 u[IU]/mL (ref 0.35–5.50)

## 2021-04-22 LAB — VITAMIN D 25 HYDROXY (VIT D DEFICIENCY, FRACTURES): VITD: 47.18 ng/mL (ref 30.00–100.00)

## 2021-04-22 LAB — HEMOGLOBIN A1C: Hgb A1c MFr Bld: 5.9 % (ref 4.6–6.5)

## 2021-04-29 ENCOUNTER — Ambulatory Visit: Payer: Medicare Other | Admitting: Orthopedic Surgery

## 2021-04-30 ENCOUNTER — Ambulatory Visit: Payer: Medicare Other | Admitting: Family

## 2021-04-30 DIAGNOSIS — D1721 Benign lipomatous neoplasm of skin and subcutaneous tissue of right arm: Secondary | ICD-10-CM | POA: Diagnosis not present

## 2021-04-30 DIAGNOSIS — D692 Other nonthrombocytopenic purpura: Secondary | ICD-10-CM | POA: Diagnosis not present

## 2021-04-30 DIAGNOSIS — L821 Other seborrheic keratosis: Secondary | ICD-10-CM | POA: Diagnosis not present

## 2021-04-30 DIAGNOSIS — B351 Tinea unguium: Secondary | ICD-10-CM | POA: Diagnosis not present

## 2021-04-30 DIAGNOSIS — Z85828 Personal history of other malignant neoplasm of skin: Secondary | ICD-10-CM | POA: Diagnosis not present

## 2021-04-30 DIAGNOSIS — D1801 Hemangioma of skin and subcutaneous tissue: Secondary | ICD-10-CM | POA: Diagnosis not present

## 2021-04-30 DIAGNOSIS — B353 Tinea pedis: Secondary | ICD-10-CM | POA: Diagnosis not present

## 2021-04-30 DIAGNOSIS — L570 Actinic keratosis: Secondary | ICD-10-CM | POA: Diagnosis not present

## 2021-04-30 DIAGNOSIS — D225 Melanocytic nevi of trunk: Secondary | ICD-10-CM | POA: Diagnosis not present

## 2021-05-01 LAB — URINALYSIS, ROUTINE W REFLEX MICROSCOPIC
Bilirubin Urine: NEGATIVE
Hgb urine dipstick: NEGATIVE
Ketones, ur: NEGATIVE
Leukocytes,Ua: NEGATIVE
Nitrite: NEGATIVE
Specific Gravity, Urine: 1.02 (ref 1.000–1.030)
Total Protein, Urine: NEGATIVE
Urine Glucose: NEGATIVE
Urobilinogen, UA: 0.2 (ref 0.0–1.0)
pH: 5.5 (ref 5.0–8.0)

## 2021-05-02 ENCOUNTER — Ambulatory Visit (INDEPENDENT_AMBULATORY_CARE_PROVIDER_SITE_OTHER): Payer: Medicare Other | Admitting: Internal Medicine

## 2021-05-02 ENCOUNTER — Encounter: Payer: Self-pay | Admitting: Internal Medicine

## 2021-05-02 ENCOUNTER — Other Ambulatory Visit: Payer: Self-pay

## 2021-05-02 VITALS — BP 136/80 | HR 72 | Temp 99.2°F | Ht 72.0 in | Wt 183.0 lb

## 2021-05-02 DIAGNOSIS — Z0001 Encounter for general adult medical examination with abnormal findings: Secondary | ICD-10-CM

## 2021-05-02 DIAGNOSIS — I10 Essential (primary) hypertension: Secondary | ICD-10-CM

## 2021-05-02 DIAGNOSIS — E538 Deficiency of other specified B group vitamins: Secondary | ICD-10-CM | POA: Diagnosis not present

## 2021-05-02 DIAGNOSIS — E785 Hyperlipidemia, unspecified: Secondary | ICD-10-CM | POA: Diagnosis not present

## 2021-05-02 DIAGNOSIS — R739 Hyperglycemia, unspecified: Secondary | ICD-10-CM

## 2021-05-02 DIAGNOSIS — D649 Anemia, unspecified: Secondary | ICD-10-CM | POA: Diagnosis not present

## 2021-05-02 DIAGNOSIS — Z0000 Encounter for general adult medical examination without abnormal findings: Secondary | ICD-10-CM

## 2021-05-02 LAB — IBC PANEL
Iron: 49 ug/dL (ref 42–165)
Saturation Ratios: 15.7 % — ABNORMAL LOW (ref 20.0–50.0)
TIBC: 312.2 ug/dL (ref 250.0–450.0)
Transferrin: 223 mg/dL (ref 212.0–360.0)

## 2021-05-02 LAB — FERRITIN: Ferritin: 33.7 ng/mL (ref 22.0–322.0)

## 2021-05-02 MED ORDER — EZETIMIBE 10 MG PO TABS: 10.0000 mg | ORAL_TABLET | Freq: Every day | ORAL | 3 refills | Status: DC

## 2021-05-02 NOTE — Progress Notes (Signed)
Patient ID: Stephen Meza, male   DOB: 18-Feb-1934, 88 y.o.   MRN: [id number] ? ? ? ?     Chief Complaint:: wellness exam and Follow-up ? Anemia, htn, hyperglyemia, hld, b12 deficiency ? ?     HPI:  Stephen Meza is a 88 y.o. male here for wellness exam; declines covid booster, o/w up to date ?         ?              Also sees Dr Sharol Given for ortho and right foot callous. Also saw derm mar 8 with a lesion removed scalp.  Pt denies chest pain, increased sob or doe, wheezing, orthopnea, PND, increased LE swelling, palpitations, dizziness or syncope.   Pt denies polydipsia, polyuria, or new focal neuro s/s.    Pt denies fever, wt loss, night sweats, loss of appetite, or other constitutional symptoms  No overt bleeding.  Taking B12.   ?  ?Wt Readings from Last 3 Encounters:  ?05/02/21 183 lb (83 kg)  ?11/01/20 183 lb (83 kg)  ?08/07/20 180 lb 6.4 oz (81.8 kg)  ? ?BP Readings from Last 3 Encounters:  ?05/02/21 136/80  ?11/01/20 140/80  ?08/07/20 130/80  ? ?Immunization History  ?Administered Date(s) Administered  ? Fluad Quad(high Dose 65+) 10/27/2018, 11/27/2019, 11/01/2020  ? Influenza Whole 12/23/2006, 12/17/2008, 11/27/2011  ? Influenza, High Dose Seasonal PF 12/16/2017, 10/31/2018  ? Influenza-Unspecified 12/08/2014, 11/23/2016  ? PFIZER(Purple Top)SARS-COV-2 Vaccination 03/11/2019, 04/01/2019, 12/08/2019, 09/25/2020  ? Pneumococcal Conjugate-13 05/01/2013  ? Pneumococcal Polysaccharide-23 12/23/2006  ? Td 10/23/2008  ? Tdap 04/06/2019  ? Zoster Recombinat (Shingrix) 08/03/2017, 10/09/2017  ? Zoster, Live 12/23/2006  ? ?There are no preventive care reminders to display for this patient. ? ?  ? ?Past Medical History:  ?Diagnosis Date  ? DUODENITIS, WITHOUT HEMORRHAGE 07/14/2007  ? ERECTILE DYSFUNCTION 07/14/2007  ? Esophageal stricture 03/19/2016  ? ESOPHAGITIS 07/14/2007  ? EXTERNAL HEMORRHOIDS 07/14/2007  ? GERD 07/14/2007  ? Gout   ? HYPERTENSION 07/14/2007  ? PERIPHERAL NEUROPATHY 07/14/2007  ? Peripheral  neuropathy 04/06/2019  ? PERIPHERAL VASCULAR DISEASE 07/14/2007  ? PODAGRA 07/14/2007  ? Prostate cancer ([place]) 02/2012  ? pt had screening and then biopsy  ? Retinal tear of right eye 03/19/2016  ? TOBACCO ABUSE, HX OF 02/12/2010  ? TONSILLECTOMY, HX OF 07/14/2007  ? TREMOR, ESSENTIAL 07/14/2007  ? VITAMIN B12 DEFICIENCY 07/14/2007  ? ?Past Surgical History:  ?Procedure Laterality Date  ? Excision of lipoma    ? PROSTATE BIOPSY Bilateral 03/10/2012  ? surgery to reduce turbinate and straighten deviated septum  2016  ? TONSILLECTOMY    ? ? reports that he quit smoking about 27 years ago. His smoking use included cigarettes. He has quit using smokeless tobacco. He reports that he does not currently use alcohol. He reports that he does not use drugs. ?family history includes Breast cancer in his sister and another family member; Cancer in his mother; Hypothyroidism in his daughter; Other in his father. ?Allergies  ?Allergen Reactions  ? Nitrofuran Derivatives Rash  ? Ciprofloxacin Hcl Other (See Comments)  ?  numbness  ? ?Current Outpatient Medications on File Prior to Visit  ?Medication Sig Dispense Refill  ? allopurinol (ZYLOPRIM) 100 MG tablet Take 1 tablet (100 mg total) by mouth daily. 90 tablet 4  ? amLODipine (NORVASC) 2.5 MG tablet TAKE 1 TABLET BY MOUTH EVERY DAY 90 tablet 1  ? Cyanocobalamin (B-12) 3000 MCG CAPS Take by mouth daily.    ?  docusate sodium (COLACE) 100 MG capsule Take 100 mg by mouth once.    ? gabapentin (NEURONTIN) 100 MG capsule Take 2 capsules (200 mg total) by mouth 3 (three) times daily. 540 capsule 1  ? omeprazole (PRILOSEC) 40 MG capsule TAKE 1 CAPSULE BY MOUTH EVERY DAY 90 capsule 2  ? atorvastatin (LIPITOR) 20 MG tablet Take 1 tablet (20 mg total) by mouth daily. (Patient not taking: Reported on 05/02/2021) 90 tablet 3  ? ?No current facility-administered medications on file prior to visit.  ? ?     ROS:  All others reviewed and negative. ? ?Objective  ? ?     PE:  BP 136/80 (BP Location: Right  Arm, Patient Position: Sitting, Cuff Size: Large)   Pulse 72   Temp 99.2 ?F (37.3 ?C) (Oral)   Ht 6' (1.829 m)   Wt 183 lb (83 kg)   SpO2 (!) 72%   BMI 24.82 kg/m?  ? ?              Constitutional: Pt appears in NAD ?              HENT: Head: NCAT.  ?              Right Ear: External ear normal.   ?              Left Ear: External ear normal.  ?              Eyes: . Pupils are equal, round, and reactive to light. Conjunctivae and EOM are normal ?              Nose: without d/c or deformity ?              Neck: Neck supple. Gross normal ROM ?              Cardiovascular: Normal rate and regular rhythm.   ?              Pulmonary/Chest: Effort normal and breath sounds without rales or wheezing.  ?              Abd:  Soft, NT, ND, + BS, no organomegaly ?              Neurological: Pt is alert. At baseline orientation, motor grossly intact ?              Skin: Skin is warm. No rashes, no other new lesions, LE edema - none ?              Psychiatric: Pt behavior is normal without agitation  ? ?Micro: none ? ?Cardiac tracings I have personally interpreted today:  none ? ?Pertinent Radiological findings (summarize): none  ? ?Lab Results  ?Component Value Date  ? WBC 4.4 04/22/2021  ? HGB 12.1 (L) 04/22/2021  ? HCT 36.6 (L) 04/22/2021  ? PLT 185.0 04/22/2021  ? GLUCOSE 102 (H) 04/22/2021  ? CHOL 202 (H) 04/22/2021  ? TRIG 113.0 04/22/2021  ? HDL 44.60 04/22/2021  ? LDLDIRECT 93.0 03/15/2014  ? LDLCALC 135 (H) 04/22/2021  ? ALT 7 04/22/2021  ? AST 14 04/22/2021  ? NA 138 04/22/2021  ? K 4.6 04/22/2021  ? CL 102 04/22/2021  ? CREATININE 1.19 04/22/2021  ? BUN 19 04/22/2021  ? CO2 28 04/22/2021  ? TSH 4.23 04/22/2021  ? PSA 3.54 02/12/2010  ? HGBA1C 5.9 04/22/2021  ? ?Assessment/Plan:  ?Stephen Meza is a  89 y.o. White or Caucasian [1] male with  has a past medical history of DUODENITIS, WITHOUT HEMORRHAGE (07/14/2007), ERECTILE DYSFUNCTION (07/14/2007), Esophageal stricture (03/19/2016), ESOPHAGITIS (07/14/2007),  EXTERNAL HEMORRHOIDS (07/14/2007), GERD (07/14/2007), Gout, HYPERTENSION (07/14/2007), PERIPHERAL NEUROPATHY (07/14/2007), Peripheral neuropathy (04/06/2019), PERIPHERAL VASCULAR DISEASE (07/14/2007), PODAGRA (07/14/2007), Prostate cancer ([place]) (02/2012), Retinal tear of right eye (03/19/2016), TOBACCO ABUSE, HX OF (02/12/2010), TONSILLECTOMY, HX OF (07/14/2007), TREMOR, ESSENTIAL (07/14/2007), and VITAMIN B12 DEFICIENCY (07/14/2007). ? ?Anemia ?Lab Results  ?Component Value Date  ? WBC 4.4 04/22/2021  ? HGB 12.1 (L) 04/22/2021  ? HCT 36.6 (L) 04/22/2021  ? MCV 87.1 04/22/2021  ? PLT 185.0 04/22/2021  ?mild worsening trend last 2-3 yrs, for f/u iron lab f/u ? ?Vitamin B12 deficiency ?Lab Results  ?Component Value Date  ? [id number] 457 04/22/2021  ? ?Stable, cont oral replacement - b12 1000 mcg qd ? ? ?Encounter for well adult exam with abnormal findings ?Age and sex appropriate education and counseling updated with regular exercise and diet ?Referrals for preventative services - none needed ?Immunizations addressed - declines covid booster ?Smoking counseling  - none needed ?Evidence for depression or other mood disorder - none significant ?Most recent labs reviewed. ?I have personally reviewed and have noted: ?1) the patient's medical and social history ?2) The patient's current medications and supplements ?3) The patient's height, weight, and BMI have been recorded in the chart ? ? ?Hyperlipidemia with target LDL less than 130 ?Lab Results  ?Component Value Date  ? LDLCALC 135 (H) 04/22/2021  ? ?Uncontrolled, goal ldl < 100, pt to start zetia 10 qd ? ? ?Hyperglycemia ?Lab Results  ?Component Value Date  ? HGBA1C 5.9 04/22/2021  ? ?Stable, pt to continue current medical treatment  - diet ? ? ?Essential hypertension ?BP Readings from Last 3 Encounters:  ?05/02/21 136/80  ?11/01/20 140/80  ?08/07/20 130/80  ? ?Stable, pt to continue medical treatment norvasc ? ?Followup: Return in about 6 months (around 11/02/2021). ? ?Cathlean Cower, MD 05/03/2021 2:21 PM ?[place] ?[place] ?Internal Medicine ?

## 2021-05-02 NOTE — Patient Instructions (Addendum)
Please take all new medication as prescribed  - the zetia 10 mg per day for cholesterol ? ?Please continue all other medications as before, and refills have been done if requested. ? ?Please have the pharmacy call with any other refills you may need. ? ?Please continue your efforts at being more active, low cholesterol diet, and weight control. ? ?You are otherwise up to date with prevention measures today. ? ?Please keep your appointments with your specialists as you may have planned ? ?Please go to the LAB at the blood drawing area for the tests to be done - just the iron testing today ? ?You will be contacted by phone if any changes need to be made immediately.  Otherwise, you will receive a letter about your results with an explanation, but please check with MyChart first. ? ?Please remember to sign up for MyChart if you have not done so, as this will be important to you in the future with finding out test results, communicating by private email, and scheduling acute appointments online when needed. ? ?Please make an Appointment to return in 6 months, or sooner if needed ?

## 2021-05-03 ENCOUNTER — Encounter: Payer: Self-pay | Admitting: Internal Medicine

## 2021-05-03 DIAGNOSIS — D649 Anemia, unspecified: Secondary | ICD-10-CM | POA: Insufficient documentation

## 2021-05-03 NOTE — Assessment & Plan Note (Signed)
Lab Results  ?Component Value Date  ? [id number] 457 04/22/2021  ? ?Stable, cont oral replacement - b12 1000 mcg qd ? ?

## 2021-05-03 NOTE — Assessment & Plan Note (Signed)

## 2021-05-03 NOTE — Assessment & Plan Note (Signed)
Lab Results  ?Component Value Date  ? HGBA1C 5.9 04/22/2021  ? ?Stable, pt to continue current medical treatment  - diet ? ?

## 2021-05-03 NOTE — Assessment & Plan Note (Signed)
Lab Results  ?Component Value Date  ? LDLCALC 135 (H) 04/22/2021  ? ?Uncontrolled, goal ldl < 100, pt to start zetia 10 qd ? ?

## 2021-05-03 NOTE — Assessment & Plan Note (Signed)
Lab Results  ?Component Value Date  ? WBC 4.4 04/22/2021  ? HGB 12.1 (L) 04/22/2021  ? HCT 36.6 (L) 04/22/2021  ? MCV 87.1 04/22/2021  ? PLT 185.0 04/22/2021  ?mild worsening trend last 2-3 yrs, for f/u iron lab f/u ?

## 2021-05-03 NOTE — Assessment & Plan Note (Signed)
BP Readings from Last 3 Encounters:  ?05/02/21 136/80  ?11/01/20 140/80  ?08/07/20 130/80  ? ?Stable, pt to continue medical treatment norvasc ? ?

## 2021-05-05 ENCOUNTER — Ambulatory Visit: Payer: Medicare Other | Admitting: Orthopedic Surgery

## 2021-05-05 DIAGNOSIS — L97511 Non-pressure chronic ulcer of other part of right foot limited to breakdown of skin: Secondary | ICD-10-CM | POA: Diagnosis not present

## 2021-05-05 DIAGNOSIS — Q6671 Congenital pes cavus, right foot: Secondary | ICD-10-CM

## 2021-05-06 ENCOUNTER — Encounter: Payer: Self-pay | Admitting: Orthopedic Surgery

## 2021-05-06 NOTE — Progress Notes (Signed)
? ?Office Visit Note ?  ?Patient: Stephen Meza           ?Date of Birth: [birth year]           ?MRN: [id number] ?Visit Date: 05/05/2021 ?             ?Requested by: Biagio Borg, MD ?[place],  [place] [address] ?PCP: Biagio Borg, MD ? ?Chief Complaint  ?Patient presents with  ? Right Foot - Follow-up  ? ? ? ? ?HPI: ?Patient is an 88 year old gentleman who is seen in follow-up for Wagner grade 1 ulcer beneath the fifth metatarsal head of the right foot.  Patient has been doing Achilles stretching he has cut out a pressure relieving area from his orthotic beneath the fifth metatarsal head. ? ?Assessment & Plan: ?Visit Diagnoses:  ?1. Non-pressure chronic ulcer of other part of right foot limited to breakdown of skin ([place])   ?2. Cavus deformity of right foot   ? ? ?Plan: Recommended sole orthotics and cutting out beneath the first metatarsal head to decrease the varus to the forefoot to allow pressure offloading over the fifth metatarsal head. ? ?Follow-Up Instructions: Return in about 4 weeks (around 06/02/2021).  ? ?Ortho Exam ? ?Patient is alert, oriented, no adenopathy, well-dressed, normal affect, normal respiratory effort. ?Examination patient has good pulses he has a cavus foot with a plantarflexed first ray and when his foot is plantigrade this rotates his hindfoot and forefoot into varus and overloads the fifth metatarsal head.  He has a Wagner grade 1 ulcer beneath the fifth metatarsal head.  This is 1 cm diameter.  After informed consent a 10 blade knife was used to debride the skin and soft tissue back to healthy viable tissue this is 2 cm in diameter and 1 mm deep after debridement there is no cellulitis no abscess no signs of infection. ? ?Imaging: ?No results found. ?No images are attached to the encounter. ? ?Labs: ?Lab Results  ?Component Value Date  ? HGBA1C 5.9 04/22/2021  ? HGBA1C 5.9 11/01/2020  ? HGBA1C 5.4 04/04/2018  ? ESRSEDRATE 19 08/01/2018  ? LABURIC 5.0 11/28/2012  ?  LABURIC 6.4 03/10/2011  ? LABURIC 6.8 02/12/2010  ? ? ? ?Lab Results  ?Component Value Date  ? ALBUMIN 4.2 04/22/2021  ? ALBUMIN 4.3 11/01/2020  ? ALBUMIN 4.4 04/08/2020  ? ? ?No results found for: MG ?Lab Results  ?Component Value Date  ? VD25OH 47.18 04/22/2021  ? VD25OH 50.24 11/01/2020  ? VD25OH 20.78 (L) 04/06/2019  ? ? ?No results found for: PREALBUMIN ?CBC EXTENDED Latest Ref Rng & Units 04/22/2021 04/08/2020 04/06/2019  ?WBC 4.0 - 10.5 K/uL 4.4 6.1 5.9  ?RBC 4.22 - 5.81 Mil/uL 4.21(L) 4.41 4.51  ?HGB 13.0 - 17.0 g/dL 12.1(L) 12.9(L) 13.1  ?HCT 39.0 - 52.0 % 36.6(L) 38.5(L) 40.1  ?PLT 150.0 - 400.0 K/uL 185.0 202.0 197.0  ?NEUTROABS 1.4 - 7.7 K/uL 2.6 4.0 3.8  ?LYMPHSABS 0.7 - 4.0 K/uL 1.2 1.5 1.4  ? ? ? ?There is no height or weight on file to calculate BMI. ? ?Orders:  ?No orders of the defined types were placed in this encounter. ? ?No orders of the defined types were placed in this encounter. ? ? ? Procedures: ?No procedures performed ? ?Clinical Data: ?No additional findings. ? ?ROS: ? ?All other systems negative, except as noted in the HPI. ?Review of Systems ? ?Objective: ?Vital Signs: There were no vitals taken for this visit. ? ?  Specialty Comments:  ?No specialty comments available. ? ?PMFS History: ?Patient Active Problem List  ? Diagnosis Date Noted  ? Anemia 05/03/2021  ? Tick bite, infected 08/07/2020  ? Irregular heart beats 04/08/2020  ? Peripheral neuropathy 04/06/2019  ? 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 Low grade fever 04/06/2019  ? Bilateral leg pain 04/04/2018  ? Bilateral bunions 04/04/2018  ? Ganglion cyst of dorsum of right wrist 04/04/2018  ? Chronic low back pain 04/04/2018  ? Gait disorder 04/04/2018  ? Recurrent falls while walking 04/04/2018  ? Abrasion of knee, bilateral 04/04/2018  ? Bladder cancer ([place]) 03/19/2016  ? Esophageal stricture 03/19/2016  ? Retinal tear of right eye 03/19/2016  ? Dizziness 03/19/2016  ? Hyperglycemia 03/16/2015  ? 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 Prostate cancer ([place]) 04/13/2012  ? Hyperlipidemia with target LDL less than 130 04/12/2012  ? Encounter for well adult exam with abnormal findings 03/11/2011  ? TOBACCO ABUSE, HX OF 02/12/2010  ? SHOULDER PAIN 10/10/2007  ? Vitamin B12 deficiency 07/14/2007  ? PODAGRA 07/14/2007  ? ERECTILE DYSFUNCTION 07/14/2007  ? TREMOR, ESSENTIAL 07/14/2007  ? 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 Essential hypertension 07/14/2007  ? PERIPHERAL VASCULAR DISEASE 07/14/2007  ? EXTERNAL HEMORRHOIDS 07/14/2007  ? ESOPHAGITIS 07/14/2007  ? GERD 07/14/2007  ? TONSILLECTOMY, HX OF 07/14/2007  ? ?Past Medical History:  ?Diagnosis Date  ? DUODENITIS, WITHOUT HEMORRHAGE 07/14/2007  ? ERECTILE DYSFUNCTION 07/14/2007  ? Esophageal stricture 03/19/2016  ? ESOPHAGITIS 07/14/2007  ? EXTERNAL HEMORRHOIDS 07/14/2007  ? GERD 07/14/2007  ? Gout   ? HYPERTENSION 07/14/2007  ? PERIPHERAL NEUROPATHY 07/14/2007  ? Peripheral neuropathy 04/06/2019  ? PERIPHERAL VASCULAR DISEASE 07/14/2007  ? PODAGRA 07/14/2007  ? Prostate cancer ([place]) 02/2012  ? pt had screening and then biopsy  ? Retinal tear of right eye 03/19/2016  ? TOBACCO ABUSE, HX OF 02/12/2010  ? TONSILLECTOMY, HX OF 07/14/2007  ? TREMOR, ESSENTIAL 07/14/2007  ? VITAMIN B12 DEFICIENCY 07/14/2007  ?  ?Family History  ?Problem Relation Age of Onset  ? Hypothyroidism Daughter   ? Breast cancer Other   ? Cancer Mother   ? Other Father   ? Breast cancer Sister   ?  ?Past Surgical History:  ?Procedure Laterality Date  ? Excision of lipoma    ? PROSTATE BIOPSY Bilateral 03/10/2012  ? surgery to reduce turbinate and straighten deviated septum  2016  ? TONSILLECTOMY    ? ?Social History  ? ?Occupational History  ? Occupation: retired  ?  Employer: RETIRED  ?Tobacco Use  ? Smoking status: Former  ?  Types: Cigarettes  ?  Quit date: 02/23/1994  ?  Years since quitting: 27.2  ? Smokeless tobacco: Former  ?Vaping Use  ? Vaping Use: Never used  ?Substance  and Sexual Activity  ? Alcohol use: Not Currently  ?  Comment: d/c beer about 5 months ago  ? Drug use: No  ? Sexual activity: Yes  ?  Partners: Female  ? ? ? ? ? ?

## 2021-06-09 ENCOUNTER — Ambulatory Visit: Payer: Medicare Other | Admitting: Orthopedic Surgery

## 2021-06-09 DIAGNOSIS — L97511 Non-pressure chronic ulcer of other part of right foot limited to breakdown of skin: Secondary | ICD-10-CM

## 2021-06-09 MED ORDER — GABAPENTIN 300 MG PO CAPS: 300.0000 mg | ORAL_CAPSULE | Freq: Three times a day (TID) | ORAL | 3 refills | Status: DC

## 2021-06-10 DIAGNOSIS — H1013 Acute atopic conjunctivitis, bilateral: Secondary | ICD-10-CM | POA: Diagnosis not present

## 2021-06-10 DIAGNOSIS — H338 Other retinal detachments: Secondary | ICD-10-CM | POA: Diagnosis not present

## 2021-06-10 DIAGNOSIS — H47291 Other optic atrophy, right eye: Secondary | ICD-10-CM | POA: Diagnosis not present

## 2021-06-10 DIAGNOSIS — H353131 Nonexudative age-related macular degeneration, bilateral, early dry stage: Secondary | ICD-10-CM | POA: Diagnosis not present

## 2021-06-17 ENCOUNTER — Other Ambulatory Visit: Payer: Self-pay | Admitting: Internal Medicine

## 2021-06-17 NOTE — Telephone Encounter (Signed)
Please refill as per office routine med refill policy (all routine meds to be refilled for 3 mo or monthly (per pt preference) up to one year from last visit, then month to month grace period for 3 mo, then further med refills will have to be denied) ? ?

## 2021-06-22 ENCOUNTER — Encounter: Payer: Self-pay | Admitting: Orthopedic Surgery

## 2021-06-22 NOTE — Progress Notes (Signed)
? ?Office Visit Note ?  ?Patient: Stephen Meza           ?Date of Birth: 1933-05-26           ?MRN: [id number] ?Visit Date: 06/09/2021 ?             ?Requested by: Biagio Borg, MD ?[place],  [place] [address] ?PCP: Biagio Borg, MD ? ?Chief Complaint  ?Patient presents with  ? Right Foot - Wound Check  ? ? ? ? ?HPI: ?Patient is a 88 year old gentleman who presents in follow-up for ulcer beneath the fifth metatarsal head of the right foot he is in sole orthotics. ? ?Assessment & Plan: ?Visit Diagnoses:  ?1. Non-pressure chronic ulcer of other part of right foot limited to breakdown of skin ([place])   ? ? ?Plan: A felt relieving donut was placed to unload the fifth metatarsal ulcer and a relief was carved out of the orthotic as well.  Patient was provided a prescription for Neurontin 300 mg 3 times a day. ? ?Follow-Up Instructions: Return in about 4 weeks (around 07/07/2021).  ? ?Ortho Exam ? ?Patient is alert, oriented, no adenopathy, well-dressed, normal affect, normal respiratory effort. ?Examination patient complains of neuropathy pain he is currently on Neurontin 100 mg twice a day.  He has a strong palpable dorsalis pedis pulse.  He has a Wagner grade 1 ulcer beneath the fifth metatarsal head.  After informed consent a 10 blade knife was used to debride the skin and soft tissue back to healthy viable tissue.  The ulcer was 1 cm diameter prior to debridement 2 cm after debridement 1 mm deep with healthy granulation tissue no exposed bone or tendon. ? ?Imaging: ?No results found. ?No images are attached to the encounter. ? ?Labs: ?Lab Results  ?Component Value Date  ? HGBA1C 5.9 04/22/2021  ? HGBA1C 5.9 11/01/2020  ? HGBA1C 5.4 04/04/2018  ? ESRSEDRATE 19 08/01/2018  ? LABURIC 5.0 11/28/2012  ? LABURIC 6.4 03/10/2011  ? LABURIC 6.8 02/12/2010  ? ? ? ?Lab Results  ?Component Value Date  ? ALBUMIN 4.2 04/22/2021  ? ALBUMIN 4.3 11/01/2020  ? ALBUMIN 4.4 04/08/2020  ? ? ?No results found for:  MG ?Lab Results  ?Component Value Date  ? VD25OH 47.18 04/22/2021  ? VD25OH 50.24 11/01/2020  ? VD25OH 20.78 (L) 04/06/2019  ? ? ?No results found for: PREALBUMIN ? ?  Latest Ref Rng & Units 04/22/2021  ? 12:10 PM 04/08/2020  ?  2:56 PM 04/06/2019  ?  3:20 PM  ?CBC EXTENDED  ?WBC 4.0 - 10.5 K/uL 4.4   6.1   5.9    ?RBC 4.22 - 5.81 Mil/uL 4.21   4.41   4.51    ?Hemoglobin 13.0 - 17.0 g/dL 12.1   12.9   13.1    ?HCT 39.0 - 52.0 % 36.6   38.5   40.1    ?Platelets 150.0 - 400.0 K/uL 185.0   202.0   197.0    ?NEUT# 1.4 - 7.7 K/uL 2.6   4.0   3.8    ?Lymph# 0.7 - 4.0 K/uL 1.2   1.5   1.4    ? ? ? ?There is no height or weight on file to calculate BMI. ? ?Orders:  ?No orders of the defined types were placed in this encounter. ? ?Meds ordered this encounter  ?Medications  ? gabapentin (NEURONTIN) 300 MG capsule  ?  Sig: Take 1 capsule (300 mg total) by  mouth 3 (three) times daily. 3 times a day when necessary neuropathy pain  ?  Dispense:  90 capsule  ?  Refill:  3  ? ? ? Procedures: ?No procedures performed ? ?Clinical Data: ?No additional findings. ? ?ROS: ? ?All other systems negative, except as noted in the HPI. ?Review of Systems ? ?Objective: ?Vital Signs: There were no vitals taken for this visit. ? ?Specialty Comments:  ?No specialty comments available. ? ?PMFS History: ?Patient Active Problem List  ? Diagnosis Date Noted  ? Anemia 05/03/2021  ? Tick bite, infected 08/07/2020  ? Irregular heart beats 04/08/2020  ? Peripheral neuropathy 04/06/2019  ? 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 Low grade fever 04/06/2019  ? Bilateral leg pain 04/04/2018  ? Bilateral bunions 04/04/2018  ? Ganglion cyst of dorsum of right wrist 04/04/2018  ? Chronic low back pain 04/04/2018  ? Gait disorder 04/04/2018  ? Recurrent falls while walking 04/04/2018  ? Abrasion of knee, bilateral 04/04/2018  ? Bladder cancer ([place]) 03/19/2016  ? Esophageal stricture 03/19/2016  ? Retinal tear of right eye 03/19/2016  ?  Dizziness 03/19/2016  ? Hyperglycemia 03/16/2015  ? 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 Prostate cancer ([place]) 04/13/2012  ? Hyperlipidemia with target LDL less than 130 04/12/2012  ? Encounter for well adult exam with abnormal findings 03/11/2011  ? TOBACCO ABUSE, HX OF 02/12/2010  ? SHOULDER PAIN 10/10/2007  ? Vitamin B12 deficiency 07/14/2007  ? PODAGRA 07/14/2007  ? ERECTILE DYSFUNCTION 07/14/2007  ? TREMOR, ESSENTIAL 07/14/2007  ? 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 Essential hypertension 07/14/2007  ? PERIPHERAL VASCULAR DISEASE 07/14/2007  ? EXTERNAL HEMORRHOIDS 07/14/2007  ? ESOPHAGITIS 07/14/2007  ? GERD 07/14/2007  ? TONSILLECTOMY, HX OF 07/14/2007  ? ?Past Medical History:  ?Diagnosis Date  ? DUODENITIS, WITHOUT HEMORRHAGE 07/14/2007  ? ERECTILE DYSFUNCTION 07/14/2007  ? Esophageal stricture 03/19/2016  ? ESOPHAGITIS 07/14/2007  ? EXTERNAL HEMORRHOIDS 07/14/2007  ? GERD 07/14/2007  ? Gout   ? HYPERTENSION 07/14/2007  ? PERIPHERAL NEUROPATHY 07/14/2007  ? Peripheral neuropathy 04/06/2019  ? PERIPHERAL VASCULAR DISEASE 07/14/2007  ? PODAGRA 07/14/2007  ? Prostate cancer ([place]) 02/2012  ? pt had screening and then biopsy  ? Retinal tear of right eye 03/19/2016  ? TOBACCO ABUSE, HX OF 02/12/2010  ? TONSILLECTOMY, HX OF 07/14/2007  ? TREMOR, ESSENTIAL 07/14/2007  ? VITAMIN B12 DEFICIENCY 07/14/2007  ?  ?Family History  ?Problem Relation Age of Onset  ? Hypothyroidism Daughter   ? Breast cancer Other   ? Cancer Mother   ? Other Father   ? Breast cancer Sister   ?  ?Past Surgical History:  ?Procedure Laterality Date  ? Excision of lipoma    ? PROSTATE BIOPSY Bilateral 03/10/2012  ? surgery to reduce turbinate and straighten deviated septum  2016  ? TONSILLECTOMY    ? ?Social History  ? ?Occupational History  ? Occupation: retired  ?  Employer: RETIRED  ?Tobacco Use  ? Smoking status: Former  ?  Types: Cigarettes  ?  Quit date: 02/23/1994  ?  Years since quitting: 27.3  ? Smokeless tobacco:  Former  ?Vaping Use  ? Vaping Use: Never used  ?Substance and Sexual Activity  ? Alcohol use: Not Currently  ?  Comment: d/c beer about 5 months ago  ? Drug use: No  ? Sexual activity: Yes  ?  Partners: Female  ? ? ? ? ? ?

## 2021-07-08 ENCOUNTER — Ambulatory Visit: Payer: Medicare Other | Admitting: Orthopedic Surgery

## 2021-07-08 DIAGNOSIS — B351 Tinea unguium: Secondary | ICD-10-CM

## 2021-07-08 DIAGNOSIS — L97511 Non-pressure chronic ulcer of other part of right foot limited to breakdown of skin: Secondary | ICD-10-CM

## 2021-07-27 DIAGNOSIS — J019 Acute sinusitis, unspecified: Secondary | ICD-10-CM | POA: Diagnosis not present

## 2021-08-03 ENCOUNTER — Encounter: Payer: Self-pay | Admitting: Orthopedic Surgery

## 2021-08-03 NOTE — Progress Notes (Signed)
Office Visit Note   Patient: Stephen Meza           Date of Birth: 1933/04/16           MRN: [id number] Visit Date: 07/08/2021              Requested by: Biagio Borg, MD [address] [place],  [place] [address] PCP: Biagio Borg, MD  Chief Complaint  Patient presents with   Right Foot - Wound Check      HPI: Patient is an 88 year old gentleman who presents in follow-up for Wagner grade 1 ulcer as well as onychomycotic nails.  Patient is on Neurontin 300 mg 3 times a day for neuropathy.  Assessment & Plan: Visit Diagnoses:  1. Non-pressure chronic ulcer of other part of right foot limited to breakdown of skin ([place])   2. Onychomycosis     Plan: Nails were trimmed ulcer debrided.  Recommended patient wean off his Neurontin.  Follow-Up Instructions: Return in about 4 weeks (around 08/05/2021).   Ortho Exam  Patient is alert, oriented, no adenopathy, well-dressed, normal affect, normal respiratory effort. Patient states he feels bad with taken the Neurontin.  He has thickened discolored onychomycotic nails x10 that he is unable to safely trim on his own and the nails were trimmed Q03 without complication.  Patient has a good dorsalis pedis pulse bilaterally.  Patient has a Wagner grade 1 ulcer beneath the right fifth metatarsal head.  This is 10 mm in diameter.  After informed consent a 10 blade knife was used to debride the skin and soft tissue back to healthy viable tissue after debridement the ulcer was 2 cm in diameter and 1 mm deep.  There is no exposed bone or tendon.  Imaging: No results found. No images are attached to the encounter.  Labs: Lab Results  Component Value Date   HGBA1C 5.9 04/22/2021   HGBA1C 5.9 11/01/2020   HGBA1C 5.4 04/04/2018   ESRSEDRATE 19 08/01/2018   LABURIC 5.0 11/28/2012   LABURIC 6.4 03/10/2011   LABURIC 6.8 02/12/2010     Lab Results  Component Value Date   ALBUMIN 4.2 04/22/2021   ALBUMIN 4.3 11/01/2020   ALBUMIN  4.4 04/08/2020    No results found for: "MG" Lab Results  Component Value Date   VD25OH 47.18 04/22/2021   VD25OH 50.24 11/01/2020   VD25OH 20.78 (L) 04/06/2019    No results found for: "PREALBUMIN"    Latest Ref Rng & Units 04/22/2021   12:10 PM 04/08/2020    2:56 PM 04/06/2019    3:20 PM  CBC EXTENDED  WBC 4.0 - 10.5 K/uL 4.4  6.1  5.9   RBC 4.22 - 5.81 Mil/uL 4.21  4.41  4.51   Hemoglobin 13.0 - 17.0 g/dL 12.1  12.9  13.1   HCT 39.0 - 52.0 % 36.6  38.5  40.1   Platelets 150.0 - 400.0 K/uL 185.0  202.0  197.0   NEUT# 1.4 - 7.7 K/uL 2.6  4.0  3.8   Lymph# 0.7 - 4.0 K/uL 1.2  1.5  1.4      There is no height or weight on file to calculate BMI.  Orders:  No orders of the defined types were placed in this encounter.  No orders of the defined types were placed in this encounter.    Procedures: No procedures performed  Clinical Data: No additional findings.  ROS:  All other systems negative, except as noted in the HPI.  Review of Systems  Objective: Vital Signs: There were no vitals taken for this visit.  Specialty Comments:  No specialty comments available.  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   Low grade fever 04/06/2019   Bilateral leg pain 04/04/2018   Bilateral bunions 04/04/2018   Ganglion cyst of dorsum of right wrist 04/04/2018   Chronic low back pain 04/04/2018   Gait disorder 04/04/2018   Recurrent falls while walking 04/04/2018   Abrasion of knee, bilateral 04/04/2018   Bladder cancer ([place]) 03/19/2016   Esophageal stricture 03/19/2016   Retinal tear of right eye 03/19/2016   Dizziness 03/19/2016   Hyperglycemia 03/16/2015   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   Prostate cancer ([place]) 04/13/2012    Hyperlipidemia with target LDL less than 130 04/12/2012   Encounter for well adult exam with abnormal findings 03/11/2011   TOBACCO ABUSE, HX OF 02/12/2010   SHOULDER PAIN 10/10/2007   Vitamin B12 deficiency 07/14/2007   PODAGRA 07/14/2007   ERECTILE DYSFUNCTION 07/14/2007   TREMOR, ESSENTIAL 34/28/7681   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   Essential hypertension 07/14/2007   PERIPHERAL VASCULAR DISEASE 07/14/2007   EXTERNAL HEMORRHOIDS 07/14/2007   ESOPHAGITIS 07/14/2007   GERD 07/14/2007   TONSILLECTOMY, HX OF 07/14/2007   Past Medical History:  Diagnosis Date   DUODENITIS, WITHOUT HEMORRHAGE 07/14/2007   ERECTILE DYSFUNCTION 07/14/2007   Esophageal stricture 03/19/2016   ESOPHAGITIS 07/14/2007   EXTERNAL HEMORRHOIDS 07/14/2007   GERD 07/14/2007   Gout    HYPERTENSION 07/14/2007   PERIPHERAL NEUROPATHY 07/14/2007   Peripheral neuropathy 04/06/2019   PERIPHERAL VASCULAR DISEASE 07/14/2007   PODAGRA 07/14/2007   Prostate cancer ([place]) 02/2012   pt had screening and then biopsy   Retinal tear of right eye 03/19/2016   TOBACCO ABUSE, HX OF 02/12/2010   TONSILLECTOMY, HX OF 07/14/2007   TREMOR, ESSENTIAL 07/14/2007   VITAMIN B12 DEFICIENCY 07/14/2007    Family History  Problem Relation Age of Onset   Hypothyroidism Daughter    Breast cancer Other    Cancer Mother    Other Father    Breast cancer Sister     Past Surgical History:  Procedure Laterality Date   Excision of lipoma     PROSTATE BIOPSY Bilateral 03/10/2012   surgery to reduce turbinate and straighten deviated septum  2016   TONSILLECTOMY     Social History   Occupational History   Occupation: retired    Fish farm manager: RETIRED  Tobacco Use   Smoking status: Former    Types: Cigarettes    Quit date: 02/23/1994    Years since quitting: 27.4   Smokeless tobacco: Former  Scientific laboratory technician Use: Never used  Substance and Sexual Activity   Alcohol use: Not Currently    Comment: d/c beer about 5 months ago    Drug use: No   Sexual activity: Yes    Partners: Female

## 2021-08-04 ENCOUNTER — Ambulatory Visit: Payer: Medicare Other | Admitting: Orthopedic Surgery

## 2021-08-04 DIAGNOSIS — L97511 Non-pressure chronic ulcer of other part of right foot limited to breakdown of skin: Secondary | ICD-10-CM | POA: Diagnosis not present

## 2021-08-12 ENCOUNTER — Encounter: Payer: Self-pay | Admitting: Orthopedic Surgery

## 2021-08-12 NOTE — Progress Notes (Signed)
Office Visit Note   Patient: Stephen Meza           Date of Birth: 12/23/1933           MRN: [id number] Visit Date: 08/04/2021              Requested by: Biagio Borg, MD [address] [place],  [place] [address] PCP: Biagio Borg, MD  Chief Complaint  Patient presents with   Right Foot - Follow-up      HPI: Patient is an 88 year old gentleman who presents in follow-up for right foot fifth metatarsal head ulcer.  Patient currently in regular shoewear full weightbearing denies any drainage or concerns.  Assessment & Plan: Visit Diagnoses:  1. Non-pressure chronic ulcer of other part of right foot limited to breakdown of skin ([place])     Plan: Ulcer was debrided no signs of infection.  Follow-Up Instructions: Return in about 4 weeks (around 09/01/2021).   Ortho Exam  Patient is alert, oriented, no adenopathy, well-dressed, normal affect, normal respiratory effort. Examination patient has no cellulitis odor or drainage in the right foot there is a Wagner grade 1 ulcer beneath the right fifth metatarsal head.  Ulcer is 1 cm diameter.  After informed consent a 10 blade knife was used to debride the skin and soft tissue back to healthy viable granulation tissue there is no exposed bone or tendon no tunneling no drainage.  Ulcer was 2 cm in diameter 1 mm deep after debridement.  Imaging: No results found. No images are attached to the encounter.  Labs: Lab Results  Component Value Date   HGBA1C 5.9 04/22/2021   HGBA1C 5.9 11/01/2020   HGBA1C 5.4 04/04/2018   ESRSEDRATE 19 08/01/2018   LABURIC 5.0 11/28/2012   LABURIC 6.4 03/10/2011   LABURIC 6.8 02/12/2010     Lab Results  Component Value Date   ALBUMIN 4.2 04/22/2021   ALBUMIN 4.3 11/01/2020   ALBUMIN 4.4 04/08/2020    No results found for: "MG" Lab Results  Component Value Date   VD25OH 47.18 04/22/2021   VD25OH 50.24 11/01/2020   VD25OH 20.78 (L) 04/06/2019    No results found for:  "PREALBUMIN"    Latest Ref Rng & Units 04/22/2021   12:10 PM 04/08/2020    2:56 PM 04/06/2019    3:20 PM  CBC EXTENDED  WBC 4.0 - 10.5 K/uL 4.4  6.1  5.9   RBC 4.22 - 5.81 Mil/uL 4.21  4.41  4.51   Hemoglobin 13.0 - 17.0 g/dL 12.1  12.9  13.1   HCT 39.0 - 52.0 % 36.6  38.5  40.1   Platelets 150.0 - 400.0 K/uL 185.0  202.0  197.0   NEUT# 1.4 - 7.7 K/uL 2.6  4.0  3.8   Lymph# 0.7 - 4.0 K/uL 1.2  1.5  1.4      There is no height or weight on file to calculate BMI.  Orders:  No orders of the defined types were placed in this encounter.  No orders of the defined types were placed in this encounter.    Procedures: No procedures performed  Clinical Data: No additional findings.  ROS:  All other systems negative, except as noted in the HPI. Review of Systems  Objective: Vital Signs: There were no vitals taken for this visit.  Specialty Comments:  No specialty comments available.  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   Low grade fever 04/06/2019   Bilateral leg pain 04/04/2018   Bilateral bunions 04/04/2018   Ganglion cyst of dorsum of right wrist 04/04/2018   Chronic low back pain 04/04/2018   Gait disorder 04/04/2018   Recurrent falls while walking 04/04/2018   Abrasion of knee, bilateral 04/04/2018   Bladder cancer ([place]) 03/19/2016   Esophageal stricture 03/19/2016   Retinal tear of right eye 03/19/2016   Dizziness 03/19/2016   Hyperglycemia 03/16/2015   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   Prostate cancer ([place]) 04/13/2012   Hyperlipidemia with target LDL less than 130 04/12/2012   Encounter for well adult exam with abnormal findings 03/11/2011   TOBACCO ABUSE, HX OF 02/12/2010   SHOULDER PAIN 10/10/2007   Vitamin B12  deficiency 07/14/2007   PODAGRA 07/14/2007   ERECTILE DYSFUNCTION 07/14/2007   TREMOR, ESSENTIAL 97/28/2060   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   Essential hypertension 07/14/2007   PERIPHERAL VASCULAR DISEASE 07/14/2007   EXTERNAL HEMORRHOIDS 07/14/2007   ESOPHAGITIS 07/14/2007   GERD 07/14/2007   TONSILLECTOMY, HX OF 07/14/2007   Past Medical History:  Diagnosis Date   DUODENITIS, WITHOUT HEMORRHAGE 07/14/2007   ERECTILE DYSFUNCTION 07/14/2007   Esophageal stricture 03/19/2016   ESOPHAGITIS 07/14/2007   EXTERNAL HEMORRHOIDS 07/14/2007   GERD 07/14/2007   Gout    HYPERTENSION 07/14/2007   PERIPHERAL NEUROPATHY 07/14/2007   Peripheral neuropathy 04/06/2019   PERIPHERAL VASCULAR DISEASE 07/14/2007   PODAGRA 07/14/2007   Prostate cancer ([place]) 02/2012   pt had screening and then biopsy   Retinal tear of right eye 03/19/2016   TOBACCO ABUSE, HX OF 02/12/2010   TONSILLECTOMY, HX OF 07/14/2007   TREMOR, ESSENTIAL 07/14/2007   VITAMIN B12 DEFICIENCY 07/14/2007    Family History  Problem Relation Age of Onset   Hypothyroidism Daughter    Breast cancer Other    Cancer Mother    Other Father    Breast cancer Sister     Past Surgical History:  Procedure Laterality Date   Excision of lipoma     PROSTATE BIOPSY Bilateral 03/10/2012   surgery to reduce turbinate and straighten deviated septum  2016   TONSILLECTOMY     Social History   Occupational History   Occupation: retired    Fish farm manager: RETIRED  Tobacco Use   Smoking status: Former    Types: Cigarettes    Quit date: 02/23/1994    Years since quitting: 27.4   Smokeless tobacco: Former  Scientific laboratory technician Use: Never used  Substance and Sexual Activity   Alcohol use: Not Currently    Comment: d/c beer about 5 months ago   Drug use: No   Sexual activity: Yes    Partners: Female

## 2021-08-13 ENCOUNTER — Ambulatory Visit (INDEPENDENT_AMBULATORY_CARE_PROVIDER_SITE_OTHER): Payer: Medicare Other

## 2021-08-13 VITALS — BP 118/78 | HR 72 | Temp 97.1°F | Resp 16 | Ht 72.0 in | Wt 178.8 lb

## 2021-08-13 DIAGNOSIS — Z0000 Encounter for general adult medical examination without abnormal findings: Secondary | ICD-10-CM

## 2021-08-13 NOTE — Patient Instructions (Signed)
[name] , Thank you for taking time to come for your Medicare Wellness Visit. I appreciate your ongoing commitment to your health goals. Please review the following plan we discussed and let me know if I can assist you in the future.   Screening recommendations/referrals: Colonoscopy: Not a candidate for screening due to age Recommended yearly ophthalmology/optometry visit for glaucoma screening and checkup Recommended yearly dental visit for hygiene and checkup  Vaccinations: Influenza vaccine: 11/01/2020 Pneumococcal vaccine: 12/23/2006, 05/01/2013 Tdap vaccine: 04/06/2019; due every 10 years Shingles vaccine: 08/03/2017, 10/09/2017   Covid-19: 03/11/2019, 04/01/2019, 12/08/2019, 09/25/2020  Advanced directives: Yes  Conditions/risks identified: Yes  Next appointment: Please schedule your next Medicare Wellness Visit with your Nurse Health Advisor in 1 year by calling [phone].  Preventive Care 10 Years and Older, Male Preventive care refers to lifestyle choices and visits with your health care provider that can promote health and wellness. What does preventive care include? A yearly physical exam. This is also called an annual well check. Dental exams once or twice a year. Routine eye exams. Ask your health care provider how often you should have your eyes checked. Personal lifestyle choices, including: Daily care of your teeth and gums. Regular physical activity. Eating a healthy diet. Avoiding tobacco and drug use. Limiting alcohol use. Practicing safe sex. Taking low doses of aspirin every day. Taking vitamin and mineral supplements as recommended by your health care provider. What happens during an annual well check? The services and screenings done by your health care provider during your annual well check will depend on your age, overall health, lifestyle risk factors, and family history of disease. Counseling  Your health care provider may ask you questions about  your: Alcohol use. Tobacco use. Drug use. Emotional well-being. Home and relationship well-being. Sexual activity. Eating habits. History of falls. Memory and ability to understand (cognition). Work and work Statistician. Screening  You may have the following tests or measurements: Height, weight, and BMI. Blood pressure. Lipid and cholesterol levels. These may be checked every 5 years, or more frequently if you are over 47 years old. Skin check. Lung cancer screening. You may have this screening every year starting at age 19 if you have a 30-pack-year history of smoking and currently smoke or have quit within the past 15 years. Fecal occult blood test (FOBT) of the stool. You may have this test every year starting at age 18. Flexible sigmoidoscopy or colonoscopy. You may have a sigmoidoscopy every 5 years or a colonoscopy every 10 years starting at age 35. Prostate cancer screening. Recommendations will vary depending on your family history and other risks. Hepatitis C blood test. Hepatitis B blood test. Sexually transmitted disease (STD) testing. Diabetes screening. This is done by checking your blood sugar (glucose) after you have not eaten for a while (fasting). You may have this done every 1-3 years. Abdominal aortic aneurysm (AAA) screening. You may need this if you are a current or former smoker. Osteoporosis. You may be screened starting at age 39 if you are at high risk. Talk with your health care provider about your test results, treatment options, and if necessary, the need for more tests. Vaccines  Your health care provider may recommend certain vaccines, such as: Influenza vaccine. This is recommended every year. Tetanus, diphtheria, and acellular pertussis (Tdap, Td) vaccine. You may need a Td booster every 10 years. Zoster vaccine. You may need this after age 21. Pneumococcal 13-valent conjugate (PCV13) vaccine. One dose is recommended after age 60.  Pneumococcal  polysaccharide (PPSV23) vaccine. One dose is recommended after age 52. Talk to your health care provider about which screenings and vaccines you need and how often you need them. This information is not intended to replace advice given to you by your health care provider. Make sure you discuss any questions you have with your health care provider. Document Released: 03/08/2015 Document Revised: 10/30/2015 Document Reviewed: 12/11/2014 Elsevier Interactive Patient Education  2017 [place] Prevention in the Home Falls can cause injuries. They can happen to people of all ages. There are many things you can do to make your home safe and to help prevent falls. What can I do on the outside of my home? Regularly fix the edges of walkways and driveways and fix any cracks. Remove anything that might make you trip as you walk through a door, such as a raised step or threshold. Trim any bushes or trees on the path to your home. Use bright outdoor lighting. Clear any walking paths of anything that might make someone trip, such as rocks or tools. Regularly check to see if handrails are loose or broken. Make sure that both sides of any steps have handrails. Any raised decks and porches should have guardrails on the edges. Have any leaves, snow, or ice cleared regularly. Use sand or salt on walking paths during winter. Clean up any spills in your garage right away. This includes oil or grease spills. What can I do in the bathroom? Use night lights. Install grab bars by the toilet and in the tub and shower. Do not use towel bars as grab bars. Use non-skid mats or decals in the tub or shower. If you need to sit down in the shower, use a plastic, non-slip stool. Keep the floor dry. Clean up any water that spills on the floor as soon as it happens. Remove soap buildup in the tub or shower regularly. Attach bath mats securely with double-sided non-slip rug tape. Do not have throw rugs and other  things on the floor that can make you trip. What can I do in the bedroom? Use night lights. Make sure that you have a light by your bed that is easy to reach. Do not use any sheets or blankets that are too big for your bed. They should not hang down onto the floor. Have a firm chair that has side arms. You can use this for support while you get dressed. Do not have throw rugs and other things on the floor that can make you trip. What can I do in the kitchen? Clean up any spills right away. Avoid walking on wet floors. Keep items that you use a lot in easy-to-reach places. If you need to reach something above you, use a strong step stool that has a grab bar. Keep electrical cords out of the way. Do not use floor polish or wax that makes floors slippery. If you must use wax, use non-skid floor wax. Do not have throw rugs and other things on the floor that can make you trip. What can I do with my stairs? Do not leave any items on the stairs. Make sure that there are handrails on both sides of the stairs and use them. Fix handrails that are broken or loose. Make sure that handrails are as long as the stairways. Check any carpeting to make sure that it is firmly attached to the stairs. Fix any carpet that is loose or worn. Avoid having throw rugs at the  top or bottom of the stairs. If you do have throw rugs, attach them to the floor with carpet tape. Make sure that you have a light switch at the top of the stairs and the bottom of the stairs. If you do not have them, ask someone to add them for you. What else can I do to help prevent falls? Wear shoes that: Do not have high heels. Have rubber bottoms. Are comfortable and fit you well. Are closed at the toe. Do not wear sandals. If you use a stepladder: Make sure that it is fully opened. Do not climb a closed stepladder. Make sure that both sides of the stepladder are locked into place. Ask someone to hold it for you, if possible. Clearly  mark and make sure that you can see: Any grab bars or handrails. First and last steps. Where the edge of each step is. Use tools that help you move around (mobility aids) if they are needed. These include: Canes. Walkers. Scooters. Crutches. Turn on the lights when you go into a dark area. Replace any light bulbs as soon as they burn out. Set up your furniture so you have a clear path. Avoid moving your furniture around. If any of your floors are uneven, fix them. If there are any pets around you, be aware of where they are. Review your medicines with your doctor. Some medicines can make you feel dizzy. This can increase your chance of falling. Ask your doctor what other things that you can do to help prevent falls. This information is not intended to replace advice given to you by your health care provider. Make sure you discuss any questions you have with your health care provider. Document Released: 12/06/2008 Document Revised: 07/18/2015 Document Reviewed: 03/16/2014 Elsevier Interactive Patient Education  2017 [employer].

## 2021-08-13 NOTE — Progress Notes (Signed)
Subjective:   Kalvin A. Newsome is a 88 y.o. male who presents for Medicare Annual/Subsequent preventive examination.  Review of Systems     Cardiac Risk Factors include: advanced age (>50mn, >>53women);dyslipidemia;hypertension;male gender     Objective:    Today's Vitals   08/13/21 1308  BP: 118/78  Pulse: 72  Resp: 16  Temp: (!) 97.1 F (36.2 C)  SpO2: 98%  Weight: 178 lb 12.8 oz (81.1 kg)  Height: 6' (1.829 m)  PainSc: 0-No pain   Body mass index is 24.25 kg/m.     08/13/2021    2:09 PM 08/07/2020    1:27 PM 06/21/2019    3:57 PM  Advanced Directives  Does Patient Have a Medical Advance Directive? Yes Yes Yes  Type of Advance Directive Living will;Healthcare Power of Attorney Living will;Healthcare Power of [place] will  Does patient want to make changes to medical advance directive? No - Patient declined No - Patient declined No - Patient declined  Copy of [place] Chart? No - copy requested No - copy requested No - copy requested    Current Medications (verified) Outpatient Encounter Medications as of 08/13/2021  Medication Sig   allopurinol (ZYLOPRIM) 100 MG tablet Take 1 tablet (100 mg total) by mouth daily.   amLODipine (NORVASC) 2.5 MG tablet TAKE 1 TABLET BY MOUTH EVERY DAY   Cyanocobalamin (B-12) 3000 MCG CAPS Take by mouth daily.   docusate sodium (COLACE) 100 MG capsule Take 100 mg by mouth once.   gabapentin (NEURONTIN) 100 MG capsule Take 2 capsules (200 mg total) by mouth 3 (three) times daily.   gabapentin (NEURONTIN) 300 MG capsule Take 1 capsule (300 mg total) by mouth 3 (three) times daily. 3 times a day when necessary neuropathy pain   omeprazole (PRILOSEC) 40 MG capsule TAKE 1 CAPSULE BY MOUTH EVERY DAY   atorvastatin (LIPITOR) 20 MG tablet Take 1 tablet (20 mg total) by mouth daily. (Patient not taking: Reported on 05/02/2021)   ezetimibe (ZETIA) 10 MG tablet Take 1 tablet (10 mg  total) by mouth daily. (Patient not taking: Reported on 08/13/2021)   No facility-administered encounter medications on file as of 08/13/2021.    Allergies (verified) Nitrofuran derivatives and Ciprofloxacin hcl   History: Past Medical History:  Diagnosis Date   DUODENITIS, WITHOUT HEMORRHAGE 07/14/2007   ERECTILE DYSFUNCTION 07/14/2007   Esophageal stricture 03/19/2016   ESOPHAGITIS 07/14/2007   EXTERNAL HEMORRHOIDS 07/14/2007   GERD 07/14/2007   Gout    HYPERTENSION 07/14/2007   PERIPHERAL NEUROPATHY 07/14/2007   Peripheral neuropathy 04/06/2019   PERIPHERAL VASCULAR DISEASE 07/14/2007   PODAGRA 07/14/2007   [place] had screening and then biopsy   Retinal tear of right eye 03/19/2016   TOBACCO ABUSE, HX OF 02/12/2010   TONSILLECTOMY, HX OF 07/14/2007   TREMOR, ESSENTIAL 07/14/2007   VITAMIN B12 DEFICIENCY 07/14/2007   Past Surgical History:  Procedure Laterality Date   Excision of lipoma     PROSTATE BIOPSY Bilateral 03/10/2012   surgery to reduce turbinate and straighten deviated septum  2016   TONSILLECTOMY     Family History  Problem Relation Age of Onset   Hypothyroidism Daughter    Breast cancer Other    Cancer Mother    Other Father    Breast cancer Sister    Social History   Socioeconomic History   Marital status: Married    Spouse name: Not on file  Number of children: 2   Years of education: 12   Highest education level: Not on file  Occupational History   Occupation: retired    Fish farm manager: RETIRED  Tobacco Use   Smoking status: Former    Types: Cigarettes    Quit date: 02/23/1994    Years since quitting: 27.4   Smokeless tobacco: Former  Scientific laboratory technician Use: Never used  Substance and Sexual Activity   Alcohol use: Not Currently    Comment: d/c beer about 5 months ago   Drug use: No   Sexual activity: Yes    Partners: Female  Other Topics Concern   Not on file  Social History Narrative   HSG. Married '1958. 2 dtrs; 4  g-children. Work - retired. 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       Not disabled      Retired since 1986      Right handed      Two story home   Social Determinants of Health   Financial Resource Strain: Low Risk  (08/13/2021)   Overall Financial Resource Strain (CARDIA)    Difficulty of Paying Living Expenses: Not hard at all  Food Insecurity: No Food Insecurity (08/13/2021)   Hunger Vital Sign    Worried About Running Out of Food in the Last Year: Never true    [place] in the Last Year: Never true  Transportation Needs: No Transportation Needs (08/13/2021)   PRAPARE - Hydrologist (Medical): No    Lack of Transportation (Non-Medical): No  Physical Activity: Sufficiently Active (08/13/2021)   Exercise Vital Sign    Days of Exercise per Week: 5 days    Minutes of Exercise per Session: 30 min  Stress: No Stress Concern Present (08/13/2021)   [place]    Feeling of Stress : Not at all  Social Connections: [place] (08/13/2021)   Social Connection and Isolation Panel [NHANES]    Frequency of Communication with Friends and Family: More than three times a week    Frequency of Social Gatherings with Friends and Family: More than three times a week    Attends Religious Services: More than 4 times per year    Active Member of [employer] or Organizations: Yes    Attends Music therapist: More than 4 times per year    Marital Status: Married    Tobacco Counseling Counseling given: Not Answered   Clinical Intake:  Pre-visit preparation completed: Yes  Pain : No/denies pain Pain Score: 0-No pain     BMI - recorded: 24.25 Nutritional Status: BMI of 19-24  Normal Nutritional Risks: None Diabetes: No  How often do you need to have someone help you when you read instructions, pamphlets, or  other written materials from your doctor or pharmacy?: 1 - Never What is the last grade level you completed in school?: HSG  Diabetic? no  Interpreter Needed?: No  Information entered by :: Lenny Fiumara N. [place] Gopal, LPN.   Activities of Daily Living    08/13/2021    2:09 PM  In your present state of health, do you have any difficulty performing the following activities:  Hearing? 0  Vision? 0  Difficulty concentrating or making decisions? 0  Walking or climbing stairs? 0  Dressing or bathing? 0  Doing errands, shopping? 0  Preparing Food and eating ?  N  Using the Toilet? N  In the past six months, have you accidently leaked urine? N  Do you have problems with loss of bowel control? N  Managing your Medications? N  Managing your Finances? N  Housekeeping or managing your Housekeeping? N    Patient Care Team: Biagio Borg, MD as PCP - General (Internal Medicine) Lynnell Dike, OD as Consulting Physician (Optometry) Darleen Crocker, MD as Consulting Physician (Ophthalmology) Lucas Mallow, MD as Consulting Physician (Urology)  Indicate any recent Medical Services you may have received from other than [facility] providers in the past year (date may be approximate).     Assessment:   This is a routine wellness examination for [place].  Hearing/Vision screen Hearing Screening - Comments:: Patient denied any hearing difficulty.   No hearing aids.  Vision Screening - Comments:: Patient does not wear any corrective lenses/contacts.   Eye exam done by: Cleon Gustin, OD and Darleen Crocker, MD    Dietary issues and exercise activities discussed: Current Exercise Habits: Home exercise routine, Type of exercise: walking;Other - see comments (yard work, gardening (5 acres)), Time (Minutes): 30, Frequency (Times/Week): 5, Weekly Exercise (Minutes/Week): 150, Intensity: Moderate   Goals Addressed             This Visit's Progress    My goal is to continue to maintain my health and  keep living.        Depression Screen    08/13/2021    2:08 PM 05/02/2021    2:10 PM 05/02/2021    1:57 PM 11/01/2020    2:48 PM 08/07/2020    1:25 PM 04/08/2020    2:12 PM 06/21/2019    4:02 PM  PHQ 2/9 Scores  PHQ - 2 Score 0 0 0 0 0 0 0    Fall Risk    08/13/2021    2:00 PM 05/02/2021    2:10 PM 05/02/2021    1:57 PM 11/01/2020    2:48 PM 08/07/2020    1:29 PM  Fall Risk   Falls in the past year? 0 0 0 0 0  Number falls in past yr: 0 0 0 0 0  Injury with Fall? 0 0 0 0 0  Risk for fall due to : No Fall Risks    No Fall Risks  Follow up Falls evaluation completed    Falls evaluation completed    FALL RISK PREVENTION PERTAINING TO THE HOME:  Any stairs in or around the home? Yes  If so, are there any without handrails? No  Home free of loose throw rugs in walkways, pet beds, electrical cords, etc? Yes  Adequate lighting in your home to reduce risk of falls? Yes   ASSISTIVE DEVICES UTILIZED TO PREVENT FALLS:  Life alert? No  Use of a cane, walker or w/c? Yes  Grab bars in the bathroom? No  Shower chair or bench in shower? Yes  Elevated toilet seat or a handicapped toilet? No   TIMED UP AND GO:  Was the test performed? Yes .  Length of time to ambulate 10 feet: 6 sec.   Gait steady and fast with assistive device  Cognitive Function:      08/01/2018   11:00 AM  Montreal Cognitive Assessment   Visuospatial/ Executive (0/5) 3  Naming (0/3) 2  Attention: Read list of digits (0/2) 2  Attention: Read list of letters (0/1) 1  Attention: Serial 7 subtraction starting at 100 (0/3) 3  Language: Repeat phrase (  0/2) 1  Language : Fluency (0/1) 0  Abstraction (0/2) 0  Delayed Recall (0/5) 0  Orientation (0/6) 5  Total 17  Adjusted Score (based on education) 18      08/13/2021    2:10 PM  6CIT Screen  What Year? 0 points  What month? 0 points  What time? 0 points  Count back from 20 0 points  Months in reverse 0 points  Repeat phrase 0 points  Total Score 0 points     Immunizations Immunization History  Administered Date(s) Administered   Fluad Quad(high Dose 65+) 10/27/2018, 11/27/2019, 11/01/2020   Influenza Whole 12/23/2006, 12/17/2008, 11/27/2011   Influenza, High Dose Seasonal PF 12/16/2017, 10/31/2018   Influenza-Unspecified 12/08/2014, 11/23/2016   PFIZER(Purple Top)SARS-COV-2 Vaccination 03/11/2019, 04/01/2019, 12/08/2019, 09/25/2020   Pneumococcal Conjugate-13 05/01/2013   Pneumococcal Polysaccharide-23 12/23/2006   Td 10/23/2008   Tdap 04/06/2019   Zoster Recombinat (Shingrix) 08/03/2017, 10/09/2017   Zoster, Live 12/23/2006    TDAP status: Up to date  Flu Vaccine status: Up to date  Pneumococcal vaccine status: Up to date  Covid-19 vaccine status: Completed vaccines  Qualifies for Shingles Vaccine? Yes   Zostavax completed Yes   Shingrix Completed?: Yes  Screening Tests Health Maintenance  Topic Date Due   COVID-19 Vaccine (5 - Booster for Pfizer series) 11/20/2020   INFLUENZA VACCINE  09/23/2021   TETANUS/TDAP  04/05/2029   Pneumonia Vaccine 3+ Years old  Completed   Zoster Vaccines- Shingrix  Completed   HPV VACCINES  Aged Out    Health Maintenance  Health Maintenance Due  Topic Date Due   COVID-19 Vaccine (5 - Booster for [place] series) 11/20/2020    Colorectal cancer screening: No longer required.   Lung Cancer Screening: (Low Dose CT Chest recommended if Age 33-80 years, 30 pack-year currently smoking OR have quit w/in 15years.) does not qualify.   Lung Cancer Screening Referral: no  Additional Screening:  Hepatitis C Screening: does not qualify; Completed no  Vision Screening: Recommended annual ophthalmology exams for early detection of glaucoma and other disorders of the eye. Is the patient up to date with their annual eye exam?  Yes  Who is the provider or what is the name of the office in which the patient attends annual eye exams? Cleon Gustin, OD and Darleen Crocker, MD If pt is not established  with a provider, would they like to be referred to a provider to establish care? No .   Dental Screening: Recommended annual dental exams for proper oral hygiene  Community Resource Referral / Chronic Care Management: CRR required this visit?  No   CCM required this visit?  No      Plan:     I have personally reviewed and noted the following in the patient's chart:   Medical and social history Use of alcohol, tobacco or illicit drugs  Current medications and supplements including opioid prescriptions. Patient is not currently taking opioid prescriptions. Functional ability and status Nutritional status Physical activity Advanced directives List of other physicians Hospitalizations, surgeries, and ER visits in previous 12 months Vitals Screenings to include cognitive, depression, and falls Referrals and appointments  In addition, I have reviewed and discussed with patient certain preventive protocols, quality metrics, and best practice recommendations. A written personalized care plan for preventive services as well as general preventive health recommendations were provided to patient.     Sheral Flow, LPN   08/19/9483   Nurse Notes: n/a

## 2021-09-01 DIAGNOSIS — Z85828 Personal history of other malignant neoplasm of skin: Secondary | ICD-10-CM | POA: Diagnosis not present

## 2021-09-01 DIAGNOSIS — L570 Actinic keratosis: Secondary | ICD-10-CM | POA: Diagnosis not present

## 2021-09-01 DIAGNOSIS — C44222 Squamous cell carcinoma of skin of right ear and external auricular canal: Secondary | ICD-10-CM | POA: Diagnosis not present

## 2021-09-01 DIAGNOSIS — L821 Other seborrheic keratosis: Secondary | ICD-10-CM | POA: Diagnosis not present

## 2021-09-08 ENCOUNTER — Ambulatory Visit: Payer: Medicare Other | Admitting: Orthopedic Surgery

## 2021-09-08 DIAGNOSIS — L97511 Non-pressure chronic ulcer of other part of right foot limited to breakdown of skin: Secondary | ICD-10-CM

## 2021-09-10 DIAGNOSIS — R3912 Poor urinary stream: Secondary | ICD-10-CM | POA: Diagnosis not present

## 2021-09-10 DIAGNOSIS — C678 Malignant neoplasm of overlapping sites of bladder: Secondary | ICD-10-CM | POA: Diagnosis not present

## 2021-09-10 DIAGNOSIS — R350 Frequency of micturition: Secondary | ICD-10-CM | POA: Diagnosis not present

## 2021-09-17 ENCOUNTER — Other Ambulatory Visit: Payer: Self-pay | Admitting: Internal Medicine

## 2021-09-17 NOTE — Telephone Encounter (Signed)
Please refill as per office routine med refill policy (all routine meds to be refilled for 3 mo or monthly (per pt preference) up to one year from last visit, then month to month grace period for 3 mo, then further med refills will have to be denied) ? ?

## 2021-09-22 ENCOUNTER — Encounter: Payer: Self-pay | Admitting: Orthopedic Surgery

## 2021-09-22 NOTE — Progress Notes (Signed)
Office Visit Note   Patient: Stephen Meza           Date of Birth: 06-Mar-1933           MRN: [id number] Visit Date: 09/08/2021              Requested by: Stephen Borg, MD [address] [place],   [id number] PCP: Stephen Borg, MD  Chief Complaint  Patient presents with   Right Foot - Wound Check, Follow-up    5th MTH ulcer      HPI: Patient is an 88 year old gentleman who presents in follow-up for [facility] grade 1 ulcer right foot fifth metatarsal head he is full weightbearing in regular sneakers.  Patient does have neuropathic pain but did not like the side effects of Neurontin.  Assessment & Plan: Visit Diagnoses:  1. Non-pressure chronic ulcer of other part of right foot limited to breakdown of skin ([place])     Plan: Ulcer was debrided reevaluate in 4 weeks.  Follow-Up Instructions: Return in about 4 weeks (around 10/06/2021).   Ortho Exam  Patient is alert, oriented, no adenopathy, well-dressed, normal affect, normal respiratory effort. Examination patient has a palpable dorsalis pedis pulse he has a Wagner grade 1 ulcer beneath the fifth metatarsal head on the right.  This does not probe to bone or tendon there is no undermining.  After informed consent a 10 blade knife was used to debride the skin and soft tissue back to healthy viable granulation tissue.  Silver nitrate was used hemostasis the ulcer was 2 cm in diameter and 1 mm in depth after debridement.  Imaging: No results found. No images are attached to the encounter.  Labs: Lab Results  Component Value Date   HGBA1C 5.9 04/22/2021   HGBA1C 5.9 11/01/2020   HGBA1C 5.4 04/04/2018   ESRSEDRATE 19 08/01/2018   LABURIC 5.0 11/28/2012   LABURIC 6.4 03/10/2011   LABURIC 6.8 02/12/2010     Lab Results  Component Value Date   ALBUMIN 4.2 04/22/2021   ALBUMIN 4.3 11/01/2020   ALBUMIN 4.4 04/08/2020    No results found for: "MG" Lab Results  Component Value Date   VD25OH 47.18 04/22/2021    VD25OH 50.24 11/01/2020   VD25OH 20.78 (L) 04/06/2019    No results found for: "PREALBUMIN"    Latest Ref Rng & Units 04/22/2021   12:10 PM 04/08/2020    2:56 PM 04/06/2019    3:20 PM  CBC EXTENDED  WBC 4.0 - 10.5 K/uL 4.4  6.1  5.9   RBC 4.22 - 5.81 Mil/uL 4.21  4.41  4.51   Hemoglobin 13.0 - 17.0 g/dL 12.1  12.9  13.1   HCT 39.0 - 52.0 % 36.6  38.5  40.1   Platelets 150.0 - 400.0 K/uL 185.0  202.0  197.0   NEUT# 1.4 - 7.7 K/uL 2.6  4.0  3.8   Lymph# 0.7 - 4.0 K/uL 1.2  1.5  1.4      There is no height or weight on file to calculate BMI.  Orders:  No orders of the defined types were placed in this encounter.  No orders of the defined types were placed in this encounter.    Procedures: No procedures performed  Clinical Data: No additional findings.  ROS:  All other systems negative, except as noted in the HPI. Review of Systems  Objective: Vital Signs: There were no vitals taken for this visit.  Specialty Comments:  No specialty comments  available.  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   Low grade fever 04/06/2019   Bilateral leg pain 04/04/2018   Bilateral bunions 04/04/2018   Ganglion cyst of dorsum of right wrist 04/04/2018   Chronic low back pain 04/04/2018   Gait disorder 04/04/2018   Recurrent falls while walking 04/04/2018   Abrasion of knee, bilateral 04/04/2018   Bladder cancer ([place]) 03/19/2016   Esophageal stricture 03/19/2016   Retinal tear of right eye 03/19/2016   Dizziness 03/19/2016   Hyperglycemia 03/16/2015   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   Prostate cancer ([place]) 04/13/2012   Hyperlipidemia with target LDL less than 130 04/12/2012   Encounter for well adult exam with abnormal findings  03/11/2011   TOBACCO ABUSE, HX OF 02/12/2010   SHOULDER PAIN 10/10/2007   Vitamin B12 deficiency 07/14/2007   PODAGRA 07/14/2007   ERECTILE DYSFUNCTION 07/14/2007   TREMOR, ESSENTIAL 29/03/1113   Alcoholic peripheral neuropathy ([place]) 07/14/2007   Essential hypertension 07/14/2007   PERIPHERAL VASCULAR DISEASE 07/14/2007   EXTERNAL HEMORRHOIDS 07/14/2007   ESOPHAGITIS 07/14/2007   GERD 07/14/2007   TONSILLECTOMY, HX OF 07/14/2007   Past Medical History:  Diagnosis Date   DUODENITIS, WITHOUT HEMORRHAGE 07/14/2007   ERECTILE DYSFUNCTION 07/14/2007   Esophageal stricture 03/19/2016   ESOPHAGITIS 07/14/2007   EXTERNAL HEMORRHOIDS 07/14/2007   GERD 07/14/2007   Gout    HYPERTENSION 07/14/2007   PERIPHERAL NEUROPATHY 07/14/2007   Peripheral neuropathy 04/06/2019   PERIPHERAL VASCULAR DISEASE 07/14/2007   PODAGRA 07/14/2007   Prostate cancer ([place]) 02/2012   pt had screening and then biopsy   Retinal tear of right eye 03/19/2016   TOBACCO ABUSE, HX OF 02/12/2010   TONSILLECTOMY, HX OF 07/14/2007   TREMOR, ESSENTIAL 07/14/2007   VITAMIN B12 DEFICIENCY 07/14/2007    Family History  Problem Relation Age of Onset   Hypothyroidism Daughter    Breast cancer Other    Cancer Mother    Other Father    Breast cancer Sister     Past Surgical History:  Procedure Laterality Date   Excision of lipoma     PROSTATE BIOPSY Bilateral 03/10/2012   surgery to reduce turbinate and straighten deviated septum  2016   TONSILLECTOMY     Social History   Occupational History   Occupation: retired    Fish farm manager: RETIRED  Tobacco Use   Smoking status: Former    Types: Cigarettes    Quit date: 02/23/1994    Years since quitting: 27.5   Smokeless tobacco: Former  Scientific laboratory technician Use: Never used  Substance and Sexual Activity   Alcohol use: Not Currently    Comment: d/c beer about 5 months ago   Drug use: No   Sexual activity: Yes    Partners: Female

## 2021-10-13 ENCOUNTER — Encounter: Payer: Self-pay | Admitting: Orthopedic Surgery

## 2021-10-13 ENCOUNTER — Ambulatory Visit: Payer: Medicare Other | Admitting: Orthopedic Surgery

## 2021-10-13 DIAGNOSIS — B351 Tinea unguium: Secondary | ICD-10-CM | POA: Diagnosis not present

## 2021-10-13 DIAGNOSIS — L97511 Non-pressure chronic ulcer of other part of right foot limited to breakdown of skin: Secondary | ICD-10-CM | POA: Diagnosis not present

## 2021-10-13 NOTE — Progress Notes (Signed)
Office Visit Note   Patient: Stephen Meza           Date of Birth: [birth year]           MRN: [id number] Visit Date: 10/13/2021              Requested by: Biagio Borg, MD [address] [place],  [place] [address] PCP: Biagio Borg, MD  Chief Complaint  Patient presents with   Right Foot - Follow-up    Right foot 5th MT ulcer       HPI: Patient is an 88 year old gentleman who presents in follow-up for Wagner grade 1 ulcer fifth metatarsal head right foot and onychomycotic nails x10.  Patient is unable to safely trim the nails on his own.  Assessment & Plan: Visit Diagnoses:  1. Non-pressure chronic ulcer of other part of right foot limited to breakdown of skin ([place])   2. Onychomycosis     Plan: Nails were trimmed x10 ulcer debrided x1.  Follow-Up Instructions: Return in about 3 months (around 01/13/2022).   Ortho Exam  Patient is alert, oriented, no adenopathy, well-dressed, normal affect, normal respiratory effort. Examination patient has thickened discolored onychomycotic nails x10 there is no signs of paronychial infection.  After informed consent the nails were trimmed P80 without complication.  Patient has a Wagner grade 1 ulcer beneath the fifth metatarsal head of the right foot.  After informed consent a 10 blade knife was used to debride the skin and soft tissue back to healthy viable tissue.  After debridement the ulcer was 2 cm in diameter and 1 mm deep.  There is no signs of infection no exposed bone or tendon.  Imaging: No results found. No images are attached to the encounter.  Labs: Lab Results  Component Value Date   HGBA1C 5.9 04/22/2021   HGBA1C 5.9 11/01/2020   HGBA1C 5.4 04/04/2018   ESRSEDRATE 19 08/01/2018   LABURIC 5.0 11/28/2012   LABURIC 6.4 03/10/2011   LABURIC 6.8 02/12/2010     Lab Results  Component Value Date   ALBUMIN 4.2 04/22/2021   ALBUMIN 4.3 11/01/2020   ALBUMIN 4.4 04/08/2020    No results found for:  "MG" Lab Results  Component Value Date   VD25OH 47.18 04/22/2021   VD25OH 50.24 11/01/2020   VD25OH 20.78 (L) 04/06/2019    No results found for: "PREALBUMIN"    Latest Ref Rng & Units 04/22/2021   12:10 PM 04/08/2020    2:56 PM 04/06/2019    3:20 PM  CBC EXTENDED  WBC 4.0 - 10.5 K/uL 4.4  6.1  5.9   RBC 4.22 - 5.81 Mil/uL 4.21  4.41  4.51   Hemoglobin 13.0 - 17.0 g/dL 12.1  12.9  13.1   HCT 39.0 - 52.0 % 36.6  38.5  40.1   Platelets 150.0 - 400.0 K/uL 185.0  202.0  197.0   NEUT# 1.4 - 7.7 K/uL 2.6  4.0  3.8   Lymph# 0.7 - 4.0 K/uL 1.2  1.5  1.4      There is no height or weight on file to calculate BMI.  Orders:  No orders of the defined types were placed in this encounter.  No orders of the defined types were placed in this encounter.    Procedures: No procedures performed  Clinical Data: No additional findings.  ROS:  All other systems negative, except as noted in the HPI. Review of Systems  Objective: Vital Signs: There were no vitals  taken for this visit.  Specialty Comments:  No specialty comments available.  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   Low grade fever 04/06/2019   Bilateral leg pain 04/04/2018   Bilateral bunions 04/04/2018   Ganglion cyst of dorsum of right wrist 04/04/2018   Chronic low back pain 04/04/2018   Gait disorder 04/04/2018   Recurrent falls while walking 04/04/2018   Abrasion of knee, bilateral 04/04/2018   Bladder cancer ([place]) 03/19/2016   Esophageal stricture 03/19/2016   Retinal tear of right eye 03/19/2016   Dizziness 03/19/2016   Hyperglycemia 03/16/2015   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   Prostate cancer ([place]) 04/13/2012   Hyperlipidemia with target LDL less than 130  04/12/2012   Encounter for well adult exam with abnormal findings 03/11/2011   TOBACCO ABUSE, HX OF 02/12/2010   SHOULDER PAIN 10/10/2007   Vitamin B12 deficiency 07/14/2007   PODAGRA 07/14/2007   ERECTILE DYSFUNCTION 07/14/2007   TREMOR, ESSENTIAL 64/40/3474   Alcoholic peripheral neuropathy ([place]) 07/14/2007   Essential hypertension 07/14/2007   PERIPHERAL VASCULAR DISEASE 07/14/2007   EXTERNAL HEMORRHOIDS 07/14/2007   ESOPHAGITIS 07/14/2007   GERD 07/14/2007   TONSILLECTOMY, HX OF 07/14/2007   Past Medical History:  Diagnosis Date   DUODENITIS, WITHOUT HEMORRHAGE 07/14/2007   ERECTILE DYSFUNCTION 07/14/2007   Esophageal stricture 03/19/2016   ESOPHAGITIS 07/14/2007   EXTERNAL HEMORRHOIDS 07/14/2007   GERD 07/14/2007   Gout    HYPERTENSION 07/14/2007   PERIPHERAL NEUROPATHY 07/14/2007   Peripheral neuropathy 04/06/2019   PERIPHERAL VASCULAR DISEASE 07/14/2007   PODAGRA 07/14/2007   Prostate cancer ([place]) 02/2012   pt had screening and then biopsy   Retinal tear of right eye 03/19/2016   TOBACCO ABUSE, HX OF 02/12/2010   TONSILLECTOMY, HX OF 07/14/2007   TREMOR, ESSENTIAL 07/14/2007   VITAMIN B12 DEFICIENCY 07/14/2007    Family History  Problem Relation Age of Onset   Hypothyroidism Daughter    Breast cancer Other    Cancer Mother    Other Father    Breast cancer Sister     Past Surgical History:  Procedure Laterality Date   Excision of lipoma     PROSTATE BIOPSY Bilateral 03/10/2012   surgery to reduce turbinate and straighten deviated septum  2016   TONSILLECTOMY     Social History   Occupational History   Occupation: retired    Fish farm manager: RETIRED  Tobacco Use   Smoking status: Former    Types: Cigarettes    Quit date: 02/23/1994    Years since quitting: 27.6   Smokeless tobacco: Former  Scientific laboratory technician Use: Never used  Substance and Sexual Activity   Alcohol use: Not Currently    Comment: d/c beer about 5 months ago   Drug use: No   Sexual activity: Yes     Partners: Female

## 2021-10-28 DIAGNOSIS — Z85828 Personal history of other malignant neoplasm of skin: Secondary | ICD-10-CM | POA: Diagnosis not present

## 2021-10-28 DIAGNOSIS — C44222 Squamous cell carcinoma of skin of right ear and external auricular canal: Secondary | ICD-10-CM | POA: Diagnosis not present

## 2021-10-28 DIAGNOSIS — L820 Inflamed seborrheic keratosis: Secondary | ICD-10-CM | POA: Diagnosis not present

## 2021-10-28 DIAGNOSIS — H61002 Unspecified perichondritis of left external ear: Secondary | ICD-10-CM | POA: Diagnosis not present

## 2021-10-28 DIAGNOSIS — D485 Neoplasm of uncertain behavior of skin: Secondary | ICD-10-CM | POA: Diagnosis not present

## 2021-11-07 ENCOUNTER — Encounter: Payer: Self-pay | Admitting: Internal Medicine

## 2021-11-07 ENCOUNTER — Ambulatory Visit (INDEPENDENT_AMBULATORY_CARE_PROVIDER_SITE_OTHER): Payer: Medicare Other | Admitting: Internal Medicine

## 2021-11-07 VITALS — BP 148/94 | HR 64 | Temp 97.6°F | Ht 72.0 in | Wt 175.0 lb

## 2021-11-07 DIAGNOSIS — E559 Vitamin D deficiency, unspecified: Secondary | ICD-10-CM | POA: Diagnosis not present

## 2021-11-07 DIAGNOSIS — R739 Hyperglycemia, unspecified: Secondary | ICD-10-CM

## 2021-11-07 DIAGNOSIS — E538 Deficiency of other specified B group vitamins: Secondary | ICD-10-CM | POA: Diagnosis not present

## 2021-11-07 DIAGNOSIS — Z23 Encounter for immunization: Secondary | ICD-10-CM | POA: Diagnosis not present

## 2021-11-07 DIAGNOSIS — E785 Hyperlipidemia, unspecified: Secondary | ICD-10-CM | POA: Diagnosis not present

## 2021-11-07 DIAGNOSIS — I10 Essential (primary) hypertension: Secondary | ICD-10-CM | POA: Diagnosis not present

## 2021-11-07 LAB — LIPID PANEL
Cholesterol: 210 mg/dL — ABNORMAL HIGH (ref 0–200)
HDL: 54.4 mg/dL (ref >39.00)
LDL Cholesterol: 133 mg/dL — ABNORMAL HIGH (ref 0–99)
NonHDL: 155.3
Total CHOL/HDL Ratio: 4
Triglycerides: 114 mg/dL (ref 0.0–149.0)
VLDL: 22.8 mg/dL (ref 0.0–40.0)

## 2021-11-07 LAB — BASIC METABOLIC PANEL
BUN: 19 mg/dL (ref 6–23)
CO2: 30 mEq/L (ref 19–32)
Calcium: 9.7 mg/dL (ref 8.4–10.5)
Chloride: 102 mEq/L (ref 96–112)
Creatinine, Ser: 1.01 mg/dL (ref 0.40–1.50)
GFR: 67.4 mL/min (ref >60.00)
Glucose, Bld: 92 mg/dL (ref 70–99)
Potassium: 4.8 mEq/L (ref 3.5–5.1)
Sodium: 137 mEq/L (ref 135–145)

## 2021-11-07 LAB — CBC WITH DIFFERENTIAL/PLATELET
Basophils Absolute: 0.1 10*3/uL (ref 0.0–0.1)
Basophils Relative: 1.1 % (ref 0.0–3.0)
Eosinophils Absolute: 0.1 10*3/uL (ref 0.0–0.7)
Eosinophils Relative: 1.7 % (ref 0.0–5.0)
HCT: 38.2 % — ABNORMAL LOW (ref 39.0–52.0)
Hemoglobin: 12.5 g/dL — ABNORMAL LOW (ref 13.0–17.0)
Lymphocytes Relative: 21 % (ref 12.0–46.0)
Lymphs Abs: 1.2 10*3/uL (ref 0.7–4.0)
MCHC: 32.9 g/dL (ref 30.0–36.0)
MCV: 89.3 fl (ref 78.0–100.0)
Monocytes Absolute: 0.5 10*3/uL (ref 0.1–1.0)
Monocytes Relative: 8.7 % (ref 3.0–12.0)
Neutro Abs: 4 10*3/uL (ref 1.4–7.7)
Neutrophils Relative %: 67.5 % (ref 43.0–77.0)
Platelets: 205 10*3/uL (ref 150.0–400.0)
RBC: 4.27 Mil/uL (ref 4.22–5.81)
RDW: 15.3 % (ref 11.5–15.5)
WBC: 5.9 10*3/uL (ref 4.0–10.5)

## 2021-11-07 LAB — HEPATIC FUNCTION PANEL
ALT: 7 U/L (ref 0–53)
AST: 15 U/L (ref 0–37)
Albumin: 4.2 g/dL (ref 3.5–5.2)
Alkaline Phosphatase: 81 U/L (ref 39–117)
Bilirubin, Direct: 0.1 mg/dL (ref 0.0–0.3)
Total Bilirubin: 0.5 mg/dL (ref 0.2–1.2)
Total Protein: 7.5 g/dL (ref 6.0–8.3)

## 2021-11-07 LAB — HEMOGLOBIN A1C: Hgb A1c MFr Bld: 6 % (ref 4.6–6.5)

## 2021-11-07 NOTE — Patient Instructions (Addendum)
Please have your RSV shot done at your local pharmacy.  You had the flu shot today  Please continue all other medications as before, and refills have been done if requested.  Please have the pharmacy call with any other refills you may need.  Please continue your efforts at being more active, low cholesterol diet, and weight control.  You are otherwise up to date with prevention measures today.  Please keep your appointments with your specialists as you may have planned  Please go to the LAB at the blood drawing area for the tests to be done  You will be contacted by phone if any changes need to be made immediately.  Otherwise, you will receive a letter about your results with an explanation, but please check with MyChart first.  Please remember to sign up for MyChart if you have not done so, as this will be important to you in the future with finding out test results, communicating by private email, and scheduling acute appointments online when needed.  Please make an Appointment to return in 6 months, or sooner if needed, also with Lab Appointment for testing done 3-5 days before at the [facility] (so this is for TWO appointments - please see the scheduling desk as you leave)

## 2021-11-07 NOTE — Progress Notes (Unsigned)
Patient ID: Stephen Meza, male   DOB: [birth year], 88 y.o.   MRN: [id number]        Chief Complaint: follow up HTN, HLD and hyperglycemia ***       HPI:  Stephen Meza is a 88 y.o. male here with c/o        Wt Readings from Last 3 Encounters:  11/07/21 175 lb (79.4 kg)  08/13/21 178 lb 12.8 oz (81.1 kg)  05/02/21 183 lb (83 kg)   BP Readings from Last 3 Encounters:  11/07/21 (!) 148/94  08/13/21 118/78  05/02/21 136/80         Past Medical History:  Diagnosis Date   DUODENITIS, WITHOUT HEMORRHAGE 07/14/2007   ERECTILE DYSFUNCTION 07/14/2007   Esophageal stricture 03/19/2016   ESOPHAGITIS 07/14/2007   EXTERNAL HEMORRHOIDS 07/14/2007   GERD 07/14/2007   Gout    HYPERTENSION 07/14/2007   PERIPHERAL NEUROPATHY 07/14/2007   Peripheral neuropathy 04/06/2019   PERIPHERAL VASCULAR DISEASE 07/14/2007   PODAGRA 07/14/2007   Prostate cancer ([place]) 02/2012   pt had screening and then biopsy   Retinal tear of right eye 03/19/2016   TOBACCO ABUSE, HX OF 02/12/2010   TONSILLECTOMY, HX OF 07/14/2007   TREMOR, ESSENTIAL 07/14/2007   VITAMIN B12 DEFICIENCY 07/14/2007   Past Surgical History:  Procedure Laterality Date   Excision of lipoma     PROSTATE BIOPSY Bilateral 03/10/2012   surgery to reduce turbinate and straighten deviated septum  2016   TONSILLECTOMY      reports that he quit smoking about 27 years ago. His smoking use included cigarettes. He has quit using smokeless tobacco. He reports that he does not currently use alcohol. He reports that he does not use drugs. family history includes Breast cancer in his sister and another family member; Cancer in his mother; Hypothyroidism in his daughter; Other in his father. Allergies  Allergen Reactions   Nitrofuran Derivatives Rash   Ciprofloxacin Hcl Other (See Comments)    numbness   Current Outpatient Medications on File Prior to Visit  Medication Sig Dispense Refill   allopurinol (ZYLOPRIM) 100 MG tablet Take 1 tablet (100 mg  total) by mouth daily. 90 tablet 4   amLODipine (NORVASC) 2.5 MG tablet TAKE 1 TABLET BY MOUTH EVERY DAY 90 tablet 3   Cyanocobalamin (B-12) 3000 MCG CAPS Take by mouth daily.     docusate sodium (COLACE) 100 MG capsule Take 100 mg by mouth once.     omeprazole (PRILOSEC) 40 MG capsule TAKE 1 CAPSULE BY MOUTH EVERY DAY 90 capsule 2   atorvastatin (LIPITOR) 20 MG tablet Take 1 tablet (20 mg total) by mouth daily. (Patient not taking: Reported on 05/02/2021) 90 tablet 3   ezetimibe (ZETIA) 10 MG tablet Take 1 tablet (10 mg total) by mouth daily. (Patient not taking: Reported on 08/13/2021) 90 tablet 3   No current facility-administered medications on file prior to visit.        ROS:  All others reviewed and negative.  Objective        PE:  BP (!) 148/94 (BP Location: Left Arm, Patient Position: Sitting, Cuff Size: Large)   Pulse 64   Temp 97.6 F (36.4 C) (Temporal)   Ht 6' (1.829 m)   Wt 175 lb (79.4 kg)   SpO2 99%   BMI 23.73 kg/m                 Constitutional: Pt appears in NAD  HENT: Head: NCAT.                Right Ear: External ear normal.                 Left Ear: External ear normal.                Eyes: . Pupils are equal, round, and reactive to light. Conjunctivae and EOM are normal               Nose: without d/c or deformity               Neck: Neck supple. Gross normal ROM               Cardiovascular: Normal rate and regular rhythm.                 Pulmonary/Chest: Effort normal and breath sounds without rales or wheezing.                Abd:  Soft, NT, ND, + BS, no organomegaly               Neurological: Pt is alert. At baseline orientation, motor grossly intact               Skin: Skin is warm. No rashes, no other new lesions, LE edema - ***               Psychiatric: Pt behavior is normal without agitation   Micro: none  Cardiac tracings I have personally interpreted today:  none  Pertinent Radiological findings (summarize): none   Lab Results   Component Value Date   WBC 4.4 04/22/2021   HGB 12.1 (L) 04/22/2021   HCT 36.6 (L) 04/22/2021   PLT 185.0 04/22/2021   GLUCOSE 102 (H) 04/22/2021   CHOL 202 (H) 04/22/2021   TRIG 113.0 04/22/2021   HDL 44.60 04/22/2021   LDLDIRECT 93.0 03/15/2014   LDLCALC 135 (H) 04/22/2021   ALT 7 04/22/2021   AST 14 04/22/2021   NA 138 04/22/2021   K 4.6 04/22/2021   CL 102 04/22/2021   CREATININE 1.19 04/22/2021   BUN 19 04/22/2021   CO2 28 04/22/2021   TSH 4.23 04/22/2021   PSA 3.54 02/12/2010   HGBA1C 5.9 04/22/2021   Assessment/Plan:  Stephen Meza is a 31 y.o. White or Caucasian [1] male with  has a past medical history of DUODENITIS, WITHOUT HEMORRHAGE (07/14/2007), ERECTILE DYSFUNCTION (07/14/2007), Esophageal stricture (03/19/2016), ESOPHAGITIS (07/14/2007), EXTERNAL HEMORRHOIDS (07/14/2007), GERD (07/14/2007), Gout, HYPERTENSION (07/14/2007), PERIPHERAL NEUROPATHY (07/14/2007), Peripheral neuropathy (04/06/2019), PERIPHERAL VASCULAR DISEASE (07/14/2007), PODAGRA (07/14/2007), Prostate cancer ([place]) (02/2012), Retinal tear of right eye (03/19/2016), TOBACCO ABUSE, HX OF (02/12/2010), TONSILLECTOMY, HX OF (07/14/2007), TREMOR, ESSENTIAL (07/14/2007), and VITAMIN B12 DEFICIENCY (07/14/2007).  No problem-specific Assessment & Plan notes found for this encounter.  Followup: No follow-ups on file.  Cathlean Cower, MD 11/07/2021 3:44 PM [facility]

## 2021-11-09 ENCOUNTER — Encounter: Payer: Self-pay | Admitting: Internal Medicine

## 2021-11-09 NOTE — Assessment & Plan Note (Signed)
BP Readings from Last 3 Encounters:  11/07/21 (!) 148/94  08/13/21 118/78  05/02/21 136/80   Uncontrolled, pt encouraged to take meds as prescribed without regard to food, pt to continue medical treatment norvasc 2.5 mg qd

## 2021-11-09 NOTE — Assessment & Plan Note (Signed)
Lab Results  Component Value Date   LDLCALC 133 (H) 11/07/2021   Uncontrolled, , pt decliens statin, for lower chol diet

## 2021-11-09 NOTE — Assessment & Plan Note (Signed)
Lab Results  Component Value Date   HGBA1C 6.0 11/07/2021   Stable, pt to continue current medical treatment  - diet, wt control, excercise

## 2021-11-13 ENCOUNTER — Telehealth: Payer: Self-pay

## 2021-11-13 NOTE — Telephone Encounter (Addendum)
Spoke with patient regarding lab results. 

## 2021-11-13 NOTE — Telephone Encounter (Signed)
Patient's wife is calling in wondering why Stephen Meza' lab results are not back yet. Also asking if he should get the newest covid vaccine.

## 2021-11-17 ENCOUNTER — Telehealth: Payer: Self-pay

## 2021-11-17 NOTE — Telephone Encounter (Signed)
Patient's wife is calling in asking for a call back about lab results, and states she was advised that the results were mailed out on the 15th but she has not gotten anything.

## 2022-01-12 ENCOUNTER — Ambulatory Visit: Payer: Medicare Other | Admitting: Orthopedic Surgery

## 2022-01-13 ENCOUNTER — Ambulatory Visit: Payer: Medicare Other | Admitting: Orthopedic Surgery

## 2022-01-13 DIAGNOSIS — B351 Tinea unguium: Secondary | ICD-10-CM

## 2022-01-13 DIAGNOSIS — L97511 Non-pressure chronic ulcer of other part of right foot limited to breakdown of skin: Secondary | ICD-10-CM

## 2022-01-16 ENCOUNTER — Encounter: Payer: Self-pay | Admitting: Orthopedic Surgery

## 2022-01-16 NOTE — Progress Notes (Signed)
Office Visit Note   Patient: Stephen Meza           Date of Birth: [birth year]           MRN: [id number] Visit Date: 01/13/2022              Requested by: Biagio Borg, MD [address] [place],  [place] [address] PCP: Biagio Borg, MD  Chief Complaint  Patient presents with   Right Foot - Wound Check    Right foot 5th MT ulcer       HPI: Patient is an 88 year old gentleman who presents in follow-up for [facility] grade 1 ulcer plantar aspect fifth metatarsal head right foot as well as onychomycotic nails x 10.  Assessment & Plan: Visit Diagnoses:  1. Non-pressure chronic ulcer of other part of right foot limited to breakdown of skin ([place])   2. Onychomycosis     Plan: Nails were trimmed x 10 ulcer debrided x 1.  Follow-Up Instructions: Return in about 3 months (around 04/15/2022).   Ortho Exam  Patient is alert, oriented, no adenopathy, well-dressed, normal affect, normal respiratory effort. Examination patient has thickened discolored onychomycotic nails x 10 he is unable to safely trim the nails on his own and the nails were trimmed x 10 without complication.  Patient has a Wagner grade 1 ulcer beneath the right fifth metatarsal head.  After informed consent a 10 blade knife was used to debride the skin and soft tissue back to healthy viable granulation tissue.  After debridement the ulcer is 10 mm in diameter and 1 mm deep.  Imaging: No results found. No images are attached to the encounter.  Labs: Lab Results  Component Value Date   HGBA1C 6.0 11/07/2021   HGBA1C 5.9 04/22/2021   HGBA1C 5.9 11/01/2020   ESRSEDRATE 19 08/01/2018   LABURIC 5.0 11/28/2012   LABURIC 6.4 03/10/2011   LABURIC 6.8 02/12/2010     Lab Results  Component Value Date   ALBUMIN 4.2 11/07/2021   ALBUMIN 4.2 04/22/2021   ALBUMIN 4.3 11/01/2020    No results found for: "MG" Lab Results  Component Value Date   VD25OH 47.18 04/22/2021   VD25OH 50.24 11/01/2020   VD25OH 20.78  (L) 04/06/2019    No results found for: "PREALBUMIN"    Latest Ref Rng & Units 11/07/2021    4:26 PM 04/22/2021   12:10 PM 04/08/2020    2:56 PM  CBC EXTENDED  WBC 4.0 - 10.5 K/uL 5.9  4.4  6.1   RBC 4.22 - 5.81 Mil/uL 4.27  4.21  4.41   Hemoglobin 13.0 - 17.0 g/dL 12.5  12.1  12.9   HCT 39.0 - 52.0 % 38.2  36.6  38.5   Platelets 150.0 - 400.0 K/uL 205.0  185.0  202.0   NEUT# 1.4 - 7.7 K/uL 4.0  2.6  4.0   Lymph# 0.7 - 4.0 K/uL 1.2  1.2  1.5      There is no height or weight on file to calculate BMI.  Orders:  No orders of the defined types were placed in this encounter.  No orders of the defined types were placed in this encounter.    Procedures: No procedures performed  Clinical Data: No additional findings.  ROS:  All other systems negative, except as noted in the HPI. Review of Systems  Objective: Vital Signs: There were no vitals taken for this visit.  Specialty Comments:  No specialty comments available.  PMFS History:  Patient Active Problem List   Diagnosis Date Noted   Anemia 05/03/2021   Tick bite, infected 08/07/2020   Irregular heart beats 04/08/2020   Peripheral neuropathy 04/06/2019   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   Low grade fever 04/06/2019   Bilateral leg pain 04/04/2018   Bilateral bunions 04/04/2018   Ganglion cyst of dorsum of right wrist 04/04/2018   Chronic low back pain 04/04/2018   Gait disorder 04/04/2018   Recurrent falls while walking 04/04/2018   Abrasion of knee, bilateral 04/04/2018   Bladder cancer ([place]) 03/19/2016   Esophageal stricture 03/19/2016   Retinal tear of right eye 03/19/2016   Dizziness 03/19/2016   Hyperglycemia 03/16/2015   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   Prostate cancer ([place]) 04/13/2012   Hyperlipidemia with target LDL less than 130 04/12/2012   Encounter for well adult exam with abnormal findings 03/11/2011   TOBACCO ABUSE, HX OF 02/12/2010    SHOULDER PAIN 10/10/2007   Vitamin B12 deficiency 07/14/2007   PODAGRA 07/14/2007   ERECTILE DYSFUNCTION 07/14/2007   TREMOR, ESSENTIAL 16/11/9602   Alcoholic peripheral neuropathy ([place]) 07/14/2007   Essential hypertension 07/14/2007   PERIPHERAL VASCULAR DISEASE 07/14/2007   EXTERNAL HEMORRHOIDS 07/14/2007   ESOPHAGITIS 07/14/2007   GERD 07/14/2007   TONSILLECTOMY, HX OF 07/14/2007   Past Medical History:  Diagnosis Date   DUODENITIS, WITHOUT HEMORRHAGE 07/14/2007   ERECTILE DYSFUNCTION 07/14/2007   Esophageal stricture 03/19/2016   ESOPHAGITIS 07/14/2007   EXTERNAL HEMORRHOIDS 07/14/2007   GERD 07/14/2007   Gout    HYPERTENSION 07/14/2007   PERIPHERAL NEUROPATHY 07/14/2007   Peripheral neuropathy 04/06/2019   PERIPHERAL VASCULAR DISEASE 07/14/2007   PODAGRA 07/14/2007   Prostate cancer ([place]) 02/2012   pt had screening and then biopsy   Retinal tear of right eye 03/19/2016   TOBACCO ABUSE, HX OF 02/12/2010   TONSILLECTOMY, HX OF 07/14/2007   TREMOR, ESSENTIAL 07/14/2007   VITAMIN B12 DEFICIENCY 07/14/2007    Family History  Problem Relation Age of Onset   Hypothyroidism Daughter    Breast cancer Other    Cancer Mother    Other Father    Breast cancer Sister     Past Surgical History:  Procedure Laterality Date   Excision of lipoma     PROSTATE BIOPSY Bilateral 03/10/2012   surgery to reduce turbinate and straighten deviated septum  2016   TONSILLECTOMY     Social History   Occupational History   Occupation: retired    Fish farm manager: RETIRED  Tobacco Use   Smoking status: Former    Types: Cigarettes    Quit date: 02/23/1994    Years since quitting: 27.9   Smokeless tobacco: Former  Scientific laboratory technician Use: Never used  Substance and Sexual Activity   Alcohol use: Not Currently    Comment: d/c beer about 5 months ago   Drug use: No   Sexual activity: Yes    Partners: Female

## 2022-02-19 ENCOUNTER — Telehealth: Payer: Self-pay | Admitting: Orthopedic Surgery

## 2022-02-19 NOTE — Telephone Encounter (Signed)
SW martha she is bringing in paperwork on 02/24/22.

## 2022-02-19 NOTE — Telephone Encounter (Signed)
Pt's wife Stephen Meza stating Dr Sharol Given filled out a form when pt was sick to wait to go on [place] vacation. She states that they sent another form for Dr Sharol Given to fill out for the cruiseline. Pt's wife is asking if she can dropped form off next week and Dr Sharol Given fill it out  when he gets back from vacation and she will put in envelope for Dr Sharol Given to mail back to her when completed by the doctor. Pt phone number is [phone].

## 2022-04-14 ENCOUNTER — Ambulatory Visit: Payer: Medicare Other | Admitting: Orthopedic Surgery

## 2022-04-14 DIAGNOSIS — L97511 Non-pressure chronic ulcer of other part of right foot limited to breakdown of skin: Secondary | ICD-10-CM | POA: Diagnosis not present

## 2022-04-14 DIAGNOSIS — B351 Tinea unguium: Secondary | ICD-10-CM

## 2022-04-28 ENCOUNTER — Encounter: Payer: Self-pay | Admitting: Orthopedic Surgery

## 2022-04-28 NOTE — Progress Notes (Signed)
Office Visit Note   Patient: Stephen Meza           Date of Birth: 13-Aug-1933           MRN: [id number] Visit Date: 04/14/2022              Requested by: Biagio Borg, MD [address] [place],  [place] [address] PCP: Biagio Borg, MD  Chief Complaint  Patient presents with   Right Foot - Follow-up      HPI: Patient is an 89 year old gentleman who presents in follow-up for 2 separate issues.  Patient has had an ulcer beneath the fifth metatarsal head right foot and has had problems with onychomycotic nails which she is unable to safely trim on his own.  Assessment & Plan: Visit Diagnoses:  1. Non-pressure chronic ulcer of other part of right foot limited to breakdown of skin ([place])   2. Onychomycosis     Plan: Nails were trimmed x 10 without complications.  No open ulcers continue current protective shoe wear.  Follow-Up Instructions: Return in about 3 months (around 07/13/2022).   Ortho Exam  Patient is alert, oriented, no adenopathy, well-dressed, normal affect, normal respiratory effort. Examination the ulcers on the plantar aspect the right foot have healed there is no cellulitis no signs of infection.  Patient has thickened discolored onychomycotic nails x 10 that is unable to safely trim on his own and the nails were trimmed x 10 without complications.  Imaging: No results found. No images are attached to the encounter.  Labs: Lab Results  Component Value Date   HGBA1C 6.0 11/07/2021   HGBA1C 5.9 04/22/2021   HGBA1C 5.9 11/01/2020   ESRSEDRATE 19 08/01/2018   LABURIC 5.0 11/28/2012   LABURIC 6.4 03/10/2011   LABURIC 6.8 02/12/2010     Lab Results  Component Value Date   ALBUMIN 4.2 11/07/2021   ALBUMIN 4.2 04/22/2021   ALBUMIN 4.3 11/01/2020    No results found for: "MG" Lab Results  Component Value Date   VD25OH 47.18 04/22/2021   VD25OH 50.24 11/01/2020   VD25OH 20.78 (L) 04/06/2019    No results found for: "PREALBUMIN"     Latest Ref Rng & Units 11/07/2021    4:26 PM 04/22/2021   12:10 PM 04/08/2020    2:56 PM  CBC EXTENDED  WBC 4.0 - 10.5 K/uL 5.9  4.4  6.1   RBC 4.22 - 5.81 Mil/uL 4.27  4.21  4.41   Hemoglobin 13.0 - 17.0 g/dL 12.5  12.1  12.9   HCT 39.0 - 52.0 % 38.2  36.6  38.5   Platelets 150.0 - 400.0 K/uL 205.0  185.0  202.0   NEUT# 1.4 - 7.7 K/uL 4.0  2.6  4.0   Lymph# 0.7 - 4.0 K/uL 1.2  1.2  1.5      There is no height or weight on file to calculate BMI.  Orders:  No orders of the defined types were placed in this encounter.  No orders of the defined types were placed in this encounter.    Procedures: No procedures performed  Clinical Data: No additional findings.  ROS:  All other systems negative, except as noted in the HPI. Review of Systems  Objective: Vital Signs: There were no vitals taken for this visit.  Specialty Comments:  No specialty comments available.  PMFS History: Patient Active Problem List   Diagnosis Date Noted   Anemia 05/03/2021   Tick bite, infected 08/07/2020  Irregular heart beats 04/08/2020   Peripheral neuropathy 04/06/2019   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   Low grade fever 04/06/2019   Bilateral leg pain 04/04/2018   Bilateral bunions 04/04/2018   Ganglion cyst of dorsum of right wrist 04/04/2018   Chronic low back pain 04/04/2018   Gait disorder 04/04/2018   Recurrent falls while walking 04/04/2018   Abrasion of knee, bilateral 04/04/2018   Bladder cancer ([place]) 03/19/2016   Esophageal stricture 03/19/2016   Retinal tear of right eye 03/19/2016   Dizziness 03/19/2016   Hyperglycemia 03/16/2015   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   Prostate cancer ([place]) 04/13/2012   Hyperlipidemia with target LDL less than 130 04/12/2012   Encounter for well adult exam with abnormal findings 03/11/2011   TOBACCO ABUSE, HX OF 02/12/2010   SHOULDER PAIN 10/10/2007   Vitamin B12 deficiency 07/14/2007    PODAGRA 07/14/2007   ERECTILE DYSFUNCTION 07/14/2007   TREMOR, ESSENTIAL [id number]   Alcoholic peripheral neuropathy ([place]) 07/14/2007   Essential hypertension 07/14/2007   PERIPHERAL VASCULAR DISEASE 07/14/2007   EXTERNAL HEMORRHOIDS 07/14/2007   ESOPHAGITIS 07/14/2007   GERD 07/14/2007   TONSILLECTOMY, HX OF 07/14/2007   Past Medical History:  Diagnosis Date   DUODENITIS, WITHOUT HEMORRHAGE 07/14/2007   ERECTILE DYSFUNCTION 07/14/2007   Esophageal stricture 03/19/2016   ESOPHAGITIS 07/14/2007   EXTERNAL HEMORRHOIDS 07/14/2007   GERD 07/14/2007   Gout    HYPERTENSION 07/14/2007   PERIPHERAL NEUROPATHY 07/14/2007   Peripheral neuropathy 04/06/2019   PERIPHERAL VASCULAR DISEASE 07/14/2007   PODAGRA 07/14/2007   Prostate cancer ([place]) 02/2012   pt had screening and then biopsy   Retinal tear of right eye 03/19/2016   TOBACCO ABUSE, HX OF 02/12/2010   TONSILLECTOMY, HX OF 07/14/2007   TREMOR, ESSENTIAL 07/14/2007   VITAMIN B12 DEFICIENCY 07/14/2007    Family History  Problem Relation Age of Onset   Hypothyroidism Daughter    Breast cancer Other    Cancer Mother    Other Father    Breast cancer Sister     Past Surgical History:  Procedure Laterality Date   Excision of lipoma     PROSTATE BIOPSY Bilateral 03/10/2012   surgery to reduce turbinate and straighten deviated septum  2016   TONSILLECTOMY     Social History   Occupational History   Occupation: retired    Fish farm manager: RETIRED  Tobacco Use   Smoking status: Former    Types: Cigarettes    Quit date: 02/23/1994    Years since quitting: 28.1   Smokeless tobacco: Former  Scientific laboratory technician Use: Never used  Substance and Sexual Activity   Alcohol use: Not Currently    Comment: d/c beer about 5 months ago   Drug use: No   Sexual activity: Yes    Partners: Female

## 2022-05-08 ENCOUNTER — Other Ambulatory Visit (INDEPENDENT_AMBULATORY_CARE_PROVIDER_SITE_OTHER): Payer: Medicare Other

## 2022-05-08 DIAGNOSIS — E538 Deficiency of other specified B group vitamins: Secondary | ICD-10-CM | POA: Diagnosis not present

## 2022-05-08 DIAGNOSIS — E785 Hyperlipidemia, unspecified: Secondary | ICD-10-CM | POA: Diagnosis not present

## 2022-05-08 DIAGNOSIS — E559 Vitamin D deficiency, unspecified: Secondary | ICD-10-CM | POA: Diagnosis not present

## 2022-05-08 DIAGNOSIS — R739 Hyperglycemia, unspecified: Secondary | ICD-10-CM

## 2022-05-08 LAB — URINALYSIS, ROUTINE W REFLEX MICROSCOPIC
Bilirubin Urine: NEGATIVE
Hgb urine dipstick: NEGATIVE
Ketones, ur: NEGATIVE
Leukocytes,Ua: NEGATIVE
Nitrite: NEGATIVE
Specific Gravity, Urine: 1.02 (ref 1.000–1.030)
Total Protein, Urine: NEGATIVE
Urine Glucose: NEGATIVE
Urobilinogen, UA: 0.2 (ref 0.0–1.0)
pH: 6 (ref 5.0–8.0)

## 2022-05-08 LAB — CBC WITH DIFFERENTIAL/PLATELET
Basophils Absolute: 0 10*3/uL (ref 0.0–0.1)
Basophils Relative: 0.8 % (ref 0.0–3.0)
Eosinophils Absolute: 0.1 10*3/uL (ref 0.0–0.7)
Eosinophils Relative: 1.8 % (ref 0.0–5.0)
HCT: 36.4 % — ABNORMAL LOW (ref 39.0–52.0)
Hemoglobin: 11.9 g/dL — ABNORMAL LOW (ref 13.0–17.0)
Lymphocytes Relative: 20 % (ref 12.0–46.0)
Lymphs Abs: 1.2 10*3/uL (ref 0.7–4.0)
MCHC: 32.8 g/dL (ref 30.0–36.0)
MCV: 88.8 fl (ref 78.0–100.0)
Monocytes Absolute: 0.4 10*3/uL (ref 0.1–1.0)
Monocytes Relative: 7.6 % (ref 3.0–12.0)
Neutro Abs: 4.1 10*3/uL (ref 1.4–7.7)
Neutrophils Relative %: 69.8 % (ref 43.0–77.0)
Platelets: 214 10*3/uL (ref 150.0–400.0)
RBC: 4.1 Mil/uL — ABNORMAL LOW (ref 4.22–5.81)
RDW: 15.3 % (ref 11.5–15.5)
WBC: 5.8 10*3/uL (ref 4.0–10.5)

## 2022-05-08 LAB — LIPID PANEL
Cholesterol: 192 mg/dL (ref 0–200)
HDL: 49.3 mg/dL (ref >39.00)
LDL Cholesterol: 123 mg/dL — ABNORMAL HIGH (ref 0–99)
NonHDL: 142.93
Total CHOL/HDL Ratio: 4
Triglycerides: 98 mg/dL (ref 0.0–149.0)
VLDL: 19.6 mg/dL (ref 0.0–40.0)

## 2022-05-08 LAB — MICROALBUMIN / CREATININE URINE RATIO
Creatinine,U: 96.5 mg/dL
Microalb Creat Ratio: 0.9 mg/g (ref 0.0–30.0)
Microalb, Ur: 0.9 mg/dL (ref 0.0–1.9)

## 2022-05-08 LAB — BASIC METABOLIC PANEL
BUN: 18 mg/dL (ref 6–23)
CO2: 28 mEq/L (ref 19–32)
Calcium: 9.5 mg/dL (ref 8.4–10.5)
Chloride: 102 mEq/L (ref 96–112)
Creatinine, Ser: 0.88 mg/dL (ref 0.40–1.50)
GFR: 77.66 mL/min (ref >60.00)
Glucose, Bld: 93 mg/dL (ref 70–99)
Potassium: 4.9 mEq/L (ref 3.5–5.1)
Sodium: 139 mEq/L (ref 135–145)

## 2022-05-08 LAB — HEPATIC FUNCTION PANEL
ALT: 7 U/L (ref 0–53)
AST: 17 U/L (ref 0–37)
Albumin: 4.1 g/dL (ref 3.5–5.2)
Alkaline Phosphatase: 80 U/L (ref 39–117)
Bilirubin, Direct: 0.1 mg/dL (ref 0.0–0.3)
Total Bilirubin: 0.4 mg/dL (ref 0.2–1.2)
Total Protein: 7.3 g/dL (ref 6.0–8.3)

## 2022-05-08 LAB — VITAMIN D 25 HYDROXY (VIT D DEFICIENCY, FRACTURES): VITD: 46.83 ng/mL (ref 30.00–100.00)

## 2022-05-08 LAB — TSH: TSH: 3.09 u[IU]/mL (ref 0.35–5.50)

## 2022-05-08 LAB — VITAMIN B12: Vitamin B-12: 554 pg/mL (ref 211–911)

## 2022-05-08 LAB — HEMOGLOBIN A1C: Hgb A1c MFr Bld: 5.8 % (ref 4.6–6.5)

## 2022-05-11 ENCOUNTER — Ambulatory Visit: Payer: Medicare Other | Admitting: Internal Medicine

## 2022-05-12 ENCOUNTER — Ambulatory Visit (INDEPENDENT_AMBULATORY_CARE_PROVIDER_SITE_OTHER): Payer: Medicare Other | Admitting: Internal Medicine

## 2022-05-12 ENCOUNTER — Encounter: Payer: Self-pay | Admitting: Internal Medicine

## 2022-05-12 ENCOUNTER — Other Ambulatory Visit: Payer: Self-pay | Admitting: Internal Medicine

## 2022-05-12 VITALS — BP 136/82 | HR 78 | Temp 98.3°F | Ht 72.0 in | Wt 179.0 lb

## 2022-05-12 DIAGNOSIS — E785 Hyperlipidemia, unspecified: Secondary | ICD-10-CM | POA: Diagnosis not present

## 2022-05-12 DIAGNOSIS — E559 Vitamin D deficiency, unspecified: Secondary | ICD-10-CM | POA: Diagnosis not present

## 2022-05-12 DIAGNOSIS — R739 Hyperglycemia, unspecified: Secondary | ICD-10-CM

## 2022-05-12 DIAGNOSIS — I10 Essential (primary) hypertension: Secondary | ICD-10-CM | POA: Diagnosis not present

## 2022-05-12 DIAGNOSIS — D509 Iron deficiency anemia, unspecified: Secondary | ICD-10-CM

## 2022-05-12 DIAGNOSIS — D649 Anemia, unspecified: Secondary | ICD-10-CM

## 2022-05-12 DIAGNOSIS — Z0001 Encounter for general adult medical examination with abnormal findings: Secondary | ICD-10-CM

## 2022-05-12 DIAGNOSIS — E538 Deficiency of other specified B group vitamins: Secondary | ICD-10-CM | POA: Diagnosis not present

## 2022-05-12 DIAGNOSIS — G629 Polyneuropathy, unspecified: Secondary | ICD-10-CM | POA: Diagnosis not present

## 2022-05-12 LAB — IBC PANEL
Iron: 41 ug/dL — ABNORMAL LOW (ref 42–165)
Saturation Ratios: 13.8 % — ABNORMAL LOW (ref 20.0–50.0)
TIBC: 296.8 ug/dL (ref 250.0–450.0)
Transferrin: 212 mg/dL (ref 212.0–360.0)

## 2022-05-12 LAB — FERRITIN: Ferritin: 59.5 ng/mL (ref 22.0–322.0)

## 2022-05-12 MED ORDER — POLYSACCHARIDE IRON COMPLEX 150 MG PO CAPS: 150.0000 mg | ORAL_CAPSULE | Freq: Every day | ORAL | 0 refills | Status: DC

## 2022-05-12 MED ORDER — AMITRIPTYLINE HCL 100 MG PO TABS: ORAL_TABLET | ORAL | 1 refills | Status: DC

## 2022-05-12 MED ORDER — NEXLIZET 180-10 MG PO TABS: ORAL_TABLET | ORAL | 3 refills | Status: DC

## 2022-05-12 NOTE — Patient Instructions (Addendum)
Please take all new medication as prescribed - the elavil for leg pain at bedtime, and nexlizet for cholesterol  Please continue all other medications as before, and refills have been done if requested.  Please have the pharmacy call with any other refills you may need.  Please continue your efforts at being more active, low cholesterol diet, and weight control.  You are otherwise up to date with prevention measures today.  Please keep your appointments with your specialists as you may have planned  Please go to the LAB at the blood drawing area for the tests to be done - just the iron testing today  You will be contacted by phone if any changes need to be made immediately.  Otherwise, you will receive a letter about your results with an explanation, but please check with MyChart first.  Please remember to sign up for MyChart if you have not done so, as this will be important to you in the future with finding out test results, communicating by private email, and scheduling acute appointments online when needed.  Please make an Appointment to return in 6 months, or sooner if needed, also with Lab Appointment for testing done 3-5 days before at the [facility] (so this is for TWO appointments - please see the scheduling desk as you leave)

## 2022-05-12 NOTE — Progress Notes (Signed)
Chief Complaint:: wellness exam and anemia, hld, neuoropathy       HPI:  Stephen Meza is a 89 y.o. male here for wellness exam, plans to see eye doctor soon, declines covid booster, o/w up to date                        Also no recent overt bleeding.  Pt denies chest pain, increased sob or doe, wheezing, orthopnea, PND, increased LE swelling, palpitations, dizziness or syncope.   Pt denies polydipsia, polyuria, or new focal neuro s/s, except for worsening 3 mo bilateral LE numb tingling pain worse to try to get to sleep at night. .  Pt denies fever, wt loss, night sweats, loss of appetite, or other constitutional symptoms  Pt has been trying to follow lower chol diet.     Wt Readings from Last 3 Encounters:  05/12/22 179 lb (81.2 kg)  11/07/21 175 lb (79.4 kg)  08/13/21 178 lb 12.8 oz (81.1 kg)   BP Readings from Last 3 Encounters:  05/12/22 136/82  11/07/21 (!) 148/94  08/13/21 118/78   Immunization History  Administered Date(s) Administered   Fluad Quad(high Dose 65+) 10/27/2018, 11/27/2019, 11/01/2020, 11/07/2021   Influenza Whole 12/23/2006, 12/17/2008, 11/27/2011   Influenza, High Dose Seasonal PF 12/16/2017, 10/31/2018   Influenza-Unspecified 12/08/2014, 11/23/2016   PFIZER(Purple Top)SARS-COV-2 Vaccination 03/11/2019, 04/01/2019, 12/08/2019, 09/25/2020   Pneumococcal Conjugate-13 05/01/2013   Pneumococcal Polysaccharide-23 12/23/2006   Respiratory Syncytial Virus Vaccine,Recomb Aduvanted(Arexvy) 11/19/2021   Td 10/23/2008   Tdap 04/06/2019   Zoster Recombinat (Shingrix) 08/03/2017, 10/09/2017   Zoster, Live 12/23/2006   Health Maintenance Due  Topic Date Due   COVID-19 Vaccine (5 - 2023-24 season) 10/24/2021      Past Medical History:  Diagnosis Date   DUODENITIS, WITHOUT HEMORRHAGE 07/14/2007   ERECTILE DYSFUNCTION 07/14/2007   Esophageal stricture 03/19/2016   ESOPHAGITIS 07/14/2007   EXTERNAL HEMORRHOIDS 07/14/2007   GERD 07/14/2007   Gout     HYPERTENSION 07/14/2007   PERIPHERAL NEUROPATHY 07/14/2007   Peripheral neuropathy 04/06/2019   PERIPHERAL VASCULAR DISEASE 07/14/2007   PODAGRA 07/14/2007   Prostate cancer ([place]) 02/2012   pt had screening and then biopsy   Retinal tear of right eye 03/19/2016   TOBACCO ABUSE, HX OF 02/12/2010   TONSILLECTOMY, HX OF 07/14/2007   TREMOR, ESSENTIAL 07/14/2007   VITAMIN B12 DEFICIENCY 07/14/2007   Past Surgical History:  Procedure Laterality Date   Excision of lipoma     PROSTATE BIOPSY Bilateral 03/10/2012   surgery to reduce turbinate and straighten deviated septum  2016   TONSILLECTOMY      reports that he quit smoking about 28 years ago. His smoking use included cigarettes. He has quit using smokeless tobacco. He reports that he does not currently use alcohol. He reports that he does not use drugs. family history includes Breast cancer in his sister and another family member; Cancer in his mother; Hypothyroidism in his daughter; Other in his father. Allergies  Allergen Reactions   Nitrofuran Derivatives Rash   Ciprofloxacin Hcl Other (See Comments)    numbness   Current Outpatient Medications on File Prior to Visit  Medication Sig Dispense Refill   allopurinol (ZYLOPRIM) 100 MG tablet Take 1 tablet (100 mg total) by mouth daily. 90 tablet 4   amLODipine (NORVASC) 2.5 MG tablet TAKE 1 TABLET BY MOUTH EVERY DAY 90 tablet 3   ciclopirox (LOPROX) 0.77 % cream Apply topically  2 (two) times daily as needed.     Cyanocobalamin (B-12) 3000 MCG CAPS Take by mouth daily.     docusate sodium (COLACE) 100 MG capsule Take 100 mg by mouth once.     omeprazole (PRILOSEC) 40 MG capsule TAKE 1 CAPSULE BY MOUTH EVERY DAY 90 capsule 2   No current facility-administered medications on file prior to visit.        ROS:  All others reviewed and negative.  Objective        PE:  BP 136/82   Pulse 78   Temp 98.3 F (36.8 C) (Oral)   Ht 6' (1.829 m)   Wt 179 lb (81.2 kg)   SpO2 95%   BMI 24.28 kg/m                  Constitutional: Pt appears in NAD, walks with cane               HENT: Head: NCAT.                Right Ear: External ear normal.                 Left Ear: External ear normal.                Eyes: . Pupils are equal, round, and reactive to light. Conjunctivae and EOM are normal               Nose: without d/c or deformity               Neck: Neck supple. Gross normal ROM               Cardiovascular: Normal rate and regular rhythm.                 Pulmonary/Chest: Effort normal and breath sounds without rales or wheezing.                Abd:  Soft, NT, ND, + BS, no organomegaly               Neurological: Pt is alert. At baseline orientation, motor grossly intact               Skin: Skin is warm. No rashes, no other new lesions, LE edema - none               Psychiatric: Pt behavior is normal without agitation   Micro: none  Cardiac tracings I have personally interpreted today:  none  Pertinent Radiological findings (summarize): none   Lab Results  Component Value Date   WBC 5.8 05/08/2022   HGB 11.9 (L) 05/08/2022   HCT 36.4 (L) 05/08/2022   PLT 214.0 05/08/2022   GLUCOSE 93 05/08/2022   CHOL 192 05/08/2022   TRIG 98.0 05/08/2022   HDL 49.30 05/08/2022   LDLDIRECT 93.0 03/15/2014   LDLCALC 123 (H) 05/08/2022   ALT 7 05/08/2022   AST 17 05/08/2022   NA 139 05/08/2022   K 4.9 05/08/2022   CL 102 05/08/2022   CREATININE 0.88 05/08/2022   BUN 18 05/08/2022   CO2 28 05/08/2022   TSH 3.09 05/08/2022   PSA 3.54 02/12/2010   HGBA1C 5.8 05/08/2022   MICROALBUR 0.9 05/08/2022   Assessment/Plan:  Stephen Meza is a 80 y.o. White or Caucasian [1] male with  has a past medical history of DUODENITIS, WITHOUT HEMORRHAGE (07/14/2007), ERECTILE DYSFUNCTION (07/14/2007), Esophageal stricture (03/19/2016), ESOPHAGITIS (07/14/2007), EXTERNAL HEMORRHOIDS (  07/14/2007), GERD (07/14/2007), Gout, HYPERTENSION (07/14/2007), PERIPHERAL NEUROPATHY (07/14/2007), Peripheral  neuropathy (04/06/2019), PERIPHERAL VASCULAR DISEASE (07/14/2007), PODAGRA (07/14/2007), Prostate cancer ([place]) (02/2012), Retinal tear of right eye (03/19/2016), TOBACCO ABUSE, HX OF (02/12/2010), TONSILLECTOMY, HX OF (07/14/2007), TREMOR, ESSENTIAL (07/14/2007), and VITAMIN B12 DEFICIENCY (07/14/2007).  Encounter for well adult exam with abnormal findings Age and sex appropriate education and counseling updated with regular exercise and diet Referrals for preventative services - pt to call for eye exam soon Immunizations addressed - declines covid booster Smoking counseling  - none needed Evidence for depression or other mood disorder - none significant Most recent labs reviewed. I have personally reviewed and have noted: 1) the patient's medical and social history 2) The patient's current medications and supplements 3) The patient's height, weight, and BMI have been recorded in the chart   Anemia Mild worsening, no recent overt bleeding, for f/u iron level  Essential hypertension BP Readings from Last 3 Encounters:  05/12/22 136/82  11/07/21 (!) 148/94  08/13/21 118/78   Stable, pt to continue medical treatment norvasc 2.5 qd   Hyperglycemia Lab Results  Component Value Date   HGBA1C 5.8 05/08/2022   Stable, pt to continue current medical treatment  - diet, wt control   Hyperlipidemia with target LDL less than 130 Lab Results  Component Value Date   LDLCALC 123 (H) 05/08/2022   Uncontrolled, goal < 70 ldl, pt willing to start nexlizet 1 qd   Peripheral neuropathy With worsening pain, for start elavil 100 mg qhs prn  Vitamin B12 deficiency Lab Results  Component Value Date   VITAMINB12 554 05/08/2022   Stable, cont oral replacement - b12 1000 mcg qd  Followup: Return in about 6 months (around 11/12/2022).  Cathlean Cower, MD 05/16/2022 6:52 AM [facility]

## 2022-05-16 ENCOUNTER — Encounter: Payer: Self-pay | Admitting: Internal Medicine

## 2022-05-16 NOTE — Assessment & Plan Note (Signed)
With worsening pain, for start elavil 100 mg qhs prn

## 2022-05-16 NOTE — Assessment & Plan Note (Signed)
Mild worsening, no recent overt bleeding, for f/u iron level

## 2022-05-16 NOTE — Assessment & Plan Note (Signed)
Lab Results  Component Value Date   LDLCALC 123 (H) 05/08/2022   Uncontrolled, goal < 70 ldl, pt willing to start nexlizet 1 qd

## 2022-05-16 NOTE — Addendum Note (Signed)
Addended by: Biagio Borg on: 05/16/2022 06:54 AM   Modules accepted: Orders

## 2022-05-16 NOTE — Assessment & Plan Note (Signed)
Lab Results  Component Value Date   [id number] 05/08/2022   Stable, cont oral replacement - b12 1000 mcg qd

## 2022-05-16 NOTE — Assessment & Plan Note (Signed)
BP Readings from Last 3 Encounters:  05/12/22 136/82  11/07/21 (!) 148/94  08/13/21 118/78   Stable, pt to continue medical treatment norvasc 2.5 qd

## 2022-05-16 NOTE — Assessment & Plan Note (Signed)
Age and sex appropriate education and counseling updated with regular exercise and diet Referrals for preventative services - pt to call for eye exam soon Immunizations addressed - declines covid booster Smoking counseling  - none needed Evidence for depression or other mood disorder - none significant Most recent labs reviewed. I have personally reviewed and have noted: 1) the patient's medical and social history 2) The patient's current medications and supplements 3) The patient's height, weight, and BMI have been recorded in the chart

## 2022-05-16 NOTE — Assessment & Plan Note (Signed)
Lab Results  Component Value Date   HGBA1C 5.8 05/08/2022   Stable, pt to continue current medical treatment  - diet, wt control

## 2022-05-19 ENCOUNTER — Encounter: Payer: Self-pay | Admitting: Internal Medicine

## 2022-06-02 ENCOUNTER — Other Ambulatory Visit: Payer: Self-pay | Admitting: Orthopedic Surgery

## 2022-06-06 DIAGNOSIS — R0981 Nasal congestion: Secondary | ICD-10-CM | POA: Diagnosis not present

## 2022-06-06 DIAGNOSIS — R051 Acute cough: Secondary | ICD-10-CM | POA: Diagnosis not present

## 2022-06-06 DIAGNOSIS — R509 Fever, unspecified: Secondary | ICD-10-CM | POA: Diagnosis not present

## 2022-06-15 ENCOUNTER — Other Ambulatory Visit: Payer: Self-pay | Admitting: Internal Medicine

## 2022-06-17 DIAGNOSIS — B353 Tinea pedis: Secondary | ICD-10-CM | POA: Diagnosis not present

## 2022-06-17 DIAGNOSIS — L821 Other seborrheic keratosis: Secondary | ICD-10-CM | POA: Diagnosis not present

## 2022-06-17 DIAGNOSIS — D1801 Hemangioma of skin and subcutaneous tissue: Secondary | ICD-10-CM | POA: Diagnosis not present

## 2022-06-17 DIAGNOSIS — L820 Inflamed seborrheic keratosis: Secondary | ICD-10-CM | POA: Diagnosis not present

## 2022-06-17 DIAGNOSIS — L570 Actinic keratosis: Secondary | ICD-10-CM | POA: Diagnosis not present

## 2022-06-17 DIAGNOSIS — Z85828 Personal history of other malignant neoplasm of skin: Secondary | ICD-10-CM | POA: Diagnosis not present

## 2022-06-23 ENCOUNTER — Telehealth: Payer: Self-pay | Admitting: Internal Medicine

## 2022-06-23 NOTE — Telephone Encounter (Signed)
Contacted Stephen Meza to schedule their annual wellness visit. Appointment made for 07/07/2022.  [facility] Care Guide [facility] AWV TEAM Direct Dial: [phone]

## 2022-07-03 ENCOUNTER — Other Ambulatory Visit: Payer: Self-pay

## 2022-07-03 MED ORDER — OMEPRAZOLE 40 MG PO CPDR: DELAYED_RELEASE_CAPSULE | ORAL | 2 refills | Status: DC

## 2022-07-03 MED ORDER — AMLODIPINE BESYLATE 2.5 MG PO TABS: 2.5000 mg | ORAL_TABLET | Freq: Every day | ORAL | 3 refills | Status: DC

## 2022-07-14 ENCOUNTER — Ambulatory Visit: Payer: Medicare Other | Admitting: Orthopedic Surgery

## 2022-07-14 DIAGNOSIS — B351 Tinea unguium: Secondary | ICD-10-CM

## 2022-07-15 ENCOUNTER — Encounter: Payer: Self-pay | Admitting: Orthopedic Surgery

## 2022-07-15 NOTE — Progress Notes (Signed)
Office Visit Note   Patient: Stephen Meza           Date of Birth: 1933-12-05           MRN: [id number] Visit Date: 07/14/2022              Requested by: Corwin Levins, MD [address] [place],  [place] [address] PCP: Corwin Levins, MD  Chief Complaint  Patient presents with   Right Foot - Follow-up    Bilateral toenail trimming   Left Foot - Follow-up      HPI: Patient is an 89 year old gentleman who presents with painful onychomycotic nails x 10.  Patient also complains of a ulcer beneath the right fifth metatarsal head.  Assessment & Plan: Visit Diagnoses:  1. Onychomycosis     Plan: Callus was pared on the right fifth metatarsal head and nails were trimmed x 10.  Follow-Up Instructions: Return in about 3 months (around 10/14/2022).   Ortho Exam  Patient is alert, oriented, no adenopathy, well-dressed, normal affect, normal respiratory effort. Examination patient has a hypertrophic callus beneath the right fifth metatarsal head the callus was pared without complications.  He has thickened discolored onychomycotic nails that he is unable to safely trim on his own and the nails were trimmed x 10 without complication.  Imaging: No results found. No images are attached to the encounter.  Labs: Lab Results  Component Value Date   HGBA1C 5.8 05/08/2022   HGBA1C 6.0 11/07/2021   HGBA1C 5.9 04/22/2021   ESRSEDRATE 19 08/01/2018   LABURIC 5.0 11/28/2012   LABURIC 6.4 03/10/2011   LABURIC 6.8 02/12/2010     Lab Results  Component Value Date   ALBUMIN 4.1 05/08/2022   ALBUMIN 4.2 11/07/2021   ALBUMIN 4.2 04/22/2021    No results found for: "MG" Lab Results  Component Value Date   VD25OH 46.83 05/08/2022   VD25OH 47.18 04/22/2021   VD25OH 50.24 11/01/2020    No results found for: "PREALBUMIN"    Latest Ref Rng & Units 05/08/2022    1:08 PM 11/07/2021    4:26 PM 04/22/2021   12:10 PM  CBC EXTENDED  WBC 4.0 - 10.5 K/uL 5.8  5.9  4.4   RBC 4.22  - 5.81 Mil/uL 4.10  4.27  4.21   Hemoglobin 13.0 - 17.0 g/dL 62.1  30.8  65.7   HCT 39.0 - 52.0 % 36.4  38.2  36.6   Platelets 150.0 - 400.0 K/uL 214.0  205.0  185.0   NEUT# 1.4 - 7.7 K/uL 4.1  4.0  2.6   Lymph# 0.7 - 4.0 K/uL 1.2  1.2  1.2      There is no height or weight on file to calculate BMI.  Orders:  No orders of the defined types were placed in this encounter.  No orders of the defined types were placed in this encounter.    Procedures: No procedures performed  Clinical Data: No additional findings.  ROS:  All other systems negative, except as noted in the HPI. Review of Systems  Objective: Vital Signs: There were no vitals taken for this visit.  Specialty Comments:  No specialty comments available.  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   Low grade fever 04/06/2019   Bilateral leg  pain 04/04/2018   Bilateral bunions 04/04/2018   Ganglion cyst of dorsum of right wrist 04/04/2018   Chronic low back pain 04/04/2018   Gait disorder 04/04/2018   Recurrent falls while walking 04/04/2018   Abrasion of knee, bilateral 04/04/2018   Bladder cancer (HCC) 03/19/2016   Esophageal stricture 03/19/2016   Retinal tear of right eye 03/19/2016   Dizziness 03/19/2016   Hyperglycemia 03/16/2015   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   Prostate cancer (HCC) 04/13/2012   Hyperlipidemia with target LDL less than 130 04/12/2012   Encounter for well adult exam with abnormal findings 03/11/2011   TOBACCO ABUSE, HX OF 02/12/2010   SHOULDER PAIN 10/10/2007   Vitamin B12 deficiency 07/14/2007   PODAGRA 07/14/2007   ERECTILE DYSFUNCTION 07/14/2007   TREMOR, ESSENTIAL 07/14/2007   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   Essential  hypertension 07/14/2007   PERIPHERAL VASCULAR DISEASE 07/14/2007   EXTERNAL HEMORRHOIDS 07/14/2007   ESOPHAGITIS 07/14/2007   GERD 07/14/2007   TONSILLECTOMY, HX OF 07/14/2007   Past Medical History:  Diagnosis Date   DUODENITIS, WITHOUT HEMORRHAGE 07/14/2007   ERECTILE DYSFUNCTION 07/14/2007   Esophageal stricture 03/19/2016   ESOPHAGITIS 07/14/2007   EXTERNAL HEMORRHOIDS 07/14/2007   GERD 07/14/2007   Gout    HYPERTENSION 07/14/2007   PERIPHERAL NEUROPATHY 07/14/2007   Peripheral neuropathy 04/06/2019   PERIPHERAL VASCULAR DISEASE 07/14/2007   PODAGRA 07/14/2007   Prostate cancer (HCC) 02/2012   pt had screening and then biopsy   Retinal tear of right eye 03/19/2016   TOBACCO ABUSE, HX OF 02/12/2010   TONSILLECTOMY, HX OF 07/14/2007   TREMOR, ESSENTIAL 07/14/2007   VITAMIN B12 DEFICIENCY 07/14/2007    Family History  Problem Relation Age of Onset   Hypothyroidism Daughter    Breast cancer Other    Cancer Mother    Other Father    Breast cancer Sister     Past Surgical History:  Procedure Laterality Date   Excision of lipoma     PROSTATE BIOPSY Bilateral 03/10/2012   surgery to reduce turbinate and straighten deviated septum  2016   TONSILLECTOMY     Social History   Occupational History   Occupation: retired    Associate Professor: RETIRED  Tobacco Use   Smoking status: Former    Types: Cigarettes    Quit date: 02/23/1994    Years since quitting: 28.4   Smokeless tobacco: Former  Building services engineer Use: Never used  Substance and Sexual Activity   Alcohol use: Not Currently    Comment: d/c beer about 5 months ago   Drug use: No   Sexual activity: Yes    Partners: Female

## 2022-08-04 ENCOUNTER — Ambulatory Visit (INDEPENDENT_AMBULATORY_CARE_PROVIDER_SITE_OTHER): Payer: Medicare Other

## 2022-08-04 VITALS — BP 118/78 | HR 65 | Temp 98.4°F | Ht 72.0 in | Wt 175.4 lb

## 2022-08-04 DIAGNOSIS — Z0000 Encounter for general adult medical examination without abnormal findings: Secondary | ICD-10-CM

## 2022-08-04 NOTE — Patient Instructions (Signed)
[name] , Thank you for taking time to come for your Medicare Wellness Visit. I appreciate your ongoing commitment to your health goals. Please review the following plan we discussed and let me know if I can assist you in the future.   These are the goals we discussed:  Goals      My goal for 2024 is to stay alive and maintain my health.        This is a list of the screening recommended for you and due dates:  Health Maintenance  Topic Date Due   COVID-19 Vaccine (5 - 2023-24 season) 10/24/2021   Medicare Annual Wellness Visit  08/14/2022   Flu Shot  09/24/2022   DTaP/Tdap/Td vaccine (3 - Td or Tdap) 04/05/2029   Pneumonia Vaccine  Completed   Zoster (Shingles) Vaccine  Completed   HPV Vaccine  Aged Out    Advanced directives: Yes  Conditions/risks identified: Yes  Next appointment: Follow up in one year for your annual wellness visit.   Preventive Care 15 Years and Older, Male  Preventive care refers to lifestyle choices and visits with your health care provider that can promote health and wellness. What does preventive care include? A yearly physical exam. This is also called an annual well check. Dental exams once or twice a year. Routine eye exams. Ask your health care provider how often you should have your eyes checked. Personal lifestyle choices, including: Daily care of your teeth and gums. Regular physical activity. Eating a healthy diet. Avoiding tobacco and drug use. Limiting alcohol use. Practicing safe sex. Taking low doses of aspirin every day. Taking vitamin and mineral supplements as recommended by your health care provider. What happens during an annual well check? The services and screenings done by your health care provider during your annual well check will depend on your age, overall health, lifestyle risk factors, and family history of disease. Counseling  Your health care provider may ask you questions about your: Alcohol use. Tobacco  use. Drug use. Emotional well-being. Home and relationship well-being. Sexual activity. Eating habits. History of falls. Memory and ability to understand (cognition). Work and work Astronomer. Screening  You may have the following tests or measurements: Height, weight, and BMI. Blood pressure. Lipid and cholesterol levels. These may be checked every 5 years, or more frequently if you are over 59 years old. Skin check. Lung cancer screening. You may have this screening every year starting at age 44 if you have a 30-pack-year history of smoking and currently smoke or have quit within the past 15 years. Fecal occult blood test (FOBT) of the stool. You may have this test every year starting at age 22. Flexible sigmoidoscopy or colonoscopy. You may have a sigmoidoscopy every 5 years or a colonoscopy every 10 years starting at age 33. Prostate cancer screening. Recommendations will vary depending on your family history and other risks. Hepatitis C blood test. Hepatitis B blood test. Sexually transmitted disease (STD) testing. Diabetes screening. This is done by checking your blood sugar (glucose) after you have not eaten for a while (fasting). You may have this done every 1-3 years. Abdominal aortic aneurysm (AAA) screening. You may need this if you are a current or former smoker. Osteoporosis. You may be screened starting at age 52 if you are at high risk. Talk with your health care provider about your test results, treatment options, and if necessary, the need for more tests. Vaccines  Your health care provider may recommend certain vaccines, such  as: Influenza vaccine. This is recommended every year. Tetanus, diphtheria, and acellular pertussis (Tdap, Td) vaccine. You may need a Td booster every 10 years. Zoster vaccine. You may need this after age 39. Pneumococcal 13-valent conjugate (PCV13) vaccine. One dose is recommended after age 35. Pneumococcal polysaccharide (PPSV23) vaccine.  One dose is recommended after age 102. Talk to your health care provider about which screenings and vaccines you need and how often you need them. This information is not intended to replace advice given to you by your health care provider. Make sure you discuss any questions you have with your health care provider. Document Released: 03/08/2015 Document Revised: 10/30/2015 Document Reviewed: 12/11/2014 Elsevier Interactive Patient Education  2017 [employer].  Fall Prevention in the Home Falls can cause injuries. They can happen to people of all ages. There are many things you can do to make your home safe and to help prevent falls. What can I do on the outside of my home? Regularly fix the edges of walkways and driveways and fix any cracks. Remove anything that might make you trip as you walk through a door, such as a raised step or threshold. Trim any bushes or trees on the path to your home. Use bright outdoor lighting. Clear any walking paths of anything that might make someone trip, such as rocks or tools. Regularly check to see if handrails are loose or broken. Make sure that both sides of any steps have handrails. Any raised decks and porches should have guardrails on the edges. Have any leaves, snow, or ice cleared regularly. Use sand or salt on walking paths during winter. Clean up any spills in your garage right away. This includes oil or grease spills. What can I do in the bathroom? Use night lights. Install grab bars by the toilet and in the tub and shower. Do not use towel bars as grab bars. Use non-skid mats or decals in the tub or shower. If you need to sit down in the shower, use a plastic, non-slip stool. Keep the floor dry. Clean up any water that spills on the floor as soon as it happens. Remove soap buildup in the tub or shower regularly. Attach bath mats securely with double-sided non-slip rug tape. Do not have throw rugs and other things on the floor that can make  you trip. What can I do in the bedroom? Use night lights. Make sure that you have a light by your bed that is easy to reach. Do not use any sheets or blankets that are too big for your bed. They should not hang down onto the floor. Have a firm chair that has side arms. You can use this for support while you get dressed. Do not have throw rugs and other things on the floor that can make you trip. What can I do in the kitchen? Clean up any spills right away. Avoid walking on wet floors. Keep items that you use a lot in easy-to-reach places. If you need to reach something above you, use a strong step stool that has a grab bar. Keep electrical cords out of the way. Do not use floor polish or wax that makes floors slippery. If you must use wax, use non-skid floor wax. Do not have throw rugs and other things on the floor that can make you trip. What can I do with my stairs? Do not leave any items on the stairs. Make sure that there are handrails on both sides of the stairs and use  them. Fix handrails that are broken or loose. Make sure that handrails are as long as the stairways. Check any carpeting to make sure that it is firmly attached to the stairs. Fix any carpet that is loose or worn. Avoid having throw rugs at the top or bottom of the stairs. If you do have throw rugs, attach them to the floor with carpet tape. Make sure that you have a light switch at the top of the stairs and the bottom of the stairs. If you do not have them, ask someone to add them for you. What else can I do to help prevent falls? Wear shoes that: Do not have high heels. Have rubber bottoms. Are comfortable and fit you well. Are closed at the toe. Do not wear sandals. If you use a stepladder: Make sure that it is fully opened. Do not climb a closed stepladder. Make sure that both sides of the stepladder are locked into place. Ask someone to hold it for you, if possible. Clearly mark and make sure that you can  see: Any grab bars or handrails. First and last steps. Where the edge of each step is. Use tools that help you move around (mobility aids) if they are needed. These include: Canes. Walkers. Scooters. Crutches. Turn on the lights when you go into a dark area. Replace any light bulbs as soon as they burn out. Set up your furniture so you have a clear path. Avoid moving your furniture around. If any of your floors are uneven, fix them. If there are any pets around you, be aware of where they are. Review your medicines with your doctor. Some medicines can make you feel dizzy. This can increase your chance of falling. Ask your doctor what other things that you can do to help prevent falls. This information is not intended to replace advice given to you by your health care provider. Make sure you discuss any questions you have with your health care provider. Document Released: 12/06/2008 Document Revised: 07/18/2015 Document Reviewed: 03/16/2014 Elsevier Interactive Patient Education  2017 [employer].

## 2022-08-04 NOTE — Progress Notes (Cosign Needed Addendum)
Subjective:   Thorsten A. Kibbey is a 89 y.o. male who presents for Medicare Annual/Subsequent preventive examination.  Review of Systems     Cardiac Risk Factors include: advanced age (>33men, >68 women);hypertension;male gender     Objective:    Today's Vitals   08/04/22 1341  BP: 118/78  Pulse: 65  Temp: 98.4 F (36.9 C)  TempSrc: Temporal  SpO2: 99%  Weight: 175 lb 6.4 oz (79.6 kg)  Height: 6' (1.829 m)  PainSc: 0-No pain   Body mass index is 23.79 kg/m.     08/04/2022    2:26 PM 08/13/2021    2:09 PM 08/07/2020    1:27 PM 06/21/2019    3:57 PM  Advanced Directives  Does Patient Have a Medical Advance Directive? Yes Yes Yes Yes  Type of Estate agent of [place];Living will Living will;Healthcare Power of Attorney Living will;Healthcare Power of [name] Power of [place];Living will  Does patient want to make changes to medical advance directive?  No - Patient declined No - Patient declined No - Patient declined  Copy of Healthcare Power of Attorney in Chart? No - copy requested No - copy requested No - copy requested No - copy requested    Current Medications (verified) Outpatient Encounter Medications as of 08/04/2022  Medication Sig   allopurinol (ZYLOPRIM) 100 MG tablet TAKE 1 TABLET BY MOUTH EVERY DAY   amLODipine (NORVASC) 2.5 MG tablet Take 1 tablet (2.5 mg total) by mouth daily.   Cyanocobalamin (B-12) 3000 MCG CAPS Take by mouth daily.   docusate sodium (COLACE) 100 MG capsule Take 100 mg by mouth once.   gabapentin (NEURONTIN) 100 MG capsule Take 100 mg by mouth 3 (three) times daily.   omeprazole (PRILOSEC) 40 MG capsule TAKE 1 CAPSULE BY MOUTH EVERY DAY   amitriptyline (ELAVIL) 100 MG tablet Take 1/2 - 1 tab by mouth at bedtime (Patient not taking: Reported on 08/04/2022)   Bempedoic Acid-Ezetimibe (NEXLIZET) 180-10 MG TABS 1 tab by mouth once daily (Patient not taking: Reported on 08/04/2022)   ciclopirox (LOPROX) 0.77  % cream Apply topically 2 (two) times daily as needed. (Patient not taking: Reported on 08/04/2022)   iron polysaccharides (NU-IRON) 150 MG capsule Take 1 capsule (150 mg total) by mouth daily. (Patient not taking: Reported on 08/04/2022)   No facility-administered encounter medications on file as of 08/04/2022.    Allergies (verified) Nitrofuran derivatives and Ciprofloxacin hcl   History: Past Medical History:  Diagnosis Date   DUODENITIS, WITHOUT HEMORRHAGE 07/14/2007   ERECTILE DYSFUNCTION 07/14/2007   Esophageal stricture 03/19/2016   ESOPHAGITIS 07/14/2007   EXTERNAL HEMORRHOIDS 07/14/2007   GERD 07/14/2007   Gout    HYPERTENSION 07/14/2007   PERIPHERAL NEUROPATHY 07/14/2007   Peripheral neuropathy 04/06/2019   PERIPHERAL VASCULAR DISEASE 07/14/2007   PODAGRA 07/14/2007   Prostate cancer (HCC) 02/2012   pt had screening and then biopsy   Retinal tear of right eye 03/19/2016   TOBACCO ABUSE, HX OF 02/12/2010   TONSILLECTOMY, HX OF 07/14/2007   TREMOR, ESSENTIAL 07/14/2007   VITAMIN B12 DEFICIENCY 07/14/2007   Past Surgical History:  Procedure Laterality Date   Excision of lipoma     PROSTATE BIOPSY Bilateral 03/10/2012   surgery to reduce turbinate and straighten deviated septum  2016   TONSILLECTOMY     Family History  Problem Relation Age of Onset   Hypothyroidism Daughter    Breast cancer Other    Cancer Mother    Other Father  Breast cancer Sister    Social History   Socioeconomic History   Marital status: Married    Spouse name: Not on file   Number of children: 2   Years of education: 12   Highest education level: Not on file  Occupational History   Occupation: retired    Associate Professor: RETIRED  Tobacco Use   Smoking status: Former    Types: Cigarettes    Quit date: 02/23/1994    Years since quitting: 28.4   Smokeless tobacco: Former  Building services engineer Use: Never used  Substance and Sexual Activity   Alcohol use: Not Currently    Comment: d/c beer about 5  months ago   Drug use: No   Sexual activity: Yes    Partners: Female  Other Topics Concern   Not on file  Social History Narrative   HSG. Married '1958. 2 dtrs; 4 g-children. Work - retired. 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       Not disabled      Retired since 1986      Right handed      Two story home   Social Determinants of Health   Financial Resource Strain: Low Risk  (08/04/2022)   Overall Financial Resource Strain (CARDIA)    Difficulty of Paying Living Expenses: Not hard at all  Food Insecurity: No Food Insecurity (08/04/2022)   Hunger Vital Sign    Worried About Running Out of Food in the Last Year: Never true    Ran Out of Food in the Last Year: Never true  Transportation Needs: No Transportation Needs (08/04/2022)   PRAPARE - Administrator, Civil Service (Medical): No    Lack of Transportation (Non-Medical): No  Physical Activity: Sufficiently Active (08/04/2022)   Exercise Vital Sign    Days of Exercise per Week: 5 days    Minutes of Exercise per Session: 30 min  Stress: No Stress Concern Present (08/04/2022)   [employer] of Occupational Health - Occupational Stress Questionnaire    Feeling of Stress : Not at all  Social Connections: Socially Integrated (08/04/2022)   Social Connection and Isolation Panel [NHANES]    Frequency of Communication with Friends and Family: More than three times a week    Frequency of Social Gatherings with Friends and Family: More than three times a week    Attends Religious Services: More than 4 times per year    Active Member of [employer] or Organizations: Yes    Attends Engineer, structural: More than 4 times per year    Marital Status: Married    Tobacco Counseling Counseling given: Not Answered   Clinical Intake:  Pre-visit preparation completed: Yes  Pain : No/denies pain Pain Score: 0-No pain     BMI - recorded:  23.79 Nutritional Status: BMI of 19-24  Normal Nutritional Risks: None Diabetes: No  How often do you need to have someone help you when you read instructions, pamphlets, or other written materials from your doctor or pharmacy?: 1 - Never What is the last grade level you completed in school?: HSG  Diabetic? No  Interpreter Needed?: No  Information entered by :: Khia Dieterich N. Karle Desrosier, LPN.   Activities of Daily Living    08/04/2022    2:27 PM 08/13/2021    2:09 PM  In your present state of health, do you have any difficulty performing the following activities:  Hearing? 0 0  Vision? 0 0  Difficulty concentrating or making decisions? 0 0  Walking or climbing stairs? 0 0  Dressing or bathing? 0 0  Doing errands, shopping? 0 0  Preparing Food and eating ? N N  Using the Toilet? N N  In the past six months, have you accidently leaked urine? N N  Do you have problems with loss of bowel control? N N  Managing your Medications? N N  Managing your Finances? N N  Housekeeping or managing your Housekeeping? N N    Patient Care Team: Corwin Levins, MD as PCP - General (Internal Medicine) Albin Felling, OD as Consulting Physician (Optometry) Mia Creek, MD as Consulting Physician (Ophthalmology) Crista Elliot, MD as Consulting Physician (Urology)  Indicate any recent Medical Services you may have received from other than [facility] providers in the past year (date may be approximate).     Assessment:   This is a routine wellness examination for Jimmylee.  Hearing/Vision screen Hearing Screening - Comments:: Patient denied any hearing difficulties; no hearing aids. Vision Screening - Comments:: Wears rx glasses - up to date with routine eye exams with Coralyn Pear, OD.   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        Depression Screen    08/04/2022    2:25 PM 05/12/2022    1:13 PM 11/07/2021    3:29 PM 08/13/2021    2:08 PM 05/02/2021    2:10 PM 05/02/2021    1:57 PM 11/01/2020    2:48 PM  PHQ 2/9 Scores  PHQ - 2 Score 0 0 0 0 0 0 0  PHQ- 9 Score 0 0         Fall Risk    08/04/2022    2:27 PM 05/12/2022    1:14 PM 11/07/2021    3:29 PM 08/13/2021    2:00 PM 05/02/2021    2:10 PM  Fall Risk   Falls in the past year? 0 0 0 0 0  Number falls in past yr: 0 0 0 0 0  Injury with Fall? 0 0 0 0 0  Risk for fall due to : No Fall Risks No Fall Risks No Fall Risks No Fall Risks   Follow up Falls prevention discussed Falls evaluation completed;Education provided Falls evaluation completed Falls evaluation completed     FALL RISK PREVENTION PERTAINING TO THE HOME:  Any stairs in or around the home? Yes  If so, are there any without handrails? No  Home free of loose throw rugs in walkways, pet beds, electrical cords, etc? Yes  Adequate lighting in your home to reduce risk of falls? Yes   ASSISTIVE DEVICES UTILIZED TO PREVENT FALLS:  Life alert? No  Use of a cane, walker or w/c? Yes  Grab bars in the bathroom? No  Shower chair or bench in shower? Yes  Elevated toilet seat or a handicapped toilet? Yes   TIMED UP AND GO:  Was the test performed? Yes .  Length of time to ambulate 10 feet: 10 sec.   Gait steady  and fast with assistive device  Cognitive Function:      08/01/2018   11:00 AM  Montreal Cognitive Assessment   Visuospatial/ Executive (0/5) 3  Naming (0/3) 2  Attention: Read list of digits (0/2) 2  Attention: Read list of letters (0/1) 1  Attention: Serial 7 subtraction starting at 100 (0/3) 3  Language: Repeat phrase (0/2) 1  Language : Fluency (0/1) 0   Abstraction (0/2) 0  Delayed Recall (0/5) 0  Orientation (0/6) 5  Total 17  Adjusted Score (based on education) 18      08/04/2022    2:28 PM 08/13/2021    2:10 PM  6CIT Screen  What Year? 0 points 0 points  What month? 0 points 0 points  What time? 0 points 0 points  Count back from 20 0 points 0 points  Months in reverse 0 points 0 points  Repeat phrase 0 points 0 points  Total Score 0 points 0 points    Immunizations Immunization History  Administered Date(s) Administered   Fluad Quad(high Dose 65+) 10/27/2018, 11/27/2019, 11/01/2020, 11/07/2021   Influenza Whole 12/23/2006, 12/17/2008, 11/27/2011   Influenza, High Dose Seasonal PF 12/16/2017, 10/31/2018   Influenza-Unspecified 12/08/2014, 11/23/2016   PFIZER(Purple Top)SARS-COV-2 Vaccination 03/11/2019, 04/01/2019, 12/08/2019, 09/25/2020   Pneumococcal Conjugate-13 05/01/2013   Pneumococcal Polysaccharide-23 12/23/2006   Respiratory Syncytial Virus Vaccine,Recomb Aduvanted(Arexvy) 11/19/2021   Td 10/23/2008   Tdap 04/06/2019   Zoster Recombinat (Shingrix) 08/03/2017, 10/09/2017   Zoster, Live 12/23/2006    TDAP status: Up to date  Flu Vaccine status: Up to date  Pneumococcal vaccine status: Up to date  Covid-19 vaccine status: Completed vaccines  Qualifies for Shingles Vaccine? Yes   Zostavax completed Yes   Shingrix Completed?: Yes  Screening Tests Health Maintenance  Topic Date Due   COVID-19 Vaccine (5 - 2023-24 season) 10/24/2021   INFLUENZA VACCINE  09/24/2022   Medicare Annual Wellness (AWV)  08/04/2023   DTaP/Tdap/Td (3 - Td or Tdap) 04/05/2029   Pneumonia Vaccine 79+ Years old  Completed   Zoster Vaccines- Shingrix  Completed   HPV VACCINES  Aged Out    Health Maintenance  Health Maintenance Due  Topic Date Due   COVID-19 Vaccine (5 - 2023-24 season) 10/24/2021    Colorectal cancer screening: No longer required.   Lung Cancer Screening: (Low Dose CT Chest recommended if Age 70-80  years, 30 pack-year currently smoking OR have quit w/in 15years.) does not qualify.   Lung Cancer Screening Referral: no  Additional Screening:  Hepatitis C Screening: does not qualify; Completed: no  Vision Screening: Recommended annual ophthalmology exams for early detection of glaucoma and other disorders of the eye. Is the patient up to date with their annual eye exam?  Yes  Scheduled for September 2024 Who is the provider or what is the name of the office in which the patient attends annual eye exams? Ryan Snipes, OD. If pt is not established with a provider, would they like to be referred to a provider to establish care? No .   Dental Screening: Recommended annual dental exams for proper oral hygiene  Community Resource Referral / Chronic Care Management: CRR required this visit?  No   CCM required this visit?  No      Plan:     I have personally reviewed and noted the following in the patient's chart:   Medical and social history Use of alcohol, tobacco or illicit drugs  Current medications and supplements including opioid prescriptions. Patient is not  currently taking opioid prescriptions. Functional ability and status Nutritional status Physical activity Advanced directives List of other physicians Hospitalizations, surgeries, and ER visits in previous 12 months Vitals Screenings to include cognitive, depression, and falls Referrals and appointments  In addition, I have reviewed and discussed with patient certain preventive protocols, quality metrics, and best practice recommendations. A written personalized care plan for preventive services as well as general preventive health recommendations were provided to patient.     Mickeal Needy, LPN   1/61/0960   Nurse Notes: Normal cognitive status assessed by direct observation by this Nurse Health Advisor. No abnormalities found.    Medical screening examination/treatment/procedure(s) were performed by  non-physician practitioner and as supervising physician I was immediately available for consultation/collaboration.  I agree with above. Jacinta Shoe, MD

## 2022-09-16 DIAGNOSIS — C678 Malignant neoplasm of overlapping sites of bladder: Secondary | ICD-10-CM | POA: Diagnosis not present

## 2022-10-08 ENCOUNTER — Encounter (INDEPENDENT_AMBULATORY_CARE_PROVIDER_SITE_OTHER): Payer: Self-pay

## 2022-10-13 DIAGNOSIS — H26493 Other secondary cataract, bilateral: Secondary | ICD-10-CM | POA: Diagnosis not present

## 2022-10-13 DIAGNOSIS — H47291 Other optic atrophy, right eye: Secondary | ICD-10-CM | POA: Diagnosis not present

## 2022-10-13 DIAGNOSIS — Z961 Presence of intraocular lens: Secondary | ICD-10-CM | POA: Diagnosis not present

## 2022-10-13 DIAGNOSIS — H353131 Nonexudative age-related macular degeneration, bilateral, early dry stage: Secondary | ICD-10-CM | POA: Diagnosis not present

## 2022-10-13 DIAGNOSIS — H43392 Other vitreous opacities, left eye: Secondary | ICD-10-CM | POA: Diagnosis not present

## 2022-10-13 DIAGNOSIS — H338 Other retinal detachments: Secondary | ICD-10-CM | POA: Diagnosis not present

## 2022-10-14 DIAGNOSIS — N3289 Other specified disorders of bladder: Secondary | ICD-10-CM | POA: Diagnosis not present

## 2022-10-14 DIAGNOSIS — T83120D Displacement of urinary electronic stimulator device, subsequent encounter: Secondary | ICD-10-CM | POA: Diagnosis not present

## 2022-10-14 DIAGNOSIS — T83128A Displacement of other urinary devices and implants, initial encounter: Secondary | ICD-10-CM | POA: Diagnosis not present

## 2022-10-14 DIAGNOSIS — N138 Other obstructive and reflux uropathy: Secondary | ICD-10-CM | POA: Diagnosis not present

## 2022-10-27 ENCOUNTER — Ambulatory Visit: Payer: Medicare Other | Admitting: Orthopedic Surgery

## 2022-11-06 ENCOUNTER — Other Ambulatory Visit (INDEPENDENT_AMBULATORY_CARE_PROVIDER_SITE_OTHER): Payer: Medicare Other

## 2022-11-06 DIAGNOSIS — E538 Deficiency of other specified B group vitamins: Secondary | ICD-10-CM | POA: Diagnosis not present

## 2022-11-06 DIAGNOSIS — R739 Hyperglycemia, unspecified: Secondary | ICD-10-CM | POA: Diagnosis not present

## 2022-11-06 DIAGNOSIS — E785 Hyperlipidemia, unspecified: Secondary | ICD-10-CM | POA: Diagnosis not present

## 2022-11-06 DIAGNOSIS — E559 Vitamin D deficiency, unspecified: Secondary | ICD-10-CM | POA: Diagnosis not present

## 2022-11-06 DIAGNOSIS — D649 Anemia, unspecified: Secondary | ICD-10-CM | POA: Diagnosis not present

## 2022-11-06 LAB — HEPATIC FUNCTION PANEL
ALT: 9 U/L (ref 0–53)
AST: 15 U/L (ref 0–37)
Albumin: 4 g/dL (ref 3.5–5.2)
Alkaline Phosphatase: 80 U/L (ref 39–117)
Bilirubin, Direct: 0.1 mg/dL (ref 0.0–0.3)
Total Bilirubin: 0.4 mg/dL (ref 0.2–1.2)
Total Protein: 7.3 g/dL (ref 6.0–8.3)

## 2022-11-06 LAB — CBC WITH DIFFERENTIAL/PLATELET
Basophils Absolute: 0 10*3/uL (ref 0.0–0.1)
Basophils Relative: 0.6 % (ref 0.0–3.0)
Eosinophils Absolute: 0.1 10*3/uL (ref 0.0–0.7)
Eosinophils Relative: 1.3 % (ref 0.0–5.0)
HCT: 35.1 % — ABNORMAL LOW (ref 39.0–52.0)
Hemoglobin: 11.2 g/dL — ABNORMAL LOW (ref 13.0–17.0)
Lymphocytes Relative: 14.3 % (ref 12.0–46.0)
Lymphs Abs: 0.9 10*3/uL (ref 0.7–4.0)
MCHC: 32 g/dL (ref 30.0–36.0)
MCV: 89.5 fl (ref 78.0–100.0)
Monocytes Absolute: 0.5 10*3/uL (ref 0.1–1.0)
Monocytes Relative: 7.6 % (ref 3.0–12.0)
Neutro Abs: 4.7 10*3/uL (ref 1.4–7.7)
Neutrophils Relative %: 76.2 % (ref 43.0–77.0)
Platelets: 238 10*3/uL (ref 150.0–400.0)
RBC: 3.92 Mil/uL — ABNORMAL LOW (ref 4.22–5.81)
RDW: 15.6 % — ABNORMAL HIGH (ref 11.5–15.5)
WBC: 6.2 10*3/uL (ref 4.0–10.5)

## 2022-11-06 LAB — BASIC METABOLIC PANEL
BUN: 20 mg/dL (ref 6–23)
CO2: 29 meq/L (ref 19–32)
Calcium: 9.4 mg/dL (ref 8.4–10.5)
Chloride: 102 meq/L (ref 96–112)
Creatinine, Ser: 0.97 mg/dL (ref 0.40–1.50)
GFR: 70.26 mL/min (ref >60.00)
Glucose, Bld: 101 mg/dL — ABNORMAL HIGH (ref 70–99)
Potassium: 4.3 meq/L (ref 3.5–5.1)
Sodium: 138 meq/L (ref 135–145)

## 2022-11-06 LAB — VITAMIN D 25 HYDROXY (VIT D DEFICIENCY, FRACTURES): VITD: 61.85 ng/mL (ref 30.00–100.00)

## 2022-11-06 LAB — LIPID PANEL
Cholesterol: 157 mg/dL (ref 0–200)
HDL: 50.3 mg/dL (ref >39.00)
LDL Cholesterol: 86 mg/dL (ref 0–99)
NonHDL: 106.57
Total CHOL/HDL Ratio: 3
Triglycerides: 101 mg/dL (ref 0.0–149.0)
VLDL: 20.2 mg/dL (ref 0.0–40.0)

## 2022-11-06 LAB — IBC PANEL
Iron: 51 ug/dL (ref 42–165)
Saturation Ratios: 18.2 % — ABNORMAL LOW (ref 20.0–50.0)
TIBC: 280 ug/dL (ref 250.0–450.0)
Transferrin: 200 mg/dL — ABNORMAL LOW (ref 212.0–360.0)

## 2022-11-06 LAB — HEMOGLOBIN A1C: Hgb A1c MFr Bld: 6 % (ref 4.6–6.5)

## 2022-11-06 LAB — FERRITIN: Ferritin: 52.9 ng/mL (ref 22.0–322.0)

## 2022-11-06 LAB — VITAMIN B12: Vitamin B-12: 657 pg/mL (ref 211–911)

## 2022-11-06 NOTE — Progress Notes (Signed)
The test results show that your current treatment is OK, as the tests are stable.  Please continue the same plan.  There is no other need for change of treatment or further evaluation based on these results, at this time.  thanks

## 2022-11-10 ENCOUNTER — Ambulatory Visit (INDEPENDENT_AMBULATORY_CARE_PROVIDER_SITE_OTHER): Payer: Medicare Other | Admitting: Internal Medicine

## 2022-11-10 ENCOUNTER — Encounter: Payer: Self-pay | Admitting: Internal Medicine

## 2022-11-10 ENCOUNTER — Ambulatory Visit: Payer: Medicare Other | Admitting: Orthopedic Surgery

## 2022-11-10 VITALS — BP 120/78 | HR 62 | Temp 98.3°F | Ht 72.0 in | Wt 174.0 lb

## 2022-11-10 DIAGNOSIS — E785 Hyperlipidemia, unspecified: Secondary | ICD-10-CM

## 2022-11-10 DIAGNOSIS — L97511 Non-pressure chronic ulcer of other part of right foot limited to breakdown of skin: Secondary | ICD-10-CM | POA: Diagnosis not present

## 2022-11-10 DIAGNOSIS — R739 Hyperglycemia, unspecified: Secondary | ICD-10-CM

## 2022-11-10 DIAGNOSIS — I499 Cardiac arrhythmia, unspecified: Secondary | ICD-10-CM | POA: Diagnosis not present

## 2022-11-10 DIAGNOSIS — B351 Tinea unguium: Secondary | ICD-10-CM

## 2022-11-10 DIAGNOSIS — E559 Vitamin D deficiency, unspecified: Secondary | ICD-10-CM

## 2022-11-10 DIAGNOSIS — E538 Deficiency of other specified B group vitamins: Secondary | ICD-10-CM

## 2022-11-10 DIAGNOSIS — Z23 Encounter for immunization: Secondary | ICD-10-CM | POA: Diagnosis not present

## 2022-11-10 DIAGNOSIS — I10 Essential (primary) hypertension: Secondary | ICD-10-CM

## 2022-11-10 NOTE — Addendum Note (Signed)
Addended by: Corwin Levins on: 11/10/2022 08:55 PM   Modules accepted: Orders

## 2022-11-10 NOTE — Patient Instructions (Signed)
Your EKG did not show the Atrial Fib today - only a few extra beats  Please call if you change your mind about the medication for cholesterol, or Physical Therapy  Please continue all other medications as before, and refills have been done if requested.  Please have the pharmacy call with any other refills you may need.  Please continue your efforts at being more active, low cholesterol diet, and weight control.  Please keep your appointments with your specialists as you may have planned  Please make an Appointment to return in 6 months, or sooner if needed, also with Lab Appointment for testing done 3-5 days before at the FIRST FLOOR Lab (so this is for TWO appointments - please see the scheduling desk as you leave)

## 2022-11-10 NOTE — Assessment & Plan Note (Signed)
Lab Results  Component Value Date   HGBA1C 6.0 11/06/2022   Stable, pt to continue current medical treatment  - diet, wt control

## 2022-11-10 NOTE — Progress Notes (Addendum)
Patient ID: Stephen Meza, male   DOB: [birth year], 89 y.o.   MRN: [id number]        Chief Complaint: follow up HTN, HLD and hyperglycemia , low b12, irregular heart beat       HPI:  Stephen Meza is a 89 y.o. male here overall doing ok,  Pt denies chest pain, increased sob or doe, wheezing, orthopnea, PND, increased LE swelling, palpitations, dizziness or syncope.   Pt denies polydipsia, polyuria, or new focal neuro s/s.    Pt denies fever, wt loss, night sweats, loss of appetite, or other constitutional symptoms         Wt Readings from Last 3 Encounters:  11/10/22 174 lb (78.9 kg)  08/04/22 175 lb 6.4 oz (79.6 kg)  05/12/22 179 lb (81.2 kg)   BP Readings from Last 3 Encounters:  11/10/22 120/78  08/04/22 118/78  05/12/22 136/82         Past Medical History:  Diagnosis Date   DUODENITIS, WITHOUT HEMORRHAGE 07/14/2007   ERECTILE DYSFUNCTION 07/14/2007   Esophageal stricture 03/19/2016   ESOPHAGITIS 07/14/2007   EXTERNAL HEMORRHOIDS 07/14/2007   GERD 07/14/2007   Gout    HYPERTENSION 07/14/2007   PERIPHERAL NEUROPATHY 07/14/2007   Peripheral neuropathy 04/06/2019   PERIPHERAL VASCULAR DISEASE 07/14/2007   PODAGRA 07/14/2007   Prostate cancer (HCC) 02/2012   pt had screening and then biopsy   Retinal tear of right eye 03/19/2016   TOBACCO ABUSE, HX OF 02/12/2010   TONSILLECTOMY, HX OF 07/14/2007   TREMOR, ESSENTIAL 07/14/2007   VITAMIN B12 DEFICIENCY 07/14/2007   Past Surgical History:  Procedure Laterality Date   Excision of lipoma     PROSTATE BIOPSY Bilateral 03/10/2012   surgery to reduce turbinate and straighten deviated septum  2016   TONSILLECTOMY      reports that he quit smoking about 28 years ago. His smoking use included cigarettes. He has quit using smokeless tobacco. He reports that he does not currently use alcohol. He reports that he does not use drugs. family history includes Breast cancer in his sister and another family member; Cancer in his mother;  Hypothyroidism in his daughter; Other in his father. Allergies  Allergen Reactions   Nitrofuran Derivatives Rash   Ciprofloxacin Hcl Other (See Comments)    numbness   Current Outpatient Medications on File Prior to Visit  Medication Sig Dispense Refill   allopurinol (ZYLOPRIM) 100 MG tablet TAKE 1 TABLET BY MOUTH EVERY DAY 90 tablet 4   amLODipine (NORVASC) 2.5 MG tablet Take 1 tablet (2.5 mg total) by mouth daily. 90 tablet 3   Cyanocobalamin (B-12) 3000 MCG CAPS Take by mouth daily.     docusate sodium (COLACE) 100 MG capsule Take 100 mg by mouth once.     gabapentin (NEURONTIN) 100 MG capsule Take 100 mg by mouth 3 (three) times daily.     omeprazole (PRILOSEC) 40 MG capsule TAKE 1 CAPSULE BY MOUTH EVERY DAY 90 capsule 2   No current facility-administered medications on file prior to visit.        ROS:  All others reviewed and negative.  Objective        PE:  BP 120/78 (BP Location: Right Arm, Patient Position: Sitting, Cuff Size: Normal)   Pulse 62   Temp 98.3 F (36.8 C) (Oral)   Ht 6' (1.829 m)   Wt 174 lb (78.9 kg)   SpO2 96%   BMI 23.60 kg/m  Constitutional: Pt appears in NAD               HENT: Head: NCAT.                Right Ear: External ear normal.                 Left Ear: External ear normal.                Eyes: . Pupils are equal, round, and reactive to light. Conjunctivae and EOM are normal               Nose: without d/c or deformity               Neck: Neck supple. Gross normal ROM               Cardiovascular: Normal rate and regular rhythm with occasional ectopy               Pulmonary/Chest: Effort normal and breath sounds without rales or wheezing.                Abd:  Soft, NT, ND, + BS, no organomegaly               Neurological: Pt is alert. At baseline orientation, motor grossly intact               Skin: Skin is warm. No rashes, no other new lesions, LE edema - none               Psychiatric: Pt behavior is normal without  agitation   Micro: none  Cardiac tracings I have personally interpreted today:  ECG - SR with PACs  Pertinent Radiological findings (summarize): none   Lab Results  Component Value Date   WBC 6.2 11/06/2022   HGB 11.2 (L) 11/06/2022   HCT 35.1 (L) 11/06/2022   PLT 238.0 11/06/2022   GLUCOSE 101 (H) 11/06/2022   CHOL 157 11/06/2022   TRIG 101.0 11/06/2022   HDL 50.30 11/06/2022   LDLDIRECT 93.0 03/15/2014   LDLCALC 86 11/06/2022   ALT 9 11/06/2022   AST 15 11/06/2022   NA 138 11/06/2022   K 4.3 11/06/2022   CL 102 11/06/2022   CREATININE 0.97 11/06/2022   BUN 20 11/06/2022   CO2 29 11/06/2022   TSH 3.09 05/08/2022   PSA 3.54 02/12/2010   HGBA1C 6.0 11/06/2022   MICROALBUR 0.9 05/08/2022   Assessment/Plan:  Stephen Meza is a 20 y.o. White or Caucasian [1] male with  has a past medical history of DUODENITIS, WITHOUT HEMORRHAGE (07/14/2007), ERECTILE DYSFUNCTION (07/14/2007), Esophageal stricture (03/19/2016), ESOPHAGITIS (07/14/2007), EXTERNAL HEMORRHOIDS (07/14/2007), GERD (07/14/2007), Gout, HYPERTENSION (07/14/2007), PERIPHERAL NEUROPATHY (07/14/2007), Peripheral neuropathy (04/06/2019), PERIPHERAL VASCULAR DISEASE (07/14/2007), PODAGRA (07/14/2007), Prostate cancer (HCC) (02/2012), Retinal tear of right eye (03/19/2016), TOBACCO ABUSE, HX OF (02/12/2010), TONSILLECTOMY, HX OF (07/14/2007), TREMOR, ESSENTIAL (07/14/2007), and VITAMIN B12 DEFICIENCY (07/14/2007).  Essential hypertension BP Readings from Last 3 Encounters:  11/10/22 120/78  08/04/22 118/78  05/12/22 136/82   Stable, pt to continue medical treatment norvasc 2.5 every day,   Hyperglycemia Lab Results  Component Value Date   HGBA1C 6.0 11/06/2022   Stable, pt to continue current medical treatment  - diet, wt control   Hyperlipidemia with target LDL less than 130 Lab Results  Component Value Date   LDLCALC 86 11/06/2022   Uncontrolled, goal ldl < 70, pt for lower chol diet, declines any antilipid tx at  wifes insistence  Vitamin B12 deficiency Lab Results  Component Value Date   VITAMINB12 657 11/06/2022   Stable, cont oral replacement - b12 1000 mcg qd   Irregular heartbeat Ecg - SR with PACs reviewed today,  to f/u any worsening symptoms or concerns, no Afib noted  Followup: Return in about 6 months (around 05/10/2023).  Oliver Barre, MD 11/10/2022 8:54 PM [facility]

## 2022-11-10 NOTE — Assessment & Plan Note (Signed)
Lab Results  Component Value Date   VITAMINB12 657 11/06/2022   Stable, cont oral replacement - b12 1000 mcg qd

## 2022-11-10 NOTE — Assessment & Plan Note (Signed)
Ecg - SR with PACs reviewed today,  to f/u any worsening symptoms or concerns, no Afib noted

## 2022-11-10 NOTE — Assessment & Plan Note (Signed)
BP Readings from Last 3 Encounters:  11/10/22 120/78  08/04/22 118/78  05/12/22 136/82   Stable, pt to continue medical treatment norvasc 2.5 every day,

## 2022-11-10 NOTE — Assessment & Plan Note (Addendum)
Lab Results  Component Value Date   LDLCALC 86 11/06/2022   Uncontrolled, goal ldl < 70, pt for lower chol diet, declines any antilipid tx at wifes insistence

## 2022-11-11 ENCOUNTER — Encounter: Payer: Self-pay | Admitting: Orthopedic Surgery

## 2022-11-11 NOTE — Progress Notes (Signed)
Office Visit Note   Patient: Stephen Meza           Date of Birth: Feb 04, 1934           MRN: [id number] Visit Date: 11/10/2022              Requested by: Corwin Levins, MD [address] [place],  [place] [address] PCP: Corwin Levins, MD  Chief Complaint  Patient presents with   Right Foot - Follow-up    Bilateral toenail trimming   Left Foot - Follow-up      HPI: Patient is an 89 year old gentleman presents in follow-up for both lower extremities.  Patient complains of a painful callus beneath the fifth metatarsal head and painful onychomycotic nails x 10.  Assessment & Plan: Visit Diagnoses:  1. Onychomycosis   2. Non-pressure chronic ulcer of other part of right foot limited to breakdown of skin (HCC)     Plan: Nails were trimmed x 10 without complication.  Callus pared x 1.  Follow-Up Instructions: Return in about 3 months (around 02/09/2023).   Ortho Exam  Patient is alert, oriented, no adenopathy, well-dressed, normal affect, normal respiratory effort. Examination patient has a hypertrophic callus beneath the fifth metatarsal head right foot.  After informed consent a 10 blade knife was used to pare the callus there is no open wound no signs of infection.  Patient has no other ulcers on his feet.  Patient does have thickened discolored onychomycotic nails x 10 he is unable to safely trim the nails on his own and the nails were trimmed x 10 without complication.  Imaging: No results found. No images are attached to the encounter.  Labs: Lab Results  Component Value Date   HGBA1C 6.0 11/06/2022   HGBA1C 5.8 05/08/2022   HGBA1C 6.0 11/07/2021   ESRSEDRATE 19 08/01/2018   LABURIC 5.0 11/28/2012   LABURIC 6.4 03/10/2011   LABURIC 6.8 02/12/2010     Lab Results  Component Value Date   ALBUMIN 4.0 11/06/2022   ALBUMIN 4.1 05/08/2022   ALBUMIN 4.2 11/07/2021    No results found for: "MG" Lab Results  Component Value Date   VD25OH 61.85  11/06/2022   VD25OH 46.83 05/08/2022   VD25OH 47.18 04/22/2021    No results found for: "PREALBUMIN"    Latest Ref Rng & Units 11/06/2022   12:32 PM 05/08/2022    1:08 PM 11/07/2021    4:26 PM  CBC EXTENDED  WBC 4.0 - 10.5 K/uL 6.2  5.8  5.9   RBC 4.22 - 5.81 Mil/uL 3.92  4.10  4.27   Hemoglobin 13.0 - 17.0 g/dL 44.0  34.7  42.5   HCT 39.0 - 52.0 % 35.1  36.4  38.2   Platelets 150.0 - 400.0 K/uL 238.0  214.0  205.0   NEUT# 1.4 - 7.7 K/uL 4.7  4.1  4.0   Lymph# 0.7 - 4.0 K/uL 0.9  1.2  1.2      There is no height or weight on file to calculate BMI.  Orders:  No orders of the defined types were placed in this encounter.  No orders of the defined types were placed in this encounter.    Procedures: No procedures performed  Clinical Data: No additional findings.  ROS:  All other systems negative, except as noted in the HPI. Review of Systems  Objective: Vital Signs: There were no vitals taken for this visit.  Specialty Comments:  No specialty comments available.  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   Low grade fever 04/06/2019   Bilateral leg pain 04/04/2018   Bilateral bunions 04/04/2018   Ganglion cyst of dorsum of right wrist 04/04/2018   Chronic low back pain 04/04/2018   Gait disorder 04/04/2018   Recurrent falls while walking 04/04/2018   Abrasion of knee, bilateral 04/04/2018   Bladder cancer (HCC) 03/19/2016   Esophageal stricture 03/19/2016   Retinal tear of right eye 03/19/2016   Dizziness 03/19/2016   Hyperglycemia 03/16/2015   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   Prostate cancer (HCC) 04/13/2012   Hyperlipidemia with target LDL less than 130 04/12/2012   Encounter for well adult exam with abnormal findings  03/11/2011   TOBACCO ABUSE, HX OF 02/12/2010   SHOULDER PAIN 10/10/2007   Vitamin B12 deficiency 07/14/2007   PODAGRA 07/14/2007   ERECTILE DYSFUNCTION 07/14/2007   TREMOR, ESSENTIAL 07/14/2007   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   Essential hypertension 07/14/2007   PERIPHERAL VASCULAR DISEASE 07/14/2007   EXTERNAL HEMORRHOIDS 07/14/2007   ESOPHAGITIS 07/14/2007   GERD 07/14/2007   TONSILLECTOMY, HX OF 07/14/2007   Past Medical History:  Diagnosis Date   DUODENITIS, WITHOUT HEMORRHAGE 07/14/2007   ERECTILE DYSFUNCTION 07/14/2007   Esophageal stricture 03/19/2016   ESOPHAGITIS 07/14/2007   EXTERNAL HEMORRHOIDS 07/14/2007   GERD 07/14/2007   Gout    HYPERTENSION 07/14/2007   PERIPHERAL NEUROPATHY 07/14/2007   Peripheral neuropathy 04/06/2019   PERIPHERAL VASCULAR DISEASE 07/14/2007   PODAGRA 07/14/2007   Prostate cancer (HCC) 02/2012   pt had screening and then biopsy   Retinal tear of right eye 03/19/2016   TOBACCO ABUSE, HX OF 02/12/2010   TONSILLECTOMY, HX OF 07/14/2007   TREMOR, ESSENTIAL 07/14/2007   VITAMIN B12 DEFICIENCY 07/14/2007    Family History  Problem Relation Age of Onset   Hypothyroidism Daughter    Breast cancer Other    Cancer Mother    Other Father    Breast cancer Sister     Past Surgical History:  Procedure Laterality Date   Excision of lipoma     PROSTATE BIOPSY Bilateral 03/10/2012   surgery to reduce turbinate and straighten deviated septum  2016   TONSILLECTOMY     Social History   Occupational History   Occupation: retired    Associate Professor: RETIRED  Tobacco Use   Smoking status: Former    Current packs/day: 0.00    Types: Cigarettes    Quit date: 02/23/1994    Years since quitting: 28.7   Smokeless tobacco: Former  Building services engineer status: Never Used  Substance and Sexual Activity   Alcohol use: Not Currently    Comment: d/c beer about 5 months ago   Drug use: No   Sexual activity: Yes    Partners: Female

## 2022-11-19 DIAGNOSIS — Z961 Presence of intraocular lens: Secondary | ICD-10-CM | POA: Diagnosis not present

## 2022-11-19 DIAGNOSIS — H338 Other retinal detachments: Secondary | ICD-10-CM | POA: Diagnosis not present

## 2022-11-19 DIAGNOSIS — H43392 Other vitreous opacities, left eye: Secondary | ICD-10-CM | POA: Diagnosis not present

## 2022-11-19 DIAGNOSIS — H26493 Other secondary cataract, bilateral: Secondary | ICD-10-CM | POA: Diagnosis not present

## 2022-11-19 DIAGNOSIS — H524 Presbyopia: Secondary | ICD-10-CM | POA: Diagnosis not present

## 2022-11-19 DIAGNOSIS — H35373 Puckering of macula, bilateral: Secondary | ICD-10-CM | POA: Diagnosis not present

## 2022-11-19 DIAGNOSIS — H47291 Other optic atrophy, right eye: Secondary | ICD-10-CM | POA: Diagnosis not present

## 2022-11-19 DIAGNOSIS — H18413 Arcus senilis, bilateral: Secondary | ICD-10-CM | POA: Diagnosis not present

## 2022-11-19 DIAGNOSIS — H353131 Nonexudative age-related macular degeneration, bilateral, early dry stage: Secondary | ICD-10-CM | POA: Diagnosis not present

## 2022-11-19 DIAGNOSIS — H26491 Other secondary cataract, right eye: Secondary | ICD-10-CM | POA: Diagnosis not present

## 2022-12-08 DIAGNOSIS — H26492 Other secondary cataract, left eye: Secondary | ICD-10-CM | POA: Diagnosis not present

## 2023-01-02 ENCOUNTER — Other Ambulatory Visit: Payer: Self-pay | Admitting: Internal Medicine

## 2023-01-26 ENCOUNTER — Ambulatory Visit: Payer: Medicare Other | Admitting: Orthopedic Surgery

## 2023-01-26 ENCOUNTER — Encounter: Payer: Self-pay | Admitting: Orthopedic Surgery

## 2023-01-26 DIAGNOSIS — B351 Tinea unguium: Secondary | ICD-10-CM | POA: Diagnosis not present

## 2023-01-26 DIAGNOSIS — L97511 Non-pressure chronic ulcer of other part of right foot limited to breakdown of skin: Secondary | ICD-10-CM

## 2023-01-26 DIAGNOSIS — I872 Venous insufficiency (chronic) (peripheral): Secondary | ICD-10-CM | POA: Diagnosis not present

## 2023-01-26 NOTE — Progress Notes (Signed)
Office Visit Note   Patient: Stephen Meza           Date of Birth: 1933-12-31           MRN: [id number] Visit Date: 01/26/2023              Requested by: Corwin Levins, MD [address] [place],  [place] [address] PCP: Corwin Levins, MD  Chief Complaint  Patient presents with   Left Foot - Follow-up   Right Foot - Follow-up      HPI: Patient is an 89 year old gentleman who presents with 3 separate issues.  Patient states he has been having increasing venous insufficiency of both lower extremities.  A painful ulcer beneath the fifth metatarsal head of the right foot.  Onychomycotic nails x 10 both lower extremities.  Assessment & Plan: Visit Diagnoses:  1. Onychomycosis   2. Non-pressure chronic ulcer of other part of right foot limited to breakdown of skin (HCC)   3. Venous stasis dermatitis of both lower extremities     Plan: Recommended compression stockings at [facility] [employer] to be worn daily.  The callus was pared fifth metatarsal head right foot.  Nails trimmed x 10.  Follow-Up Instructions: Return in about 3 months (around 04/26/2023).   Ortho Exam  Patient is alert, oriented, no adenopathy, well-dressed, normal affect, normal respiratory effort. Examination patient has venous insufficiency without open ulcers.  He does have a hypertrophic callus beneath the fifth metatarsal head right foot.  After informed consent a 10 blade knife was used to pare the callus without complication.  No sign of deep infection.  Patient has thickened discolored onychomycotic nails x 10 and the nails were trimmed x 10 without complication.  Patient is unable to safely trim the nails on his own.  Imaging: No results found. No images are attached to the encounter.  Labs: Lab Results  Component Value Date   HGBA1C 6.0 11/06/2022   HGBA1C 5.8 05/08/2022   HGBA1C 6.0 11/07/2021   ESRSEDRATE 19 08/01/2018   LABURIC 5.0 11/28/2012   LABURIC 6.4 03/10/2011   LABURIC  6.8 02/12/2010     Lab Results  Component Value Date   ALBUMIN 4.0 11/06/2022   ALBUMIN 4.1 05/08/2022   ALBUMIN 4.2 11/07/2021    No results found for: "MG" Lab Results  Component Value Date   VD25OH 61.85 11/06/2022   VD25OH 46.83 05/08/2022   VD25OH 47.18 04/22/2021    No results found for: "PREALBUMIN"    Latest Ref Rng & Units 11/06/2022   12:32 PM 05/08/2022    1:08 PM 11/07/2021    4:26 PM  CBC EXTENDED  WBC 4.0 - 10.5 K/uL 6.2  5.8  5.9   RBC 4.22 - 5.81 Mil/uL 3.92  4.10  4.27   Hemoglobin 13.0 - 17.0 g/dL 52.8  41.3  24.4   HCT 39.0 - 52.0 % 35.1  36.4  38.2   Platelets 150.0 - 400.0 K/uL 238.0  214.0  205.0   NEUT# 1.4 - 7.7 K/uL 4.7  4.1  4.0   Lymph# 0.7 - 4.0 K/uL 0.9  1.2  1.2      There is no height or weight on file to calculate BMI.  Orders:  No orders of the defined types were placed in this encounter.  No orders of the defined types were placed in this encounter.    Procedures: No procedures performed  Clinical Data: No additional findings.  ROS:  All other  systems negative, except as noted in the HPI. Review of Systems  Objective: Vital Signs: There were no vitals taken for this visit.  Specialty Comments:  No specialty comments available.  PMFS History: Patient Active Problem List   Diagnosis Date Noted   Irregular heartbeat 11/10/2022   Anemia 05/03/2021   Tick bite, infected 08/07/2020   Peripheral neuropathy 04/06/2019   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   Low grade fever 04/06/2019   Bilateral leg pain 04/04/2018   Bilateral bunions 04/04/2018   Ganglion cyst of dorsum of right wrist 04/04/2018   Chronic low back pain 04/04/2018   Gait disorder 04/04/2018   Recurrent falls while walking 04/04/2018   Abrasion of knee, bilateral 04/04/2018   Bladder cancer (HCC) 03/19/2016   Esophageal stricture 03/19/2016   Retinal tear of right eye 03/19/2016   Dizziness 03/19/2016   Hyperglycemia  03/16/2015   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   Prostate cancer (HCC) 04/13/2012   Hyperlipidemia with target LDL less than 130 04/12/2012   Encounter for well adult exam with abnormal findings 03/11/2011   TOBACCO ABUSE, HX OF 02/12/2010   SHOULDER PAIN 10/10/2007   Vitamin B12 deficiency 07/14/2007   PODAGRA 07/14/2007   ERECTILE DYSFUNCTION 07/14/2007   TREMOR, ESSENTIAL 07/14/2007   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   Essential hypertension 07/14/2007   PERIPHERAL VASCULAR DISEASE 07/14/2007   External hemorrhoids 07/14/2007   Esophagitis 07/14/2007   GERD 07/14/2007   TONSILLECTOMY, HX OF 07/14/2007   Past Medical History:  Diagnosis Date   DUODENITIS, WITHOUT HEMORRHAGE 07/14/2007   ERECTILE DYSFUNCTION 07/14/2007   Esophageal stricture 03/19/2016   ESOPHAGITIS 07/14/2007   EXTERNAL HEMORRHOIDS 07/14/2007   GERD 07/14/2007   Gout    HYPERTENSION 07/14/2007   PERIPHERAL NEUROPATHY 07/14/2007   Peripheral neuropathy 04/06/2019   PERIPHERAL VASCULAR DISEASE 07/14/2007   PODAGRA 07/14/2007   Prostate cancer (HCC) 02/2012   pt had screening and then biopsy   Retinal tear of right eye 03/19/2016   TOBACCO ABUSE, HX OF 02/12/2010   TONSILLECTOMY, HX OF 07/14/2007   TREMOR, ESSENTIAL 07/14/2007   VITAMIN B12 DEFICIENCY 07/14/2007    Family History  Problem Relation Age of Onset   Hypothyroidism Daughter    Breast cancer Other    Cancer Mother    Other Father    Breast cancer Sister     Past Surgical History:  Procedure Laterality Date   Excision of lipoma     PROSTATE BIOPSY Bilateral 03/10/2012   surgery to reduce turbinate and straighten deviated septum  2016   TONSILLECTOMY     Social History   Occupational History   Occupation: retired    Associate Professor: RETIRED  Tobacco Use   Smoking status: Former    Current packs/day: 0.00    Types: Cigarettes    Quit date: 02/23/1994    Years since quitting: 28.9   Smokeless tobacco: Former   Building services engineer status: Never Used  Substance and Sexual Activity   Alcohol use: Not Currently    Comment: d/c beer about 5 months ago   Drug use: No   Sexual activity: Yes    Partners: Female

## 2023-03-04 DIAGNOSIS — R051 Acute cough: Secondary | ICD-10-CM | POA: Diagnosis not present

## 2023-03-04 DIAGNOSIS — R509 Fever, unspecified: Secondary | ICD-10-CM | POA: Diagnosis not present

## 2023-03-04 DIAGNOSIS — R0981 Nasal congestion: Secondary | ICD-10-CM | POA: Diagnosis not present

## 2023-03-10 ENCOUNTER — Ambulatory Visit: Payer: Medicare Other | Admitting: Family Medicine

## 2023-03-10 ENCOUNTER — Encounter: Payer: Self-pay | Admitting: Family Medicine

## 2023-03-10 ENCOUNTER — Ambulatory Visit (INDEPENDENT_AMBULATORY_CARE_PROVIDER_SITE_OTHER): Payer: Medicare Other | Admitting: Family Medicine

## 2023-03-10 ENCOUNTER — Ambulatory Visit (INDEPENDENT_AMBULATORY_CARE_PROVIDER_SITE_OTHER): Payer: Medicare Other

## 2023-03-10 VITALS — BP 130/86 | HR 63 | Ht 72.0 in | Wt 171.0 lb

## 2023-03-10 DIAGNOSIS — R200 Anesthesia of skin: Secondary | ICD-10-CM

## 2023-03-10 DIAGNOSIS — G621 Alcoholic polyneuropathy: Secondary | ICD-10-CM

## 2023-03-10 DIAGNOSIS — I700 Atherosclerosis of aorta: Secondary | ICD-10-CM | POA: Diagnosis not present

## 2023-03-10 DIAGNOSIS — R0789 Other chest pain: Secondary | ICD-10-CM | POA: Diagnosis not present

## 2023-03-10 DIAGNOSIS — R918 Other nonspecific abnormal finding of lung field: Secondary | ICD-10-CM | POA: Diagnosis not present

## 2023-03-10 DIAGNOSIS — J189 Pneumonia, unspecified organism: Secondary | ICD-10-CM

## 2023-03-10 DIAGNOSIS — I499 Cardiac arrhythmia, unspecified: Secondary | ICD-10-CM | POA: Diagnosis not present

## 2023-03-10 DIAGNOSIS — R202 Paresthesia of skin: Secondary | ICD-10-CM

## 2023-03-10 LAB — CBC WITH DIFFERENTIAL/PLATELET
Basophils Absolute: 0.1 10*3/uL (ref 0.0–0.1)
Basophils Relative: 0.8 % (ref 0.0–3.0)
Eosinophils Absolute: 0.1 10*3/uL (ref 0.0–0.7)
Eosinophils Relative: 1.2 % (ref 0.0–5.0)
HCT: 35.6 % — ABNORMAL LOW (ref 39.0–52.0)
Hemoglobin: 11.3 g/dL — ABNORMAL LOW (ref 13.0–17.0)
Lymphocytes Relative: 12.8 % (ref 12.0–46.0)
Lymphs Abs: 1 10*3/uL (ref 0.7–4.0)
MCHC: 31.9 g/dL (ref 30.0–36.0)
MCV: 88.8 fL (ref 78.0–100.0)
Monocytes Absolute: 0.6 10*3/uL (ref 0.1–1.0)
Monocytes Relative: 7.3 % (ref 3.0–12.0)
Neutro Abs: 6.3 10*3/uL (ref 1.4–7.7)
Neutrophils Relative %: 77.9 % — ABNORMAL HIGH (ref 43.0–77.0)
Platelets: 311 10*3/uL (ref 150.0–400.0)
RBC: 4.01 Mil/uL — ABNORMAL LOW (ref 4.22–5.81)
RDW: 15 % (ref 11.5–15.5)
WBC: 8.1 10*3/uL (ref 4.0–10.5)

## 2023-03-10 LAB — COMPREHENSIVE METABOLIC PANEL
ALT: 8 U/L (ref 0–53)
AST: 15 U/L (ref 0–37)
Albumin: 4.2 g/dL (ref 3.5–5.2)
Alkaline Phosphatase: 82 U/L (ref 39–117)
BUN: 14 mg/dL (ref 6–23)
CO2: 30 meq/L (ref 19–32)
Calcium: 9.7 mg/dL (ref 8.4–10.5)
Chloride: 100 meq/L (ref 96–112)
Creatinine, Ser: 0.91 mg/dL (ref 0.40–1.50)
GFR: 75.67 mL/min (ref >60.00)
Glucose, Bld: 103 mg/dL — ABNORMAL HIGH (ref 70–99)
Potassium: 4.4 meq/L (ref 3.5–5.1)
Sodium: 138 meq/L (ref 135–145)
Total Bilirubin: 0.4 mg/dL (ref 0.2–1.2)
Total Protein: 7.8 g/dL (ref 6.0–8.3)

## 2023-03-10 NOTE — Patient Instructions (Addendum)
 We are getting an xray today. We will be in contact with any abnormal results that require further attention.  EKG today is the same as the last one in September, nothing concerning today  Continue doxycycline  and benzonatate  as prescribed.  I have sent in a prescription for the incentive spirometer to help keep your lungs open.  Follow-up with me for new or worsening symptoms.

## 2023-03-10 NOTE — Progress Notes (Signed)
 Acute Office Visit  Subjective:     Patient ID: Stephen Meza, male    DOB: 1934-02-04, 89 y.o.   MRN: [id number]  Chief Complaint  Patient presents with   Pneumonia    [facility] Pneumonia noted when receiving patient's xray results. Prescribed benzonatate  100 mg and doxycycline  hyclate 100 mg.   Numbness    Numb/cold in their left arm as well as some shortness of breath. History of slight arthritis and no history of stroke. Does sometimes note of note of numbness in legs and does have history of bilateral neuropathy in feet.    HPI Patient is in today for follow-up of pneumonia. Was seen at urgent care and [place] diagnosed with pneumonia into the right lower lobe.  Was treated with doxycycline  as well as benzonatate .  Reports symptoms have greatly improved. He is still having some shortness of breath. Does report some left-sided chest tightness as well as numbness and tingling down the left arm. Has not attempted to treat this part at home.  Reports that sometimes the numbness and tingling will cause his hands and fingers to tighten. Denies known sick contacts Denies abdominal pain, nausea, vomiting, diarrhea, rash, fever, chills, other symptoms.  Medical hx as outlined below.  ROS Per HPI      Objective:    BP 130/86   Pulse 63   Ht 6' (1.829 m)   Wt 171 lb (77.6 kg)   SpO2 99%   BMI 23.19 kg/m    Physical Exam Vitals and nursing note reviewed.  Constitutional:      General: He is not in acute distress.    Comments: Elderly   HENT:     Head: Normocephalic and atraumatic.     Mouth/Throat:     Mouth: Mucous membranes are moist.     Pharynx: Oropharynx is clear. No oropharyngeal exudate or posterior oropharyngeal erythema.  Eyes:     Extraocular Movements: Extraocular movements intact.  Cardiovascular:     Rate and Rhythm: Normal rate. Rhythm irregular.     Pulses: Normal pulses.  Pulmonary:      Effort: Pulmonary effort is normal. No respiratory distress.     Breath sounds: No wheezing, rhonchi or rales.  Musculoskeletal:     Cervical back: Normal range of motion.     Comments: Stiff gait, using single point cane today.  LROM to left shoulder, no swelling, no bruising, nontender  Lymphadenopathy:     Cervical: Cervical adenopathy present.  Skin:    General: Skin is warm and dry.     Capillary Refill: Capillary refill takes less than 2 seconds.  Neurological:     General: No focal deficit present.     Mental Status: He is alert. Mental status is at baseline.    No results found for any visits on 03/10/23.      Assessment & Plan:  1. Community acquired pneumonia of right lower lobe of lung (Primary)  - For home use only DME Other see comment - DG Chest 2 View; Future - CBC with Differential/Platelet  2. Irregular heartbeat  - EKG 12-Lead - CBC with Differential/Platelet - Comprehensive metabolic panel  3. Numbness and tingling in left arm  - EKG 12-Lead - CBC with Differential/Platelet - Comprehensive metabolic panel - D-dimer, quantitative  4. Other chest pain  - EKG 12-Lead - Comprehensive metabolic panel - D-dimer, quantitative  5. Alcoholic peripheral neuropathy (HCC)  -Likely the cause of  the numbness and tingling left arm, discussed this could be exacerbated by inflammation of recent illness    Return if symptoms worsen or fail to improve.  Casimer Clear, FNP

## 2023-03-11 ENCOUNTER — Telehealth: Payer: Self-pay

## 2023-03-11 DIAGNOSIS — R0789 Other chest pain: Secondary | ICD-10-CM

## 2023-03-11 DIAGNOSIS — R7989 Other specified abnormal findings of blood chemistry: Secondary | ICD-10-CM

## 2023-03-11 DIAGNOSIS — R9389 Abnormal findings on diagnostic imaging of other specified body structures: Secondary | ICD-10-CM

## 2023-03-11 LAB — D-DIMER, QUANTITATIVE: D-Dimer, Quant: 1.74 ug{FEU}/mL — ABNORMAL HIGH (ref <0.50)

## 2023-03-11 NOTE — Addendum Note (Signed)
Addended by: Theressa Stamps on: 03/11/2023 01:21 PM   Modules accepted: Orders

## 2023-03-11 NOTE — Telephone Encounter (Signed)
Copied from CRM [id number]. Topic: Clinical - Lab/Test Results >> Mar 11, 2023 10:39 AM Florestine Avers wrote: Reason for CRM: Patients wife called in so she can have someone call her back to go over the patients xray and his lab results. She has questions and can be reached at [phone].

## 2023-03-11 NOTE — Telephone Encounter (Signed)
Spoke with wife, Johnny Bridge, okay by [facility]  Made aware of D.Dimer results and discussed xRay results as well.  Patient wife stated daughter can bring him as wife is out of town but if scheduled Monday, afternoon then wife can come with him.  Orders placed for Chest CT STAT and sent to Prior Authorization STAT.

## 2023-03-11 NOTE — Addendum Note (Signed)
Addended by: Theressa Stamps on: 03/11/2023 01:15 PM   Modules accepted: Orders

## 2023-03-12 ENCOUNTER — Telehealth: Payer: Self-pay | Admitting: Internal Medicine

## 2023-03-12 NOTE — Telephone Encounter (Signed)
Copied from CRM [id number]. Topic: Appointments - Appointment Scheduling >> Mar 12, 2023  8:08 AM [name] wrote: Patient/patient representative is calling to schedule an appointment. Refer to attachments for appointment information.   Spouse is requesting to speak with Cala Bradford, RN in regard to scheduling patients STAT x-ray.

## 2023-03-16 ENCOUNTER — Ambulatory Visit (HOSPITAL_COMMUNITY)
Admission: RE | Admit: 2023-03-16 | Discharge: 2023-03-16 | Disposition: A | Discharge status: Home / Self Care | Payer: Medicare Other | Source: Ambulatory Visit | Attending: Family Medicine | Admitting: Family Medicine

## 2023-03-16 DIAGNOSIS — R0789 Other chest pain: Secondary | ICD-10-CM | POA: Insufficient documentation

## 2023-03-16 DIAGNOSIS — R7989 Other specified abnormal findings of blood chemistry: Secondary | ICD-10-CM | POA: Insufficient documentation

## 2023-03-16 DIAGNOSIS — I700 Atherosclerosis of aorta: Secondary | ICD-10-CM | POA: Diagnosis not present

## 2023-03-16 DIAGNOSIS — R918 Other nonspecific abnormal finding of lung field: Secondary | ICD-10-CM | POA: Diagnosis not present

## 2023-03-16 DIAGNOSIS — J8410 Pulmonary fibrosis, unspecified: Secondary | ICD-10-CM | POA: Diagnosis not present

## 2023-03-16 DIAGNOSIS — I2510 Atherosclerotic heart disease of native coronary artery without angina pectoris: Secondary | ICD-10-CM | POA: Diagnosis not present

## 2023-03-16 DIAGNOSIS — R9389 Abnormal findings on diagnostic imaging of other specified body structures: Secondary | ICD-10-CM | POA: Diagnosis not present

## 2023-03-16 MED ORDER — IOHEXOL 350 MG/ML SOLN
100.0000 mL | Freq: Once | INTRAVENOUS | Status: AC | PRN
  Administered 2023-03-16: 100 mL via INTRAVENOUS

## 2023-03-16 MED ORDER — SODIUM CHLORIDE (PF) 0.9 % IJ SOLN
INTRAMUSCULAR | Status: AC
  Filled 2023-03-16: qty 50

## 2023-03-16 MED ORDER — IOHEXOL 300 MG/ML  SOLN: 100.0000 mL | Freq: Once | INTRAMUSCULAR | Status: DC | PRN

## 2023-03-17 ENCOUNTER — Other Ambulatory Visit (HOSPITAL_COMMUNITY): Payer: Self-pay | Admitting: Family Medicine

## 2023-03-17 DIAGNOSIS — J189 Pneumonia, unspecified organism: Secondary | ICD-10-CM

## 2023-03-17 DIAGNOSIS — R918 Other nonspecific abnormal finding of lung field: Secondary | ICD-10-CM

## 2023-03-19 ENCOUNTER — Encounter: Payer: Self-pay | Admitting: Internal Medicine

## 2023-03-19 ENCOUNTER — Ambulatory Visit (INDEPENDENT_AMBULATORY_CARE_PROVIDER_SITE_OTHER): Payer: Medicare Other | Admitting: Internal Medicine

## 2023-03-19 VITALS — BP 136/70 | HR 64 | Temp 98.1°F | Ht 72.0 in | Wt 168.0 lb

## 2023-03-19 DIAGNOSIS — R739 Hyperglycemia, unspecified: Secondary | ICD-10-CM

## 2023-03-19 DIAGNOSIS — E538 Deficiency of other specified B group vitamins: Secondary | ICD-10-CM

## 2023-03-19 DIAGNOSIS — I10 Essential (primary) hypertension: Secondary | ICD-10-CM | POA: Diagnosis not present

## 2023-03-19 DIAGNOSIS — R918 Other nonspecific abnormal finding of lung field: Secondary | ICD-10-CM

## 2023-03-19 DIAGNOSIS — Z0000 Encounter for general adult medical examination without abnormal findings: Secondary | ICD-10-CM | POA: Diagnosis not present

## 2023-03-19 DIAGNOSIS — Z0001 Encounter for general adult medical examination with abnormal findings: Secondary | ICD-10-CM

## 2023-03-19 NOTE — Progress Notes (Unsigned)
Patient ID: Stephen Meza, male   DOB: 10-05-1933, 89 y.o.   MRN: [id number]         Chief Complaint:: wellness exam and recent prod cough, right perihilar lung mass       HPI:  Stephen Meza is a 89 y.o. male here for wellness exam; declines covid booster, o/w up to date                        Also s/p recent prod cough with feverish seen initially at [facility] jan 9 tx with doxycycline for possible right CAP, and f/u jan 15 CXR with persistent right perihilar density and elevated d dimer; CTA chest 1/21 neg for PE, but with persistent right sided perihilar mass, CAD, aortic atherosclerosis.  Pt himself much improved with minor now non prod cough.  Pt denies chest pain, increased sob or doe, wheezing, orthopnea, PND, increased LE swelling, palpitations, dizziness or syncope.   Pt denies polydipsia, polyuria, or new focal neuro s/s.    Pt denies fever, wt loss, night sweats, loss of appetite, or other constitutional symptoms     Wt Readings from Last 3 Encounters:  03/19/23 168 lb (76.2 kg)  03/10/23 171 lb (77.6 kg)  11/10/22 174 lb (78.9 kg)   BP Readings from Last 3 Encounters:  03/19/23 136/70  03/10/23 130/86  11/10/22 120/78   Immunization History  Administered Date(s) Administered   Fluad Quad(high Dose 65+) 10/27/2018, 11/27/2019, 11/01/2020, 11/07/2021   Fluad Trivalent(High Dose 65+) 11/10/2022   Influenza Whole 12/23/2006, 12/17/2008, 11/27/2011   Influenza, High Dose Seasonal PF 12/16/2017, 10/31/2018   Influenza-Unspecified 12/08/2014, 11/23/2016   PFIZER(Purple Top)SARS-COV-2 Vaccination 03/11/2019, 04/01/2019, 12/08/2019, 09/25/2020   Pneumococcal Conjugate-13 05/01/2013   Pneumococcal Polysaccharide-23 12/23/2006   Respiratory Syncytial Virus Vaccine,Recomb Aduvanted(Arexvy) 11/19/2021   Td 10/23/2008   Tdap 04/06/2019   Zoster Recombinant(Shingrix) 08/03/2017, 10/09/2017   Zoster, Live 12/23/2006   Health Maintenance Due  Topic Date Due   COVID-19 Vaccine (5  - 2024-25 season) 10/25/2022      Past Medical History:  Diagnosis Date   DUODENITIS, WITHOUT HEMORRHAGE 07/14/2007   ERECTILE DYSFUNCTION 07/14/2007   Esophageal stricture 03/19/2016   ESOPHAGITIS 07/14/2007   EXTERNAL HEMORRHOIDS 07/14/2007   GERD 07/14/2007   Gout    HYPERTENSION 07/14/2007   PERIPHERAL NEUROPATHY 07/14/2007   Peripheral neuropathy 04/06/2019   PERIPHERAL VASCULAR DISEASE 07/14/2007   PODAGRA 07/14/2007   Prostate cancer (HCC) 02/2012   pt had screening and then biopsy   Retinal tear of right eye 03/19/2016   TOBACCO ABUSE, HX OF 02/12/2010   TONSILLECTOMY, HX OF 07/14/2007   TREMOR, ESSENTIAL 07/14/2007   VITAMIN B12 DEFICIENCY 07/14/2007   Past Surgical History:  Procedure Laterality Date   Excision of lipoma     PROSTATE BIOPSY Bilateral 03/10/2012   surgery to reduce turbinate and straighten deviated septum  2016   TONSILLECTOMY      reports that he quit smoking about 29 years ago. His smoking use included cigarettes. He has quit using smokeless tobacco. He reports that he does not currently use alcohol. He reports that he does not use drugs. family history includes Breast cancer in his sister and another family member; Cancer in his mother; Hypothyroidism in his daughter; Other in his father. Allergies  Allergen Reactions   Nitrofuran Derivatives Rash   Ciprofloxacin Hcl Other (See Comments)    numbness   Current Outpatient Medications on File Prior to Visit  Medication Sig  Dispense Refill   allopurinol (ZYLOPRIM) 100 MG tablet TAKE 1 TABLET BY MOUTH EVERY DAY 90 tablet 4   amLODipine (NORVASC) 2.5 MG tablet Take 1 tablet (2.5 mg total) by mouth daily. 90 tablet 3   benzonatate (TESSALON) 100 MG capsule Take 100 mg by mouth 3 (three) times daily as needed.     Cyanocobalamin (B-12) 3000 MCG CAPS Take by mouth daily.     docusate sodium (COLACE) 100 MG capsule Take 100 mg by mouth once.     omeprazole (PRILOSEC) 40 MG capsule TAKE 1 CAPSULE BY MOUTH EVERY DAY 90  capsule 2   No current facility-administered medications on file prior to visit.        ROS:  All others reviewed and negative.  Objective        PE:  BP 136/70 (BP Location: Right Arm, Patient Position: Sitting, Cuff Size: Normal)   Pulse 64   Temp 98.1 F (36.7 C) (Oral)   Ht 6' (1.829 m)   Wt 168 lb (76.2 kg)   SpO2 96%   BMI 22.78 kg/m                 Constitutional: Pt appears in NAD               HENT: Head: NCAT.                Right Ear: External ear normal.                 Left Ear: External ear normal.                Eyes: . Pupils are equal, round, and reactive to light. Conjunctivae and EOM are normal               Nose: without d/c or deformity               Neck: Neck supple. Gross normal ROM               Cardiovascular: Normal rate and regular rhythm.                 Pulmonary/Chest: Effort normal and breath sounds without rales or wheezing.                Abd:  Soft, NT, ND, + BS, no organomegaly               Neurological: Pt is alert. At baseline orientation, motor grossly intact               Skin: Skin is warm. No rashes, no other new lesions, LE edema - none               Psychiatric: Pt behavior is normal without agitation   Micro: none  Cardiac tracings I have personally interpreted today:  none  Pertinent Radiological findings (summarize): none   Lab Results  Component Value Date   WBC 8.1 03/10/2023   HGB 11.3 (L) 03/10/2023   HCT 35.6 (L) 03/10/2023   PLT 311.0 03/10/2023   GLUCOSE 103 (H) 03/10/2023   CHOL 157 11/06/2022   TRIG 101.0 11/06/2022   HDL 50.30 11/06/2022   LDLDIRECT 93.0 03/15/2014   LDLCALC 86 11/06/2022   ALT 8 03/10/2023   AST 15 03/10/2023   NA 138 03/10/2023   K 4.4 03/10/2023   CL 100 03/10/2023   CREATININE 0.91 03/10/2023   BUN 14 03/10/2023   CO2 30 03/10/2023  TSH 3.09 05/08/2022   PSA 3.54 02/12/2010   HGBA1C 6.0 11/06/2022   MICROALBUR 0.9 05/08/2022   Assessment/Plan:  Stephen Meza is a 89  y.o. White or Caucasian [1] male with  has a past medical history of DUODENITIS, WITHOUT HEMORRHAGE (07/14/2007), ERECTILE DYSFUNCTION (07/14/2007), Esophageal stricture (03/19/2016), ESOPHAGITIS (07/14/2007), EXTERNAL HEMORRHOIDS (07/14/2007), GERD (07/14/2007), Gout, HYPERTENSION (07/14/2007), PERIPHERAL NEUROPATHY (07/14/2007), Peripheral neuropathy (04/06/2019), PERIPHERAL VASCULAR DISEASE (07/14/2007), PODAGRA (07/14/2007), Prostate cancer (HCC) (02/2012), Retinal tear of right eye (03/19/2016), TOBACCO ABUSE, HX OF (02/12/2010), TONSILLECTOMY, HX OF (07/14/2007), TREMOR, ESSENTIAL (07/14/2007), and VITAMIN B12 DEFICIENCY (07/14/2007).  Encounter for well adult exam with abnormal findings Age and sex appropriate education and counseling updated with regular exercise and diet Referrals for preventative services - none needed Immunizations addressed - none needed Smoking counseling  - none needed Evidence for depression or other mood disorder - none significant Most recent labs reviewed. I have personally reviewed and have noted: 1) the patient's medical and social history 2) The patient's current medications and supplements 3) The patient's height, weight, and BMI have been recorded in the chart   Essential hypertension BP Readings from Last 3 Encounters:  03/19/23 136/70  03/10/23 130/86  11/10/22 120/78   Stable, pt to continue medical treatment norvasc 2.5 qd   Hyperglycemia Lab Results  Component Value Date   HGBA1C 6.0 11/06/2022   Stable, pt to continue current medical treatment  - diet, wt control   Vitamin B12 deficiency Lab Results  Component Value Date   VITAMINB12 657 11/06/2022   Stable, cont oral replacement - b12 1000 mcg qd   Lung mass Pt with resolved infectious illness, but persistent right perihilar mass - d/w pt, wife, daughter, no need further lab or imaging - will need pulm referral and I suspect possible bronchoscopy for tissue diagnosis  Followup: Return in about  3 months (around 06/17/2023).  Oliver Barre, MD 03/22/2023 9:45 PM [facility] [place] [facility] [facility] - [facility]

## 2023-03-19 NOTE — Patient Instructions (Addendum)
Please continue all other medications as before, and refills have been done if requested.  Please have the pharmacy call with any other refills you may need.  Please continue your efforts at being more active, low cholesterol diet, and weight control.  Please keep your appointments with your specialists as you may have planned  You will be contacted regarding the referral for: Pulmonary - asap  Please make an Appointment to return in 3 months, or sooner if needed

## 2023-03-22 ENCOUNTER — Encounter: Payer: Self-pay | Admitting: Internal Medicine

## 2023-03-22 DIAGNOSIS — R918 Other nonspecific abnormal finding of lung field: Secondary | ICD-10-CM | POA: Insufficient documentation

## 2023-03-22 NOTE — Assessment & Plan Note (Signed)
BP Readings from Last 3 Encounters:  03/19/23 136/70  03/10/23 130/86  11/10/22 120/78   Stable, pt to continue medical treatment norvasc 2.5 qd

## 2023-03-22 NOTE — Assessment & Plan Note (Signed)
Lab Results  Component Value Date   VITAMINB12 657 11/06/2022   Stable, cont oral replacement - b12 1000 mcg qd

## 2023-03-22 NOTE — Assessment & Plan Note (Signed)

## 2023-03-22 NOTE — Assessment & Plan Note (Signed)
Pt with resolved infectious illness, but persistent right perihilar mass - d/w pt, wife, daughter, no need further lab or imaging - will need pulm referral and I suspect possible bronchoscopy for tissue diagnosis

## 2023-03-22 NOTE — Assessment & Plan Note (Signed)
Lab Results  Component Value Date   HGBA1C 6.0 11/06/2022   Stable, pt to continue current medical treatment  - diet, wt control

## 2023-03-26 ENCOUNTER — Telehealth: Payer: Self-pay | Admitting: Internal Medicine

## 2023-03-26 ENCOUNTER — Ambulatory Visit (HOSPITAL_BASED_OUTPATIENT_CLINIC_OR_DEPARTMENT_OTHER)
Admission: RE | Admit: 2023-03-26 | Discharge: 2023-03-26 | Disposition: A | Discharge status: Home / Self Care | Payer: Medicare Other | Source: Ambulatory Visit | Attending: Family Medicine | Admitting: Family Medicine

## 2023-03-26 DIAGNOSIS — R918 Other nonspecific abnormal finding of lung field: Secondary | ICD-10-CM | POA: Diagnosis not present

## 2023-03-26 DIAGNOSIS — J189 Pneumonia, unspecified organism: Secondary | ICD-10-CM | POA: Diagnosis present

## 2023-03-26 DIAGNOSIS — R590 Localized enlarged lymph nodes: Secondary | ICD-10-CM | POA: Diagnosis not present

## 2023-03-26 DIAGNOSIS — I700 Atherosclerosis of aorta: Secondary | ICD-10-CM | POA: Diagnosis not present

## 2023-03-26 MED ORDER — IOHEXOL 300 MG/ML  SOLN
75.0000 mL | Freq: Once | INTRAMUSCULAR | Status: AC | PRN
  Administered 2023-03-26: 75 mL via INTRAVENOUS

## 2023-03-26 NOTE — Telephone Encounter (Signed)
Copied from CRM [id number]. Topic: Clinical - Lab/Test Results >> Mar 26, 2023  3:43 PM Irine Seal wrote: Reason for CRM: Ruby with [place] radiology calling to make sure the results from CT Chest W Contrast (Accession [id number]) (Order [id number]) were received in patients chart, it needs to be brought to the providers attention regarding results

## 2023-03-29 ENCOUNTER — Ambulatory Visit (INDEPENDENT_AMBULATORY_CARE_PROVIDER_SITE_OTHER): Payer: Medicare Other | Admitting: Internal Medicine

## 2023-03-29 ENCOUNTER — Encounter: Payer: Self-pay | Admitting: Internal Medicine

## 2023-03-29 VITALS — BP 132/66 | HR 88 | Temp 97.6°F | Ht 72.0 in | Wt 167.0 lb

## 2023-03-29 DIAGNOSIS — R0689 Other abnormalities of breathing: Secondary | ICD-10-CM | POA: Diagnosis not present

## 2023-03-29 DIAGNOSIS — R918 Other nonspecific abnormal finding of lung field: Secondary | ICD-10-CM | POA: Diagnosis not present

## 2023-03-29 DIAGNOSIS — R739 Hyperglycemia, unspecified: Secondary | ICD-10-CM

## 2023-03-29 DIAGNOSIS — I10 Essential (primary) hypertension: Secondary | ICD-10-CM | POA: Diagnosis not present

## 2023-03-29 MED ORDER — BENZONATATE 100 MG PO CAPS: 100.0000 mg | ORAL_CAPSULE | Freq: Three times a day (TID) | ORAL | 1 refills | Status: DC | PRN

## 2023-03-29 MED ORDER — AZITHROMYCIN 250 MG PO TABS: ORAL_TABLET | ORAL | 1 refills | Status: AC

## 2023-03-29 MED ORDER — HYDROCODONE BIT-HOMATROP MBR 5-1.5 MG/5ML PO SOLN: 5.0000 mL | Freq: Four times a day (QID) | ORAL | 0 refills | Status: AC | PRN

## 2023-03-29 NOTE — Assessment & Plan Note (Signed)
Pt afeb and non toxic, but has abnormal CT and can't r/o early post obstr pna - for antibx - zpack/cough med prn, f/u pulm as planned

## 2023-03-29 NOTE — Telephone Encounter (Signed)
Oh yes, the result is there as ordered per Sherrlyn Hock FNP.  Please to continue the same plan with f/u with pulmonary as already referred.  Thanks

## 2023-03-29 NOTE — Assessment & Plan Note (Signed)
BP Readings from Last 3 Encounters:  03/29/23 132/66  03/19/23 136/70  03/10/23 130/86   Stable, pt to continue medical treatment norvasc 2.5 qd

## 2023-03-29 NOTE — Assessment & Plan Note (Signed)
 Lab Results  Component Value Date   HGBA1C 6.0 11/06/2022   Stable, pt to continue current medical treatment  - diet, wt control

## 2023-03-29 NOTE — Progress Notes (Signed)
Patient ID: Stephen Meza A. Otterson, male   DOB: Nov 25, 1933, 89 y.o.   MRN: [id number]        Chief Complaint: follow up cough and lung mass       HPI:  Stephen Meza is a 89 y.o. male here with c/o mild worsening scant prod cough, sob x 3-4 days,  Pt denies chest pain,  orthopnea, PND, increased LE swelling, palpitations, dizziness or syncope.   Pt denies polydipsia, polyuria, or new focal neuro s/s.    Pt denies fever, wt loss, night sweats, loss of appetite, or other constitutional symptoms   Wt Readings from Last 3 Encounters:  03/29/23 167 lb (75.8 kg)  03/19/23 168 lb (76.2 kg)  03/10/23 171 lb (77.6 kg)   BP Readings from Last 3 Encounters:  03/29/23 132/66  03/19/23 136/70  03/10/23 130/86         Past Medical History:  Diagnosis Date   DUODENITIS, WITHOUT HEMORRHAGE 07/14/2007   ERECTILE DYSFUNCTION 07/14/2007   Esophageal stricture 03/19/2016   ESOPHAGITIS 07/14/2007   EXTERNAL HEMORRHOIDS 07/14/2007   GERD 07/14/2007   Gout    HYPERTENSION 07/14/2007   PERIPHERAL NEUROPATHY 07/14/2007   Peripheral neuropathy 04/06/2019   PERIPHERAL VASCULAR DISEASE 07/14/2007   PODAGRA 07/14/2007   Prostate cancer (HCC) 02/2012   pt had screening and then biopsy   Retinal tear of right eye 03/19/2016   TOBACCO ABUSE, HX OF 02/12/2010   TONSILLECTOMY, HX OF 07/14/2007   TREMOR, ESSENTIAL 07/14/2007   VITAMIN B12 DEFICIENCY 07/14/2007   Past Surgical History:  Procedure Laterality Date   Excision of lipoma     PROSTATE BIOPSY Bilateral 03/10/2012   surgery to reduce turbinate and straighten deviated septum  2016   TONSILLECTOMY      reports that he quit smoking about 29 years ago. His smoking use included cigarettes. He has quit using smokeless tobacco. He reports that he does not currently use alcohol. He reports that he does not use drugs. family history includes Breast cancer in his sister and another family member; Cancer in his mother; Hypothyroidism in his daughter; Other in his  father. Allergies  Allergen Reactions   Nitrofuran Derivatives Rash   Ciprofloxacin Hcl Other (See Comments)    numbness   Current Outpatient Medications on File Prior to Visit  Medication Sig Dispense Refill   allopurinol (ZYLOPRIM) 100 MG tablet TAKE 1 TABLET BY MOUTH EVERY DAY 90 tablet 4   amLODipine (NORVASC) 2.5 MG tablet Take 1 tablet (2.5 mg total) by mouth daily. 90 tablet 3   Cyanocobalamin (B-12) 3000 MCG CAPS Take by mouth daily.     docusate sodium (COLACE) 100 MG capsule Take 100 mg by mouth once.     omeprazole (PRILOSEC) 40 MG capsule TAKE 1 CAPSULE BY MOUTH EVERY DAY 90 capsule 2   No current facility-administered medications on file prior to visit.        ROS:  All others reviewed and negative.  Objective        PE:  BP 132/66 (BP Location: Left Arm, Patient Position: Sitting, Cuff Size: Large)   Pulse 88   Temp 97.6 F (36.4 C) (Temporal)   Ht 6' (1.829 m)   Wt 167 lb (75.8 kg)   SpO2 94%   BMI 22.65 kg/m                 Constitutional: Pt appears non toxic  HENT: Head: NCAT.                Right Ear: External ear normal.                 Left Ear: External ear normal.                Eyes: . Pupils are equal, round, and reactive to light. Conjunctivae and EOM are normal               Nose: without d/c or deformity               Neck: Neck supple. Gross normal ROM               Cardiovascular: Normal rate and regular rhythm.                 Pulmonary/Chest: Effort normal and breath sounds with few RLL rales or wheezing.                Abd:  Soft, NT, ND, + BS, no organomegaly               Neurological: Pt is alert. At baseline orientation, motor grossly intact               Skin: Skin is warm. No rashes, no other new lesions, LE edema - none               Psychiatric: Pt behavior is normal without agitation   Micro: none  Cardiac tracings I have personally interpreted today:  none  Pertinent Radiological findings (summarize): Mar 26 2023  CT chest w CM -  Narrative & Impression  CLINICAL DATA:  Pneumonia.   EXAM: CT CHEST WITH CONTRAST   TECHNIQUE: Multidetector CT imaging of the chest was performed during intravenous contrast administration.   RADIATION DOSE REDUCTION: This exam was performed according to the departmental dose-optimization program which includes automated exposure control, adjustment of the mA and/or kV according to patient size and/or use of iterative reconstruction technique.   CONTRAST:  75mL OMNIPAQUE IOHEXOL 300 MG/ML  SOLN   COMPARISON:  March 16, 2023.   FINDINGS: Cardiovascular: Atherosclerosis of thoracic aorta is noted without aneurysm or dissection. Normal cardiac size. No pericardial effusion.   Mediastinum/Nodes: Thyroid gland and esophagus are unremarkable. 12 mm precarinal lymph node is noted consistent with malignancy. 12 mm subcarinal lymph node is noted consistent with malignancy.   Lungs/Pleura: No pneumothorax or pleural effusion is noted. Grossly stable 6.3 x 5.2 cm right perihilar mass is noted which extends into the right upper and lower lobes, most consistent with malignancy. Some degree of postobstructive atelectasis or pneumonia is noted in right lower lobe. Scarring is noted throughout both lungs peripherally.   Upper Abdomen: No acute abnormality.   Musculoskeletal: No chest wall abnormality. No acute or significant osseous findings.   IMPRESSION: 6.3 x 5.2 cm right perihilar mass is noted which extends into right upper and lower lobes, most consistent with malignancy. Biopsy via bronchoscopy is recommended. Mildly enlarged carinal and subcarinal lymph nodes are noted concerning for metastatic disease. Some degree of postobstructive atelectasis or pneumonia may be present in the right lower lobe     Lab Results  Component Value Date   WBC 8.1 03/10/2023   HGB 11.3 (L) 03/10/2023   HCT 35.6 (L) 03/10/2023   PLT 311.0 03/10/2023   GLUCOSE 103 (H)  03/10/2023   CHOL 157 11/06/2022   TRIG 101.0 11/06/2022  HDL 50.30 11/06/2022   LDLDIRECT 93.0 03/15/2014   LDLCALC 86 11/06/2022   ALT 8 03/10/2023   AST 15 03/10/2023   NA 138 03/10/2023   K 4.4 03/10/2023   CL 100 03/10/2023   CREATININE 0.91 03/10/2023   BUN 14 03/10/2023   CO2 30 03/10/2023   TSH 3.09 05/08/2022   PSA 3.54 02/12/2010   HGBA1C 6.0 11/06/2022   MICROALBUR 0.9 05/08/2022   Assessment/Plan:  Antwian A. Kawahara is a 89 y.o. White or Caucasian [1] male with  has a past medical history of DUODENITIS, WITHOUT HEMORRHAGE (07/14/2007), ERECTILE DYSFUNCTION (07/14/2007), Esophageal stricture (03/19/2016), ESOPHAGITIS (07/14/2007), EXTERNAL HEMORRHOIDS (07/14/2007), GERD (07/14/2007), Gout, HYPERTENSION (07/14/2007), PERIPHERAL NEUROPATHY (07/14/2007), Peripheral neuropathy (04/06/2019), PERIPHERAL VASCULAR DISEASE (07/14/2007), PODAGRA (07/14/2007), Prostate cancer (HCC) (02/2012), Retinal tear of right eye (03/19/2016), TOBACCO ABUSE, HX OF (02/12/2010), TONSILLECTOMY, HX OF (07/14/2007), TREMOR, ESSENTIAL (07/14/2007), and VITAMIN B12 DEFICIENCY (07/14/2007).  Abnormal breath sounds Pt afeb and non toxic, but has abnormal CT and can't r/o early post obstr pna - for antibx - zpack/cough med prn, f/u pulm as planned  Essential hypertension BP Readings from Last 3 Encounters:  03/29/23 132/66  03/19/23 136/70  03/10/23 130/86   Stable, pt to continue medical treatment norvasc 2.5 qd   Hyperglycemia Lab Results  Component Value Date   HGBA1C 6.0 11/06/2022   Stable, pt to continue current medical treatment  - diet, wt control   Lung mass Long discussion with wife and daughter again; pt likely to need bronchoscopy w/ biopsy for diagnosis, has initial visit with pulm for feb 17 planned  Followup: Return if symptoms worsen or fail to improve.  Oliver Barre, MD 03/29/2023 8:07 PM [facility] [place] [facility] - [facility]

## 2023-03-29 NOTE — Patient Instructions (Addendum)
Please take all new medication as prescribed - the antibiotic, and cough medicine as needed  Please continue all other medications as before, and refills have been done if requested.  Please have the pharmacy call with any other refills you may need.  Please continue your efforts at being more active, low cholesterol diet, and weight control.  Please keep your appointments with your specialists as you may have planned - Pulmonary on Feb 17

## 2023-03-29 NOTE — Assessment & Plan Note (Signed)
Long discussion with wife and daughter again; pt likely to need bronchoscopy w/ biopsy for diagnosis, has initial visit with pulm for feb 17 planned

## 2023-03-31 ENCOUNTER — Other Ambulatory Visit: Payer: Self-pay

## 2023-03-31 ENCOUNTER — Other Ambulatory Visit: Payer: Self-pay | Admitting: Internal Medicine

## 2023-04-05 ENCOUNTER — Telehealth: Payer: Self-pay | Admitting: Internal Medicine

## 2023-04-05 NOTE — Telephone Encounter (Signed)
 Copied from CRM [id number]. Topic: Clinical - Medical Advice >> Apr 05, 2023 10:45 AM [place] J wrote: Reason for CRM: Patient's wife calling in to speak to head nurse [place] / Would like a call back regarding discussion they had with [place] Wants to know if a PET scan is an option since patient has had cancer previously.  [place] [phone]

## 2023-04-12 ENCOUNTER — Encounter: Payer: Self-pay | Admitting: Acute Care

## 2023-04-12 ENCOUNTER — Ambulatory Visit: Payer: Medicare Other | Admitting: Acute Care

## 2023-04-12 VITALS — BP 136/82 | HR 89 | Ht 72.0 in | Wt 170.6 lb

## 2023-04-12 DIAGNOSIS — Z8551 Personal history of malignant neoplasm of bladder: Secondary | ICD-10-CM

## 2023-04-12 DIAGNOSIS — R918 Other nonspecific abnormal finding of lung field: Secondary | ICD-10-CM

## 2023-04-12 DIAGNOSIS — Z87891 Personal history of nicotine dependence: Secondary | ICD-10-CM

## 2023-04-12 NOTE — Patient Instructions (Addendum)
 It is good to see you today. We will order a PET scan to better evaluate the lung nodule of concern. I will have you follow up with me 1-2 weeks after the scan has been completed.  We review the results we can determine next best steps . Call if you need [place] sooner Please contact office for sooner follow up if symptoms do not improve or worsen or seek emergency care

## 2023-04-12 NOTE — Progress Notes (Signed)
 History of Present Illness Stephen Meza is a 89 y.o. male former smoker  with history of bladder cancer referred 03/2023 for a lung mass. He will be followed by Dr. Delton Coombes.   04/12/2023 Stephen Meza is an 89 year old male with a history of bladder cancer who presents with a lung nodule found incidentally during work up for pneumonia . He is accompanied by his family members, his  wife and 2 daughters..  An 89 year old male presents for evaluation of a lung nodule incidentally discovered when a CT Angio done 02/2023 to evaluate for pneumonia. He was treated with antibiotics( family are unsure of which one) which he states resolved his symptoms.He denies any fever or purulent secretions. He had follow up CT chest  03/26/2023. The nodule identified on a CT Angio   March 16, 2023, showed a 7.9 by 5.5 cm mass. A follow-up CT scan with contrast on March 26, 2023, revealed a 6.3 by 5.2 cm right parahilar mass, so slightly smaller on follow up. but the nodule persisted.There is also suspicion of lymph node involvement. No pain is associated with the nodule.Pt. is active and states he is doing well from breathing perspective.  He has a history of bladder cancer diagnosed in 2017, treated with a scraping procedure. He follows up annually with a urologist and has not had any recurrence of bladder cancer. No PET scan has been performed since his bladder cancer diagnosis.  He reports a weight loss of approximately 12 pounds over the past year, from 182 to 170 pounds. No significant hemoptysis, or other symptoms. He notes some shortness of breath with exertion, such as yard work or vacuuming, but no significant breathing issues otherwise.  He quit smoking approximately 30 years ago, having smoked about three-fourths of a pack per day for around 33 years.  He is married, lives with is wife. He worked for [employer] in [place] his entire career. 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 He has  2 daughters. They are here with him today, as is his wife.   We discussed that as his lung nodule has persisted, despite antibiotic treatment. He does not have any clinical signs of infection. With the history of bladder cancer, I will do a PET scan to better evaluate the lung nodule.   His mother died of cancer, his sister died of breast cancer. There is an additional breast cancer, but the relative has not been identified.   Test Results: CTA 03/16/2023 Masslike consolidation in the central right lower lobe extending from the right hilum into the superior segment of the right lower lobe, measuring 7.9 x 5.5 cm. While this could reflect pneumonia, appearance is concerning for tumor/malignancy. Areas of peripheral fibrosis in the lungs bilaterally.  Masslike opacity in the right hilum and central right lower lobe could reflect pneumonia, but appearance is concerning for malignancy. Followup CT is recommended in 3-4 weeks following trial of antibiotic therapy to assess for resolution.  Areas of peripheral fibrosis throughout the lungs bilaterally.  03/26/2023 CT Chest with Contrast Thyroid gland and esophagus are unremarkable. 12 mm precarinal lymph node is noted consistent with malignancy. 12 mm subcarinal lymph node is noted consistent with malignancy.   Lungs/Pleura: No pneumothorax or pleural effusion is noted. Grossly stable 6.3 x 5.2 cm right perihilar mass is noted which extends into the right upper and lower lobes, most consistent with malignancy. Some degree of postobstructive atelectasis or pneumonia is noted in right lower lobe. Scarring is  noted throughout both lungs peripherally. 6.3 x 5.2 cm right perihilar mass is noted which extends into right upper and lower lobes, most consistent with malignancy. Biopsy via bronchoscopy is recommended. Mildly enlarged carinal and subcarinal lymph nodes are noted concerning for metastatic disease. Some degree of postobstructive atelectasis or  pneumonia may be present in the right lower lobe.     Latest Ref Rng & Units 03/10/2023   11:12 AM 11/06/2022   12:32 PM 05/08/2022    1:08 PM  CBC  WBC 4.0 - 10.5 K/uL 8.1  6.2  5.8   Hemoglobin 13.0 - 17.0 g/dL 40.9  81.1  91.4   Hematocrit 39.0 - 52.0 % 35.6  35.1  36.4   Platelets 150.0 - 400.0 K/uL 311.0  238.0  214.0        Latest Ref Rng & Units 03/10/2023   11:12 AM 11/06/2022   12:32 PM 05/08/2022    1:08 PM  BMP  Glucose 70 - 99 mg/dL 782  956  93   BUN 6 - 23 mg/dL 14  20  18    Creatinine 0.40 - 1.50 mg/dL 2.13  0.86  5.78   Sodium 135 - 145 mEq/L 138  138  139   Potassium 3.5 - 5.1 mEq/L 4.4  4.3  4.9   Chloride 96 - 112 mEq/L 100  102  102   CO2 19 - 32 mEq/L 30  29  28    Calcium 8.4 - 10.5 mg/dL 9.7  9.4  9.5     BNP No results found for: "BNP"  ProBNP No results found for: "PROBNP"  PFT No results found for: "FEV1PRE", "FEV1POST", "FVCPRE", "FVCPOST", "TLC", "DLCOUNC", "PREFEV1FVCRT", "PSTFEV1FVCRT"  CT Chest W Contrast Result Date: 03/26/2023 CLINICAL DATA:  Pneumonia. EXAM: CT CHEST WITH CONTRAST TECHNIQUE: Multidetector CT imaging of the chest was performed during intravenous contrast administration. RADIATION DOSE REDUCTION: This exam was performed according to the departmental dose-optimization program which includes automated exposure control, adjustment of the mA and/or kV according to patient size and/or use of iterative reconstruction technique. CONTRAST:  75mL OMNIPAQUE IOHEXOL 300 MG/ML  SOLN COMPARISON:  March 16, 2023. FINDINGS: Cardiovascular: Atherosclerosis of thoracic aorta is noted without aneurysm or dissection. Normal cardiac size. No pericardial effusion. Mediastinum/Nodes: Thyroid gland and esophagus are unremarkable. 12 mm precarinal lymph node is noted consistent with malignancy. 12 mm subcarinal lymph node is noted consistent with malignancy. Lungs/Pleura: No pneumothorax or pleural effusion is noted. Grossly stable 6.3 x 5.2 cm right  perihilar mass is noted which extends into the right upper and lower lobes, most consistent with malignancy. Some degree of postobstructive atelectasis or pneumonia is noted in right lower lobe. Scarring is noted throughout both lungs peripherally. Upper Abdomen: No acute abnormality. Musculoskeletal: No chest wall abnormality. No acute or significant osseous findings. IMPRESSION: 6.3 x 5.2 cm right perihilar mass is noted which extends into right upper and lower lobes, most consistent with malignancy. Biopsy via bronchoscopy is recommended. Mildly enlarged carinal and subcarinal lymph nodes are noted concerning for metastatic disease. Some degree of postobstructive atelectasis or pneumonia may be present in the right lower lobe. These results will be called to the ordering clinician or representative by the Radiologist Assistant, and communication documented in the PACS or zVision Dashboard. Aortic Atherosclerosis (ICD10-I70.0). Electronically Signed   By: Lupita Raider M.D.   On: 03/26/2023 15:32   CT ANGIO CHEST AORTA W/CM & OR WO/CM Result Date: 03/16/2023 CLINICAL DATA:  Abnormal x-ray.  Right perihilar  consolidation. EXAM: CT ANGIOGRAPHY CHEST WITH CONTRAST TECHNIQUE: Multidetector CT imaging of the chest was performed using the standard protocol during bolus administration of intravenous contrast. Multiplanar CT image reconstructions and MIPs were obtained to evaluate the vascular anatomy. RADIATION DOSE REDUCTION: This exam was performed according to the departmental dose-optimization program which includes automated exposure control, adjustment of the mA and/or kV according to patient size and/or use of iterative reconstruction technique. CONTRAST:  OMNIPAQUE IOHEXOL 350 MG/ML SOLN COMPARISON:  Chest x-ray 03/10/2023 FINDINGS: Cardiovascular: No filling defects in the pulmonary arteries to suggest pulmonary emboli. Heart is normal size. Aorta is normal caliber. Mediastinum/Nodes: Scattered  coronary artery calcifications. Moderate aortic calcifications. Lungs/Pleura: No mediastinal, hilar, or axillary adenopathy. Trachea and esophagus are unremarkable. Thyroid unremarkable. Upper Abdomen: Masslike consolidation in the central right lower lobe extending from the right hilum into the superior segment of the right lower lobe, measuring 7.9 x 5.5 cm. While this could reflect pneumonia, appearance is concerning for tumor/malignancy. Areas of peripheral fibrosis in the lungs bilaterally. No effusions. Musculoskeletal: No acute findings Review of the MIP images confirms the above findings. IMPRESSION: Chest wall soft tissues are unremarkable. No acute bony abnormality. Masslike opacity in the right hilum and central right lower lobe could reflect pneumonia, but appearance is concerning for malignancy. Followup CT is recommended in 3-4 weeks following trial of antibiotic therapy to assess for resolution. Scattered coronary artery disease. Areas of peripheral fibrosis throughout the lungs bilaterally. Aortic atherosclerosis. Electronically Signed   By: Charlett Nose M.D.   On: 03/16/2023 20:18     Past medical hx Past Medical History:  Diagnosis Date   DUODENITIS, WITHOUT HEMORRHAGE 07/14/2007   ERECTILE DYSFUNCTION 07/14/2007   Esophageal stricture 03/19/2016   ESOPHAGITIS 07/14/2007   EXTERNAL HEMORRHOIDS 07/14/2007   GERD 07/14/2007   Gout    HYPERTENSION 07/14/2007   PERIPHERAL NEUROPATHY 07/14/2007   Peripheral neuropathy 04/06/2019   PERIPHERAL VASCULAR DISEASE 07/14/2007   PODAGRA 07/14/2007   Prostate cancer (HCC) 02/2012   pt had screening and then biopsy   Retinal tear of right eye 03/19/2016   TOBACCO ABUSE, HX OF 02/12/2010   TONSILLECTOMY, HX OF 07/14/2007   TREMOR, ESSENTIAL 07/14/2007   VITAMIN B12 DEFICIENCY 07/14/2007     Social History   Tobacco Use   Smoking status: Former    Current packs/day: 0.00    Types: Cigarettes    Quit date: 02/23/1994    Years since quitting: 29.1    Smokeless tobacco: Former  Building services engineer status: Never Used  Substance Use Topics   Alcohol use: Not Currently    Comment: d/c beer about 5 months ago   Drug use: No    Mr.Glanz reports that he quit smoking about 29 years ago. His smoking use included cigarettes. He has quit using smokeless tobacco. He reports that he does not currently use alcohol. He reports that he does not use drugs.  Tobacco Cessation: Quit 1996 with a 21.8 pack year smoking history   Past surgical hx, Family hx, Social hx all reviewed.  Current Outpatient Medications on File Prior to Visit  Medication Sig   allopurinol (ZYLOPRIM) 100 MG tablet TAKE 1 TABLET BY MOUTH EVERY DAY   amLODipine (NORVASC) 2.5 MG tablet Take 1 tablet (2.5 mg total) by mouth daily.   Cyanocobalamin (B-12) 3000 MCG CAPS Take by mouth daily.   docusate sodium (COLACE) 100 MG capsule Take 100 mg by mouth once.   omeprazole (PRILOSEC) 40 MG capsule  TAKE 1 CAPSULE BY MOUTH EVERY DAY   No current facility-administered medications on file prior to visit.     Allergies  Allergen Reactions   Nitrofuran Derivatives Rash   Ciprofloxacin Hcl Other (See Comments)    numbness    Review Of Systems:  Constitutional:   +  weight loss, No night sweats,  Fevers, chills, fatigue, or  lassitude.  HEENT:   No headaches,  Difficulty swallowing,  Tooth/dental problems, or  Sore throat,                No sneezing, itching, ear ache, nasal congestion, post nasal drip,   CV:  No chest pain,  Orthopnea, PND, swelling in lower extremities, anasarca, dizziness, palpitations, syncope.   GI  No heartburn, indigestion, abdominal pain, nausea, vomiting, diarrhea, change in bowel habits, loss of appetite, bloody stools.   Resp: + shortness of breath with exertion less at rest.  No excess mucus, + productive cough,  No non-productive cough,  No coughing up of blood.  No change in color of mucus.  No wheezing.  No chest wall deformity  Skin: no  rash or lesions.  GU: no dysuria, change in color of urine, no urgency or frequency.  No flank pain, no hematuria   MS:  No joint pain or swelling.  + decreased range of motion.  No back pain.  Psych:  No change in mood or affect. No depression or anxiety.  No memory loss.   Vital Signs BP 136/82 (BP Location: Left Arm, Patient Position: Sitting, Cuff Size: Large)   Pulse 89   Ht 6' (1.829 m)   Wt 170 lb 9.6 oz (77.4 kg)   SpO2 97%   BMI 23.14 kg/m    Physical Exam:  General- No distress,  A&Ox3, pleasant ENT: No sinus tenderness, TM clear, pale nasal mucosa, no oral exudate,no post nasal drip, no LAN Cardiac: S1, S2, regular rate and rhythm, no murmur Chest: No wheeze/ rales/ dullness; no accessory muscle use, no nasal flaring, no sternal retractions Abd.: Soft Non-tender, ND, BS +, Body mass index is 23.14 kg/m.  Ext: No clubbing cyanosis, edema Neuro:  normal strength, MAE x 4, A&O x 3 Skin: No rashes, warm and dry, no lesions Psych: normal mood and behavior   Assessment/Plan Right Parahilar Mass Incidentally discovered on CT chest during pneumonia workup.  Two CT scans 03/16/2023 and 03/26/2023 showed a mass of  7.9x5.5 cm and 6.3x5.2 cm respectively. No associated symptoms.  History of bladder cancer in 2017. Mild weight loss over the past year. Plan -Order PET scan within the week to evaluate the mass and assess for possible malignancy or metastasis. -Depending on PET scan results, consider bronchoscopy for biopsy.  History of Bladder Cancer Diagnosed in 2017, treated with scraping. Annual follow-ups with urologist, no recent imaging. -Continue annual follow-ups with urologist. -PET scan will also assess for any recurrence or metastasis.  Pneumonia Recently treated with an unknown antibiotic, symptoms resolved. -No further action needed at this time. - Follow for symptoms, fever, purulent secretions  Fibrotic changes to lungs on CT Imaging Plan Follow up CT/  HRCT to further evaluate after lung mass evaluated.  Follow-up Schedule follow-up appointment 1-2 weeks after PET scan to discuss results and potential need for biopsy.   I spent 30 minutes dedicated to the care of this patient on the date of this encounter to include pre-visit review of records, face-to-face time with the patient discussing conditions above, post visit ordering of testing,  clinical documentation with the electronic health record, making appropriate referrals as documented, and communicating necessary information to the patient's healthcare team.   Bevelyn Ngo, NP 04/12/2023  4:26 PM

## 2023-04-22 ENCOUNTER — Encounter (HOSPITAL_COMMUNITY)
Admission: RE | Admit: 2023-04-22 | Discharge: 2023-04-22 | Disposition: A | Discharge status: Home / Self Care | Payer: Medicare Other | Source: Ambulatory Visit | Attending: Acute Care | Admitting: Acute Care

## 2023-04-22 DIAGNOSIS — R918 Other nonspecific abnormal finding of lung field: Secondary | ICD-10-CM | POA: Diagnosis not present

## 2023-04-22 LAB — GLUCOSE, CAPILLARY: Glucose-Capillary: 88 mg/dL (ref 70–99)

## 2023-04-22 MED ORDER — FLUDEOXYGLUCOSE F - 18 (FDG) INJECTION
8.5000 | Freq: Once | INTRAVENOUS | Status: AC
  Administered 2023-04-22: 8.45 via INTRAVENOUS

## 2023-04-26 ENCOUNTER — Encounter: Payer: Self-pay | Admitting: Emergency Medicine

## 2023-04-26 ENCOUNTER — Encounter: Payer: Self-pay | Admitting: Acute Care

## 2023-04-26 ENCOUNTER — Ambulatory Visit: Payer: Medicare Other | Admitting: Acute Care

## 2023-04-26 ENCOUNTER — Telehealth: Payer: Self-pay | Admitting: Acute Care

## 2023-04-26 ENCOUNTER — Encounter (HOSPITAL_COMMUNITY): Payer: Medicare Other

## 2023-04-26 VITALS — BP 157/96 | HR 98 | Ht 72.0 in | Wt 168.4 lb

## 2023-04-26 DIAGNOSIS — R918 Other nonspecific abnormal finding of lung field: Secondary | ICD-10-CM

## 2023-04-26 DIAGNOSIS — Z87891 Personal history of nicotine dependence: Secondary | ICD-10-CM

## 2023-04-26 NOTE — Telephone Encounter (Signed)
 Patient has bronch follow up appt scheduled for 4/1. Per Kandice Robinsons NP this is too far out and needs sooner bronch follow up appt. Patient has been rescheduled for 3/26 with Dr. Delton Coombes at [facility] and left VM for pt to inform.

## 2023-04-26 NOTE — Progress Notes (Signed)
 History of Present Illness Stephen Meza is a 89 y.o. male former smoker  with history of bladder cancer referred 03/2023 for a lung mass. He will be followed by Dr. Delton Coombes.  Synopsis Stephen Meza is an 89 year old male with a history of bladder cancer who presents with a lung nodule found incidentally during work up for pneumonia . He is accompanied by his family members, his  wife and 2 daughters..   An 89 year old male initially presented for evaluation of a lung nodule 04/12/2023, incidentally discovered when a CT Angio done 02/2023 to evaluate for pneumonia. He was treated with antibiotics( family are unsure of which one) which he states resolved his symptoms.He denies any fever or purulent secretions. He had follow up CT chest  03/26/2023. The nodule identified on a CT Angio   March 16, 2023, showed a 7.9 by 5.5 cm mass. A follow-up CT scan with contrast on March 26, 2023, revealed a 6.3 by 5.2 cm right parahilar mass, so slightly smaller on follow up. but the nodule persisted.There is also suspicion of lymph node involvement. No pain is associated with the nodule.Pt. is active and states he is doing well from breathing perspective.   He has a history of bladder cancer diagnosed in 2017, treated with a scraping procedure. He follows up annually with a urologist and has not had any recurrence of bladder cancer. No PET scan has been performed since his bladder cancer diagnosis.   He reports a weight loss of approximately 12 pounds over the past year, from 182 to 170 pounds. No significant hemoptysis, or other symptoms. He notes some shortness of breath with exertion, such as yard work or vacuuming, but no significant breathing issues otherwise.   He quit smoking approximately 30 years ago, having smoked about three-fourths of a pack per day for around 33 years.   He is married, lives with is wife. He worked for [employer] in [place] his entire career. 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 He has 2 daughters. They are here with him today, as is his wife.    We discussed that as his lung nodule has persisted, despite antibiotic treatment. He does not have any clinical signs of infection. With the history of bladder cancer, I will do a PET scan to better evaluate the lung nodule.    His mother died of cancer, his sister died of breast cancer. There is an additional breast cancer, but the relative has not been identified.   Plan after initial visit 04/12/2023 was for PET scan to better evaluate the nodule of concern. He is here today to go over results of the PET scan.     04/26/2023 Pt. Presents for follow up after PET scan. He states he has been doing well from a respiratory standpoint.  He is here with his wife and daughters today. Patient's PET scan shows hypermetabolism in the right hilar mass of concern, spanning the right upper and lower lobes. There is also hypermetabolism of the right lower paratracheal node, as well as th subcarinal nodes. This appears to be consistent with nodal metastasis.   We discussed in this setting the next step is to biopsy these areas . Both the patient and his family are in agreement with this option.  We discussed the risks to include bleeding, infection, pneumothorax, and adverse reaction to anesthesia.  They understand these risks and are in agreement with moving forward.  Family will provide transportation for the patient  to and from the hospital ,they will stay there during the procedure ,and they will maintain observation for the 24 hours after procedure.  Patient was provided with a letter and details of bronchoscopy with biopsies.  Patient not on any blood thinners.    Patient will have follow-up with either myself or Dr. Delton Coombes to review the results of the bronchoscopy and biopsies and to ensure he is done well postprocedure.  Test Results: PET scan 04/22/2023 Intensely hypermetabolic RIGHT hilar mass consistent with  primary bronchogenic carcinoma. Mass spans the RIGHT upper lobe and RIGHT lower lobe and constricts the RIGHT lower lobe bronchus 2. Hypermetabolic RIGHT lower paratracheal and subcarinal nodes consistent with nodal metastasis. 3. No evidence of distant metastatic disease. 4. Benign LEFT adrenal adenoma. 5.  Aortic Atherosclerosis (ICD10-I70.0).  03/26/2023 CT Chest 6.3 x 5.2 cm right perihilar mass is noted which extends into right upper and lower lobes, most consistent with malignancy. Biopsy via bronchoscopy is recommended. Mildly enlarged carinal and subcarinal lymph nodes are noted concerning for metastatic disease. Some degree of postobstructive atelectasis or pneumonia may be present in the right lower lobe.      Latest Ref Rng & Units 03/10/2023   11:12 AM 11/06/2022   12:32 PM 05/08/2022    1:08 PM  CBC  WBC 4.0 - 10.5 K/uL 8.1  6.2  5.8   Hemoglobin 13.0 - 17.0 g/dL 16.1  09.6  04.5   Hematocrit 39.0 - 52.0 % 35.6  35.1  36.4   Platelets 150.0 - 400.0 K/uL 311.0  238.0  214.0        Latest Ref Rng & Units 03/10/2023   11:12 AM 11/06/2022   12:32 PM 05/08/2022    1:08 PM  BMP  Glucose 70 - 99 mg/dL 409  811  93   BUN 6 - 23 mg/dL 14  20  18    Creatinine 0.40 - 1.50 mg/dL 9.14  7.82  9.56   Sodium 135 - 145 mEq/L 138  138  139   Potassium 3.5 - 5.1 mEq/L 4.4  4.3  4.9   Chloride 96 - 112 mEq/L 100  102  102   CO2 19 - 32 mEq/L 30  29  28    Calcium 8.4 - 10.5 mg/dL 9.7  9.4  9.5     BNP No results found for: "BNP"  ProBNP No results found for: "PROBNP"  PFT No results found for: "FEV1PRE", "FEV1POST", "FVCPRE", "FVCPOST", "TLC", "DLCOUNC", "PREFEV1FVCRT", "PSTFEV1FVCRT"  NM PET Image Initial (PI) Skull Base To Thigh Result Date: 04/26/2023 CLINICAL DATA:  Subsequent treatment strategy for chest mass. EXAM: NUCLEAR MEDICINE PET SKULL BASE TO THIGH TECHNIQUE: 8.5 mCi F-18 FDG was injected intravenously. Full-ring PET imaging was performed from the skull base to  thigh after the radiotracer. CT data was obtained and used for attenuation correction and anatomic localization. Fasting blood glucose: 88 mg/dl COMPARISON:  None Available. FINDINGS: Mediastinal blood pool activity: SUV max Liver activity: SUV max NA NECK: Incidental CT findings: None. CHEST: Persistent mass adjacent to the RIGHT hilum and spanning the RIGHT upper lobe and RIGHT lower lobe measures 7.6 x 5.6 cm compared to 7.7 x 5.0 cm for no significant change. The mass is intensely hypermetabolic. The mass constricts the RIGHT lobe bronchus and surrounds the of RIGHT upper lobe bronchus. Intensely hypermetabolic RIGHT lower paratracheal node measures 13 mm on image 52. Smaller hypermetabolic subcarinal node with mild metabolic activity (image 56. No contralateral hypermetabolic nodes. No hypermetabolic supraclavicular nodes. Incidental  CT findings: None. ABDOMEN/PELVIS: No abnormal hypermetabolic activity within the liver, pancreas, adrenal glands, or spleen. No hypermetabolic lymph nodes in the abdomen or pelvis. Moderate metabolic activity associated with the LEFT adrenal gland. Activity is greater than liver activity. The LEFT gland is mildly thickened 14 mm. The gland does have low density (HU equal 8) which is consistent benign adenoma. Incidental CT findings: Abdominal aorta measures 3.6 cm in diameter. Intimal calcification of SKELETON: No focal hypermetabolic activity to suggest skeletal metastasis. Incidental CT findings: None. IMPRESSION: 1. Intensely hypermetabolic RIGHT hilar mass consistent with primary bronchogenic carcinoma. Mass spans the RIGHT upper lobe and RIGHT lower lobe and constricts the RIGHT lower lobe bronchus 2. Hypermetabolic RIGHT lower paratracheal and subcarinal nodes consistent with nodal metastasis. 3. No evidence of distant metastatic disease. 4. Benign LEFT adrenal adenoma. 5.  Aortic Atherosclerosis (ICD10-I70.0). Electronically Signed   By: Genevive Bi M.D.   On:  04/26/2023 09:48     Past medical hx Past Medical History:  Diagnosis Date   DUODENITIS, WITHOUT HEMORRHAGE 07/14/2007   ERECTILE DYSFUNCTION 07/14/2007   Esophageal stricture 03/19/2016   ESOPHAGITIS 07/14/2007   EXTERNAL HEMORRHOIDS 07/14/2007   GERD 07/14/2007   Gout    HYPERTENSION 07/14/2007   PERIPHERAL NEUROPATHY 07/14/2007   Peripheral neuropathy 04/06/2019   PERIPHERAL VASCULAR DISEASE 07/14/2007   PODAGRA 07/14/2007   Prostate cancer (HCC) 02/2012   pt had screening and then biopsy   Retinal tear of right eye 03/19/2016   TOBACCO ABUSE, HX OF 02/12/2010   TONSILLECTOMY, HX OF 07/14/2007   TREMOR, ESSENTIAL 07/14/2007   VITAMIN B12 DEFICIENCY 07/14/2007     Social History   Tobacco Use   Smoking status: Former    Current packs/day: 0.75    Average packs/day: 0.8 packs/day for 29.2 years (21.9 ttl pk-yrs)    Types: Cigarettes    Start date: 02/23/1994   Smokeless tobacco: Former  Building services engineer status: Never Used  Substance Use Topics   Alcohol use: Not Currently    Comment: d/c beer about 5 months ago   Drug use: No    Mr.Bia reports that he has quit smoking. His smoking use included cigarettes. He started smoking about 29 years ago. He has a 21.9 pack-year smoking history. He has quit using smokeless tobacco. He reports that he does not currently use alcohol. He reports that he does not use drugs.  Tobacco Cessation: Counseling given: Not Answered Former smoker with a 22 pack year smoking history.  Past surgical hx, Family hx, Social hx all reviewed.  Current Outpatient Medications on File Prior to Visit  Medication Sig   allopurinol (ZYLOPRIM) 100 MG tablet TAKE 1 TABLET BY MOUTH EVERY DAY   amLODipine (NORVASC) 2.5 MG tablet Take 1 tablet (2.5 mg total) by mouth daily.   Cyanocobalamin (B-12) 3000 MCG CAPS Take by mouth daily.   docusate sodium (COLACE) 100 MG capsule Take 100 mg by mouth once.   omeprazole (PRILOSEC) 40 MG capsule TAKE 1 CAPSULE BY  MOUTH EVERY DAY   No current facility-administered medications on file prior to visit.     Allergies  Allergen Reactions   Nitrofuran Derivatives Rash   Ciprofloxacin Hcl Other (See Comments)    numbness    Review Of Systems:  Constitutional:   No  weight loss, night sweats,  Fevers, chills, fatigue, or  lassitude.  HEENT:   No headaches,  Difficulty swallowing,  Tooth/dental problems, or  Sore throat,  No sneezing, itching, ear ache, nasal congestion, post nasal drip,   CV:  No chest pain,  Orthopnea, PND, swelling in lower extremities, anasarca, dizziness, palpitations, syncope.   GI  No heartburn, indigestion, abdominal pain, nausea, vomiting, diarrhea, change in bowel habits, loss of appetite, bloody stools.   Resp: No shortness of breath with exertion or at rest.  No excess mucus, no productive cough,  No non-productive cough,  No coughing up of blood.  No change in color of mucus.  No wheezing.  No chest wall deformity  Skin: no rash or lesions.  GU: no dysuria, change in color of urine, no urgency or frequency.  No flank pain, no hematuria   MS:  No joint pain or swelling.  No decreased range of motion.  No back pain.  Psych:  No change in mood or affect. No depression or anxiety.  No memory loss.   Vital Signs BP (!) 157/96 (BP Location: Left Arm, Patient Position: Sitting, Cuff Size: Large)   Pulse 98   Ht 6' (1.829 m)   Wt 168 lb 6.4 oz (76.4 kg)   SpO2 97%   BMI 22.84 kg/m    Physical Exam:  General- No distress,  A&Ox3, pleasant ENT: No sinus tenderness, TM clear, pale nasal mucosa, no oral exudate,no post nasal drip, no LAN Cardiac: S1, S2, regular rate and rhythm, no murmur Chest: No wheeze/ rales/ dullness; no accessory muscle use, no nasal flaring, no sternal retractions, diminished per bases Abd.: Soft Non-tender, ND, BS +, Body mass index is 22.84 kg/m.  Ext: No clubbing cyanosis, edema Neuro:  normal strength, MAE x 4, A&O x 3,  appropriate Skin: No rashes, warm and dry, no lesions  Psych: normal mood and behavior   Assessment/Plan 6.3 x 5.2 cm right perihilar mass concerning for malignancy on PET.   Mildly enlarged carinal and subcarinal lymph nodes are noted concerning for metastatic disease.  Plan Your PET scan showed the area we have been monitoring is concerning for a cancer. We will move forward with a biopsy. You will get a letter with all the details of the procedure. You will need someone to drive you to the hospital , stay during the procedure, and drive you home. You will also need someone to stay with you after the procedure x 24 hours.  You are not on any blood thinners, which is good. You will follow up with me after the biopsy , so we can go over the results and determine best plan of care.  We have reviewed the risks and benefits of the biopsy and you have agreed to move forward.  Please contact office for sooner follow up if symptoms do not improve or worsen or seek emergency care    I spent 35 minutes dedicated to the care of this patient on the date of this encounter to include pre-visit review of records, face-to-face time with the patient discussing conditions above, post visit ordering of testing, clinical documentation with the electronic health record, making appropriate referrals as documented, and communicating necessary information to the patient's healthcare team.    Bevelyn Ngo, NP 04/26/2023  1:57 PM

## 2023-04-26 NOTE — Patient Instructions (Addendum)
 It is good to see you today. Your PET scan showed the area we have been monitoring is concerning for a cancer. We will move forward with a biopsy. You will get a letter with all the details of the procedure. You will need someone to drive you to the hospital , stay during the procedure, and drive you home. You will also need someone to stay with you after the procedure x 24 hours.  You are not on any blood thinners, which is good. You will follow up with me after the biopsy , so we can go over the results and determine best plan of care.  We have reviewed the risks and benefits of the biopsy and you have agreed to move forward.  Please contact office for sooner follow up if symptoms do not improve or worsen or seek emergency care

## 2023-04-27 ENCOUNTER — Ambulatory Visit: Payer: Medicare Other | Admitting: Orthopedic Surgery

## 2023-04-27 DIAGNOSIS — B351 Tinea unguium: Secondary | ICD-10-CM | POA: Diagnosis not present

## 2023-04-27 DIAGNOSIS — I872 Venous insufficiency (chronic) (peripheral): Secondary | ICD-10-CM

## 2023-05-05 ENCOUNTER — Telehealth: Payer: Self-pay | Admitting: Family Medicine

## 2023-05-05 NOTE — Telephone Encounter (Signed)
 Copied from CRM [id number]. Topic: Clinical - Request for Lab/Test Order >> May 05, 2023 12:31 PM Stephen Meza wrote: Reason for CRM: Patient is calling to verify if orders in system are still good to use for his appointment with [name] on 3/25 even though Dr. Jonny Ruiz was his primary care doctor and originally put the orders in, if not patient would like to have labs done before appointment on 3/25.

## 2023-05-05 NOTE — Telephone Encounter (Signed)
 LVM for patient to return call.

## 2023-05-06 ENCOUNTER — Encounter (HOSPITAL_COMMUNITY): Payer: Self-pay | Admitting: Emergency Medicine

## 2023-05-06 ENCOUNTER — Other Ambulatory Visit: Payer: Self-pay

## 2023-05-06 NOTE — Progress Notes (Addendum)
 SDW CALL  Patient's wife Wissam Resor, was given pre-op instructions over the phone. The opportunity was given for Mrs. Gruhn to ask questions. No further questions asked. Patient's wife  verbalized understanding of instructions given.   PCP - Ellin Goodie Cardiologist -   PPM/ICD - denies Device Orders -  Rep Notified -   Chest x-ray - CT-03/26/23 EKG - 03/10/23 Stress Test - denies ECHO - denies Cardiac Cath - denies  Sleep Study - denies CPAP -   Fasting Blood Sugar - na Checks Blood Sugar _____ times a day  Blood Thinner Instructions:na Aspirin Instructions:na   COVID TEST-na   Anesthesia review:   Patient's wife denies that patient has  fever, cough and chest pain over the phone call    Surgical Instructions    Your procedure is scheduled on Monday March 17  Report to [facility] Main Entrance "A" at 0830 A.M., then check in with the Admitting office.  Call this number if you have problems the morning of surgery:  [phone]    Remember:  Do not eat or drink anything after midnight the night before your surgery   Take these medicines the morning of surgery with A SIP OF WATER: Allopurinol,Amlodipine,Omeprazole  As of today, STOP taking any Aspirin (unless otherwise instructed by your surgeon) Aleve, Naproxen, Ibuprofen, Motrin, Advil, Goody's, BC's, all herbal medications, fish oil, and all vitamins.   is not responsible for any belongings or valuables. .   Do NOT Smoke (Tobacco/Vaping)  24 hours prior to your procedure  If you use a CPAP at night, you may bring your mask for your overnight stay.   Contacts, glasses, hearing aids, dentures or partials may not be worn into surgery, please bring cases for these belongings   Patients discharged the day of surgery will not be allowed to drive home, and someone needs to stay with them for 24 hours.   SURGICAL WAITING ROOM VISITATION You may have 1 visitor in the pre-op  area at a time determined by the pre-op nurse. (Visitor may not switch out) Patients having surgery or a procedure in a hospital may have two support people in the waiting room. Children under the age of 19 must have an adult with them who is not the patient. They may stay in the waiting area during the procedure and may switch out with other visitors. If the patient needs to stay at the hospital during part of their recovery, the visitor guidelines for inpatient rooms apply.  Please refer to the [facility] website for the visitor guidelines for Inpatients (after your surgery is over and you are in a regular room).     Special instructions:    Oral Hygiene is also important to reduce your risk of infection.  Remember - BRUSH YOUR TEETH THE MORNING OF SURGERY WITH YOUR REGULAR TOOTHPASTE   Day of Surgery:  Take a shower the day of or night before with antibacterial soap. Wear Clean/Comfortable clothing the morning of surgery Do not apply any deodorants/lotions.   Do not wear jewelry or makeup Do not wear lotions, powders, perfumes/colognes, or deodorant. Do not shave 48 hours prior to surgery.  Men may shave face and neck. Do not bring valuables to the hospital. Do not wear nail polish, gel polish, artificial nails, or any other type of covering on natural nails (fingers and toes) If you have artificial nails or gel coating that need to be removed by a nail salon, please have this removed prior to  surgery. Artificial nails or gel coating may interfere with anesthesia's ability to adequately monitor your vital signs. Remember to brush your teeth WITH YOUR REGULAR TOOTHPASTE.

## 2023-05-07 ENCOUNTER — Telehealth: Payer: Self-pay

## 2023-05-07 NOTE — Telephone Encounter (Signed)
 Copied from CRM [id number]. Topic: Clinical - Request for Lab/Test Order >> May 07, 2023 11:04 AM Drema Balzarine wrote: Reason for CRM: Patient spouse called wants to make sure orders for Dr. Jonny Ruiz are okay for appointment with Moshe Cipro 05/18/23, lab appointment scheduled for 05/14/23

## 2023-05-07 NOTE — Telephone Encounter (Signed)
Spoke with patients spouse

## 2023-05-09 ENCOUNTER — Emergency Department (HOSPITAL_COMMUNITY)

## 2023-05-09 ENCOUNTER — Encounter (HOSPITAL_BASED_OUTPATIENT_CLINIC_OR_DEPARTMENT_OTHER): Payer: Self-pay | Admitting: Emergency Medicine

## 2023-05-09 ENCOUNTER — Emergency Department (HOSPITAL_BASED_OUTPATIENT_CLINIC_OR_DEPARTMENT_OTHER)

## 2023-05-09 ENCOUNTER — Other Ambulatory Visit: Payer: Self-pay

## 2023-05-09 ENCOUNTER — Inpatient Hospital Stay (HOSPITAL_BASED_OUTPATIENT_CLINIC_OR_DEPARTMENT_OTHER)
Admission: EM | Admit: 2023-05-09 | Discharge: 2023-05-15 | DRG: 035 | Disposition: A | Discharge status: Home Health | Attending: Internal Medicine | Admitting: Internal Medicine

## 2023-05-09 DIAGNOSIS — R9089 Other abnormal findings on diagnostic imaging of central nervous system: Secondary | ICD-10-CM | POA: Diagnosis not present

## 2023-05-09 DIAGNOSIS — R202 Paresthesia of skin: Secondary | ICD-10-CM | POA: Diagnosis not present

## 2023-05-09 DIAGNOSIS — D519 Vitamin B12 deficiency anemia, unspecified: Secondary | ICD-10-CM | POA: Diagnosis present

## 2023-05-09 DIAGNOSIS — Z7982 Long term (current) use of aspirin: Secondary | ICD-10-CM

## 2023-05-09 DIAGNOSIS — I639 Cerebral infarction, unspecified: Secondary | ICD-10-CM | POA: Diagnosis present

## 2023-05-09 DIAGNOSIS — D649 Anemia, unspecified: Secondary | ICD-10-CM | POA: Diagnosis present

## 2023-05-09 DIAGNOSIS — I672 Cerebral atherosclerosis: Secondary | ICD-10-CM | POA: Diagnosis not present

## 2023-05-09 DIAGNOSIS — I739 Peripheral vascular disease, unspecified: Secondary | ICD-10-CM | POA: Diagnosis present

## 2023-05-09 DIAGNOSIS — E785 Hyperlipidemia, unspecified: Secondary | ICD-10-CM | POA: Diagnosis present

## 2023-05-09 DIAGNOSIS — M109 Gout, unspecified: Secondary | ICD-10-CM | POA: Diagnosis present

## 2023-05-09 DIAGNOSIS — I6521 Occlusion and stenosis of right carotid artery: Secondary | ICD-10-CM | POA: Diagnosis present

## 2023-05-09 DIAGNOSIS — R29818 Other symptoms and signs involving the nervous system: Secondary | ICD-10-CM | POA: Diagnosis not present

## 2023-05-09 DIAGNOSIS — R404 Transient alteration of awareness: Secondary | ICD-10-CM | POA: Diagnosis not present

## 2023-05-09 DIAGNOSIS — R29702 NIHSS score 2: Secondary | ICD-10-CM | POA: Diagnosis present

## 2023-05-09 DIAGNOSIS — R29704 NIHSS score 4: Secondary | ICD-10-CM | POA: Diagnosis not present

## 2023-05-09 DIAGNOSIS — I10 Essential (primary) hypertension: Secondary | ICD-10-CM | POA: Diagnosis present

## 2023-05-09 DIAGNOSIS — Z8546 Personal history of malignant neoplasm of prostate: Secondary | ICD-10-CM

## 2023-05-09 DIAGNOSIS — G8194 Hemiplegia, unspecified affecting left nondominant side: Secondary | ICD-10-CM | POA: Diagnosis present

## 2023-05-09 DIAGNOSIS — M4803 Spinal stenosis, cervicothoracic region: Secondary | ICD-10-CM | POA: Diagnosis not present

## 2023-05-09 DIAGNOSIS — F17210 Nicotine dependence, cigarettes, uncomplicated: Secondary | ICD-10-CM | POA: Diagnosis present

## 2023-05-09 DIAGNOSIS — I63511 Cerebral infarction due to unspecified occlusion or stenosis of right middle cerebral artery: Principal | ICD-10-CM | POA: Diagnosis present

## 2023-05-09 DIAGNOSIS — R200 Anesthesia of skin: Secondary | ICD-10-CM | POA: Diagnosis not present

## 2023-05-09 DIAGNOSIS — G8929 Other chronic pain: Secondary | ICD-10-CM | POA: Diagnosis present

## 2023-05-09 DIAGNOSIS — Z79899 Other long term (current) drug therapy: Secondary | ICD-10-CM

## 2023-05-09 DIAGNOSIS — Z7902 Long term (current) use of antithrombotics/antiplatelets: Secondary | ICD-10-CM

## 2023-05-09 DIAGNOSIS — Z8551 Personal history of malignant neoplasm of bladder: Secondary | ICD-10-CM

## 2023-05-09 DIAGNOSIS — M4802 Spinal stenosis, cervical region: Secondary | ICD-10-CM | POA: Diagnosis not present

## 2023-05-09 DIAGNOSIS — Z8739 Personal history of other diseases of the musculoskeletal system and connective tissue: Secondary | ICD-10-CM

## 2023-05-09 DIAGNOSIS — Z8673 Personal history of transient ischemic attack (TIA), and cerebral infarction without residual deficits: Secondary | ICD-10-CM | POA: Diagnosis not present

## 2023-05-09 DIAGNOSIS — G621 Alcoholic polyneuropathy: Secondary | ICD-10-CM | POA: Diagnosis present

## 2023-05-09 DIAGNOSIS — Z803 Family history of malignant neoplasm of breast: Secondary | ICD-10-CM

## 2023-05-09 DIAGNOSIS — E538 Deficiency of other specified B group vitamins: Secondary | ICD-10-CM | POA: Diagnosis present

## 2023-05-09 DIAGNOSIS — I6782 Cerebral ischemia: Secondary | ICD-10-CM | POA: Diagnosis not present

## 2023-05-09 DIAGNOSIS — Z85118 Personal history of other malignant neoplasm of bronchus and lung: Secondary | ICD-10-CM

## 2023-05-09 DIAGNOSIS — Z006 Encounter for examination for normal comparison and control in clinical research program: Secondary | ICD-10-CM

## 2023-05-09 DIAGNOSIS — K5900 Constipation, unspecified: Secondary | ICD-10-CM | POA: Diagnosis present

## 2023-05-09 DIAGNOSIS — I6623 Occlusion and stenosis of bilateral posterior cerebral arteries: Secondary | ICD-10-CM | POA: Diagnosis not present

## 2023-05-09 HISTORY — DX: Cerebral infarction, unspecified: I63.9

## 2023-05-09 LAB — CBC WITH DIFFERENTIAL/PLATELET
Abs Immature Granulocytes: 0.03 10*3/uL (ref 0.00–0.07)
Basophils Absolute: 0 10*3/uL (ref 0.0–0.1)
Basophils Relative: 0 %
Eosinophils Absolute: 0 10*3/uL (ref 0.0–0.5)
Eosinophils Relative: 1 %
HCT: 34.8 % — ABNORMAL LOW (ref 39.0–52.0)
Hemoglobin: 11.3 g/dL — ABNORMAL LOW (ref 13.0–17.0)
Immature Granulocytes: 0 %
Lymphocytes Relative: 13 %
Lymphs Abs: 1 10*3/uL (ref 0.7–4.0)
MCH: 29 pg (ref 26.0–34.0)
MCHC: 32.5 g/dL (ref 30.0–36.0)
MCV: 89.2 fL (ref 80.0–100.0)
Monocytes Absolute: 0.6 10*3/uL (ref 0.1–1.0)
Monocytes Relative: 8 %
Neutro Abs: 5.7 10*3/uL (ref 1.7–7.7)
Neutrophils Relative %: 78 %
Platelets: 239 10*3/uL (ref 150–400)
RBC: 3.9 MIL/uL — ABNORMAL LOW (ref 4.22–5.81)
RDW: 14.5 % (ref 11.5–15.5)
WBC: 7.3 10*3/uL (ref 4.0–10.5)
nRBC: 0 % (ref 0.0–0.2)

## 2023-05-09 LAB — COMPREHENSIVE METABOLIC PANEL
ALT: 9 U/L (ref 0–44)
AST: 19 U/L (ref 15–41)
Albumin: 3.9 g/dL (ref 3.5–5.0)
Alkaline Phosphatase: 80 U/L (ref 38–126)
Anion gap: 10 (ref 5–15)
BUN: 15 mg/dL (ref 8–23)
CO2: 25 mmol/L (ref 22–32)
Calcium: 9.3 mg/dL (ref 8.9–10.3)
Chloride: 101 mmol/L (ref 98–111)
Creatinine, Ser: 0.88 mg/dL (ref 0.61–1.24)
GFR, Estimated: >60 mL/min (ref >60)
Glucose, Bld: 106 mg/dL — ABNORMAL HIGH (ref 70–99)
Potassium: 4.2 mmol/L (ref 3.5–5.1)
Sodium: 136 mmol/L (ref 135–145)
Total Bilirubin: 0.7 mg/dL (ref 0.0–1.2)
Total Protein: 8.2 g/dL — ABNORMAL HIGH (ref 6.5–8.1)

## 2023-05-09 LAB — LIPASE, BLOOD: Lipase: 31 U/L (ref 11–51)

## 2023-05-09 LAB — TROPONIN I (HIGH SENSITIVITY): Troponin I (High Sensitivity): 7 ng/L (ref <18)

## 2023-05-09 MED ORDER — GADOBUTROL 1 MMOL/ML IV SOLN
7.0000 mL | Freq: Once | INTRAVENOUS | Status: AC | PRN
  Administered 2023-05-09: 7 mL via INTRAVENOUS

## 2023-05-09 NOTE — ED Notes (Signed)
 PT ARRIVED FROM THE [facility] FOR AN MRI

## 2023-05-09 NOTE — Discharge Instructions (Addendum)
 You were seen today for left-sided weakness. We have discussed the importance of you proceeding directly to the [facility] emergency room to have an MRI completed of your brain and cervical spine.  These MRIs are already ordered.    [facility]  Discharge Instructions   Carotid Surgery  Please refer to the following instructions for your post-procedure care. Your surgeon or physician assistant will discuss any changes with you.  Activity  You are encouraged to walk as much as you can. You can slowly return to normal activities but must avoid strenuous activity and heavy lifting until your doctor tell you it's okay. Avoid activities such as vacuuming or swinging a golf club. You can drive after one week if you are comfortable and you are no longer taking prescription pain medications. It is normal to feel tired for serval weeks after your surgery. It is also normal to have difficulty with sleep habits, eating, and bowel movements after surgery. These will go away with time.  Bathing/Showering  Shower daily after you go home. Do not soak in a bathtub, hot tub, or swim until the incision heals completely.  Incision Care  Shower every day. Clean your incision with mild soap and water. Pat the area dry with a clean towel. You do not need a bandage unless otherwise instructed. Do not apply any ointments or creams to your incision. You may have skin glue on your incision. Do not peel it off. It will come off on its own in about one week. Your incision may feel thickened and raised for several weeks after your surgery. This is normal and the skin will soften over time.   For Men Only: It's okay to shave around the incision but do not shave the incision itself for 2 weeks. It is common to have numbness under your chin that could last for several months.  Diet  Resume your normal diet. There are no special food restrictions following this procedure. A low  fat/low cholesterol diet is recommended for all patients with vascular disease. In order to heal from your surgery, it is CRITICAL to get adequate nutrition. Your body requires vitamins, minerals, and protein. Vegetables are the best source of vitamins and minerals. Vegetables also provide the perfect balance of protein. Processed food has little nutritional value, so try to avoid this.  Medications  Resume taking all of your medications unless your doctor or physician assistant tells you not to. If your incision is causing pain, you may take over-the- counter pain relievers such as acetaminophen (Tylenol). If you were prescribed a stronger pain medication, please be aware these medications can cause nausea and constipation. Prevent nausea by taking the medication with a snack or meal. Avoid constipation by drinking plenty of fluids and eating foods with a high amount of fiber, such as fruits, vegetables, and grains.  Do not take Tylenol if you are taking prescription pain medications.  Follow Up  Our office will schedule a follow up appointment 2-3 weeks following discharge.  Please call us immediately for any of the following conditions  Increased pain, redness, drainage (pus) from your incision site. Fever of 101 degrees or higher. If you should develop stroke (slurred speech, difficulty swallowing, weakness on one side of your body, loss of vision) you should call 911 and go to the nearest emergency room.  Reduce your risk of vascular disease:  Stop smoking. If you would like help call [facility] at 1-800-QUIT-NOW ((630) 730-1738) or [facility]  at 5486002304. Manage your cholesterol Maintain a desired weight Control your diabetes Keep your blood pressure down  If you have any questions, please call the office at [phone].

## 2023-05-09 NOTE — ED Provider Notes (Signed)
 [facility] Provider Note   CSN: [id number] Arrival date & time: 05/09/23  1638     History Chief Complaint  Patient presents with   Numbness    HPI Stephen Meza is a 89 y.o. male presenting for chief complaint of numbness.  He is an 89 year old male with a complex medical history. He comes in today with inability to use his left arm for the last 3 to 5 days inability to walk normally for the last week and worsening numbness of his left arm over the last 2 weeks. All symptoms have been progressive.  Finally coming in today because of severity of motor symptoms. Denies fevers chills nausea vomiting syncope shortness of breath otherwise.  Has acute on chronic neck pain as well..   Patient's recorded medical, surgical, social, medication list and allergies were reviewed in the Snapshot window as part of the initial history.   Review of Systems   Review of Systems  Constitutional:  Negative for chills and fever.  HENT:  Negative for ear pain and sore throat.   Eyes:  Negative for pain and visual disturbance.  Respiratory:  Negative for cough and shortness of breath.   Cardiovascular:  Negative for chest pain and palpitations.  Gastrointestinal:  Negative for abdominal pain and vomiting.  Genitourinary:  Negative for dysuria and hematuria.  Musculoskeletal:  Positive for neck pain. Negative for arthralgias and back pain.  Skin:  Negative for color change and rash.  Neurological:  Positive for weakness and numbness. Negative for seizures and syncope.  All other systems reviewed and are negative.   Physical Exam Updated Vital Signs BP (!) 152/80   Pulse 94   Temp 99.6 F (37.6 C) (Oral)   Resp 17   Ht 6' (1.829 m)   Wt 74.9 kg   SpO2 93%   BMI 22.39 kg/m  Physical Exam Vitals and nursing note reviewed.  Constitutional:      General: He is not in acute distress.    Appearance: He is well-developed.  HENT:     Head:  Normocephalic and atraumatic.  Eyes:     Conjunctiva/sclera: Conjunctivae normal.  Cardiovascular:     Rate and Rhythm: Normal rate and regular rhythm.     Heart sounds: No murmur heard. Pulmonary:     Effort: Pulmonary effort is normal. No respiratory distress.     Breath sounds: Normal breath sounds.  Abdominal:     Palpations: Abdomen is soft.     Tenderness: There is no abdominal tenderness.  Musculoskeletal:        General: No swelling.     Cervical back: Neck supple.  Skin:    General: Skin is warm and dry.     Capillary Refill: Capillary refill takes less than 2 seconds.  Neurological:     Mental Status: He is alert.     Comments: 2 out of 5 grip strength left hand bilateral lower extremity numbness with decreased sensation to the level of the knee which patient states is baseline.  Very poor sensory function across the entirety of the posterior left arm  Psychiatric:        Mood and Affect: Mood normal.      ED Course/ Medical Decision Making/ A&P    Procedures Procedures   Medications Ordered in ED Medications - No data to display  Medical Decision Making:   89 year old male presenting with relatively severe neurologic symptoms. His left hand is almost completely nonfunctional  due to the motor weakness. Unfortunately he is outside of any code stroke activation/treatment window. Immediately evaluated for metabolic or intracranial hemorrhage etiology of symptoms with CT head and screening lab work as ordered.  Reassessment: His CT head has no focal pathology and blood work is otherwise without focal etiology of his symptoms. I consulted neurology based on the degree of symptoms and findings.  Differential currently includes intracranial mass, cervical mass, TIA/CVA. Neurology Dr. Iver Nestle has recommended MRI of both cervical and brain for evaluation of potential malignancy or CVA. She recommended this being done in the emergency department.  I consulted the team at  the tertiary care center and they were in agreement with transfer to [facility] for continuation of the ED evaluation. Notably, patient does have a procedure in the morning and if his MRIs are negative for acute symptoms he is very passionate about discharge to have this procedure completed tomorrow morning..   Clinical Impression:  1. Paresthesia   2. Numbness      Transfer via POV   Final Clinical Impression(s) / ED Diagnoses Final diagnoses:  Paresthesia  Numbness    Rx / DC Orders ED Discharge Orders     None         Glyn Ade, MD 05/09/23 [id number]

## 2023-05-09 NOTE — ED Notes (Signed)
 Instruction given to go to [facility] to MRI- Face sheet given - leaving via POV.

## 2023-05-09 NOTE — ED Provider Notes (Signed)
 Care of patient assumed from Dr. Anitra Lauth.  This patient presents for 1 month intermittent weakness L arm. Hx of lung cancer. Persistent weakness x 1 week. Awaiting MRI. Reach out to neuro afterwards.  Physical Exam  BP (!) 141/83 (BP Location: Left Arm)   Pulse 93   Temp 99.1 F (37.3 C)   Resp 18   Ht 6' (1.829 m)   Wt 74.9 kg   SpO2 96%   BMI 22.39 kg/m   Physical Exam Vitals and nursing note reviewed.  Constitutional:      General: He is not in acute distress.    Appearance: Normal appearance. He is well-developed. He is not ill-appearing, toxic-appearing or diaphoretic.  HENT:     Head: Normocephalic and atraumatic.     Right Ear: External ear normal.     Left Ear: External ear normal.     Nose: Nose normal.     Mouth/Throat:     Mouth: Mucous membranes are moist.  Eyes:     Extraocular Movements: Extraocular movements intact.     Conjunctiva/sclera: Conjunctivae normal.  Cardiovascular:     Rate and Rhythm: Normal rate and regular rhythm.  Pulmonary:     Effort: Pulmonary effort is normal. No respiratory distress.  Abdominal:     General: There is no distension.     Palpations: Abdomen is soft.  Musculoskeletal:        General: No swelling.     Cervical back: Normal range of motion and neck supple.  Skin:    General: Skin is warm and dry.  Neurological:     Mental Status: He is alert.     Sensory: Sensory deficit present.     Motor: Weakness present.  Psychiatric:        Mood and Affect: Mood normal.        Behavior: Behavior normal.     Procedures  Procedures  ED Course / MDM    Medical Decision Making Amount and/or Complexity of Data Reviewed Labs: ordered. Radiology: ordered.  Risk Prescription drug management. Decision regarding hospitalization.   On assessment, patient sitting in chair with no new complaints.  He has ongoing weakness and numbness to left arm.  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  Case  discussed with neurologist on-call, Dr. Derry Lory, who request admission for stroke workup.  Patient was admitted for further management.       Gloris Manchester, MD 05/10/23 [id number]

## 2023-05-09 NOTE — Plan of Care (Signed)
 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, hypertension, GERD, bladder and prostate cancer, cognitive impairment (08/01/2018 [facility] 18)  He comes in today with gradually progressive left arm symptoms initially beginning as intermittent numbness now with plegia of the left upper extremity per ED provider over the course of 1 month  This is an atypical story for stroke, I would rather be concerned for a progressive compressive/infiltrative process such as metastases in the brain or cervical spine degenerative disc disease with spinal cord/nerve root compression  Additionally, negative head CT, though chronic microvascular disease does reduce sensitivity of the scan  Recommend MRI brain and cervical spine with and without contrast to determine disposition  Brooke Dare MD-PhD [facility] [phone]  8 min of care, majority in direct discussion with Dr. Doran Durand who notified patient of discussion with neurology

## 2023-05-09 NOTE — ED Triage Notes (Signed)
 Patient reports intermittent numbenss in left arm that has been intermittent x 1 month. Numbness became constant 1 week ago. Yesterday, the numbness extended into left face. Wife also report difficulty ambulating and difficulty with coordination x 1 week. MD at bedside to assess.

## 2023-05-10 ENCOUNTER — Inpatient Hospital Stay (HOSPITAL_COMMUNITY)

## 2023-05-10 ENCOUNTER — Encounter (HOSPITAL_COMMUNITY)
Admission: EM | Disposition: A | Discharge status: Home Health | Payer: Self-pay | Source: Home / Self Care | Attending: Internal Medicine

## 2023-05-10 ENCOUNTER — Emergency Department (HOSPITAL_COMMUNITY)

## 2023-05-10 ENCOUNTER — Ambulatory Visit (HOSPITAL_COMMUNITY): Admission: RE | Admit: 2023-05-10 | Source: Home / Self Care | Admitting: Emergency Medicine

## 2023-05-10 ENCOUNTER — Encounter (HOSPITAL_COMMUNITY): Payer: Self-pay | Admitting: Internal Medicine

## 2023-05-10 DIAGNOSIS — I639 Cerebral infarction, unspecified: Secondary | ICD-10-CM | POA: Diagnosis present

## 2023-05-10 DIAGNOSIS — G8194 Hemiplegia, unspecified affecting left nondominant side: Secondary | ICD-10-CM | POA: Diagnosis present

## 2023-05-10 DIAGNOSIS — Z7902 Long term (current) use of antithrombotics/antiplatelets: Secondary | ICD-10-CM | POA: Diagnosis not present

## 2023-05-10 DIAGNOSIS — I771 Stricture of artery: Secondary | ICD-10-CM | POA: Diagnosis not present

## 2023-05-10 DIAGNOSIS — G621 Alcoholic polyneuropathy: Secondary | ICD-10-CM | POA: Diagnosis present

## 2023-05-10 DIAGNOSIS — M109 Gout, unspecified: Secondary | ICD-10-CM | POA: Diagnosis present

## 2023-05-10 DIAGNOSIS — F17210 Nicotine dependence, cigarettes, uncomplicated: Secondary | ICD-10-CM | POA: Diagnosis present

## 2023-05-10 DIAGNOSIS — I6521 Occlusion and stenosis of right carotid artery: Secondary | ICD-10-CM | POA: Diagnosis not present

## 2023-05-10 DIAGNOSIS — M4803 Spinal stenosis, cervicothoracic region: Secondary | ICD-10-CM | POA: Diagnosis not present

## 2023-05-10 DIAGNOSIS — Z48813 Encounter for surgical aftercare following surgery on the respiratory system: Secondary | ICD-10-CM | POA: Diagnosis not present

## 2023-05-10 DIAGNOSIS — R29703 NIHSS score 3: Secondary | ICD-10-CM

## 2023-05-10 DIAGNOSIS — Z8739 Personal history of other diseases of the musculoskeletal system and connective tissue: Secondary | ICD-10-CM

## 2023-05-10 DIAGNOSIS — I6389 Other cerebral infarction: Secondary | ICD-10-CM | POA: Diagnosis not present

## 2023-05-10 DIAGNOSIS — E785 Hyperlipidemia, unspecified: Secondary | ICD-10-CM | POA: Diagnosis present

## 2023-05-10 DIAGNOSIS — Z8546 Personal history of malignant neoplasm of prostate: Secondary | ICD-10-CM | POA: Diagnosis not present

## 2023-05-10 DIAGNOSIS — Z8551 Personal history of malignant neoplasm of bladder: Secondary | ICD-10-CM | POA: Diagnosis not present

## 2023-05-10 DIAGNOSIS — I63511 Cerebral infarction due to unspecified occlusion or stenosis of right middle cerebral artery: Secondary | ICD-10-CM

## 2023-05-10 DIAGNOSIS — I10 Essential (primary) hypertension: Secondary | ICD-10-CM

## 2023-05-10 DIAGNOSIS — D649 Anemia, unspecified: Secondary | ICD-10-CM | POA: Diagnosis not present

## 2023-05-10 DIAGNOSIS — Z006 Encounter for examination for normal comparison and control in clinical research program: Secondary | ICD-10-CM | POA: Diagnosis not present

## 2023-05-10 DIAGNOSIS — K5900 Constipation, unspecified: Secondary | ICD-10-CM | POA: Diagnosis present

## 2023-05-10 DIAGNOSIS — R29818 Other symptoms and signs involving the nervous system: Secondary | ICD-10-CM | POA: Diagnosis not present

## 2023-05-10 DIAGNOSIS — R29702 NIHSS score 2: Secondary | ICD-10-CM | POA: Diagnosis present

## 2023-05-10 DIAGNOSIS — R29704 NIHSS score 4: Secondary | ICD-10-CM | POA: Diagnosis not present

## 2023-05-10 DIAGNOSIS — Z79899 Other long term (current) drug therapy: Secondary | ICD-10-CM | POA: Diagnosis not present

## 2023-05-10 DIAGNOSIS — Z803 Family history of malignant neoplasm of breast: Secondary | ICD-10-CM | POA: Diagnosis not present

## 2023-05-10 DIAGNOSIS — I6623 Occlusion and stenosis of bilateral posterior cerebral arteries: Secondary | ICD-10-CM | POA: Diagnosis not present

## 2023-05-10 DIAGNOSIS — I739 Peripheral vascular disease, unspecified: Secondary | ICD-10-CM | POA: Diagnosis not present

## 2023-05-10 DIAGNOSIS — I672 Cerebral atherosclerosis: Secondary | ICD-10-CM | POA: Diagnosis not present

## 2023-05-10 DIAGNOSIS — R9089 Other abnormal findings on diagnostic imaging of central nervous system: Secondary | ICD-10-CM | POA: Diagnosis not present

## 2023-05-10 DIAGNOSIS — D519 Vitamin B12 deficiency anemia, unspecified: Secondary | ICD-10-CM | POA: Diagnosis present

## 2023-05-10 DIAGNOSIS — Z8673 Personal history of transient ischemic attack (TIA), and cerebral infarction without residual deficits: Secondary | ICD-10-CM | POA: Diagnosis not present

## 2023-05-10 DIAGNOSIS — Z87891 Personal history of nicotine dependence: Secondary | ICD-10-CM | POA: Diagnosis not present

## 2023-05-10 DIAGNOSIS — M4802 Spinal stenosis, cervical region: Secondary | ICD-10-CM | POA: Diagnosis not present

## 2023-05-10 DIAGNOSIS — G8929 Other chronic pain: Secondary | ICD-10-CM | POA: Diagnosis present

## 2023-05-10 DIAGNOSIS — Z7982 Long term (current) use of aspirin: Secondary | ICD-10-CM | POA: Diagnosis not present

## 2023-05-10 DIAGNOSIS — Z85118 Personal history of other malignant neoplasm of bronchus and lung: Secondary | ICD-10-CM | POA: Diagnosis not present

## 2023-05-10 DIAGNOSIS — R200 Anesthesia of skin: Secondary | ICD-10-CM | POA: Diagnosis present

## 2023-05-10 LAB — COMPREHENSIVE METABOLIC PANEL
ALT: 11 U/L (ref 0–44)
AST: 24 U/L (ref 15–41)
Albumin: 3.8 g/dL (ref 3.5–5.0)
Alkaline Phosphatase: 78 U/L (ref 38–126)
Anion gap: 11 (ref 5–15)
BUN: 12 mg/dL (ref 8–23)
CO2: 23 mmol/L (ref 22–32)
Calcium: 9.2 mg/dL (ref 8.9–10.3)
Chloride: 99 mmol/L (ref 98–111)
Creatinine, Ser: 0.8 mg/dL (ref 0.61–1.24)
GFR, Estimated: >60 mL/min (ref >60)
Glucose, Bld: 114 mg/dL — ABNORMAL HIGH (ref 70–99)
Potassium: 3.8 mmol/L (ref 3.5–5.1)
Sodium: 133 mmol/L — ABNORMAL LOW (ref 135–145)
Total Bilirubin: 0.7 mg/dL (ref 0.0–1.2)
Total Protein: 8.2 g/dL — ABNORMAL HIGH (ref 6.5–8.1)

## 2023-05-10 LAB — IRON AND TIBC
Iron: 66 ug/dL (ref 45–182)
Saturation Ratios: 23 % (ref 17.9–39.5)
TIBC: 287 ug/dL (ref 250–450)
UIBC: 221 ug/dL

## 2023-05-10 LAB — CBC
HCT: 37.4 % — ABNORMAL LOW (ref 39.0–52.0)
Hemoglobin: 11.6 g/dL — ABNORMAL LOW (ref 13.0–17.0)
MCH: 28.2 pg (ref 26.0–34.0)
MCHC: 31 g/dL (ref 30.0–36.0)
MCV: 91 fL (ref 80.0–100.0)
Platelets: 233 10*3/uL (ref 150–400)
RBC: 4.11 MIL/uL — ABNORMAL LOW (ref 4.22–5.81)
RDW: 14.5 % (ref 11.5–15.5)
WBC: 6.7 10*3/uL (ref 4.0–10.5)
nRBC: 0 % (ref 0.0–0.2)

## 2023-05-10 LAB — RETICULOCYTES
Immature Retic Fract: 8.6 % (ref 2.3–15.9)
RBC.: 4.09 MIL/uL — ABNORMAL LOW (ref 4.22–5.81)
Retic Count, Absolute: 38.4 10*3/uL (ref 19.0–186.0)
Retic Ct Pct: 0.9 % (ref 0.4–3.1)

## 2023-05-10 LAB — VITAMIN B12: Vitamin B-12: 769 pg/mL (ref 180–914)

## 2023-05-10 LAB — LIPID PANEL
Cholesterol: 162 mg/dL (ref 0–200)
HDL: 53 mg/dL (ref >40)
LDL Cholesterol: 95 mg/dL (ref 0–99)
Total CHOL/HDL Ratio: 3.1 ratio
Triglycerides: 69 mg/dL (ref <150)
VLDL: 14 mg/dL (ref 0–40)

## 2023-05-10 LAB — HEMOGLOBIN A1C
Hgb A1c MFr Bld: 5.5 % (ref 4.8–5.6)
Mean Plasma Glucose: 111.15 mg/dL

## 2023-05-10 LAB — FERRITIN: Ferritin: 48 ng/mL (ref 24–336)

## 2023-05-10 LAB — FOLATE: Folate: 14.8 ng/mL (ref >5.9)

## 2023-05-10 SURGERY — BRONCHOSCOPY, WITH BIOPSY USING ELECTROMAGNETIC NAVIGATION
Anesthesia: General | Laterality: Right

## 2023-05-10 MED ORDER — PANTOPRAZOLE SODIUM 40 MG PO TBEC
40.0000 mg | DELAYED_RELEASE_TABLET | Freq: Every day | ORAL | Status: DC
  Administered 2023-05-10 – 2023-05-15 (×5): 40 mg via ORAL
  Filled 2023-05-10 (×5): qty 1

## 2023-05-10 MED ORDER — ASPIRIN 300 MG RE SUPP
300.0000 mg | Freq: Every day | RECTAL | Status: DC
  Filled 2023-05-10: qty 1

## 2023-05-10 MED ORDER — STROKE: EARLY STAGES OF RECOVERY BOOK
Freq: Once | Status: AC
  Filled 2023-05-10: qty 1

## 2023-05-10 MED ORDER — ACETAMINOPHEN 650 MG RE SUPP: 650.0000 mg | RECTAL | Status: DC | PRN

## 2023-05-10 MED ORDER — ENOXAPARIN SODIUM 40 MG/0.4ML IJ SOSY
40.0000 mg | PREFILLED_SYRINGE | INTRAMUSCULAR | Status: DC
  Administered 2023-05-10 – 2023-05-15 (×5): 40 mg via SUBCUTANEOUS
  Filled 2023-05-10 (×5): qty 0.4

## 2023-05-10 MED ORDER — CLOPIDOGREL BISULFATE 75 MG PO TABS
75.0000 mg | ORAL_TABLET | Freq: Every day | ORAL | Status: DC
  Administered 2023-05-11 – 2023-05-15 (×4): 75 mg via ORAL
  Filled 2023-05-10 (×4): qty 1

## 2023-05-10 MED ORDER — ALLOPURINOL 100 MG PO TABS
100.0000 mg | ORAL_TABLET | Freq: Every day | ORAL | Status: DC
  Administered 2023-05-10 – 2023-05-15 (×5): 100 mg via ORAL
  Filled 2023-05-10 (×5): qty 1

## 2023-05-10 MED ORDER — SODIUM CHLORIDE 0.9 % IV SOLN: INTRAVENOUS | Status: DC

## 2023-05-10 MED ORDER — DOCUSATE SODIUM 100 MG PO CAPS
100.0000 mg | ORAL_CAPSULE | Freq: Once | ORAL | Status: AC
Start: 2023-05-10 — End: 2023-05-10
  Administered 2023-05-10: 100 mg via ORAL
  Filled 2023-05-10: qty 1

## 2023-05-10 MED ORDER — IOHEXOL 350 MG/ML SOLN
75.0000 mL | Freq: Once | INTRAVENOUS | Status: AC | PRN
  Administered 2023-05-10: 75 mL via INTRAVENOUS

## 2023-05-10 MED ORDER — VITAMIN B-12 1000 MCG PO TABS
3000.0000 ug | ORAL_TABLET | Freq: Every day | ORAL | Status: DC
  Administered 2023-05-10 – 2023-05-15 (×5): 3000 ug via ORAL
  Filled 2023-05-10 (×5): qty 3

## 2023-05-10 MED ORDER — ATORVASTATIN CALCIUM 40 MG PO TABS
40.0000 mg | ORAL_TABLET | Freq: Every day | ORAL | Status: DC
  Administered 2023-05-10 – 2023-05-15 (×5): 40 mg via ORAL
  Filled 2023-05-10 (×5): qty 1

## 2023-05-10 MED ORDER — ACETAMINOPHEN 160 MG/5ML PO SOLN: 650.0000 mg | ORAL | Status: DC | PRN

## 2023-05-10 MED ORDER — ASPIRIN 325 MG PO TABS
325.0000 mg | ORAL_TABLET | Freq: Every day | ORAL | Status: DC
  Administered 2023-05-10 – 2023-05-15 (×5): 325 mg via ORAL
  Filled 2023-05-10 (×5): qty 1

## 2023-05-10 MED ORDER — ACETAMINOPHEN 325 MG PO TABS: 650.0000 mg | ORAL_TABLET | ORAL | Status: DC | PRN

## 2023-05-10 MED ORDER — CLOPIDOGREL BISULFATE 300 MG PO TABS
300.0000 mg | ORAL_TABLET | Freq: Once | ORAL | Status: AC
  Administered 2023-05-10: 300 mg via ORAL
  Filled 2023-05-10: qty 1

## 2023-05-10 NOTE — Progress Notes (Addendum)
 STROKE TEAM PROGRESS NOTE   INTERIM HISTORY/SUBJECTIVE Family at the bedside.  Patient sitting up in the bed in no apparent distress Wife states that patient has had on and off left side weakness, however the weakness got worse over the past week.  He also complained of numbness on his left face and he was unable to use his left hand over the weekend  MRI brain with multifocal acute infarcts in right MCA and PCA CTA head and neck with right ICA severe stenosis which vascular surgery has been consulted for  OBJECTIVE  CBC    Component Value Date/Time   WBC 6.7 05/10/2023 0939   RBC 4.11 (L) 05/10/2023 0939   RBC 4.09 (L) 05/10/2023 0939   HGB 11.6 (L) 05/10/2023 0939   HCT 37.4 (L) 05/10/2023 0939   PLT 233 05/10/2023 0939   MCV 91.0 05/10/2023 0939   MCH 28.2 05/10/2023 0939   MCHC 31.0 05/10/2023 0939   RDW 14.5 05/10/2023 0939   LYMPHSABS 1.0 05/09/2023 1654   MONOABS 0.6 05/09/2023 1654   EOSABS 0.0 05/09/2023 1654   BASOSABS 0.0 05/09/2023 1654    BMET    Component Value Date/Time   NA 133 (L) 05/10/2023 0939   K 3.8 05/10/2023 0939   CL 99 05/10/2023 0939   CO2 23 05/10/2023 0939   GLUCOSE 114 (H) 05/10/2023 0939   BUN 12 05/10/2023 0939   CREATININE 0.80 05/10/2023 0939   CALCIUM 9.2 05/10/2023 0939   GFRNONAA >60 05/10/2023 0939    IMAGING past 24 hours CT ANGIO HEAD NECK W WO CM Result Date: 05/10/2023 CLINICAL DATA:  Stroke workup EXAM: CT ANGIOGRAPHY HEAD AND NECK WITH AND WITHOUT CONTRAST TECHNIQUE: Multidetector CT imaging of the head and neck was performed using the standard protocol during bolus administration of intravenous contrast. Multiplanar CT image reconstructions and MIPs were obtained to evaluate the vascular anatomy. Carotid stenosis measurements (when applicable) are obtained utilizing NASCET criteria, using the distal internal carotid diameter as the denominator. RADIATION DOSE REDUCTION: This exam was performed according to the departmental  dose-optimization program which includes automated exposure control, adjustment of the mA and/or kV according to patient size and/or use of iterative reconstruction technique. CONTRAST:  75mL OMNIPAQUE IOHEXOL 350 MG/ML SOLN COMPARISON:  Brain MRI from earlier today FINDINGS: CT HEAD FINDINGS Brain: Acute infarcts by prior brain MRI are underestimated. No evidence of progression. No hemorrhage, hydrocephalus, or shift. Mild for age cerebral volume loss Vascular: See below Skull: No acute finding Sinuses/Orbits: No acute finding Review of the MIP images confirms the above findings CTA NECK FINDINGS Aortic arch: Atheromatous plaque which is extensive. Three vessel branching. No dissection or stenosis. Right carotid system: Diffuse atheromatous wall thickening with mixed density plaque at the bifurcation causing a short segment string sign over a 1 cm length, near the angle of the mandible. No ulceration or beading. Left carotid system: Atheromatous wall thickening of the common carotid with mixed density plaque at the bifurcation. No flow reducing stenosis, ulceration, or beading. Vertebral arteries: No proximal subclavian stenosis. The left vertebral artery is strongly dominant and widely patent. Diminutive and relatively faintly enhancing right vertebral artery proximally but patent to the dura without visible beading or dissection. Skeleton: Generalized degenerative spurring without acute finding. Other neck: No acute finding Upper chest: Biapical subpleural fibrotic appearance. Review of the MIP images confirms the above findings CTA HEAD FINDINGS Anterior circulation: No major branch occlusion, beading, or proximal flow reducing stenosis. There is bilateral atheromatous calcification of  the cavernous carotids. On thick MIPS, there is less density in the right MCA distribution diffusely. Negative for aneurysm. Posterior circulation: Left dominant vertebral artery. The diminutive right vertebral artery is patent  with filling PICA. Atheromatous irregularity of the posterior cerebral arteries with moderate left P1 and advanced right P1/P2 stenoses. Venous sinuses: Unremarkable Review of the MIP images confirms the above findings IMPRESSION: 1. Severe atheromatous stenosis at the proximal right ICA with short segment string sign and intracranial relative underfilling. 2. Advanced stenosis in the right more than left proximal PCA. Electronically Signed   By: Tiburcio Pea M.D.   On: 05/10/2023 05:10   MR Brain W and Wo Contrast Result Date: 05/10/2023 CLINICAL DATA:  Acute neurologic deficit EXAM: MRI HEAD WITHOUT AND WITH CONTRAST MRI CERVICAL SPINE WITHOUT AND WITH CONTRAST TECHNIQUE: Multiplanar, multiecho pulse sequences of the brain and surrounding structures, and cervical spine, to include the craniocervical junction and cervicothoracic junction, were obtained without and with intravenous contrast. CONTRAST:  7mL GADAVIST GADOBUTROL 1 MMOL/ML IV SOLN COMPARISON:  None Available. FINDINGS: MRI HEAD FINDINGS Brain: Multifocal abnormal diffusion restriction within the right MCA and PCA territories, including the right precentral gyrus. Fewer than 5 scattered chronic microhemorrhages in a nonspecific pattern. There is multifocal hyperintense T2-weighted signal within the white matter. Parenchymal volume and CSF spaces are normal. Old left cerebellar infarct. The midline structures are normal. There is no abnormal contrast enhancement. Vascular: Attenuated right ICA flow void. Skull and upper cervical spine: Normal calvarium and skull base. Visualized upper cervical spine and soft tissues are normal. Sinuses/Orbits:No paranasal sinus fluid levels or advanced mucosal thickening. No mastoid or middle ear effusion. Normal orbits. MRI CERVICAL SPINE FINDINGS Alignment: Physiologic. Vertebrae: No fracture, evidence of discitis, or bone lesion. Cord: Normal signal and morphology. Posterior Fossa, vertebral arteries, paraspinal  tissues: Abnormal proximal right vertebral artery V2 segment flow void. Disc levels: C1-2: Unremarkable. C2-3: Normal disc. Mild facet hypertrophy. There is no spinal canal stenosis. No neural foraminal stenosis. C3-4: Right uncovertebral hypertrophy. There is no spinal canal stenosis. Moderate right neural foraminal stenosis. C4-5: Right foraminal disc osteophyte complex. Mild spinal canal stenosis. Severe right neural foraminal stenosis. C5-6: Bilateral uncovertebral hypertrophy. There is no spinal canal stenosis. Moderate right and severe left neural foraminal stenosis. C6-7: Left uncovertebral spurring. There is no spinal canal stenosis. Severe left neural foraminal stenosis. C7-T1: Disc space narrowing without herniation. There is no spinal canal stenosis. No neural foraminal stenosis. IMPRESSION: 1. Multifocal acute infarcts within the right MCA and PCA territories, including the right precentral gyrus. 2. Attenuated right ICA flow void at the skull base, likely stenosis. 3. Abnormal right vertebral artery V2 segment flow void, consistent with slow flow or occlusion. The distal right vertebral artery flow void is maintained. 4. Mild spinal canal stenosis and severe right neural foraminal stenosis at C4-5. 5. Moderate right and severe left C5-6 and C6-7 neural foraminal stenosis. Electronically Signed   By: Deatra Robinson M.D.   On: 05/10/2023 00:47   MR Cervical Spine W and Wo Contrast Result Date: 05/10/2023 CLINICAL DATA:  Acute neurologic deficit EXAM: MRI HEAD WITHOUT AND WITH CONTRAST MRI CERVICAL SPINE WITHOUT AND WITH CONTRAST TECHNIQUE: Multiplanar, multiecho pulse sequences of the brain and surrounding structures, and cervical spine, to include the craniocervical junction and cervicothoracic junction, were obtained without and with intravenous contrast. CONTRAST:  7mL GADAVIST GADOBUTROL 1 MMOL/ML IV SOLN COMPARISON:  None Available. FINDINGS: MRI HEAD FINDINGS Brain: Multifocal abnormal diffusion  restriction within the right  MCA and PCA territories, including the right precentral gyrus. Fewer than 5 scattered chronic microhemorrhages in a nonspecific pattern. There is multifocal hyperintense T2-weighted signal within the white matter. Parenchymal volume and CSF spaces are normal. Old left cerebellar infarct. The midline structures are normal. There is no abnormal contrast enhancement. Vascular: Attenuated right ICA flow void. Skull and upper cervical spine: Normal calvarium and skull base. Visualized upper cervical spine and soft tissues are normal. Sinuses/Orbits:No paranasal sinus fluid levels or advanced mucosal thickening. No mastoid or middle ear effusion. Normal orbits. MRI CERVICAL SPINE FINDINGS Alignment: Physiologic. Vertebrae: No fracture, evidence of discitis, or bone lesion. Cord: Normal signal and morphology. Posterior Fossa, vertebral arteries, paraspinal tissues: Abnormal proximal right vertebral artery V2 segment flow void. Disc levels: C1-2: Unremarkable. C2-3: Normal disc. Mild facet hypertrophy. There is no spinal canal stenosis. No neural foraminal stenosis. C3-4: Right uncovertebral hypertrophy. There is no spinal canal stenosis. Moderate right neural foraminal stenosis. C4-5: Right foraminal disc osteophyte complex. Mild spinal canal stenosis. Severe right neural foraminal stenosis. C5-6: Bilateral uncovertebral hypertrophy. There is no spinal canal stenosis. Moderate right and severe left neural foraminal stenosis. C6-7: Left uncovertebral spurring. There is no spinal canal stenosis. Severe left neural foraminal stenosis. C7-T1: Disc space narrowing without herniation. There is no spinal canal stenosis. No neural foraminal stenosis. IMPRESSION: 1. Multifocal acute infarcts within the right MCA and PCA territories, including the right precentral gyrus. 2. Attenuated right ICA flow void at the skull base, likely stenosis. 3. Abnormal right vertebral artery V2 segment flow void,  consistent with slow flow or occlusion. The distal right vertebral artery flow void is maintained. 4. Mild spinal canal stenosis and severe right neural foraminal stenosis at C4-5. 5. Moderate right and severe left C5-6 and C6-7 neural foraminal stenosis. Electronically Signed   By: Deatra Robinson M.D.   On: 05/10/2023 00:47   CT HEAD WO CONTRAST ( ) Result Date: 05/09/2023 CLINICAL DATA:  Altered level of consciousness, intermittent left arm numbness for 1 month, left facial numbness since yesterday EXAM: CT HEAD WITHOUT CONTRAST TECHNIQUE: Contiguous axial images were obtained from the base of the skull through the vertex without intravenous contrast. RADIATION DOSE REDUCTION: This exam was performed according to the departmental dose-optimization program which includes automated exposure control, adjustment of the mA and/or kV according to patient size and/or use of iterative reconstruction technique. COMPARISON:  09/12/2018 FINDINGS: Brain: There is diffuse cerebral atrophy, likely appropriate for age. Extensive hypodensities are seen throughout the periventricular white matter compatible with chronic small vessel ischemic changes. No evidence of acute infarct or hemorrhage. Lateral ventricles and midline structures are unremarkable. No acute extra-axial fluid collections. No mass effect. Vascular: No hyperdense vessel or unexpected calcification. Skull: Normal. Negative for fracture or focal lesion. Sinuses/Orbits: No acute finding. Other: None. IMPRESSION: 1. Chronic small-vessel ischemic changes and diffuse cerebral atrophy. 2. No acute intracranial process. Electronically Signed   By: Sharlet Salina M.D.   On: 05/09/2023 17:23    Vitals:   05/09/23 2050 05/10/23 0357 05/10/23 1015 05/10/23 1516  BP: (!) 141/83 (!) 138/97 131/66 (!) 158/85  Pulse: 93 92 71 67  Resp: 18 18 16 18   Temp: 99.1 F (37.3 C) 98.4 F (36.9 C) 98.6 F (37 C) 98.1 F (36.7 C)  TempSrc:  Oral Oral Oral  SpO2: 96% 100%  95% 96%  Weight:      Height:         PHYSICAL EXAM General:  Alert, well-nourished, well-developed patient in no  acute distress Psych:  Mood and affect appropriate for situation CV: Regular rate and rhythm on monitor Respiratory:  Regular, unlabored respirations on room air GI: Abdomen soft and nontender   NEURO:  Mental Status: AA&Ox3, patient is able to give clear and coherent history Speech/Language: speech is without dysarthria or aphasia.  Naming, repetition, fluency, and comprehension intact.  Cranial Nerves:  II: PERRL. Visual fields full.  III, IV, VI: EOMI. Eyelids elevate symmetrically.  V: Sensation is intact to light touch and symmetrical to face.  VII: Slight left facial droop VIII: hearing intact to voice. IX, X: Palate elevates symmetrically. Phonation is normal.  [id number] shrug 5/5. XII: tongue is midline without fasciculations. Motor: Right arm and leg 5/5, left arm and leg 4/5 with drift, weak  dorsi flexion on the left foot Tone: is normal and bulk is normal Sensation- Intact to light touch bilaterally. Extinction absent to light touch to DSS.   Coordination: Ataxia of left arm and leg Gait- deferred  Most Recent NIH  1a Level of Conscious.: 0 1b LOC Questions: 0 1c LOC Commands: 0 2 Best Gaze: 0 3 Visual: 0 4 Facial Palsy: 1 5a Motor Arm - left: 1 5b Motor Arm - Right: 0 6a Motor Leg - Left: 1 6b Motor Leg - Right: 0 7 Limb Ataxia: 2 8 Sensory: 0 9 Best Language: 0 10 Dysarthria: 0 11 Extinct. and Inatten.: 0 TOTAL: 5   ASSESSMENT/PLAN  [name] is a 89 y.o. male with history of hypertension, gout, prior history of bladder cancer, lung nodule scheduled for lung biopsy today, B12 deficiency neuropathy presents to the ER because of numbness involving his left face left upper and lower extremity ongoing for last few weeks.  Dems have gotten worse since Saturday.  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 Small vessel disease. Atrophy.  CTA head & neck Severe atheromatous stenosis at the proximal right ICA with short segment string sign and intracranial relative underfilling. MRI right scattered MCA infarcts, more consistent with watershed fusion 2D Echo pending LDL 95 HgbA1c 5.5 VTE prophylaxis -Lovenox No antithrombotic prior to admission, now on aspirin 81 mg daily and clopidogrel 75 mg daily  Therapy recommendations: CIR Disposition: Pending  Right ICA high-grade stenosis CTA Severe atheromatous stenosis at the proximal right ICA with short segment string sign and intracranial relative underfilling Vascular surgery consulted Probable OR on Friday for right TCAR  Hypertension Home meds: Amlodipine 2.5 mg Stable Avoid low BP BP goal 130-160 before carotid intervention Long-term BP goal normotensive  Hyperlipidemia Home meds: None LDL 95, goal < 70 Add atorvastatin 40 mg Continue statin at discharge  Other Stroke Risk Factors Advanced age Former smoker Former alcohol abuse  Other Active Problems Lung nodule pending biopsy Gout 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 Pt was seen and examined.   Wife and daughters are at the bedside. Pt is awake, alert, eyes open, orientated to age, place, time and people. No aphasia, paucity of language bu following all simple commands, slight dysarthria. Able to name and repeat. No gaze palsy, tracking bilaterally, visual field full, PERRL. Slight left facial droop. Tongue midline. RUE and RLE 5/5, LUE and LLE 4+/5 with mild LUE pronator drift.  Sensation symmetrical bilaterally, however, left FTN and HTS ataxia out of proportion to the weakness, gait not tested.   Patient stroke more  consistent with right watershed distribution, likely due to right ICA high-grade stenosis.  Vascular surgery consulted, plan for Friday TCAR procedure.  BP goal 130-160 prior to carotid intervention.  On DAPT and statin.  Avoid low BP.  PT and OT recommend CIR.  Will follow  For detailed assessment and plan, please refer to above/below as I have made changes wherever appropriate.   Marvel Plan, MD PhD Stroke Neurology 05/10/2023 6:42 PM  I discussed with Dr. Karin Lieu. I spent additional 30 inpatient minutes face-to-face time with the patient, more than 50% of which was spent in counseling and coordination of care, reviewing test results, images and medication, and discussing the diagnosis, treatment plan and potential prognosis. This patient's care requiresreview of multiple databases, neurological assessment, discussion with family, other specialists and medical decision making of high complexity. I had long discussion with wife and daughters at bedside, updated pt current condition, treatment plan and potential prognosis, and answered all the questions.  They expressed understanding and appreciation.     To contact Stroke Continuity provider, please refer to [email or web address]. After hours, contact General Neurology

## 2023-05-10 NOTE — H&P (Signed)
 History and Physical    Stephen Meza HYQ:[id number] DOB: Sep 11, 1933 DOA: 05/09/2023  Patient coming from: Home.  Chief Complaint: Left upper extremity and lower extremity numbness and weakness.  HPI: Stephen Meza is a 89 y.o. male with history of hypertension, gout, prior history of bladder cancer, lung nodule scheduled for lung biopsy today, B12 deficiency neuropathy presents to the ER because of numbness involving his left face left upper and lower extremity ongoing for last few weeks.  Patient states he has been having intermittent weakness and numbness of the left side of his body for the last few weeks.  Since Saturday 3 days ago patient's symptoms became more profound and he had some difficulty walking.  Denies any visual symptoms or difficulty speaking.  ED Course: In the ER MRI of the brain shows multifocal acute infarct involving the right MCA and PCA territory.  CT angiogram head and neck shows right ICA stenosis with string sign.  Neurology on-call was consulted patient was placed on Plavix aspirin and admitted for further management of acute stroke.  On exam patient is able to move all extremities without difficulty.  Labs show hemoglobin of 11.3 EKG shows normal sinus rhythm  Review of Systems: As per HPI, rest all negative.   Past Medical History:  Diagnosis Date   DUODENITIS, WITHOUT HEMORRHAGE 07/14/2007   ERECTILE DYSFUNCTION 07/14/2007   Esophageal stricture 03/19/2016   ESOPHAGITIS 07/14/2007   EXTERNAL HEMORRHOIDS 07/14/2007   GERD 07/14/2007   Gout    HYPERTENSION 07/14/2007   PERIPHERAL NEUROPATHY 07/14/2007   Peripheral neuropathy 04/06/2019   PERIPHERAL VASCULAR DISEASE 07/14/2007   PODAGRA 07/14/2007   Prostate cancer (HCC) 02/2012   pt had screening and then biopsy   Retinal tear of right eye 03/19/2016   TOBACCO ABUSE, HX OF 02/12/2010   TONSILLECTOMY, HX OF 07/14/2007   TREMOR, ESSENTIAL 07/14/2007   VITAMIN B12 DEFICIENCY 07/14/2007    Past Surgical  History:  Procedure Laterality Date   Excision of lipoma     PROSTATE BIOPSY Bilateral 03/10/2012   surgery to reduce turbinate and straighten deviated septum  2016   TONSILLECTOMY       reports that he has quit smoking. His smoking use included cigarettes. He started smoking about 29 years ago. He has a 21.9 pack-year smoking history. He has quit using smokeless tobacco. He reports that he does not currently use alcohol. He reports that he does not use drugs.  Allergies  Allergen Reactions   Nitrofuran Derivatives Rash   Ciprofloxacin Hcl Other (See Comments)    numbness    Family History  Problem Relation Age of Onset   Hypothyroidism Daughter    Breast cancer Other    Cancer Mother    Other Father    Breast cancer Sister     Prior to Admission medications   Medication Sig Start Date End Date Taking? Authorizing Provider  allopurinol (ZYLOPRIM) 100 MG tablet TAKE 1 TABLET BY MOUTH EVERY DAY 06/02/22  Yes Nadara Mustard, MD  amLODipine (NORVASC) 2.5 MG tablet Take 1 tablet (2.5 mg total) by mouth daily. 07/03/22  Yes Corwin Levins, MD  Cyanocobalamin (B-12) 3000 MCG CAPS Take 3,000 mcg by mouth daily.   Yes [provider]  docusate sodium (COLACE) 100 MG capsule Take 100 mg by mouth once.   Yes [provider]  omeprazole (PRILOSEC) 40 MG capsule TAKE 1 CAPSULE BY MOUTH EVERY DAY 03/31/23  Yes Corwin Levins, MD  Vitamins-Lipotropics (LIPOFLAVONOID PO)  Take 2 capsules by mouth daily.   Yes [provider]  aspirin EC 81 MG tablet Take 81 mg by mouth daily. Swallow whole. Patient not taking: Reported on 05/09/2023    [provider]    Physical Exam: Constitutional: Moderately built and nourished. Vitals:   05/09/23 1648 05/09/23 2004 05/09/23 2050 05/10/23 0357  BP:  136/76 (!) 141/83 (!) 138/97  Pulse: 94 89 93 92  Resp:  (!) 22 18 18   Temp:  99.1 F (37.3 C) 99.1 F (37.3 C) 98.4 F (36.9 C)  TempSrc:  Oral  Oral  SpO2: 93% 94% 96%  100%  Weight: 74.9 kg     Height: 6' (1.829 m)      Eyes: Anicteric no pallor. ENMT: No discharge from the ears eyes nose or mouth. Neck: No mass felt.  No neck rigidity. Respiratory: No rhonchi or crepitations. Cardiovascular: S1-S2 heard. Abdomen: Soft nontender bowel sound present. Musculoskeletal: No edema. Skin: No rash. Neurologic: Alert awake oriented to time place and person.  Moves all extremities 5 x 5.  No facial asymmetry tongue is midline pupils equal reacting to light. Psychiatric: Appears normal.  Normal affect.   Labs on Admission: I have personally reviewed following labs and imaging studies  CBC: Recent Labs  Lab 05/09/23 1654  WBC 7.3  NEUTROABS 5.7  HGB 11.3*  HCT 34.8*  MCV 89.2  PLT 239   Basic Metabolic Panel: Recent Labs  Lab 05/09/23 1654  NA 136  K 4.2  CL 101  CO2 25  GLUCOSE 106*  BUN 15  CREATININE 0.88  CALCIUM 9.3   GFR: Estimated Creatinine Clearance: 62.7 mL/min (by C-G formula based on SCr of 0.88 mg/dL). Liver Function Tests: Recent Labs  Lab 05/09/23 1654  AST 19  ALT 9  ALKPHOS 80  BILITOT 0.7  PROT 8.2*  ALBUMIN 3.9   Recent Labs  Lab 05/09/23 1654  LIPASE 31   No results for input(s): "AMMONIA" in the last 168 hours. Coagulation Profile: No results for input(s): "INR", "PROTIME" in the last 168 hours. Cardiac Enzymes: No results for input(s): "CKTOTAL", "CKMB", "CKMBINDEX", "TROPONINI" in the last 168 hours. BNP (last 3 results) No results for input(s): "PROBNP" in the last 8760 hours. HbA1C: No results for input(s): "HGBA1C" in the last 72 hours. CBG: No results for input(s): "GLUCAP" in the last 168 hours. Lipid Profile: No results for input(s): "CHOL", "HDL", "LDLCALC", "TRIG", "CHOLHDL", "LDLDIRECT" in the last 72 hours. Thyroid Function Tests: No results for input(s): "TSH", "T4TOTAL", "FREET4", "T3FREE", "THYROIDAB" in the last 72 hours. Anemia Panel: No results for input(s): "VITAMINB12",  "FOLATE", "FERRITIN", "TIBC", "IRON", "RETICCTPCT" in the last 72 hours. Urine analysis:    Component Value Date/Time   COLORURINE YELLOW 05/08/2022 1308   APPEARANCEUR CLEAR 05/08/2022 1308   LABSPEC 1.020 05/08/2022 1308   PHURINE 6.0 05/08/2022 1308   GLUCOSEU NEGATIVE 05/08/2022 1308   HGBUR NEGATIVE 05/08/2022 1308   BILIRUBINUR NEGATIVE 05/08/2022 1308   KETONESUR NEGATIVE 05/08/2022 1308   UROBILINOGEN 0.2 05/08/2022 1308   NITRITE NEGATIVE 05/08/2022 1308   LEUKOCYTESUR NEGATIVE 05/08/2022 1308   Sepsis Labs: @LABRCNTIP (procalcitonin:4,lacticidven:4) )No results found for this or any previous visit (from the past 240 hours).   Radiological Exams on Admission: CT ANGIO HEAD NECK W WO CM Result Date: 05/10/2023 CLINICAL DATA:  Stroke workup EXAM: CT ANGIOGRAPHY HEAD AND NECK WITH AND WITHOUT CONTRAST TECHNIQUE: Multidetector CT imaging of the head and neck was performed using the standard protocol during bolus  administration of intravenous contrast. Multiplanar CT image reconstructions and MIPs were obtained to evaluate the vascular anatomy. Carotid stenosis measurements (when applicable) are obtained utilizing NASCET criteria, using the distal internal carotid diameter as the denominator. RADIATION DOSE REDUCTION: This exam was performed according to the departmental dose-optimization program which includes automated exposure control, adjustment of the mA and/or kV according to patient size and/or use of iterative reconstruction technique. CONTRAST:  75mL OMNIPAQUE IOHEXOL 350 MG/ML SOLN COMPARISON:  Brain MRI from earlier today FINDINGS: CT HEAD FINDINGS Brain: Acute infarcts by prior brain MRI are underestimated. No evidence of progression. No hemorrhage, hydrocephalus, or shift. Mild for age cerebral volume loss Vascular: See below Skull: No acute finding Sinuses/Orbits: No acute finding Review of the MIP images confirms the above findings CTA NECK FINDINGS Aortic arch: Atheromatous  plaque which is extensive. Three vessel branching. No dissection or stenosis. Right carotid system: Diffuse atheromatous wall thickening with mixed density plaque at the bifurcation causing a short segment string sign over a 1 cm length, near the angle of the mandible. No ulceration or beading. Left carotid system: Atheromatous wall thickening of the common carotid with mixed density plaque at the bifurcation. No flow reducing stenosis, ulceration, or beading. Vertebral arteries: No proximal subclavian stenosis. The left vertebral artery is strongly dominant and widely patent. Diminutive and relatively faintly enhancing right vertebral artery proximally but patent to the dura without visible beading or dissection. Skeleton: Generalized degenerative spurring without acute finding. Other neck: No acute finding Upper chest: Biapical subpleural fibrotic appearance. Review of the MIP images confirms the above findings CTA HEAD FINDINGS Anterior circulation: No major branch occlusion, beading, or proximal flow reducing stenosis. There is bilateral atheromatous calcification of the cavernous carotids. On thick MIPS, there is less density in the right MCA distribution diffusely. Negative for aneurysm. Posterior circulation: Left dominant vertebral artery. The diminutive right vertebral artery is patent with filling PICA. Atheromatous irregularity of the posterior cerebral arteries with moderate left P1 and advanced right P1/P2 stenoses. Venous sinuses: Unremarkable Review of the MIP images confirms the above findings IMPRESSION: 1. Severe atheromatous stenosis at the proximal right ICA with short segment string sign and intracranial relative underfilling. 2. Advanced stenosis in the right more than left proximal PCA. Electronically Signed   By: Tiburcio Pea M.D.   On: 05/10/2023 05:10   MR Brain W and Wo Contrast Result Date: 05/10/2023 CLINICAL DATA:  Acute neurologic deficit EXAM: MRI HEAD WITHOUT AND WITH CONTRAST  MRI CERVICAL SPINE WITHOUT AND WITH CONTRAST TECHNIQUE: Multiplanar, multiecho pulse sequences of the brain and surrounding structures, and cervical spine, to include the craniocervical junction and cervicothoracic junction, were obtained without and with intravenous contrast. CONTRAST:  7mL GADAVIST GADOBUTROL 1 MMOL/ML IV SOLN COMPARISON:  None Available. FINDINGS: MRI HEAD FINDINGS Brain: Multifocal abnormal diffusion restriction within the right MCA and PCA territories, including the right precentral gyrus. Fewer than 5 scattered chronic microhemorrhages in a nonspecific pattern. There is multifocal hyperintense T2-weighted signal within the white matter. Parenchymal volume and CSF spaces are normal. Old left cerebellar infarct. The midline structures are normal. There is no abnormal contrast enhancement. Vascular: Attenuated right ICA flow void. Skull and upper cervical spine: Normal calvarium and skull base. Visualized upper cervical spine and soft tissues are normal. Sinuses/Orbits:No paranasal sinus fluid levels or advanced mucosal thickening. No mastoid or middle ear effusion. Normal orbits. MRI CERVICAL SPINE FINDINGS Alignment: Physiologic. Vertebrae: No fracture, evidence of discitis, or bone lesion. Cord: Normal signal and morphology. Posterior  Fossa, vertebral arteries, paraspinal tissues: Abnormal proximal right vertebral artery V2 segment flow void. Disc levels: C1-2: Unremarkable. C2-3: Normal disc. Mild facet hypertrophy. There is no spinal canal stenosis. No neural foraminal stenosis. C3-4: Right uncovertebral hypertrophy. There is no spinal canal stenosis. Moderate right neural foraminal stenosis. C4-5: Right foraminal disc osteophyte complex. Mild spinal canal stenosis. Severe right neural foraminal stenosis. C5-6: Bilateral uncovertebral hypertrophy. There is no spinal canal stenosis. Moderate right and severe left neural foraminal stenosis. C6-7: Left uncovertebral spurring. There is no spinal  canal stenosis. Severe left neural foraminal stenosis. C7-T1: Disc space narrowing without herniation. There is no spinal canal stenosis. No neural foraminal stenosis. IMPRESSION: 1. Multifocal acute infarcts within the right MCA and PCA territories, including the right precentral gyrus. 2. Attenuated right ICA flow void at the skull base, likely stenosis. 3. Abnormal right vertebral artery V2 segment flow void, consistent with slow flow or occlusion. The distal right vertebral artery flow void is maintained. 4. Mild spinal canal stenosis and severe right neural foraminal stenosis at C4-5. 5. Moderate right and severe left C5-6 and C6-7 neural foraminal stenosis. Electronically Signed   By: Deatra Robinson M.D.   On: 05/10/2023 00:47   MR Cervical Spine W and Wo Contrast Result Date: 05/10/2023 CLINICAL DATA:  Acute neurologic deficit EXAM: MRI HEAD WITHOUT AND WITH CONTRAST MRI CERVICAL SPINE WITHOUT AND WITH CONTRAST TECHNIQUE: Multiplanar, multiecho pulse sequences of the brain and surrounding structures, and cervical spine, to include the craniocervical junction and cervicothoracic junction, were obtained without and with intravenous contrast. CONTRAST:  7mL GADAVIST GADOBUTROL 1 MMOL/ML IV SOLN COMPARISON:  None Available. FINDINGS: MRI HEAD FINDINGS Brain: Multifocal abnormal diffusion restriction within the right MCA and PCA territories, including the right precentral gyrus. Fewer than 5 scattered chronic microhemorrhages in a nonspecific pattern. There is multifocal hyperintense T2-weighted signal within the white matter. Parenchymal volume and CSF spaces are normal. Old left cerebellar infarct. The midline structures are normal. There is no abnormal contrast enhancement. Vascular: Attenuated right ICA flow void. Skull and upper cervical spine: Normal calvarium and skull base. Visualized upper cervical spine and soft tissues are normal. Sinuses/Orbits:No paranasal sinus fluid levels or advanced mucosal  thickening. No mastoid or middle ear effusion. Normal orbits. MRI CERVICAL SPINE FINDINGS Alignment: Physiologic. Vertebrae: No fracture, evidence of discitis, or bone lesion. Cord: Normal signal and morphology. Posterior Fossa, vertebral arteries, paraspinal tissues: Abnormal proximal right vertebral artery V2 segment flow void. Disc levels: C1-2: Unremarkable. C2-3: Normal disc. Mild facet hypertrophy. There is no spinal canal stenosis. No neural foraminal stenosis. C3-4: Right uncovertebral hypertrophy. There is no spinal canal stenosis. Moderate right neural foraminal stenosis. C4-5: Right foraminal disc osteophyte complex. Mild spinal canal stenosis. Severe right neural foraminal stenosis. C5-6: Bilateral uncovertebral hypertrophy. There is no spinal canal stenosis. Moderate right and severe left neural foraminal stenosis. C6-7: Left uncovertebral spurring. There is no spinal canal stenosis. Severe left neural foraminal stenosis. C7-T1: Disc space narrowing without herniation. There is no spinal canal stenosis. No neural foraminal stenosis. IMPRESSION: 1. Multifocal acute infarcts within the right MCA and PCA territories, including the right precentral gyrus. 2. Attenuated right ICA flow void at the skull base, likely stenosis. 3. Abnormal right vertebral artery V2 segment flow void, consistent with slow flow or occlusion. The distal right vertebral artery flow void is maintained. 4. Mild spinal canal stenosis and severe right neural foraminal stenosis at C4-5. 5. Moderate right and severe left C5-6 and C6-7 neural foraminal stenosis. Electronically Signed  By: Deatra Robinson M.D.   On: 05/10/2023 00:47   CT HEAD WO CONTRAST ( ) Result Date: 05/09/2023 CLINICAL DATA:  Altered level of consciousness, intermittent left arm numbness for 1 month, left facial numbness since yesterday EXAM: CT HEAD WITHOUT CONTRAST TECHNIQUE: Contiguous axial images were obtained from the base of the skull through the vertex  without intravenous contrast. RADIATION DOSE REDUCTION: This exam was performed according to the departmental dose-optimization program which includes automated exposure control, adjustment of the mA and/or kV according to patient size and/or use of iterative reconstruction technique. COMPARISON:  09/12/2018 FINDINGS: Brain: There is diffuse cerebral atrophy, likely appropriate for age. Extensive hypodensities are seen throughout the periventricular white matter compatible with chronic small vessel ischemic changes. No evidence of acute infarct or hemorrhage. Lateral ventricles and midline structures are unremarkable. No acute extra-axial fluid collections. No mass effect. Vascular: No hyperdense vessel or unexpected calcification. Skull: Normal. Negative for fracture or focal lesion. Sinuses/Orbits: No acute finding. Other: None. IMPRESSION: 1. Chronic small-vessel ischemic changes and diffuse cerebral atrophy. 2. No acute intracranial process. Electronically Signed   By: Sharlet Salina M.D.   On: 05/09/2023 17:23    EKG: Independently reviewed.  Normal sinus rhythm.  Assessment/Plan Principal Problem:   Acute CVA (cerebrovascular accident) [facility]) Active Problems:   Vitamin B12 deficiency   Essential hypertension   Anemia   History of gout    Acute CVA -   appreciate neurology consult and recommendation.  Discussed with Dr. Derry Lory on-call neurologist.  Patient is placed on Plavix loading dose along with aspirin.  Patient does have right ICA stenosis with string sign awaiting further recommendation from stroke team.  On neurochecks swallow evaluation check lipid panel hemoglobin A1c and 2D echo.  Physical therapy consult. Hypertension will allow for permissive hypertension hold antihypertensives for now.  As needed IV hydralazine for systolic more than 220. Chronic anemia with B12 deficiency on B12 supplements.  Check anemia panel. History of gout on allopurinol. Lung nodule being followed by  pulmonologist originally scheduled for biopsy today.  Since patient has acute CVA will need close monitoring and more than 2 midnight stay.   DVT prophylaxis: Lovenox. Code Status: Full code. Family Communication: His wife and daughters at the bedside. Disposition Plan: Monitored bed. Consults called: Neurologist. Admission status: Observation.

## 2023-05-10 NOTE — Consult Note (Addendum)
 NEUROLOGY CONSULT NOTE   Date of service: May 10, 2023 Patient Name: Gurveer A. Portocarrero MRN:  [id number] DOB:  [birth year] Chief Complaint: "Left-sided numbness and incoordination" Requesting Provider: Gloris Manchester, MD  History of Present Illness  Bralin A. Stracener is a 89 y.o. 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  He reports that he first noticed the symptoms around early January.  The symptoms were short duration and lasted several hours and went away.  He reports over the last couple weeks, he has had some numbness in his left arm and left leg and has been having to rely more on his walker to walk.  On Sunday, he was unable to get to his church.  He finally agreed to come to the ED for further evaluation workup.  LKW: 2 weeks ago Modified rankin score: 1-No significant post stroke disability and can perform usual duties with stroke symptoms IV Thrombolysis: Not offered, he is outside the window.   EVT: Not offered, outside of window.  NIHSS components Score: Comment  1a Level of Conscious 0[x] 1[] 2[] 3[]     1b LOC Questions 0[x] 1[] 2[]      1c LOC Commands 0[x] 1[] 2[]      2 Best Gaze 0[x] 1[] 2[]      3 Visual 0[x] 1[] 2[] 3[]     4 Facial Palsy 0[x] 1[] 2[] 3[]     5a Motor Arm - left 0[x] 1[] 2[] 3[] 4[] UN[]   5b Motor Arm - Right 0[x] 1[] 2[] 3[] 4[] UN[]   6a Motor Leg - Left 0[x] 1[] 2[] 3[] 4[] UN[]   6b Motor Leg - Right 0[x] 1[] 2[] 3[] 4[] UN[]   7 Limb Ataxia 0[] 1[] 2[x] 3[] UN[]    8 Sensory 0[] 1[x] 2[] UN[]     9 Best Language 0[x] 1[] 2[] 3[]     10 Dysarthria 0[x] 1[] 2[] UN[]     11  Extinct. and Inattention 0[x]  1[]  2[]       TOTAL: 3      ROS  Comprehensive ROS performed and pertinent positives  documented in HPI   Past History   Past Medical History:  Diagnosis Date   DUODENITIS, WITHOUT HEMORRHAGE 07/14/2007   ERECTILE DYSFUNCTION 07/14/2007   Esophageal stricture 03/19/2016   ESOPHAGITIS 07/14/2007   EXTERNAL HEMORRHOIDS 07/14/2007   GERD 07/14/2007   Gout    HYPERTENSION 07/14/2007   PERIPHERAL NEUROPATHY 07/14/2007   Peripheral neuropathy 04/06/2019   PERIPHERAL VASCULAR DISEASE 07/14/2007   PODAGRA 07/14/2007   Prostate cancer (HCC) 02/2012   pt had screening and then biopsy   Retinal tear of right eye 03/19/2016   TOBACCO ABUSE, HX OF 02/12/2010   TONSILLECTOMY, HX OF 07/14/2007   TREMOR, ESSENTIAL 07/14/2007   VITAMIN B12 DEFICIENCY 07/14/2007    Past Surgical History:  Procedure Laterality Date   Excision of lipoma     PROSTATE BIOPSY Bilateral 03/10/2012   surgery to reduce turbinate and straighten deviated septum  2016   TONSILLECTOMY      Family History: Family History  Problem Relation Age of Onset   Hypothyroidism Daughter    Breast cancer Other    Cancer Mother    Other Father    Breast cancer  Sister     Social History  reports that he has quit smoking. His smoking use included cigarettes. He started smoking about 29 years ago. He has a 21.9 pack-year smoking history. He has quit using smokeless tobacco. He reports that he does not currently use alcohol. He reports that he does not use drugs.  Allergies  Allergen Reactions   Nitrofuran Derivatives Rash   Ciprofloxacin Hcl Other (See Comments)    numbness    Medications  No current facility-administered medications for this encounter.  Current Outpatient Medications:    allopurinol (ZYLOPRIM) 100 MG tablet, TAKE 1 TABLET BY MOUTH EVERY DAY, Disp: 90 tablet, Rfl: 4   amLODipine (NORVASC) 2.5 MG tablet, Take 1 tablet (2.5 mg total) by mouth daily., Disp: 90 tablet, Rfl: 3   Cyanocobalamin (B-12) 3000 MCG CAPS, Take 3,000 mcg by mouth daily., Disp: , Rfl:    docusate sodium (COLACE) 100 MG capsule,  Take 100 mg by mouth once., Disp: , Rfl:    omeprazole (PRILOSEC) 40 MG capsule, TAKE 1 CAPSULE BY MOUTH EVERY DAY, Disp: 90 capsule, Rfl: 2   Vitamins-Lipotropics (LIPOFLAVONOID PO), Take 2 capsules by mouth daily., Disp: , Rfl:    aspirin EC 81 MG tablet, Take 81 mg by mouth daily. Swallow whole. (Patient not taking: Reported on 05/09/2023), Disp: , Rfl:   Vitals   Vitals:   05/09/23 1648 05/09/23 2004 05/09/23 2050 05/10/23 0357  BP:  136/76 (!) 141/83 (!) 138/97  Pulse: 94 89 93 92  Resp:  (!) 22 18 18   Temp:  99.1 F (37.3 C) 99.1 F (37.3 C) 98.4 F (36.9 C)  TempSrc:  Oral  Oral  SpO2: 93% 94% 96% 100%  Weight: 74.9 kg     Height: 6' (1.829 m)       Body mass index is 22.39 kg/m.  Physical Exam   General: Laying comfortably in bed; in no acute distress.  HENT: Normal oropharynx and mucosa. Normal external appearance of ears and nose.  Neck: Supple, no pain or tenderness  CV: No JVD. No peripheral edema.  Pulmonary: Symmetric Chest rise. Normal respiratory effort.  Abdomen: Soft to touch, non-tender.  Ext: No cyanosis, edema, or deformity  Skin: No rash. Normal palpation of skin.   Musculoskeletal: Normal digits and nails by inspection. No clubbing.   Neurologic Examination  Mental status/Cognition: Alert, oriented to self, place, month and year, good attention.  Speech/language: Fluent, comprehension intact, object naming intact, repetition intact.  Cranial nerves:   CN II Pupils equal and reactive to light, no VF deficits    CN III,IV,VI EOM intact, no gaze preference or deviation, no nystagmus    CN V normal sensation in V1, V2, and V3 segments bilaterally    CN VII no asymmetry, no nasolabial fold flattening    CN VIII normal hearing to speech    CN IX & X normal palatal elevation, no uvular deviation    CN XI 5/5 head turn and 5/5 shoulder shrug bilaterally    CN XII midline tongue protrusion    Motor:  Muscle bulk: Normal, tone normal Mvmt Root Nerve   Muscle Right Left Comments  SA C5/6 Ax Deltoid 5 5   EF C5/6 Mc Biceps 5 5   EE C6/7/8 Rad Triceps 5 5   WF C6/7 Med FCR     WE C7/8 PIN ECU     F Ab C8/T1 U ADM/FDI 5 5   HF L1/2/3 Fem Illopsoas 5 4   KE  L2/3/4 Fem Quad 5 5   DF L4/5 D Peron Tib Ant 5 5   PF S1/2 Tibial Grc/Sol 5 5    Sensation:  Light touch Decreased slightly to touch left upper extremity.   Pin prick    Temperature    Vibration   Proprioception    Coordination/Complex Motor:  - Finger to Nose with ataxia left upper extremity - Heel to shin with ataxia and left lower extremity - Rapid alternating movement are slowed in left upper extremity. - Gait: Deferred for patient safety.  Labs/Imaging/Neurodiagnostic studies   CBC:  Recent Labs  Lab May 22, 2023 1654  WBC 7.3  NEUTROABS 5.7  HGB 11.3*  HCT 34.8*  MCV 89.2  PLT 239   Basic Metabolic Panel:  Lab Results  Component Value Date   NA 136 05/22/23   K 4.2 2023/05/22   CO2 25 05-22-23   GLUCOSE 106 (H) May 22, 2023   BUN 15 05/22/23   CREATININE 0.88 2023/05/22   CALCIUM 9.3 22-May-2023   GFRNONAA >60 2023-05-22   GFRAA 122 09/08/2007   Lipid Panel:  Lab Results  Component Value Date   LDLCALC 86 11/06/2022   HgbA1c:  Lab Results  Component Value Date   HGBA1C 6.0 11/06/2022   Urine Drug Screen: No results found for: "LABOPIA", "COCAINSCRNUR", "LABBENZ", "AMPHETMU", "THCU", "LABBARB"  Alcohol Level No results found for: "ETH" INR No results found for: "INR" APTT No results found for: "APTT" AED levels: No results found for: "PHENYTOIN", "ZONISAMIDE", "LAMOTRIGINE", "LEVETIRACETA"  CT Head without contrast(Personally reviewed): CTH was negative for a large hypodensity concerning for a large territory infarct or hyperdensity concerning for an ICH  CT angio Head and Neck with contrast(Personally reviewed): Right proximal ICA severe stenosis in the neck with radiographic string sign  MRI Brain(Personally reviewed): Right MCA and  right PCA territory infarcts.  ASSESSMENT   Keionte A. Scheer is a 89 y.o. 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  He was found to have multifocal right MCA and right PCA distribution infarcts along with noted right proximal ICA severe stenosis in the neck with radiographic string sign.  Suspect the etiology of his stroke is likely large artery atherosclerosis.  RECOMMENDATIONS  - Frequent Neuro checks per stroke unit protocol - Recommend obtaining TTE  - Recommend obtaining Lipid panel with LDL - Please start statin if LDL > 70 - Recommend HbA1c to evaluate for diabetes and how well it is controlled. - Antithrombotic -aspirin 325 mg once followed by aspirin 81 mg daily along with Plavix 300 mg p.o. once, followed by Plavix 75 mg daily. - Recommend DVT ppx - SBP goal - permissive hypertension first 24 h < 220/110. Held home meds.  - Recommend Telemetry monitoring for arrythmia - Recommend bedside swallow screen prior to PO intake. - Stroke education booklet - Recommend PT/OT/SLP consult - stroke team to evaluate this AM and consider consult for intervention.  ______________________________________________________________________    Welton Flakes, MD [facility]

## 2023-05-10 NOTE — Plan of Care (Signed)

## 2023-05-10 NOTE — Progress Notes (Signed)
 Echo attempted at 2:00, patient is about to go upstairs per RN. Will retry as schedule permits.  [facility] Liara Holm RDCS

## 2023-05-10 NOTE — Consult Note (Addendum)
 Hospital Consult    Reason for Consult:  symptomatic right carotid artery stenosis Requesting Physician:  ED MRN #:  [id number]  History of Present Illness: This is a 89 y.o. male who presented to the hospital with symptomatic carotid artery stenosis with left sided weakness.  His wife of 67 years states that he started having some left arm numbness and weakness back in January.  Pt states it would come and go.  He would have a hard time picking things up with his left hand.  Over time, this has progressively been lasting longer.  He states he does have neuropathy in his feet but with his left leg weakness, it has made it harder to walk.  He and his wife were going to go to church yesterday and pt stated he just couldn't walk to go bc of the left leg weakness.    His wife states that his PCP had recommended a statin for his cholesterol but a therapist they know advised them against it due to muscle cramping so they have tried to avoid this.    He was scheduled for a lung biopsy this am and has been off his baby asa for about a week.  Otherwise, he has never been on blood thinners.  He denies any cramping with walking or non healing ulcers on his feet.  His family inquires about his white fingernails and that it is difficult getting an O2 sat on his fingers except the pinky.    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    The pt is  on a statin for cholesterol management.  The pt is on a daily aspirin.   Other AC:  plavix/Lovenox (DVT prophylaxis) The pt is not on medication for hypertension.   The pt is not on medication for diabetes PTA. Tobacco hx:  former  Past Medical History:  Diagnosis Date   DUODENITIS, WITHOUT HEMORRHAGE 07/14/2007   ERECTILE DYSFUNCTION 07/14/2007   Esophageal stricture 03/19/2016   ESOPHAGITIS 07/14/2007   EXTERNAL HEMORRHOIDS 07/14/2007   GERD 07/14/2007   Gout     HYPERTENSION 07/14/2007   PERIPHERAL NEUROPATHY 07/14/2007   Peripheral neuropathy 04/06/2019   PERIPHERAL VASCULAR DISEASE 07/14/2007   PODAGRA 07/14/2007   Prostate cancer (HCC) 02/2012   pt had screening and then biopsy   Retinal tear of right eye 03/19/2016   TOBACCO ABUSE, HX OF 02/12/2010   TONSILLECTOMY, HX OF 07/14/2007   TREMOR, ESSENTIAL 07/14/2007   VITAMIN B12 DEFICIENCY 07/14/2007    Past Surgical History:  Procedure Laterality Date   Excision of lipoma     PROSTATE BIOPSY Bilateral 03/10/2012   surgery to reduce turbinate and straighten deviated septum  2016   TONSILLECTOMY      Allergies  Allergen Reactions   Nitrofuran Derivatives Rash   Ciprofloxacin Hcl Other (See Comments)    numbness    Prior to Admission medications   Medication Sig Start Date End Date Taking? Authorizing Provider  allopurinol (ZYLOPRIM) 100 MG tablet TAKE 1 TABLET BY MOUTH EVERY DAY 06/02/22  Yes Nadara Mustard, MD  amLODipine (NORVASC) 2.5 MG tablet Take 1 tablet (2.5 mg total) by mouth daily. 07/03/22  Yes Corwin Levins, MD  Cyanocobalamin (B-12) 3000 MCG CAPS Take 3,000 mcg by mouth daily.   Yes [provider]  docusate sodium (COLACE) 100 MG capsule Take 100 mg by mouth once.  Yes [provider]  omeprazole (PRILOSEC) 40 MG capsule TAKE 1 CAPSULE BY MOUTH EVERY DAY 03/31/23  Yes Corwin Levins, MD  Vitamins-Lipotropics (LIPOFLAVONOID PO) Take 2 capsules by mouth daily.   Yes [provider]  aspirin EC 81 MG tablet Take 81 mg by mouth daily. Swallow whole. Patient not taking: Reported on 05/09/2023    [provider]    Social History   Socioeconomic History   Marital status: Married    Spouse name: Not on file   Number of children: 2   Years of education: 12   Highest education level: Not on file  Occupational History   Occupation: retired    Associate Professor: RETIRED  Tobacco Use   Smoking status: Former    Current packs/day: 0.75    Average packs/day:  0.8 packs/day for 29.2 years (21.9 ttl pk-yrs)    Types: Cigarettes    Start date: 02/23/1994   Smokeless tobacco: Former  Building services engineer status: Never Used  Substance and Sexual Activity   Alcohol use: Not Currently    Comment: d/c beer about 5 months ago   Drug use: No   Sexual activity: Yes    Partners: Female  Other Topics Concern   Not on file  Social History Narrative   HSG. Married '1958. 2 dtrs; 4 g-children. Work - retired. 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       Not disabled      Retired since 1986      Right handed      Two story home   Social Drivers of Health   Financial Resource Strain: Low Risk  (08/04/2022)   Overall Financial Resource Strain (CARDIA)    Difficulty of Paying Living Expenses: Not hard at all  Food Insecurity: No Food Insecurity (08/04/2022)   Hunger Vital Sign    Worried About Running Out of Food in the Last Year: Never true    Ran Out of Food in the Last Year: Never true  Transportation Needs: No Transportation Needs (08/04/2022)   PRAPARE - Administrator, Civil Service (Medical): No    Lack of Transportation (Non-Medical): No  Physical Activity: Sufficiently Active (08/04/2022)   Exercise Vital Sign    Days of Exercise per Week: 5 days    Minutes of Exercise per Session: 30 min  Stress: No Stress Concern Present (08/04/2022)   [employer] of Occupational Health - Occupational Stress Questionnaire    Feeling of Stress : Not at all  Social Connections: Socially Integrated (08/04/2022)   Social Connection and Isolation Panel [NHANES]    Frequency of Communication with Friends and Family: More than three times a week    Frequency of Social Gatherings with Friends and Family: More than three times a week    Attends Religious Services: More than 4 times per year    Active Member of [employer] or Organizations: Yes    Attends Engineer, structural:  More than 4 times per year    Marital Status: Married  Catering manager Violence: Not At Risk (08/04/2022)   Humiliation, Afraid, Rape, and Kick questionnaire    Fear of Current or Ex-Partner: No    Emotionally Abused: No    Physically Abused: No    Sexually Abused: No    Family History  Problem Relation Age of Onset   Hypothyroidism Daughter    Breast cancer Other  Cancer Mother    Other Father    Breast cancer Sister     ROS: [x]  Positive   [ ]  Negative   [ ]  All sytems reviewed and are negative  Cardiac: []  chest pain/pressure []  hx MI []  SOB   Vascular: []  pain in legs while walking []  pain in legs at rest []  pain in legs at night []  non-healing ulcers []  hx of DVT []  swelling in legs  Pulmonary: [x]  lung nodule  Neurologic: [x]  hx of CVA-see HPI []  mini stroke   Hematologic: []  hx of cancer  Endocrine:   []  diabetes []  thyroid disease  GI [x]  GERD  GU: []  CKD/renal failure []  HD--[]  M/W/F or []  T/T/S  Psychiatric: []  anxiety []  depression  Musculoskeletal: []  arthritis []  joint pain  Integumentary: []  rashes []  ulcers  Constitutional: []  fever  []  chills  Physical Examination  Vitals:   05/10/23 0357 05/10/23 1015  BP: (!) 138/97 131/66  Pulse: 92 71  Resp: 18 16  Temp: 98.4 F (36.9 C) 98.6 F (37 C)  SpO2: 100% 95%   Body mass index is 22.39 kg/m.  General:  WDWN in NAD Gait: Not observed HENT: WNL, normocephalic Pulmonary: normal non-labored breathing Cardiac: regular Skin: with ecchymosis left forearm Vascular Exam/Pulses:  Right Left  Radial 2+ (normal) 2+ (normal)   Musculoskeletal: no muscle wasting or atrophy  Neurologic: A&O X 3 Psychiatric:  The pt has Normal affect.   CBC    Component Value Date/Time   WBC 6.7 05/10/2023 0939   RBC 4.11 (L) 05/10/2023 0939   RBC 4.09 (L) 05/10/2023 0939   HGB 11.6 (L) 05/10/2023 0939   HCT 37.4 (L) 05/10/2023 0939   PLT 233 05/10/2023 0939   MCV 91.0 05/10/2023  0939   MCH 28.2 05/10/2023 0939   MCHC 31.0 05/10/2023 0939   RDW 14.5 05/10/2023 0939   LYMPHSABS 1.0 05/09/2023 1654   MONOABS 0.6 05/09/2023 1654   EOSABS 0.0 05/09/2023 1654   BASOSABS 0.0 05/09/2023 1654    BMET    Component Value Date/Time   NA 133 (L) 05/10/2023 0939   K 3.8 05/10/2023 0939   CL 99 05/10/2023 0939   CO2 23 05/10/2023 0939   GLUCOSE 114 (H) 05/10/2023 0939   BUN 12 05/10/2023 0939   CREATININE 0.80 05/10/2023 0939   CALCIUM 9.2 05/10/2023 0939   GFRNONAA >60 05/10/2023 0939   GFRAA 122 09/08/2007 1631       Non-Invasive Vascular Imaging:   CTA head/neck 05/10/2023 IMPRESSION: 1. Severe atheromatous stenosis at the proximal right ICA with short segment string sign and intracranial relative underfilling. 2. Advanced stenosis in the right more than left proximal PCA    ASSESSMENT/PLAN: This is a 89 y.o. male with symptomatic right carotid artery stenosis   -pt has been started on Plavix and continued on baby asa.  He has been started on a statin as well.  -discussed with pt and family plan for probable OR on Friday for right TCAR.  Discussed that he most likely would need to be on Plavix for one month and then should be able to stop plavix and continue with baby asa.  Also discussed that he will need to be on a statin and if he has myalgias, we could try a different statin.  I also discussed with them if they go home, he should continue with the Plavix as sometimes, TCAR pts are told by pre op to stop taking plavix.   -Dr.  Karin Lieu to evaluate pt and determine further plan   Doreatha Massed, PA-C [facility] [phone]  VASCULAR STAFF ADDENDUM: I have independently interviewed and examined the patient. I agree with the above.  I had a nice conversation with Stephen Meza, his wife, and 2 daughters this afternoon. A native of [place], he continues to live an active, independent lifestyle at the age of 30.  He farms over an acre, and  cans a significant amount of food-which is probably why he is so healthy at his age.  On exam, he has symmetric strength.  Difficulty picking things up with his left hand stating he has some drift.  We had a long discussion regarding the right internal carotid artery stenosis.  We discussed that intervention lowers the risk of future stroke from 26% to 9%.  I offered both carotid endarterectomy versus transcarotid artery revascularization.  We had a nice risk benefits discussion, and focused on shared decision making.  After discussing both options, Stephen Meza and his wife elected to proceed with transcarotid artery revascularization.  This will be scheduled for Friday.  The patient does not need to remain in house until Friday if he is deemed ready for discharge.   Victorino Sparrow MD [facility] Phone Number: [phone] 05/10/2023 5:03 PM

## 2023-05-10 NOTE — Progress Notes (Signed)
 Same day note  Stephen Meza is a 89 y.o. male with history of hypertension, gout, prior history of bladder cancer, lung nodule scheduled for lung biopsy 05/10/23, B12 deficiency neuropathy presented to the ED with intermittent weakness and numbness of the left side of his body for few weeks.  For last 3 days symptoms were pronounced and patient had some difficulty walking as well.  In the ED MRI of the brain showed  multifocal acute infarct involving the right MCA and PCA territory.  CT angiogram head and neck shows right ICA stenosis with string sign.  Neurology on-call was consulted patient was placed on Plavix, aspirin and patient was admitted for further management of acute stroke. Labs show hemoglobin of 11.3 EKG shows normal sinus rhythm  Patient seen and examined at bedside.  Patient was admitted to the hospital for intermittent weakness and numbness of the left side of his body  At the time of my evaluation, patient complains of feeling okay still has some numbness on the left side of the body.  Physical examination reveals elderly male, not in obvious distress.  Eating food.  Laboratory data and imaging was reviewed  Assessment and Plan.  Acute ischemic CVA  Significant right ICA stenosis Seen by neurology. Neurology recommends vascular surgery consultation for right ICA stenosis with string sign.  Continue aspirin statin and Plavix.  Check lipid panel, hemoglobin A1c, 2D echocardiogram, PT OT.  Communicated with Dr. Sherral Hammers vascular surgery for consultation.  Likely TCAR this admission.  Hypertension Permissive hypertension for now.  IV hydralazine for SBP more than 220.  Chronic anemia with B12 deficiency.  Continue vitamin B12 supplements.   Check anemia panel.  History of gout on allopurinol.  Will continue  Lung nodule being followed by pulmonologist originally scheduled for biopsy today.  Currently in the hospital.  Spoke with the patient's spouse and daughter at  bedside.  No Charge  Signed,  Tenny Craw, MD [facility]

## 2023-05-10 NOTE — ED Notes (Signed)
 Pt remains in hallway, unable to complete central cardiac monitoring d/t location

## 2023-05-10 NOTE — Hospital Course (Signed)
 Same day note  Stephen Meza is a 89 y.o. male with history of hypertension, gout, prior history of bladder cancer, lung nodule scheduled for lung biopsy 05/10/23, B12 deficiency neuropathy presented to the ED with intermittent weakness and numbness of the left side of his body for few weeks.  For last 3 days symptoms were pronounced and patient had some difficulty walking as well.  In the ED MRI of the brain showed  multifocal acute infarct involving the right MCA and PCA territory.  CT angiogram head and neck shows right ICA stenosis with string sign.  Neurology on-call was consulted patient was placed on Plavix, aspirin and patient was admitted for further management of acute stroke. Labs show hemoglobin of 11.3 EKG shows normal sinus rhythm  Patient seen and examined at bedside.  Patient was admitted to the hospital for intermittent weakness and numbness of the left side of his body  At the time of my evaluation, patient complains of  Physical examination reveals  Laboratory data and imaging was reviewed  Assessment and Plan.  Acute ischemic CVA  Seen by neurology and on aspirin and Plavix.  Neurology recommends vascular surgery consultation for right ICA stenosis with string sign.  Check lipid panel, hemoglobin A1c, 2D echocardiogram, PT OT.  Will reach out to vascular surgery for consultation.  Hypertension Permissive hypertension for now.  IV hydralazine for SBP more than 220.  Chronic anemia with B12 deficiency.  Continue vitamin B12 supplements.   Check anemia panel.  History of gout on allopurinol.  Will continue  Lung nodule being followed by pulmonologist originally scheduled for biopsy today.  Currently in the hospital.  No Charge  Signed,  Tenny Craw, MD [facility]

## 2023-05-10 NOTE — Evaluation (Signed)
 Physical Therapy Evaluation Patient Details Name: Stephen Meza MRN: [id number] DOB: February 21, 1934 Today's Date: 05/10/2023  History of Present Illness  Pt is an 89 y/o M admitted on 05/09/23 after presenting to the ED with c/o numbness to L face & LUE/LLE x the last few weeks, with worsening ability to walk starting on Saturday (3 days prior).  MRI showed "multifocal acute infarcts within the right MCA & PCA  territories, including the right precentral gyrus." PMH: HTN, gout, bladder CA, lung nodule, B12 deficiency, neuropathy, PVD, essential tremor  Clinical Impression  Pt seen for PT evaluation with pt agreeable, wife & daughters present for session. Pt reports prior to admission he was mod I with SPC, denies falls. On this date, pt presents with decreased strength & coordination in LUE/LLE. Pt is able to ambulate with SPC but demonstrates improved balance but decreased awareness of gait with RW; pt requires min assist for gait. Pt would benefit from ongoing PT services to address deficits noted below to return to mod I PLOF. Pt would benefit from post acute rehab >3 hours therapy/day.        If plan is discharge home, recommend the following: A little help with walking and/or transfers;A little help with bathing/dressing/bathroom;Assistance with cooking/housework;Assist for transportation;Help with stairs or ramp for entrance   Can travel by private vehicle        Equipment Recommendations Rolling walker (2 wheels)  Recommendations for Other Services  Rehab consult    Functional Status Assessment Patient has had a recent decline in their functional status and demonstrates the ability to make significant improvements in function in a reasonable and predictable amount of time.     Precautions / Restrictions Precautions Precautions: Fall Recall of Precautions/Restrictions: Intact Restrictions Weight Bearing Restrictions Per Provider Order: No      Mobility  Bed Mobility                General bed mobility comments: pt received sitting EOB, left sitting in recliner    Transfers Overall transfer level: Needs assistance Equipment used: Straight cane Transfers: Sit to/from Stand Sit to Stand: Contact guard assist           General transfer comment: STS from EOB with SPC with CGA, cuing to use grab bar during toilet transfers & pt stands without assistance/notifiying PT of finished toileting    Ambulation/Gait Ambulation/Gait assistance: Min assist Gait Distance (Feet): 10 Feet (+ 20 ft) Assistive device: Rolling walker (2 wheels), Straight cane Gait Pattern/deviations: Decreased step length - right, Decreased step length - left, Decreased dorsiflexion - left, Decreased stride length Gait velocity: decreased     General Gait Details: Pt ambulates bed>bathroom with SPC with pt eventually carrying SPC in LUE & holding to sink for balance with RUE. After toileting provided pt with RW & pt ambulates with min assist with cuing re: need to ambulate within base of AD, how to position it when standing at sink for hand hygiene; pt would benefit from ongoing education. Mild L foot drag at times.  Stairs            Wheelchair Mobility     Tilt Bed    Modified Rankin (Stroke Patients Only)       Balance Overall balance assessment: Needs assistance Sitting-balance support: Feet supported Sitting balance-Leahy Scale: Good     Standing balance support: During functional activity, No upper extremity supported Standing balance-Leahy Scale: Poor Standing balance comment: leans anteriorly on sink for support when standing to  perform hand hygiene                             Pertinent Vitals/Pain Pain Assessment Pain Assessment: No/denies pain    Home Living Family/patient expects to be discharged to:: Private residence Living Arrangements: Spouse/significant other Available Help at Discharge: Family;Available 24 hours/day Type of  Home: House Home Access: Stairs to enter Entrance Stairs-Rails: None Entrance Stairs-Number of Steps: 1 via garage Alternate Level Stairs-Number of Steps: Pt's primary bed/bathroom are upstairs but does have bed/bathroom on main level he can use Home Layout: Two level;Able to live on main level with bedroom/bathroom Home Equipment: Cane - single point      Prior Function Prior Level of Function : Independent/Modified Independent             Mobility Comments: ambulatory with SPC without AD, reports he has his license but wife does most of the driving, denies falls       Extremity/Trunk Assessment   Upper Extremity Assessment Upper Extremity Assessment: Right hand dominant;LUE deficits/detail LUE Deficits / Details: LUE dsymetria, reports numbness & tingling in L arm & hand    Lower Extremity Assessment Lower Extremity Assessment: LLE deficits/detail;RLE deficits/detail (BLE neuropathy with impaired sensation to light touch & propriopception) RLE Sensation: decreased proprioception;history of peripheral neuropathy;decreased light touch LLE Deficits / Details: 4/5 hip flexion, knee flexion, knee extension in sitting, decreased coordination with heel-to-shin LLE LLE Sensation: decreased proprioception;decreased light touch;history of peripheral neuropathy       Communication   Communication Communication: No apparent difficulties    Cognition Arousal: Alert Behavior During Therapy: WFL for tasks assessed/performed   PT - Cognitive impairments: No apparent impairments                         Following commands: Intact       Cueing Cueing Techniques: Verbal cues     General Comments General comments (skin integrity, edema, etc.): Pt with continent void on toilet. Provided education re: strokes & purpose of therapy.    Exercises     Assessment/Plan    PT Assessment Patient needs continued PT services  PT Problem List Decreased strength;Decreased  coordination;Decreased activity tolerance;Decreased knowledge of use of DME;Impaired sensation;Decreased safety awareness;Decreased balance;Decreased mobility       PT Treatment Interventions DME instruction;Balance training;Modalities;Neuromuscular re-education;Gait training;Stair training;Functional mobility training;Therapeutic activities;Therapeutic exercise;Manual techniques;Patient/family education    PT Goals (Current goals can be found in the Care Plan section)  Acute Rehab PT Goals Patient Stated Goal: get better PT Goal Formulation: With patient/family Time For Goal Achievement: 05/24/23 Potential to Achieve Goals: Good    Frequency Min 3X/week     Co-evaluation               AM-PAC PT "6 Clicks" Mobility  Outcome Measure Help needed turning from your back to your side while in a flat bed without using bedrails?: None Help needed moving from lying on your back to sitting on the side of a flat bed without using bedrails?: A Little Help needed moving to and from a bed to a chair (including a wheelchair)?: A Little Help needed standing up from a chair using your arms (e.g., wheelchair or bedside chair)?: A Little Help needed to walk in hospital room?: A Little Help needed climbing 3-5 steps with a railing? : A Lot 6 Click Score: 18    End of Session   Activity Tolerance:  Patient tolerated treatment well Patient left: in chair;with chair alarm set;with call bell/phone within reach;with family/visitor present;with nursing/sitter in room Nurse Communication: Mobility status PT Visit Diagnosis: Unsteadiness on feet (R26.81);Hemiplegia and hemiparesis;Muscle weakness (generalized) (M62.81);Other abnormalities of gait and mobility (R26.89);Difficulty in walking, not elsewhere classified (R26.2) Hemiplegia - Right/Left: Left Hemiplegia - dominant/non-dominant: Non-dominant Hemiplegia - caused by: Cerebral infarction    Time: 0102-7253 PT Time Calculation (min) (ACUTE  ONLY): 18 min   Charges:   PT Evaluation $PT Eval Moderate Complexity: 1 Mod   PT General Charges $$ ACUTE PT VISIT: 1 Visit         Aleda Grana, PT, DPT 05/10/23, 4:39 PM   Sandi Mariscal 05/10/2023, 4:37 PM

## 2023-05-10 NOTE — Progress Notes (Signed)
 Attempted echo, but patient is with doctor at this time.

## 2023-05-11 ENCOUNTER — Other Ambulatory Visit: Payer: Self-pay

## 2023-05-11 ENCOUNTER — Inpatient Hospital Stay (HOSPITAL_COMMUNITY)

## 2023-05-11 DIAGNOSIS — I639 Cerebral infarction, unspecified: Secondary | ICD-10-CM | POA: Diagnosis not present

## 2023-05-11 DIAGNOSIS — I6389 Other cerebral infarction: Secondary | ICD-10-CM

## 2023-05-11 LAB — ECHOCARDIOGRAM COMPLETE
AR max vel: 3.13 cm2
AV Area VTI: 3.06 cm2
AV Area mean vel: 2.91 cm2
AV Mean grad: 6 mmHg
AV Peak grad: 8.5 mmHg
Ao pk vel: 1.46 m/s
Area-P 1/2: 2.13 cm2
Height: 72 in
MV VTI: 2.92 cm2
S' Lateral: 2.3 cm
Weight: 2641.99 [oz_av]

## 2023-05-11 LAB — BASIC METABOLIC PANEL
Anion gap: 8 (ref 5–15)
BUN: 12 mg/dL (ref 8–23)
CO2: 25 mmol/L (ref 22–32)
Calcium: 9.1 mg/dL (ref 8.9–10.3)
Chloride: 103 mmol/L (ref 98–111)
Creatinine, Ser: 0.93 mg/dL (ref 0.61–1.24)
GFR, Estimated: >60 mL/min (ref >60)
Glucose, Bld: 114 mg/dL — ABNORMAL HIGH (ref 70–99)
Potassium: 4.1 mmol/L (ref 3.5–5.1)
Sodium: 136 mmol/L (ref 135–145)

## 2023-05-11 LAB — CBC
HCT: 34.8 % — ABNORMAL LOW (ref 39.0–52.0)
Hemoglobin: 11 g/dL — ABNORMAL LOW (ref 13.0–17.0)
MCH: 28.5 pg (ref 26.0–34.0)
MCHC: 31.6 g/dL (ref 30.0–36.0)
MCV: 90.2 fL (ref 80.0–100.0)
Platelets: 239 10*3/uL (ref 150–400)
RBC: 3.86 MIL/uL — ABNORMAL LOW (ref 4.22–5.81)
RDW: 14.3 % (ref 11.5–15.5)
WBC: 7.1 10*3/uL (ref 4.0–10.5)
nRBC: 0 % (ref 0.0–0.2)

## 2023-05-11 LAB — PROTIME-INR
INR: 1 (ref 0.8–1.2)
Prothrombin Time: 13.8 s (ref 11.4–15.2)

## 2023-05-11 LAB — MAGNESIUM: Magnesium: 2.2 mg/dL (ref 1.7–2.4)

## 2023-05-11 NOTE — Evaluation (Signed)
 Speech Language Pathology Evaluation Patient Details Name: Stephen Meza MRN: [id number] DOB: 07/23/1933 Today's Date: 05/11/2023 Time: 7829-5621 SLP Time Calculation (min) (ACUTE ONLY): 15 min  Problem List:  Patient Active Problem List   Diagnosis Date Noted   Acute CVA (cerebrovascular accident) (HCC) 05/10/2023   History of gout 05/10/2023   Stroke (HCC) 05/10/2023   Abnormal breath sounds 03/29/2023   Lung mass 03/22/2023   Irregular heartbeat 11/10/2022   Anemia 05/03/2021   Tick bite, infected 08/07/2020   Peripheral neuropathy 04/06/2019   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   Low grade fever 04/06/2019   Bilateral leg pain 04/04/2018   Bilateral bunions 04/04/2018   Ganglion cyst of dorsum of right wrist 04/04/2018   Chronic low back pain 04/04/2018   Gait disorder 04/04/2018   Recurrent falls while walking 04/04/2018   Abrasion of knee, bilateral 04/04/2018   Bladder cancer (HCC) 03/19/2016   Esophageal stricture 03/19/2016   Retinal tear of right eye 03/19/2016   Dizziness 03/19/2016   Hyperglycemia 03/16/2015   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   Prostate cancer (HCC) 04/13/2012   Hyperlipidemia with target LDL less than 130 04/12/2012   Encounter for well adult exam with abnormal findings 03/11/2011   TOBACCO ABUSE, HX OF 02/12/2010   SHOULDER PAIN 10/10/2007   Vitamin B12 deficiency 07/14/2007   PODAGRA 07/14/2007   ERECTILE DYSFUNCTION 07/14/2007   TREMOR, ESSENTIAL 07/14/2007   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   Essential hypertension 07/14/2007   PERIPHERAL VASCULAR DISEASE 07/14/2007   External hemorrhoids 07/14/2007   Esophagitis 07/14/2007   GERD 07/14/2007   TONSILLECTOMY, HX OF 07/14/2007   Past Medical History:  Past Medical History:  Diagnosis Date   DUODENITIS, WITHOUT HEMORRHAGE 07/14/2007   ERECTILE DYSFUNCTION 07/14/2007   Esophageal stricture 03/19/2016    ESOPHAGITIS 07/14/2007   EXTERNAL HEMORRHOIDS 07/14/2007   GERD 07/14/2007   Gout    HYPERTENSION 07/14/2007   PERIPHERAL NEUROPATHY 07/14/2007   Peripheral neuropathy 04/06/2019   PERIPHERAL VASCULAR DISEASE 07/14/2007   PODAGRA 07/14/2007   Prostate cancer (HCC) 02/2012   pt had screening and then biopsy   Retinal tear of right eye 03/19/2016   TOBACCO ABUSE, HX OF 02/12/2010   TONSILLECTOMY, HX OF 07/14/2007   TREMOR, ESSENTIAL 07/14/2007   VITAMIN B12 DEFICIENCY 07/14/2007   Past Surgical History:  Past Surgical History:  Procedure Laterality Date   Excision of lipoma     PROSTATE BIOPSY Bilateral 03/10/2012   surgery to reduce turbinate and straighten deviated septum  2016   TONSILLECTOMY     HPI:  Stephen Meza is an 89 yo male presenting to ED 3/16 with L facial, LUE, LLE numbness x the last few weeks, now with three days of worsening ability to walk. MRI showed multifocal acute infarcts in the R MCA and PCA territories, including the R precentral gyrus. PMH includes HTN, gout, bladder cancer, lung nodule, B12 deficiency, neuropathy, PVD, essential tremor   Assessment / Plan / Recommendation Clinical Impression  Pt's wife and daughter report gradual onset of memory deficits PTA. He lives at home with his wife and is typically independent. Pt scored 16/30 on the SLUMS (which is below the score of 27 or above that is considered [facility]) characterized by deficits related to attention, memory (retrieval > storage), and awareness. Suspect hearing may have further impacted pt's performance. Discussed plan for SLP f/u with pt and his family, with which they were agreeable to target suspected  acute on chronic cognitive changes. SLP to s/o acutely but recommend f/u with [facility] upon d/c.    SLP Assessment  SLP Recommendation/Assessment: All further Speech Lanaguage Pathology  needs can be addressed in the next venue of care SLP Visit Diagnosis: Cognitive communication deficit (R41.841)     Recommendations for follow up therapy are one component of a multi-disciplinary discharge planning process, led by the attending physician.  Recommendations may be updated based on patient status, additional functional criteria and insurance authorization.    Follow Up Recommendations  Home health SLP    Assistance Recommended at Discharge  Intermittent Supervision/Assistance  Functional Status Assessment Patient has had a recent decline in their functional status and demonstrates the ability to make significant improvements in function in a reasonable and predictable amount of time.  Frequency and Duration           SLP Evaluation Cognition  Overall Cognitive Status: Impaired/Different from baseline Arousal/Alertness: Awake/alert Orientation Level: Oriented X4 Attention: Sustained Sustained Attention: Impaired Sustained Attention Impairment: Verbal basic Memory: Impaired Memory Impairment: Retrieval deficit Awareness: Impaired Awareness Impairment: Emergent impairment Problem Solving: Appears intact       Comprehension  Auditory Comprehension Overall Auditory Comprehension: Appears within functional limits for tasks assessed    Expression Expression Primary Mode of Expression: Verbal Verbal Expression Overall Verbal Expression: Appears within functional limits for tasks assessed   Oral / Motor  Oral Motor/Sensory Function Overall Oral Motor/Sensory Function: Within functional limits Motor Speech Overall Motor Speech: Appears within functional limits for tasks assessed            Gwynneth Aliment, M.A., CF-SLP Speech Language Pathology, Acute Rehabilitation Services  Secure Chat preferred [phone]  05/11/2023, 4:18 PM

## 2023-05-11 NOTE — Progress Notes (Signed)
 PROGRESS NOTE  Jawon A. Villavicencio OZH:[id number] DOB: 1933-06-10 DOA: 05/09/2023 PCP: Moshe Cipro, FNP   LOS: 1 day   Brief narrative:  Stephen Meza is a 89 y.o. male with history of hypertension, gout, prior history of bladder cancer, lung nodule scheduled for lung biopsy 05/10/23, B12 deficiency neuropathy presented to the ED with intermittent weakness and numbness of the left side of his body for few weeks.  For last 3 days symptoms were pronounced and patient had some difficulty walking as well.  Labs show hemoglobin of 11.3 EKG shows normal sinus rhythm. MRI of the brain showed  multifocal acute infarct involving the right MCA and PCA territory.  CT angiogram head and neck shows right ICA stenosis with string sign.  Neurology on-call was consulted patient was placed on Plavix, aspirin and patient was admitted for further management of acute stroke.     Assessment/Plan: Principal Problem:   Acute CVA (cerebrovascular accident) [facility]) Active Problems:   Vitamin B12 deficiency   Essential hypertension   Anemia   History of gout   Stroke [facility])  Acute ischemic CVA  Significant right ICA stenosis Seen by neurology. Neurology recommends vascular surgery consultation for right ICA stenosis with string sign.  Continue aspirin statin and plan for TCAR likely on Friday.   Hypertension Permissive hypertension for now.  IV hydralazine for SBP more than 220.   Chronic anemia with B12 deficiency.  Continue vitamin B12 supplements.   Check anemia panel.   History of gout on allopurinol.  Will continue   Lung nodule being followed by pulmonologist originally scheduled for biopsy today.  Currently in the hospital.  DVT prophylaxis: enoxaparin (LOVENOX) injection 40 mg Start: 05/10/23 1000   Disposition: Likely home with home health after surgical intervention.  Status is: Inpatient Remains inpatient appropriate because: Need for surgical intervention,    Code Status:      Code Status: Full Code  Family Communication: Spoke with the patient's spouse at bedside  Consultants:  neurology Vascular surgery  Procedures: None so far  Anti-infectives:  None  Anti-infectives (From admission, onward)    None       Subjective: Today, patient was seen and examined at bedside.  Ambulated to the bathroom.  Still with facial and left numbness weakness.  Objective: Vitals:   05/11/23 0733 05/11/23 1115  BP: (!) 170/72   Pulse: 61   Resp: 16 15  Temp: 98.4 F (36.9 C)   SpO2: 96%     Intake/Output Summary (Last 24 hours) at 05/11/2023 1128 Last data filed at 05/10/2023 1700 Gross per 24 hour  Intake 25.11 ml  Output --  Net 25.11 ml   Filed Weights   05/09/23 1648  Weight: 74.9 kg   Body mass index is 22.39 kg/m.   Physical Exam:  GENERAL: Patient is alert awake and oriented. Not in obvious distress.  Elderly male, Communicative. HENT: No scleral pallor or icterus. Pupils equally reactive to light. Oral mucosa is moist NECK: is supple, no gross swelling noted. CHEST: Clear to auscultation. No crackles or wheezes.   CVS: S1 and S2 heard, no murmur. Regular rate and rhythm.  ABDOMEN: Soft, non-tender, bowel sounds are present. EXTREMITIES: No edema. CNS: Mild left facial droop.  Mild weakness of the left arm and leg. SKIN: warm and dry without rashes.  Data Review: I have personally reviewed the following laboratory data and studies,  CBC: Recent Labs  Lab 05/09/23 1654 05/10/23 0939 05/11/23 0506  WBC 7.3 6.7 7.1  NEUTROABS 5.7  --   --   HGB 11.3* 11.6* 11.0*  HCT 34.8* 37.4* 34.8*  MCV 89.2 91.0 90.2  PLT 239 233 239   Basic Metabolic Panel: Recent Labs  Lab 05/09/23 1654 05/10/23 0939 05/11/23 0506  NA 136 133* 136  K 4.2 3.8 4.1  CL 101 99 103  CO2 25 23 25   GLUCOSE 106* 114* 114*  BUN 15 12 12   CREATININE 0.88 0.80 0.93  CALCIUM 9.3 9.2 9.1  MG  --   --  2.2   Liver Function Tests: Recent Labs  Lab  05/09/23 1654 05/10/23 0939  AST 19 24  ALT 9 11  ALKPHOS 80 78  BILITOT 0.7 0.7  PROT 8.2* 8.2*  ALBUMIN 3.9 3.8   Recent Labs  Lab 05/09/23 1654  LIPASE 31   No results for input(s): "AMMONIA" in the last 168 hours. Cardiac Enzymes: No results for input(s): "CKTOTAL", "CKMB", "CKMBINDEX", "TROPONINI" in the last 168 hours. BNP (last 3 results) No results for input(s): "BNP" in the last 8760 hours.  ProBNP (last 3 results) No results for input(s): "PROBNP" in the last 8760 hours.  CBG: No results for input(s): "GLUCAP" in the last 168 hours. No results found for this or any previous visit (from the past 240 hours).   Studies: CT ANGIO HEAD NECK W WO CM Result Date: 05/10/2023 CLINICAL DATA:  Stroke workup EXAM: CT ANGIOGRAPHY HEAD AND NECK WITH AND WITHOUT CONTRAST TECHNIQUE: Multidetector CT imaging of the head and neck was performed using the standard protocol during bolus administration of intravenous contrast. Multiplanar CT image reconstructions and MIPs were obtained to evaluate the vascular anatomy. Carotid stenosis measurements (when applicable) are obtained utilizing NASCET criteria, using the distal internal carotid diameter as the denominator. RADIATION DOSE REDUCTION: This exam was performed according to the departmental dose-optimization program which includes automated exposure control, adjustment of the mA and/or kV according to patient size and/or use of iterative reconstruction technique. CONTRAST:  75mL OMNIPAQUE IOHEXOL 350 MG/ML SOLN COMPARISON:  Brain MRI from earlier today FINDINGS: CT HEAD FINDINGS Brain: Acute infarcts by prior brain MRI are underestimated. No evidence of progression. No hemorrhage, hydrocephalus, or shift. Mild for age cerebral volume loss Vascular: See below Skull: No acute finding Sinuses/Orbits: No acute finding Review of the MIP images confirms the above findings CTA NECK FINDINGS Aortic arch: Atheromatous plaque which is extensive. Three  vessel branching. No dissection or stenosis. Right carotid system: Diffuse atheromatous wall thickening with mixed density plaque at the bifurcation causing a short segment string sign over a 1 cm length, near the angle of the mandible. No ulceration or beading. Left carotid system: Atheromatous wall thickening of the common carotid with mixed density plaque at the bifurcation. No flow reducing stenosis, ulceration, or beading. Vertebral arteries: No proximal subclavian stenosis. The left vertebral artery is strongly dominant and widely patent. Diminutive and relatively faintly enhancing right vertebral artery proximally but patent to the dura without visible beading or dissection. Skeleton: Generalized degenerative spurring without acute finding. Other neck: No acute finding Upper chest: Biapical subpleural fibrotic appearance. Review of the MIP images confirms the above findings CTA HEAD FINDINGS Anterior circulation: No major branch occlusion, beading, or proximal flow reducing stenosis. There is bilateral atheromatous calcification of the cavernous carotids. On thick MIPS, there is less density in the right MCA distribution diffusely. Negative for aneurysm. Posterior circulation: Left dominant vertebral artery. The diminutive right vertebral artery is patent with filling PICA. Atheromatous irregularity of the  posterior cerebral arteries with moderate left P1 and advanced right P1/P2 stenoses. Venous sinuses: Unremarkable Review of the MIP images confirms the above findings IMPRESSION: 1. Severe atheromatous stenosis at the proximal right ICA with short segment string sign and intracranial relative underfilling. 2. Advanced stenosis in the right more than left proximal PCA. Electronically Signed   By: Tiburcio Pea M.D.   On: 05/10/2023 05:10   MR Brain W and Wo Contrast Result Date: 05/10/2023 CLINICAL DATA:  Acute neurologic deficit EXAM: MRI HEAD WITHOUT AND WITH CONTRAST MRI CERVICAL SPINE WITHOUT AND  WITH CONTRAST TECHNIQUE: Multiplanar, multiecho pulse sequences of the brain and surrounding structures, and cervical spine, to include the craniocervical junction and cervicothoracic junction, were obtained without and with intravenous contrast. CONTRAST:  7mL GADAVIST GADOBUTROL 1 MMOL/ML IV SOLN COMPARISON:  None Available. FINDINGS: MRI HEAD FINDINGS Brain: Multifocal abnormal diffusion restriction within the right MCA and PCA territories, including the right precentral gyrus. Fewer than 5 scattered chronic microhemorrhages in a nonspecific pattern. There is multifocal hyperintense T2-weighted signal within the white matter. Parenchymal volume and CSF spaces are normal. Old left cerebellar infarct. The midline structures are normal. There is no abnormal contrast enhancement. Vascular: Attenuated right ICA flow void. Skull and upper cervical spine: Normal calvarium and skull base. Visualized upper cervical spine and soft tissues are normal. Sinuses/Orbits:No paranasal sinus fluid levels or advanced mucosal thickening. No mastoid or middle ear effusion. Normal orbits. MRI CERVICAL SPINE FINDINGS Alignment: Physiologic. Vertebrae: No fracture, evidence of discitis, or bone lesion. Cord: Normal signal and morphology. Posterior Fossa, vertebral arteries, paraspinal tissues: Abnormal proximal right vertebral artery V2 segment flow void. Disc levels: C1-2: Unremarkable. C2-3: Normal disc. Mild facet hypertrophy. There is no spinal canal stenosis. No neural foraminal stenosis. C3-4: Right uncovertebral hypertrophy. There is no spinal canal stenosis. Moderate right neural foraminal stenosis. C4-5: Right foraminal disc osteophyte complex. Mild spinal canal stenosis. Severe right neural foraminal stenosis. C5-6: Bilateral uncovertebral hypertrophy. There is no spinal canal stenosis. Moderate right and severe left neural foraminal stenosis. C6-7: Left uncovertebral spurring. There is no spinal canal stenosis. Severe left  neural foraminal stenosis. C7-T1: Disc space narrowing without herniation. There is no spinal canal stenosis. No neural foraminal stenosis. IMPRESSION: 1. Multifocal acute infarcts within the right MCA and PCA territories, including the right precentral gyrus. 2. Attenuated right ICA flow void at the skull base, likely stenosis. 3. Abnormal right vertebral artery V2 segment flow void, consistent with slow flow or occlusion. The distal right vertebral artery flow void is maintained. 4. Mild spinal canal stenosis and severe right neural foraminal stenosis at C4-5. 5. Moderate right and severe left C5-6 and C6-7 neural foraminal stenosis. Electronically Signed   By: Deatra Robinson M.D.   On: 05/10/2023 00:47   MR Cervical Spine W and Wo Contrast Result Date: 05/10/2023 CLINICAL DATA:  Acute neurologic deficit EXAM: MRI HEAD WITHOUT AND WITH CONTRAST MRI CERVICAL SPINE WITHOUT AND WITH CONTRAST TECHNIQUE: Multiplanar, multiecho pulse sequences of the brain and surrounding structures, and cervical spine, to include the craniocervical junction and cervicothoracic junction, were obtained without and with intravenous contrast. CONTRAST:  7mL GADAVIST GADOBUTROL 1 MMOL/ML IV SOLN COMPARISON:  None Available. FINDINGS: MRI HEAD FINDINGS Brain: Multifocal abnormal diffusion restriction within the right MCA and PCA territories, including the right precentral gyrus. Fewer than 5 scattered chronic microhemorrhages in a nonspecific pattern. There is multifocal hyperintense T2-weighted signal within the white matter. Parenchymal volume and CSF spaces are normal. Old left cerebellar infarct.  The midline structures are normal. There is no abnormal contrast enhancement. Vascular: Attenuated right ICA flow void. Skull and upper cervical spine: Normal calvarium and skull base. Visualized upper cervical spine and soft tissues are normal. Sinuses/Orbits:No paranasal sinus fluid levels or advanced mucosal thickening. No mastoid or middle  ear effusion. Normal orbits. MRI CERVICAL SPINE FINDINGS Alignment: Physiologic. Vertebrae: No fracture, evidence of discitis, or bone lesion. Cord: Normal signal and morphology. Posterior Fossa, vertebral arteries, paraspinal tissues: Abnormal proximal right vertebral artery V2 segment flow void. Disc levels: C1-2: Unremarkable. C2-3: Normal disc. Mild facet hypertrophy. There is no spinal canal stenosis. No neural foraminal stenosis. C3-4: Right uncovertebral hypertrophy. There is no spinal canal stenosis. Moderate right neural foraminal stenosis. C4-5: Right foraminal disc osteophyte complex. Mild spinal canal stenosis. Severe right neural foraminal stenosis. C5-6: Bilateral uncovertebral hypertrophy. There is no spinal canal stenosis. Moderate right and severe left neural foraminal stenosis. C6-7: Left uncovertebral spurring. There is no spinal canal stenosis. Severe left neural foraminal stenosis. C7-T1: Disc space narrowing without herniation. There is no spinal canal stenosis. No neural foraminal stenosis. IMPRESSION: 1. Multifocal acute infarcts within the right MCA and PCA territories, including the right precentral gyrus. 2. Attenuated right ICA flow void at the skull base, likely stenosis. 3. Abnormal right vertebral artery V2 segment flow void, consistent with slow flow or occlusion. The distal right vertebral artery flow void is maintained. 4. Mild spinal canal stenosis and severe right neural foraminal stenosis at C4-5. 5. Moderate right and severe left C5-6 and C6-7 neural foraminal stenosis. Electronically Signed   By: Deatra Robinson M.D.   On: 05/10/2023 00:47   CT HEAD WO CONTRAST ( ) Result Date: 05/09/2023 CLINICAL DATA:  Altered level of consciousness, intermittent left arm numbness for 1 month, left facial numbness since yesterday EXAM: CT HEAD WITHOUT CONTRAST TECHNIQUE: Contiguous axial images were obtained from the base of the skull through the vertex without intravenous contrast.  RADIATION DOSE REDUCTION: This exam was performed according to the departmental dose-optimization program which includes automated exposure control, adjustment of the mA and/or kV according to patient size and/or use of iterative reconstruction technique. COMPARISON:  09/12/2018 FINDINGS: Brain: There is diffuse cerebral atrophy, likely appropriate for age. Extensive hypodensities are seen throughout the periventricular white matter compatible with chronic small vessel ischemic changes. No evidence of acute infarct or hemorrhage. Lateral ventricles and midline structures are unremarkable. No acute extra-axial fluid collections. No mass effect. Vascular: No hyperdense vessel or unexpected calcification. Skull: Normal. Negative for fracture or focal lesion. Sinuses/Orbits: No acute finding. Other: None. IMPRESSION: 1. Chronic small-vessel ischemic changes and diffuse cerebral atrophy. 2. No acute intracranial process. Electronically Signed   By: Sharlet Salina M.D.   On: 05/09/2023 17:23      Joycelyn Das, MD  [facility] 05/11/2023  If 7PM-7AM, please contact night-coverage

## 2023-05-11 NOTE — Progress Notes (Signed)
 Patient seen briefly.  Working with therapy.  Plan for right sided TCAR Friday.

## 2023-05-11 NOTE — Progress Notes (Addendum)
 STROKE TEAM PROGRESS NOTE   INTERIM HISTORY/SUBJECTIVE Family at the bedside Patient sitting up in the bed in no apparent distress.  No acute events overnight States symptoms are about the same  OBJECTIVE  CBC    Component Value Date/Time   WBC 7.1 05/11/2023 0506   RBC 3.86 (L) 05/11/2023 0506   HGB 11.0 (L) 05/11/2023 0506   HCT 34.8 (L) 05/11/2023 0506   PLT 239 05/11/2023 0506   MCV 90.2 05/11/2023 0506   MCH 28.5 05/11/2023 0506   MCHC 31.6 05/11/2023 0506   RDW 14.3 05/11/2023 0506   LYMPHSABS 1.0 05/09/2023 1654   MONOABS 0.6 05/09/2023 1654   EOSABS 0.0 05/09/2023 1654   BASOSABS 0.0 05/09/2023 1654    BMET    Component Value Date/Time   NA 136 05/11/2023 0506   K 4.1 05/11/2023 0506   CL 103 05/11/2023 0506   CO2 25 05/11/2023 0506   GLUCOSE 114 (H) 05/11/2023 0506   BUN 12 05/11/2023 0506   CREATININE 0.93 05/11/2023 0506   CALCIUM 9.1 05/11/2023 0506   GFRNONAA >60 05/11/2023 0506    IMAGING past 24 hours ECHOCARDIOGRAM COMPLETE Result Date: 05/11/2023    ECHOCARDIOGRAM REPORT   Patient Name:   Stephen Meza Date of Exam: 05/11/2023 Medical Rec #:  [id number]            Height:       72.0 in Accession #:    [id number]           Weight:       165.1 lb Date of Birth:  December 02, 1933            BSA:          1.964 m Patient Age:    89 years             BP:           170/72 mmHg Patient Gender: M                    HR:           71 bpm. Exam Location:  Inpatient Procedure: 2D Echo, Cardiac Doppler and Color Doppler (Both Spectral and Color            Flow Doppler were utilized during procedure). Indications:    Stroke  History:        Patient has no prior history of Echocardiogram examinations.                 Stroke; Risk Factors:Hypertension and Former Smoker.  Sonographer:    Lamont Snowball Referring Phys: [phone] Eduard Clos  Sonographer Comments: Technically difficult study due to poor echo windows. Image acquisition challenging due to respiratory motion.  IMPRESSIONS  1. Left ventricular ejection fraction, by estimation, is 55 to 60%. The left ventricle has normal function. The left ventricle has no regional wall motion abnormalities. There is mild concentric left ventricular hypertrophy. Left ventricular diastolic parameters are consistent with Grade I diastolic dysfunction (impaired relaxation).  2. Right ventricular systolic function is normal. The right ventricular size is mildly enlarged.  3. The mitral valve is normal in structure. Mild mitral valve regurgitation. No evidence of mitral stenosis.  4. The aortic valve is tricuspid. Aortic valve regurgitation is not visualized. Aortic valve sclerosis/calcification is present, without any evidence of aortic stenosis.  5. Aortic dilatation noted. There is mild dilatation of the aortic root, measuring 42 mm. There is mild dilatation of the  ascending aorta, measuring 41 mm.  6. The inferior vena cava is normal in size with greater than 50% respiratory variability, suggesting right atrial pressure of 3 mmHg. Comparison(s): No prior Echocardiogram. FINDINGS  Left Ventricle: Left ventricular ejection fraction, by estimation, is 55 to 60%. The left ventricle has normal function. The left ventricle has no regional wall motion abnormalities. The left ventricular internal cavity size was normal in size. There is  mild concentric left ventricular hypertrophy. Left ventricular diastolic parameters are consistent with Grade I diastolic dysfunction (impaired relaxation). Right Ventricle: The right ventricular size is mildly enlarged. Right ventricular systolic function is normal. Left Atrium: Left atrial size was normal in size. Right Atrium: Right atrial size was normal in size. Pericardium: There is no evidence of pericardial effusion. Mitral Valve: The mitral valve is normal in structure. Mild mitral annular calcification. Mild mitral valve regurgitation. No evidence of mitral valve stenosis. MV peak gradient, 6.0 mmHg. The  mean mitral valve gradient is 2.0 mmHg. Tricuspid Valve: The tricuspid valve is normal in structure. Tricuspid valve regurgitation is mild . No evidence of tricuspid stenosis. Aortic Valve: The aortic valve is tricuspid. Aortic valve regurgitation is not visualized. Aortic valve sclerosis/calcification is present, without any evidence of aortic stenosis. Aortic valve mean gradient measures 6.0 mmHg. Aortic valve peak gradient measures 8.5 mmHg. Aortic valve area, by VTI measures 3.06 cm. Pulmonic Valve: The pulmonic valve was normal in structure. Pulmonic valve regurgitation is trivial. No evidence of pulmonic stenosis. Aorta: Aortic dilatation noted. There is mild dilatation of the aortic root, measuring 42 mm. There is mild dilatation of the ascending aorta, measuring 41 mm. Venous: The inferior vena cava is normal in size with greater than 50% respiratory variability, suggesting right atrial pressure of 3 mmHg. IAS/Shunts: No atrial level shunt detected by color flow Doppler.  LEFT VENTRICLE PLAX 2D LVIDd:         2.80 cm   Diastology LVIDs:         2.30 cm   LV e' medial:    6.96 cm/s LV PW:         1.55 cm   LV E/e' medial:  9.9 LV IVS:        1.55 cm   LV e' lateral:   7.40 cm/s LVOT diam:     2.30 cm   LV E/e' lateral: 9.3 LV SV:         83 LV SV Index:   42 LVOT Area:     4.15 cm  RIGHT VENTRICLE             IVC RV Basal diam:  4.60 cm     IVC diam: 2.20 cm RV S prime:     12.30 cm/s TAPSE (M-mode): 1.8 cm LEFT ATRIUM             Index        RIGHT ATRIUM           Index LA Vol (A2C):   65.1 ml 33.15 ml/m  RA Area:     21.30 cm LA Vol (A4C):   49.3 ml 25.10 ml/m  RA Volume:   57.20 ml  29.13 ml/m LA Biplane Vol: 60.7 ml 30.91 ml/m  AORTIC VALVE AV Area (Vmax):    3.13 cm AV Area (Vmean):   2.91 cm AV Area (VTI):     3.06 cm AV Vmax:           146.00 cm/s AV Vmean:  112.000 cm/s AV VTI:            0.270 m AV Peak Grad:      8.5 mmHg AV Mean Grad:      6.0 mmHg LVOT Vmax:         110.00 cm/s  LVOT Vmean:        78.500 cm/s LVOT VTI:          0.199 m LVOT/AV VTI ratio: 0.74  AORTA Ao Root diam: 4.20 cm Ao Asc diam:  4.10 cm MITRAL VALVE                TRICUSPID VALVE MV Area (PHT): 2.13 cm     TR Peak grad:   28.7 mmHg MV Area VTI:   2.92 cm     TR Vmax:        268.00 cm/s MV Peak grad:  6.0 mmHg MV Mean grad:  2.0 mmHg     SHUNTS MV Vmax:       1.22 m/s     Systemic VTI:  0.20 m MV Vmean:      68.4 cm/s    Systemic Diam: 2.30 cm MV Decel Time: 356 msec MV E velocity: 68.65 cm/s MV A velocity: 104.50 cm/s MV E/A ratio:  0.66 Olga Millers MD Electronically signed by Olga Millers MD Signature Date/Time: 05/11/2023/1:16:49 PM    Final     Vitals:   05/11/23 1115 05/11/23 1200 05/11/23 1310 05/11/23 1400  BP:   (!) 147/87   Pulse:      Resp: 15 (!) 21 (!) 22 17  Temp:   99.2 F (37.3 C)   TempSrc:   Oral   SpO2:   97%   Weight:      Height:         PHYSICAL EXAM General:  Alert, well-nourished, well-developed patient in no acute distress Psych:  Mood and affect appropriate for situation CV: Regular rate and rhythm on monitor Respiratory:  Regular, unlabored respirations on room air GI: Abdomen soft and nontender   NEURO:  Mental Status: AA&Ox3, patient is able to give clear and coherent history Speech/Language: speech is without dysarthria or aphasia.  Naming, repetition, fluency, and comprehension intact.  Cranial Nerves:  II: PERRL. Visual fields full.  III, IV, VI: EOMI. Eyelids elevate symmetrically.  V: Sensation is intact to light touch and symmetrical to face.  VII: Slight left facial droop VIII: hearing intact to voice. IX, X: Palate elevates symmetrically. Phonation is normal.  WU:[id number] shrug 5/5. XII: tongue is midline without fasciculations. Motor: Right arm and leg 5/5, left arm and leg 4/5 with drift, weak  dorsi flexion on the left foot Tone: is normal and bulk is normal Sensation- Intact to light touch bilaterally. Extinction absent to light touch  to DSS.   Coordination: Ataxia of left arm and leg Gait- deferred  Most Recent NIH  1a Level of Conscious.: 0 1b LOC Questions: 0 1c LOC Commands: 0 2 Best Gaze: 0 3 Visual: 0 4 Facial Palsy: 1 5a Motor Arm - left: 1 5b Motor Arm - Right: 0 6a Motor Leg - Left: 1 6b Motor Leg - Right: 0 7 Limb Ataxia: 2 8 Sensory: 0 9 Best Language: 0 10 Dysarthria: 0 11 Extinct. and Inatten.: 0 TOTAL: 5   ASSESSMENT/PLAN  Stephen Meza is a 89 y.o. male with history of hypertension, gout, prior history of bladder cancer, lung nodule scheduled for lung biopsy today, B12 deficiency neuropathy presents to  the ER because of numbness involving his left face left upper and lower extremity ongoing for last few weeks.  Dems have gotten worse since Saturday.  NIH on Admission 3  Stroke:  right MCA infarcts likely due to Large vessel disease-right ICA high-grade stenosis CT head No acute abnormality. Small vessel disease. Atrophy.  CTA head & neck Severe atheromatous stenosis at the proximal right ICA with short segment string sign and intracranial relative underfilling. MRI right scattered MCA infarcts, more consistent with watershed fusion 2D Echo EF 55 to 60%.  Mild concentric LVH with grade 1 diastolic dysfunction LDL 95 HgbA1c 5.5 VTE prophylaxis -Lovenox No antithrombotic prior to admission, now on aspirin 81 mg daily and clopidogrel 75 mg daily. Future regimen per [facility] Will need outpatient follow-up with neurology in 4-6 weeks after discharge Therapy recommendations: CIR Disposition: Pending  Right ICA high-grade stenosis CTA Severe atheromatous stenosis at the proximal right ICA with short segment string sign and intracranial relative underfilling Vascular surgery consulted On DAPT Scheduled for OR on Friday for right TCAR  Hypertension Home meds: Amlodipine 2.5 mg Stable Avoid low BP BP goal 130-160 before carotid intervention Long-term BP goal  normotensive  Hyperlipidemia Home meds: None LDL 95, goal < 70 Add atorvastatin 40 mg Continue statin at discharge  Other Stroke Risk Factors Advanced age Former smoker Former alcohol abuse  Other Active Problems Lung nodule pending biopsy Gout 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  Please call with questions or concerns  Hospital day # 1  Gevena Mart DNP, ACNPC-AG  [facility]  ATTENDING NOTE: I reviewed above note and agree with the assessment and plan. Pt was seen and examined.   Wife and two daughters are at the bedside. Pt AAO x 3, reclining in bed, still has left UE pronator drift and left ataxia. On DAPT and statin. Pending right TCAR on Friday. PT and OT recommend HH.   For detailed assessment and plan, please refer to above/below as I have made changes wherever appropriate.   Neurology will sign off. Please call with questions. Pt will follow up with stroke clinic NP at [facility] in about 4-6 weeks. Thanks for the consult.   Marvel Plan, MD PhD Stroke Neurology 05/11/2023 4:24 PM     To contact Stroke Continuity provider, please refer to [email or web address]. After hours, contact General Neurology

## 2023-05-11 NOTE — Progress Notes (Signed)
    Durable Medical Equipment  (From admission, onward)           Start     Ordered   05/11/23 1530  For home use only DME Bedside commode  Once       Question:  Patient needs a bedside commode to treat with the following condition  Answer:  Stroke [facility])   05/11/23 1529   05/11/23 1529  For home use only DME Walker rolling  Once       Question Answer Comment  Walker: With 5 Inch Wheels   Patient needs a walker to treat with the following condition Stroke (HCC)      05/11/23 1529            The patient is confined to one level of the home environment and there is no toilet on the that level of the home.

## 2023-05-11 NOTE — Progress Notes (Addendum)
 Physical Therapy Treatment Patient Details Name: Stephen Meza MRN: [id number] DOB: 08/03/1933 Today's Date: 05/11/2023   History of Present Illness Pt is an 89 y/o M admitted on 05/09/23 after presenting to the ED with c/o numbness to L face & LUE/LLE x the last few weeks, with worsening ability to walk starting on Saturday (3 days prior).  MRI showed "multifocal acute infarcts within the right MCA & PCA  territories, including the right precentral gyrus." PMH: HTN, gout, bladder CA, lung nodule, B12 deficiency, neuropathy, PVD, essential tremor    PT Comments  Pt tolerates treatment well, ambulating for increased distances and demonstrating improved stability. Pt with PRN use of railing along with SPC, no losses of balance noted with only unilateral support of SPC during session. PT encourages use of DME at all times at the time of discharge. PT updates recommendations to discharge home with HHPT. Pt will benefit from a shower chair at the time of discharge.   If plan is discharge home, recommend the following: A little help with walking and/or transfers;A little help with bathing/dressing/bathroom;Assistance with cooking/housework;Assist for transportation   Can travel by private vehicle        Equipment Recommendations  Other (comment) (shower chair)    Recommendations for Other Services       Precautions / Restrictions Precautions Precautions: Fall Recall of Precautions/Restrictions: Intact Restrictions Weight Bearing Restrictions Per Provider Order: No     Mobility  Bed Mobility Overal bed mobility: Modified Independent                  Transfers Overall transfer level: Needs assistance Equipment used: Straight cane Transfers: Sit to/from Stand Sit to Stand: Supervision                Ambulation/Gait Ambulation/Gait assistance: Contact guard assist Gait Distance (Feet): 200 Feet Assistive device: Straight cane (PRN use of railing for support) Gait  Pattern/deviations: Step-through pattern, Wide base of support Gait velocity: reduced Gait velocity interpretation: <1.8 ft/sec, indicate of risk for recurrent falls   General Gait Details: pt with slowed step-through gait, able to change stride length to step over objects without loss of balance but does adjust cadence. No instability with turns   Stairs Stairs: Yes Stairs assistance: Contact guard assist Stair Management: One rail Right, Step to pattern, Forwards, With cane Number of Stairs: 2     Wheelchair Mobility     Tilt Bed    Modified Rankin (Stroke Patients Only) Modified Rankin (Stroke Patients Only) Pre-Morbid Rankin Score: No significant disability Modified Rankin: Moderately severe disability     Balance Overall balance assessment: Needs assistance Sitting-balance support: No upper extremity supported, Feet supported Sitting balance-Leahy Scale: Good     Standing balance support: Single extremity supported, Reliant on assistive device for balance Standing balance-Leahy Scale: Poor                              Communication Communication Communication: No apparent difficulties  Cognition Arousal: Alert Behavior During Therapy: WFL for tasks assessed/performed   PT - Cognitive impairments: No apparent impairments                         Following commands: Intact      Cueing Cueing Techniques: Verbal cues  Exercises      General Comments General comments (skin integrity, edema, etc.): VSS on RA      Pertinent Vitals/Pain  Pain Assessment Pain Assessment: No/denies pain    Home Living                          Prior Function            PT Goals (current goals can now be found in the care plan section) Acute Rehab PT Goals Patient Stated Goal: get better Progress towards PT goals: Progressing toward goals    Frequency    Min 3X/week      PT Plan      Co-evaluation              AM-PAC PT  "6 Clicks" Mobility   Outcome Measure  Help needed turning from your back to your side while in a flat bed without using bedrails?: None Help needed moving from lying on your back to sitting on the side of a flat bed without using bedrails?: None Help needed moving to and from a bed to a chair (including a wheelchair)?: A Little Help needed standing up from a chair using your arms (e.g., wheelchair or bedside chair)?: A Little Help needed to walk in hospital room?: A Little Help needed climbing 3-5 steps with a railing? : A Little 6 Click Score: 20    End of Session Equipment Utilized During Treatment: Gait belt Activity Tolerance: Patient tolerated treatment well Patient left: in bed;with call bell/phone within reach;with family/visitor present Nurse Communication: Mobility status PT Visit Diagnosis: Unsteadiness on feet (R26.81);Hemiplegia and hemiparesis;Muscle weakness (generalized) (M62.81);Other abnormalities of gait and mobility (R26.89);Difficulty in walking, not elsewhere classified (R26.2) Hemiplegia - Right/Left: Left Hemiplegia - dominant/non-dominant: Non-dominant Hemiplegia - caused by: Cerebral infarction     Time: 1055-1110 PT Time Calculation (min) (ACUTE ONLY): 15 min  Charges:    $Gait Training: 8-22 mins PT General Charges $$ ACUTE PT VISIT: 1 Visit                    Arlyss Gandy, PT, DPT Acute Rehabilitation Office [phone]    Arlyss Gandy 05/11/2023, 11:26 AM

## 2023-05-11 NOTE — TOC Initial Note (Signed)
 Transition of Care (TOC) - Initial/Assessment Note    Patient Details  Name: Stephen Meza MRN: [id number] Date of Birth: 04-17-1933  Transition of Care [facility]) CM/SW Contact:    Elberta Fortis, RN Phone Number: 05/11/2023, 3:13 PM  Clinical Narrative:   Met with pt, his spouse, and 2 daughters. Pt lives at home with his spouse whose able to help him as needed. Daughters live nearby and will also be available to stay with pt as needed. He will be transported home in the car with family. Wife usually takes him to his appointments, pt only drives short distances. He's able to take his medications with wife's assistance in loading his pill box. Home Health Care for PT/OT is recommended, list provided to pt to review and will follow up tomorrow. TOC to continue to follow.            Expected Discharge Plan: Home w Home Health Services Barriers to Discharge: Continued Medical Work up   Patient Goals and CMS Choice   CMS Medicare.gov Compare Post Acute Care list provided to:: Patient (Gave list of home health care agencies in [place].) Choice offered to / list presented to : Patient, Spouse      Expected Discharge Plan and Services   Discharge Planning Services: CM Consult Post Acute Care Choice: Home Health Living arrangements for the past 2 months: Single Family Home                                      Prior Living Arrangements/Services Living arrangements for the past 2 months: Single Family Home Lives with:: Significant Other Patient language and need for interpreter reviewed:: Yes Do you feel safe going back to the place where you live?: Yes      Need for Family Participation in Patient Care: Yes (Comment) Care giver support system in place?: Yes (comment) (Has a spouse and 2 suportive daughters) Current home services: DME (cane) Criminal Activity/Legal Involvement Pertinent to Current Situation/Hospitalization: No - Comment as needed  Activities of  Daily Living   ADL Screening (condition at time of admission) Independently performs ADLs?: No Does the patient have a NEW difficulty with bathing/dressing/toileting/self-feeding that is expected to last >3 days?: No (needs assist) Does the patient have a NEW difficulty with getting in/out of bed, walking, or climbing stairs that is expected to last >3 days?: No (needs assist up with cane) Does the patient have a NEW difficulty with communication that is expected to last >3 days?: No Is the patient deaf or have difficulty hearing?: Yes Does the patient have difficulty seeing, even when wearing glasses/contacts?: No Does the patient have difficulty concentrating, remembering, or making decisions?: No  Permission Sought/Granted                  Emotional Assessment Appearance:: Appears stated age, Developmentally appropriate   Affect (typically observed): Accepting, Calm, Hopeful, Appropriate, Pleasant Orientation: : Oriented to Self, Oriented to Place, Oriented to  Time, Oriented to Situation Alcohol / Substance Use: Never Used Psych Involvement: No (comment)  Admission diagnosis:  Numbness [R20.0] Paresthesia [R20.2] Stroke [facility]) [I63.9] Acute CVA (cerebrovascular accident) [facility]) [I63.9] Cerebrovascular accident (CVA), unspecified mechanism (HCC) [I63.9] Patient Active Problem List   Diagnosis Date Noted   Acute CVA (cerebrovascular accident) (HCC) 05/10/2023   History of gout 05/10/2023   Stroke (HCC) 05/10/2023   Abnormal breath sounds 03/29/2023   Lung mass  03/22/2023   Irregular heartbeat 11/10/2022   Anemia 05/03/2021   Tick bite, infected 08/07/2020   Peripheral neuropathy 04/06/2019   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   Low grade fever 04/06/2019   Bilateral leg pain 04/04/2018   Bilateral bunions 04/04/2018   Ganglion cyst of dorsum of right wrist 04/04/2018   Chronic low back pain 04/04/2018   Gait disorder 04/04/2018   Recurrent  falls while walking 04/04/2018   Abrasion of knee, bilateral 04/04/2018   Bladder cancer (HCC) 03/19/2016   Esophageal stricture 03/19/2016   Retinal tear of right eye 03/19/2016   Dizziness 03/19/2016   Hyperglycemia 03/16/2015   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   Prostate cancer (HCC) 04/13/2012   Hyperlipidemia with target LDL less than 130 04/12/2012   Encounter for well adult exam with abnormal findings 03/11/2011   TOBACCO ABUSE, HX OF 02/12/2010   SHOULDER PAIN 10/10/2007   Vitamin B12 deficiency 07/14/2007   PODAGRA 07/14/2007   ERECTILE DYSFUNCTION 07/14/2007   TREMOR, ESSENTIAL 07/14/2007   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 [place] [address] Phone: [phone] Fax: [phone]  [facility] [address], [place] - [address] [facility] [place] [address] Phone: [phone] Fax: [phone]     Social Drivers of Health (SDOH) Social History: SDOH Screenings   Food Insecurity: No Food Insecurity (05/10/2023)  Housing: Low Risk  (05/10/2023)  Transportation Needs: No Transportation Needs (05/10/2023)  Utilities: Not At Risk (05/10/2023)  Alcohol Screen: Low Risk  (08/04/2022)  Depression (PHQ2-9): Low Risk  (03/19/2023)  Financial Resource Strain: Low Risk  (08/04/2022)  Physical Activity: Sufficiently Active (08/04/2022)  Social Connections: Socially Integrated (05/10/2023)  Stress: No Stress Concern Present (08/04/2022)  Tobacco Use: Medium Risk (05/10/2023)   SDOH Interventions:     Readmission Risk Interventions     No data to display

## 2023-05-11 NOTE — Progress Notes (Signed)
 OT Cancellation Note  Patient Details Name: Stephen Meza MRN: [id number] DOB: May 03, 1933   Cancelled Treatment:    Reason Eval/Treat Not Completed: Patient at procedure or test/ unavailable (echo). Will follow up later time.   Prakriti Carignan,HILLARY 05/11/2023, 11:28 AM Luisa Dago, OT/L   Acute OT Clinical Specialist Acute Rehabilitation Services Pager [phone] Office [phone]

## 2023-05-11 NOTE — Evaluation (Signed)
 Occupational Therapy Evaluation Patient Details Name: Stephen Meza MRN: [id number] DOB: 01-13-1934 Today's Date: 05/11/2023   History of Present Illness   Pt is an 89 y/o M admitted on 05/09/23 after presenting to the ED with c/o numbness to L face & LUE/LLE x the last few weeks, with worsening ability to walk starting on Saturday (3 days prior).  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 At baseline, pt uses a [employer] intermittently due to his peripheral neuropathy. Currently requires CGA with mobility and ADL tasks @ RW level due to below listed deficits. Feel pt would benefit from [facility], however recommend direct S with mobility and ADL tasks, which his daughters state they can provide. Acute OT to follow.      If plan is discharge home, recommend the following:   A little help with walking and/or transfers;A little help with bathing/dressing/bathroom;Assistance with cooking/housework;Direct supervision/assist for medications management;Direct supervision/assist for financial management;Assist for transportation;Help with stairs or ramp for entrance     Functional Status Assessment   Patient has had a recent decline in their functional status and demonstrates the ability to make significant improvements in function in a reasonable and predictable amount of time.     Equipment Recommendations   BSC/3in1;Other (comment) (RW)     Recommendations for Other Services         Precautions/Restrictions   Precautions Precautions: Fall Recall of Precautions/Restrictions: Intact Restrictions Weight Bearing Restrictions Per Provider Order: No     Mobility Bed Mobility Overal bed mobility: Modified Independent                   Transfers Overall transfer level: Needs assistance Equipment used: Straight cane Transfers: Sit to/from Stand Sit to Stand: Supervision                  Balance Overall balance assessment: Needs assistance Sitting-balance support: No upper extremity supported, Feet supported Sitting balance-Leahy Scale: Good     Standing balance support: Single extremity supported, Reliant on assistive device for balance Standing balance-Leahy Scale: Poor Standing balance comment: reliant on external support                           ADL either performed or assessed with clinical judgement   ADL Overall ADL's : Needs assistance/impaired Eating/Feeding: Set up Eating/Feeding Details (indicate cue type and reason): difficulty cutting food using L hand; issued red tubing to assist Grooming: Contact guard assist;Standing   Upper Body Bathing: Set up;Sitting   Lower Body Bathing: Contact guard assist;Sit to/from stand   Upper Body Dressing : Sitting;Set up;Supervision/safety   Lower Body Dressing: Contact guard assist;Sit to/from stand   Toilet Transfer: Contact guard assist;Ambulation   Toileting- Clothing Manipulation and Hygiene: Supervision/safety       Functional mobility during ADLs: Contact guard assist;Rolling walker (2 wheels);Cueing for safety  General ADL Comments: educated family on use of a tub bench to avoid stepping over tub; issued gait belt     Vision Baseline Vision/History: 1 Wears glasses Patient Visual Report: No change from baseline Vision Assessment?: Yes Eye Alignment: Within Functional Limits Ocular Range of Motion: Within Functional Limits Alignment/Gaze Preference: Within Defined Limits Tracking/Visual Pursuits: Able to track stimulus in all quads  without difficulty Saccades: Within functional limits Convergence: Within functional limits Visual Fields: No apparent deficits Additional Comments: hits all targets in fields; organized  scanning pattern; read with minimal word substitution; skipped an entire row on letter cncellation however hit all targets with the exception of that line.     Perception Perception: Impaired Preception Impairment Details: Inattention/Neglect (mild L inattention; spatial difficulty with clock draw; good use of feedback)     Praxis Praxis: Impaired Praxis Impairment Details: Limb apraxia     Pertinent Vitals/Pain Pain Assessment Pain Assessment: No/denies pain     Extremity/Trunk Assessment Upper Extremity Assessment Upper Extremity Assessment: Right hand dominant;LUE deficits/detail LUE Deficits / Details: LUE dsymetria, reports numbness & tingling in L arm & hand; AROM WFL; uncoordinated finger to nose; cues to use LUE LUE Coordination: decreased fine motor;decreased gross motor   Lower Extremity Assessment Lower Extremity Assessment: Defer to PT evaluation   Cervical / Trunk Assessment Cervical / Trunk Assessment: Normal   Communication Communication Communication: No apparent difficulties   Cognition Arousal: Alert Behavior During Therapy: WFL for tasks assessed/performed Cognition: Cognition impaired     Awareness: Online awareness impaired                         Following commands: Intact       Cueing  General Comments   Cueing Techniques: Verbal cues  VSS on RA   Exercises     Shoulder Instructions      Home Living Family/patient expects to be discharged to:: Private residence Living Arrangements: Spouse/significant other Available Help at Discharge: Family;Available 24 hours/day Type of Home: House Home Access: Stairs to enter [employer] of Steps: 1 via garage Entrance Stairs-Rails: None Home Layout: Two level;Able to live on main level with bedroom/bathroom Alternate Level Stairs-Number of Steps: Pt's primary bed/bathroom are upstairs but does have bed/bathroom on main level he can use Alternate Level Stairs-Rails:  Left Bathroom Shower/Tub: Tub/shower unit;Walk-in shower (tub shower downstairs, walk in shower upstairs)   Bathroom Toilet: Standard Bathroom Accessibility: Yes How Accessible: Accessible via walker Home Equipment: Cane - single point          Prior Functioning/Environment Prior Level of Function : Independent/Modified Independent             Mobility Comments: ambulatory with SPC without AD, reports he has his license but wife does most of the driving, denies falls      OT Problem List: Decreased strength;Impaired balance (sitting and/or standing);Impaired vision/perception;Decreased coordination;Decreased safety awareness;Decreased knowledge of use of DME or AE;Impaired sensation;Impaired UE functional use   OT Treatment/Interventions: Self-care/ADL training;Therapeutic exercise;Neuromuscular education;DME and/or AE instruction;Therapeutic activities;Visual/perceptual remediation/compensation;Patient/family education;Balance training      OT Goals(Current goals can be found in the care plan section)   Acute Rehab OT Goals Patient Stated Goal: get better and go home OT Goal Formulation: With patient/family Time For Goal Achievement: 05/25/23 Potential to Achieve Goals: Good   OT Frequency:  Min 2X/week    Co-evaluation              AM-PAC OT "6 Clicks" Daily Activity     Outcome Measure Help from another person eating meals?: A Little Help from another person taking care of personal grooming?: A Little Help from another person toileting, which includes using toliet, bedpan, or urinal?: A Little Help from another person bathing (including washing, rinsing, drying)?: A Little Help from another person to put on and taking off regular upper body clothing?: A  Little Help from another person to put on and taking off regular lower body clothing?: A Little 6 Click Score: 18   End of Session Equipment Utilized During Treatment: Gait belt;Rolling walker (2  wheels) Nurse Communication: Mobility status  Activity Tolerance: Patient tolerated treatment well Patient left: in bed;with call bell/phone within reach;with family/visitor present;with bed alarm set  OT Visit Diagnosis: Unsteadiness on feet (R26.81);Other abnormalities of gait and mobility (R26.89);Muscle weakness (generalized) (M62.81);Ataxia, unspecified (R27.0)                Time: 1214-1300 OT Time Calculation (min): 46 min Charges:  OT General Charges $OT Visit: 1 Visit OT Evaluation $OT Eval Moderate Complexity: 1 Mod OT Treatments $Self Care/Home Management : 23-37 mins  Luisa Dago, OT/L   Acute OT Clinical Specialist Acute Rehabilitation Services Pager [phone] Office [phone]   [facility] 05/11/2023, 1:55 PM

## 2023-05-12 DIAGNOSIS — I639 Cerebral infarction, unspecified: Secondary | ICD-10-CM | POA: Diagnosis not present

## 2023-05-12 NOTE — Plan of Care (Signed)
 Problem: Education: Goal: Knowledge of disease or condition will improve 05/12/2023 0401 by Pamala Duffel, RN Outcome: Progressing 05/12/2023 0401 by Pamala Duffel, RN Outcome: Progressing 05/12/2023 0355 by Pamala Duffel, RN Outcome: Progressing Goal: Knowledge of secondary prevention will improve (MUST DOCUMENT ALL) 05/12/2023 0401 by Pamala Duffel, RN Outcome: Progressing 05/12/2023 0401 by Pamala Duffel, RN Outcome: Progressing 05/12/2023 0355 by Pamala Duffel, RN Outcome: Progressing Goal: Knowledge of patient specific risk factors will improve (DELETE if not current risk factor) 05/12/2023 0401 by Pamala Duffel, RN Outcome: Progressing 05/12/2023 0401 by Pamala Duffel, RN Outcome: Progressing 05/12/2023 0355 by Pamala Duffel, RN Outcome: Progressing   Problem: Ischemic Stroke/TIA Tissue Perfusion: Goal: Complications of ischemic stroke/TIA will be minimized 05/12/2023 0401 by Pamala Duffel, RN Outcome: Progressing 05/12/2023 0401 by Pamala Duffel, RN Outcome: Progressing 05/12/2023 0355 by Pamala Duffel, RN Outcome: Progressing   Problem: Coping: Goal: Will verbalize positive feelings about self 05/12/2023 0401 by Pamala Duffel, RN Outcome: Progressing 05/12/2023 0401 by Pamala Duffel, RN Outcome: Progressing 05/12/2023 0355 by Pamala Duffel, RN Outcome: Progressing Goal: Will identify appropriate support needs 05/12/2023 0401 by Pamala Duffel, RN Outcome: Progressing 05/12/2023 0401 by Pamala Duffel, RN Outcome: Progressing 05/12/2023 0355 by Pamala Duffel, RN Outcome: Progressing   Problem: Health Behavior/Discharge Planning: Goal: Ability to manage health-related needs will improve 05/12/2023 0401 by Pamala Duffel, RN Outcome: Progressing 05/12/2023 0401 by Pamala Duffel, RN Outcome: Progressing 05/12/2023 0355 by Pamala Duffel,  RN Outcome: Progressing Goal: Goals will be collaboratively established with patient/family 05/12/2023 0401 by Pamala Duffel, RN Outcome: Progressing 05/12/2023 0401 by Pamala Duffel, RN Outcome: Progressing 05/12/2023 0355 by Pamala Duffel, RN Outcome: Progressing   Problem: Self-Care: Goal: Ability to participate in self-care as condition permits will improve 05/12/2023 0401 by Pamala Duffel, RN Outcome: Progressing 05/12/2023 0401 by Pamala Duffel, RN Outcome: Progressing 05/12/2023 0355 by Pamala Duffel, RN Outcome: Progressing Goal: Verbalization of feelings and concerns over difficulty with self-care will improve 05/12/2023 0401 by Pamala Duffel, RN Outcome: Progressing 05/12/2023 0401 by Pamala Duffel, RN Outcome: Progressing 05/12/2023 0355 by Pamala Duffel, RN Outcome: Progressing Goal: Ability to communicate needs accurately will improve 05/12/2023 0401 by Pamala Duffel, RN Outcome: Progressing 05/12/2023 0401 by Pamala Duffel, RN Outcome: Progressing 05/12/2023 0355 by Pamala Duffel, RN Outcome: Progressing   Problem: Nutrition: Goal: Risk of aspiration will decrease 05/12/2023 0401 by Pamala Duffel, RN Outcome: Progressing 05/12/2023 0401 by Pamala Duffel, RN Outcome: Progressing 05/12/2023 0355 by Pamala Duffel, RN Outcome: Progressing Goal: Dietary intake will improve 05/12/2023 0401 by Pamala Duffel, RN Outcome: Progressing 05/12/2023 0401 by Pamala Duffel, RN Outcome: Progressing 05/12/2023 0355 by Pamala Duffel, RN Outcome: Progressing   Problem: Education: Goal: Knowledge of General Education information will improve Description: Including pain rating scale, medication(s)/side effects and non-pharmacologic comfort measures 05/12/2023 0401 by Pamala Duffel, RN Outcome: Progressing 05/12/2023 0401 by Pamala Duffel, RN Outcome:  Progressing 05/12/2023 0355 by Pamala Duffel, RN Outcome: Progressing   Problem: Health Behavior/Discharge Planning: Goal: Ability to manage health-related needs will improve 05/12/2023 0401 by Pamala Duffel, RN Outcome: Progressing 05/12/2023 0401 by Pamala Duffel, RN Outcome: Progressing 05/12/2023 0355 by Pamala Duffel, RN Outcome: Progressing   Problem: Clinical Measurements: Goal: Ability to maintain clinical measurements within normal limits will improve 05/12/2023  0401 by Pamala Duffel, RN Outcome: Progressing 05/12/2023 0401 by Pamala Duffel, RN Outcome: Progressing 05/12/2023 0355 by Pamala Duffel, RN Outcome: Progressing Goal: Will remain free from infection 05/12/2023 0401 by Pamala Duffel, RN Outcome: Progressing 05/12/2023 0401 by Pamala Duffel, RN Outcome: Progressing 05/12/2023 0355 by Pamala Duffel, RN Outcome: Progressing Goal: Diagnostic test results will improve 05/12/2023 0401 by Pamala Duffel, RN Outcome: Progressing 05/12/2023 0401 by Pamala Duffel, RN Outcome: Progressing 05/12/2023 0355 by Pamala Duffel, RN Outcome: Progressing Goal: Respiratory complications will improve 05/12/2023 0401 by Pamala Duffel, RN Outcome: Progressing 05/12/2023 0401 by Pamala Duffel, RN Outcome: Progressing 05/12/2023 0355 by Pamala Duffel, RN Outcome: Progressing Goal: Cardiovascular complication will be avoided 05/12/2023 0401 by Pamala Duffel, RN Outcome: Progressing 05/12/2023 0401 by Pamala Duffel, RN Outcome: Progressing 05/12/2023 0355 by Pamala Duffel, RN Outcome: Progressing   Problem: Activity: Goal: Risk for activity intolerance will decrease 05/12/2023 0401 by Pamala Duffel, RN Outcome: Progressing 05/12/2023 0401 by Pamala Duffel, RN Outcome: Progressing 05/12/2023 0355 by Pamala Duffel, RN Outcome: Progressing    Problem: Nutrition: Goal: Adequate nutrition will be maintained 05/12/2023 0401 by Pamala Duffel, RN Outcome: Progressing 05/12/2023 0401 by Pamala Duffel, RN Outcome: Progressing 05/12/2023 0355 by Pamala Duffel, RN Outcome: Progressing   Problem: Coping: Goal: Level of anxiety will decrease 05/12/2023 0401 by Pamala Duffel, RN Outcome: Progressing 05/12/2023 0401 by Pamala Duffel, RN Outcome: Progressing 05/12/2023 0355 by Pamala Duffel, RN Outcome: Progressing   Problem: Elimination: Goal: Will not experience complications related to bowel motility 05/12/2023 0401 by Pamala Duffel, RN Outcome: Progressing 05/12/2023 0401 by Pamala Duffel, RN Outcome: Progressing 05/12/2023 0355 by Pamala Duffel, RN Outcome: Progressing Goal: Will not experience complications related to urinary retention 05/12/2023 0401 by Pamala Duffel, RN Outcome: Progressing 05/12/2023 0401 by Pamala Duffel, RN Outcome: Progressing 05/12/2023 0355 by Pamala Duffel, RN Outcome: Progressing   Problem: Pain Managment: Goal: General experience of comfort will improve and/or be controlled 05/12/2023 0401 by Pamala Duffel, RN Outcome: Progressing 05/12/2023 0401 by Pamala Duffel, RN Outcome: Progressing 05/12/2023 0355 by Pamala Duffel, RN Outcome: Progressing   Problem: Safety: Goal: Ability to remain free from injury will improve 05/12/2023 0401 by Pamala Duffel, RN Outcome: Progressing 05/12/2023 0401 by Pamala Duffel, RN Outcome: Progressing 05/12/2023 0355 by Pamala Duffel, RN Outcome: Progressing   Problem: Skin Integrity: Goal: Risk for impaired skin integrity will decrease 05/12/2023 0401 by Pamala Duffel, RN Outcome: Progressing 05/12/2023 0401 by Pamala Duffel, RN Outcome: Progressing 05/12/2023 0355 by Pamala Duffel, RN Outcome: Progressing

## 2023-05-12 NOTE — Progress Notes (Signed)
  Progress Note    05/12/2023 10:28 AM  Subjective:  Still having some numbness and weakness in L hand however this is better than initial presentation.   Vitals:   05/12/23 0355 05/12/23 0722  BP: (!) 148/69 (!) 148/60  Pulse: (!) 58 (!) 57  Resp: 17 16  Temp: 98.2 F (36.8 C) 98.4 F (36.9 C)  SpO2: 97% 97%   Physical Exam: Lungs:  non labored Extremities:  moving all ext well Neurologic: CN grossly intact  CBC    Component Value Date/Time   WBC 7.1 05/11/2023 0506   RBC 3.86 (L) 05/11/2023 0506   HGB 11.0 (L) 05/11/2023 0506   HCT 34.8 (L) 05/11/2023 0506   PLT 239 05/11/2023 0506   MCV 90.2 05/11/2023 0506   MCH 28.5 05/11/2023 0506   MCHC 31.6 05/11/2023 0506   RDW 14.3 05/11/2023 0506   LYMPHSABS 1.0 05/09/2023 1654   MONOABS 0.6 05/09/2023 1654   EOSABS 0.0 05/09/2023 1654   BASOSABS 0.0 05/09/2023 1654    BMET    Component Value Date/Time   NA 136 05/11/2023 0506   K 4.1 05/11/2023 0506   CL 103 05/11/2023 0506   CO2 25 05/11/2023 0506   GLUCOSE 114 (H) 05/11/2023 0506   BUN 12 05/11/2023 0506   CREATININE 0.93 05/11/2023 0506   CALCIUM 9.1 05/11/2023 0506   GFRNONAA >60 05/11/2023 0506   GFRAA 122 09/08/2007 1631    INR    Component Value Date/Time   INR 1.0 05/11/2023 0506    No intake or output data in the 24 hours ending 05/12/23 1028   Assessment/Plan:  89 y.o. male with symptomatic R ICA stenosis  Subjectively doing well overall.  L hand numbness/weakness has improved but not completely back to baseline.  He has not experienced any other stroke like symptoms.  Continue aspirin, plavix, statin daily.  Plan is for R sided TCAR with Dr. Karin Lieu on Friday 3/21   Emilie Rutter, PA-C [facility] [phone] 05/12/2023 10:28 AM

## 2023-05-12 NOTE — Plan of Care (Signed)
  Problem: Education: Goal: Knowledge of disease or condition will improve Outcome: Progressing   Problem: Ischemic Stroke/TIA Tissue Perfusion: Goal: Complications of ischemic stroke/TIA will be minimized Outcome: Progressing   Problem: Coping: Goal: Will verbalize positive feelings about self Outcome: Progressing Goal: Will identify appropriate support needs Outcome: Progressing   Problem: Health Behavior/Discharge Planning: Goal: Ability to manage health-related needs will improve Outcome: Progressing Goal: Goals will be collaboratively established with patient/family Outcome: Progressing   Problem: Nutrition: Goal: Risk of aspiration will decrease Outcome: Progressing Goal: Dietary intake will improve Outcome: Progressing   Problem: Education: Goal: Knowledge of General Education information will improve Description: Including pain rating scale, medication(s)/side effects and non-pharmacologic comfort measures Outcome: Progressing   Problem: Clinical Measurements: Goal: Respiratory complications will improve Outcome: Progressing Goal: Cardiovascular complication will be avoided Outcome: Progressing   Problem: Nutrition: Goal: Adequate nutrition will be maintained Outcome: Progressing   Problem: Coping: Goal: Level of anxiety will decrease Outcome: Progressing   Problem: Elimination: Goal: Will not experience complications related to bowel motility Outcome: Progressing

## 2023-05-12 NOTE — Plan of Care (Signed)

## 2023-05-12 NOTE — Progress Notes (Signed)
 Physical Therapy Treatment Patient Details Name: Stephen Meza MRN: [id number] DOB: 1933/12/02 Today's Date: 05/12/2023   History of Present Illness Pt is an 89 y/o M admitted on 05/09/23 after presenting to the ED with c/o numbness to L face & LUE/LLE x the last few weeks, with worsening ability to walk starting on Saturday (3 days prior).  MRI showed "multifocal acute infarcts within the right MCA & PCA  territories, including the right precentral gyrus." PMH: HTN, gout, bladder CA, lung nodule, B12 deficiency, neuropathy, PVD, essential tremor    PT Comments  Patient making steady progress with mobility, resting in bed at start and agreeable to therapy visit. Mod ind with use of bed features for supine>sit. Supervision for sit<>stand with SPC from EOB, pt amb short distance to sink and able to maintain static standing balance with single UE support on counter to complete oral hygiene and self care (wash face and comb hair).  Pt ambulated hallway distance of ~300' with SPC and overall no overt LOB, slow cautious pace with fair management of SPC placement. Pt performed stair negotiation with single rail and SPC to simulate home set up. No LOB, cues needed for safe step to pattern. EOS pt completed repeated sit<>stand for functional LE strengthening and agreeable to remain OOB in recliner. Alarm on and call bell within reach and family present. Will continue to progress as able in acute stay.    If plan is discharge home, recommend the following: A little help with walking and/or transfers;A little help with bathing/dressing/bathroom;Assistance with cooking/housework;Assist for transportation   Can travel by private vehicle        Equipment Recommendations  Rolling walker (2 wheels)    Recommendations for Other Services Rehab consult     Precautions / Restrictions Precautions Precautions: Fall Recall of Precautions/Restrictions: Intact Restrictions Weight Bearing Restrictions Per  Provider Order: No     Mobility  Bed Mobility Overal bed mobility: Modified Independent             General bed mobility comments: use of bed features    Transfers Overall transfer level: Needs assistance Equipment used: Straight cane Transfers: Sit to/from Stand Sit to Stand: Supervision           General transfer comment: sup for safety, pt using single UE to power up from EOB and SPC in contralateral UE. completed repeated sit<>stands for strengthening from recliner with CGA, no UE use.    Ambulation/Gait Ambulation/Gait assistance: Contact guard assist Gait Distance (Feet): 300 Feet Assistive device: Straight cane Gait Pattern/deviations: Step-through pattern, Wide base of support, Decreased stride length, Drifts right/left Gait velocity: decr     General Gait Details: pt with slow/cautious cadence, no overt LOB though mild unsteadiness, possible trendelenburg gait.   Stairs Stairs: Yes Stairs assistance: Contact guard assist Stair Management: One rail Left, Alternating pattern, Step to pattern, Forwards, With cane Number of Stairs: 5 General stair comments: pt initially with alt step pattern and poor foot placement on step. cue sfor step to pattern and pt improved placement for whole foot on single step. good use of SPC with hand rail for ascend/descend.   Wheelchair Mobility     Tilt Bed    Modified Rankin (Stroke Patients Only)       Balance Overall balance assessment: Needs assistance Sitting-balance support: No upper extremity supported, Feet supported Sitting balance-Leahy Scale: Good     Standing balance support: Single extremity supported, Reliant on assistive device for balance Standing balance-Leahy Scale: Poor Standing  balance comment: reliant on external support                            Communication Communication Communication: No apparent difficulties  Cognition Arousal: Alert Behavior During Therapy: WFL for tasks  assessed/performed   PT - Cognitive impairments: No apparent impairments                       PT - Cognition Comments: pleasant, oriented, and engaging in conversation Following commands: Intact      Cueing Cueing Techniques: Verbal cues  Exercises      General Comments        Pertinent Vitals/Pain Pain Assessment Pain Assessment: No/denies pain    Home Living                          Prior Function            PT Goals (current goals can now be found in the care plan section) Acute Rehab PT Goals Patient Stated Goal: get better PT Goal Formulation: With patient/family Time For Goal Achievement: 05/24/23 Potential to Achieve Goals: Good Progress towards PT goals: Progressing toward goals    Frequency    Min 3X/week      PT Plan      Co-evaluation              AM-PAC PT "6 Clicks" Mobility   Outcome Measure  Help needed turning from your back to your side while in a flat bed without using bedrails?: None Help needed moving from lying on your back to sitting on the side of a flat bed without using bedrails?: None Help needed moving to and from a bed to a chair (including a wheelchair)?: A Little Help needed standing up from a chair using your arms (e.g., wheelchair or bedside chair)?: A Little Help needed to walk in hospital room?: A Little Help needed climbing 3-5 steps with a railing? : A Little 6 Click Score: 20    End of Session Equipment Utilized During Treatment: Gait belt Activity Tolerance: Patient tolerated treatment well Patient left: in chair;with call bell/phone within reach;with chair alarm set;with family/visitor present Nurse Communication: Mobility status PT Visit Diagnosis: Unsteadiness on feet (R26.81);Hemiplegia and hemiparesis;Muscle weakness (generalized) (M62.81);Other abnormalities of gait and mobility (R26.89);Difficulty in walking, not elsewhere classified (R26.2) Hemiplegia - Right/Left: Left Hemiplegia  - dominant/non-dominant: Non-dominant Hemiplegia - caused by: Cerebral infarction     Time: 2956-2130 PT Time Calculation (min) (ACUTE ONLY): 25 min  Charges:    $Gait Training: 8-22 mins $Therapeutic Exercise: 8-22 mins PT General Charges $$ ACUTE PT VISIT: 1 Visit                     Wynn Maudlin, DPT Acute Rehabilitation Services Office [phone]  05/12/23 9:00 AM

## 2023-05-12 NOTE — Progress Notes (Signed)
 PROGRESS NOTE  Stephen Meza WUJ:[id number] DOB: [birth year] DOA: 05/09/2023 PCP: Stephen Cipro, FNP   LOS: 2 days   Brief narrative:  Stephen Meza is a 89 y.o. male with history of hypertension, gout, prior history of bladder cancer, lung nodule scheduled for lung biopsy 05/10/23, B12 deficiency neuropathy presented to the ED with intermittent weakness and numbness of the left side of his body for few weeks.  For last 3 days symptoms were pronounced and patient had some difficulty walking as well.  Labs show hemoglobin of 11.3 EKG shows normal sinus rhythm. MRI of the brain showed  multifocal acute infarct involving the right MCA and PCA territory.  CT angiogram head and neck shows right ICA stenosis with string sign.  Neurology on-call was consulted patient was placed on Plavix, aspirin and patient was admitted for further management of acute stroke.     Assessment/Plan: Principal Problem:   Acute CVA (cerebrovascular accident) [facility] Active Problems:   Vitamin B12 deficiency   Essential hypertension   Anemia   History of gout   Stroke [facility]  Acute ischemic CVA  Significant right ICA stenosis Seen by neurology. Neurology recommends vascular surgery consultation for significant right ICA stenosis.  Continue aspirin statin and plan for TCAR likely on Friday.   Hypertension Permissive hypertension for now.  IV hydralazine for SBP more than 220.  Latest blood pressure of 148/60   Chronic anemia with B12 deficiency.  Continue vitamin B12 supplements.   Latest vitamin B12 of 769, folate of 14.8, iron of 66 and TIBC 287, ferritin of 48.   History of gout on allopurinol.  Will continue   Lung nodule being followed by pulmonologist originally scheduled for biopsy later presentation but currently in the hospital.  Follow-up as outpatient.  DVT prophylaxis: enoxaparin (LOVENOX) injection 40 mg Start: 05/10/23 1000   Disposition: Likely home with home health after surgical  intervention.  Status is: Inpatient Remains inpatient appropriate because: Need for surgical intervention,    Code Status:     Code Status: Full Code  Family Communication: Spoke with the patient's spouse at bedside  Consultants:  neurology Vascular surgery  Procedures: None so far  Anti-infectives:  None  Anti-infectives (From admission, onward)    None       Subjective: Today, patient was seen and examined at bedside.  Seen up on the bedside chair.  Denies any pain, nausea, vomiting, fever, chills or rigor. Mild left-sided weakness.  Objective: Vitals:   05/12/23 0355 05/12/23 0722  BP: (!) 148/69 (!) 148/60  Pulse: (!) 58 (!) 57  Resp: 17 16  Temp: 98.2 F (36.8 C) 98.4 F (36.9 C)  SpO2: 97% 97%   No intake or output data in the 24 hours ending 05/12/23 1006  Filed Weights   05/09/23 1648  Weight: 74.9 kg   Body mass index is 22.39 kg/m.   Physical Exam:  GENERAL: Patient is alert awake and oriented. Not in obvious distress.  Elderly male, Communicative. HENT: No scleral pallor or icterus. Pupils equally reactive to light. Oral mucosa is moist NECK: is supple, no gross swelling noted. CHEST: Clear to auscultation. No crackles or wheezes.   CVS: S1 and S2 heard, no murmur. Regular rate and rhythm.  ABDOMEN: Soft, non-tender, bowel sounds are present. EXTREMITIES: No edema. CNS: Mild left facial droop.  Mild weakness of the left arm and leg. SKIN: warm and dry without rashes.  Data Review: I have personally reviewed the following laboratory data and studies,  CBC: Recent Labs  Lab 05/09/23 1654 05/10/23 0939 05/11/23 0506  WBC 7.3 6.7 7.1  NEUTROABS 5.7  --   --   HGB 11.3* 11.6* 11.0*  HCT 34.8* 37.4* 34.8*  MCV 89.2 91.0 90.2  PLT 239 233 239   Basic Metabolic Panel: Recent Labs  Lab 05/09/23 1654 05/10/23 0939 05/11/23 0506  NA 136 133* 136  K 4.2 3.8 4.1  CL 101 99 103  CO2 25 23 25   GLUCOSE 106* 114* 114*  BUN 15 12 12    CREATININE 0.88 0.80 0.93  CALCIUM 9.3 9.2 9.1  MG  --   --  2.2   Liver Function Tests: Recent Labs  Lab 05/09/23 1654 05/10/23 0939  AST 19 24  ALT 9 11  ALKPHOS 80 78  BILITOT 0.7 0.7  PROT 8.2* 8.2*  ALBUMIN 3.9 3.8   Recent Labs  Lab 05/09/23 1654  LIPASE 31   No results for input(s): "AMMONIA" in the last 168 hours. Cardiac Enzymes: No results for input(s): "CKTOTAL", "CKMB", "CKMBINDEX", "TROPONINI" in the last 168 hours. BNP (last 3 results) No results for input(s): "BNP" in the last 8760 hours.  ProBNP (last 3 results) No results for input(s): "PROBNP" in the last 8760 hours.  CBG: No results for input(s): "GLUCAP" in the last 168 hours. No results found for this or any previous visit (from the past 240 hours).   Studies: ECHOCARDIOGRAM COMPLETE Result Date: 05/11/2023    ECHOCARDIOGRAM REPORT   Patient Name:   Stephen Meza Date of Exam: 05/11/2023 Medical Rec #:  [id number]            Height:       72.0 in Accession #:    [id number]           Weight:       165.1 lb Date of Birth:  1934/02/14            BSA:          1.964 m Patient Age:    89 years             BP:           170/72 mmHg Patient Gender: M                    HR:           71 bpm. Exam Location:  Inpatient Procedure: 2D Echo, Cardiac Doppler and Color Doppler (Both Spectral and Color            Flow Doppler were utilized during procedure). Indications:    Stroke  History:        Patient has no prior history of Echocardiogram examinations.                 Stroke; Risk Factors:Hypertension and Former Smoker.  Sonographer:    Lamont Snowball Referring Phys: [phone] Stephen Meza  Sonographer Comments: Technically difficult study due to poor echo windows. Image acquisition challenging due to respiratory motion. IMPRESSIONS  1. Left ventricular ejection fraction, by estimation, is 55 to 60%. The left ventricle has normal function. The left ventricle has no regional wall motion abnormalities. There is  mild concentric left ventricular hypertrophy. Left ventricular diastolic parameters are consistent with Grade I diastolic dysfunction (impaired relaxation).  2. Right ventricular systolic function is normal. The right ventricular size is mildly enlarged.  3. The mitral valve is normal in structure. Mild mitral valve regurgitation. No evidence of mitral  stenosis.  4. The aortic valve is tricuspid. Aortic valve regurgitation is not visualized. Aortic valve sclerosis/calcification is present, without any evidence of aortic stenosis.  5. Aortic dilatation noted. There is mild dilatation of the aortic root, measuring 42 mm. There is mild dilatation of the ascending aorta, measuring 41 mm.  6. The inferior vena cava is normal in size with greater than 50% respiratory variability, suggesting right atrial pressure of 3 mmHg. Comparison(s): No prior Echocardiogram. FINDINGS  Left Ventricle: Left ventricular ejection fraction, by estimation, is 55 to 60%. The left ventricle has normal function. The left ventricle has no regional wall motion abnormalities. The left ventricular internal cavity size was normal in size. There is  mild concentric left ventricular hypertrophy. Left ventricular diastolic parameters are consistent with Grade I diastolic dysfunction (impaired relaxation). Right Ventricle: The right ventricular size is mildly enlarged. Right ventricular systolic function is normal. Left Atrium: Left atrial size was normal in size. Right Atrium: Right atrial size was normal in size. Pericardium: There is no evidence of pericardial effusion. Mitral Valve: The mitral valve is normal in structure. Mild mitral annular calcification. Mild mitral valve regurgitation. No evidence of mitral valve stenosis. MV peak gradient, 6.0 mmHg. The mean mitral valve gradient is 2.0 mmHg. Tricuspid Valve: The tricuspid valve is normal in structure. Tricuspid valve regurgitation is mild . No evidence of tricuspid stenosis. Aortic Valve: The  aortic valve is tricuspid. Aortic valve regurgitation is not visualized. Aortic valve sclerosis/calcification is present, without any evidence of aortic stenosis. Aortic valve mean gradient measures 6.0 mmHg. Aortic valve peak gradient measures 8.5 mmHg. Aortic valve area, by VTI measures 3.06 cm. Pulmonic Valve: The pulmonic valve was normal in structure. Pulmonic valve regurgitation is trivial. No evidence of pulmonic stenosis. Aorta: Aortic dilatation noted. There is mild dilatation of the aortic root, measuring 42 mm. There is mild dilatation of the ascending aorta, measuring 41 mm. Venous: The inferior vena cava is normal in size with greater than 50% respiratory variability, suggesting right atrial pressure of 3 mmHg. IAS/Shunts: No atrial level shunt detected by color flow Doppler.  LEFT VENTRICLE PLAX 2D LVIDd:         2.80 cm   Diastology LVIDs:         2.30 cm   LV e' medial:    6.96 cm/s LV PW:         1.55 cm   LV E/e' medial:  9.9 LV IVS:        1.55 cm   LV e' lateral:   7.40 cm/s LVOT diam:     2.30 cm   LV E/e' lateral: 9.3 LV SV:         83 LV SV Index:   42 LVOT Area:     4.15 cm  RIGHT VENTRICLE             IVC RV Basal diam:  4.60 cm     IVC diam: 2.20 cm RV S prime:     12.30 cm/s TAPSE (M-mode): 1.8 cm LEFT ATRIUM             Index        RIGHT ATRIUM           Index LA Vol (A2C):   65.1 ml 33.15 ml/m  RA Area:     21.30 cm LA Vol (A4C):   49.3 ml 25.10 ml/m  RA Volume:   57.20 ml  29.13 ml/m LA Biplane Vol: 60.7 ml 30.91 ml/m  AORTIC VALVE AV Area (Vmax):    3.13 cm AV Area (Vmean):   2.91 cm AV Area (VTI):     3.06 cm AV Vmax:           146.00 cm/s AV Vmean:          112.000 cm/s AV VTI:            0.270 m AV Peak Grad:      8.5 mmHg AV Mean Grad:      6.0 mmHg LVOT Vmax:         110.00 cm/s LVOT Vmean:        78.500 cm/s LVOT VTI:          0.199 m LVOT/AV VTI ratio: 0.74  AORTA Ao Root diam: 4.20 cm Ao Asc diam:  4.10 cm MITRAL VALVE                TRICUSPID VALVE MV Area (PHT):  2.13 cm     TR Peak grad:   28.7 mmHg MV Area VTI:   2.92 cm     TR Vmax:        268.00 cm/s MV Peak grad:  6.0 mmHg MV Mean grad:  2.0 mmHg     SHUNTS MV Vmax:       1.22 m/s     Systemic VTI:  0.20 m MV Vmean:      68.4 cm/s    Systemic Diam: 2.30 cm MV Decel Time: 356 msec MV E velocity: 68.65 cm/s MV A velocity: 104.50 cm/s MV E/A ratio:  0.66 Olga Millers MD Electronically signed by Olga Millers MD Signature Date/Time: 05/11/2023/1:16:49 PM    Final       Joycelyn Das, MD  [facility] 05/12/2023  If 7PM-7AM, please contact night-coverage

## 2023-05-13 DIAGNOSIS — I6521 Occlusion and stenosis of right carotid artery: Secondary | ICD-10-CM | POA: Diagnosis not present

## 2023-05-13 DIAGNOSIS — I639 Cerebral infarction, unspecified: Secondary | ICD-10-CM | POA: Diagnosis not present

## 2023-05-13 LAB — CBC
HCT: 33.5 % — ABNORMAL LOW (ref 39.0–52.0)
Hemoglobin: 10.6 g/dL — ABNORMAL LOW (ref 13.0–17.0)
MCH: 28.4 pg (ref 26.0–34.0)
MCHC: 31.6 g/dL (ref 30.0–36.0)
MCV: 89.8 fL (ref 80.0–100.0)
Platelets: 240 10*3/uL (ref 150–400)
RBC: 3.73 MIL/uL — ABNORMAL LOW (ref 4.22–5.81)
RDW: 14.3 % (ref 11.5–15.5)
WBC: 7 10*3/uL (ref 4.0–10.5)
nRBC: 0 % (ref 0.0–0.2)

## 2023-05-13 LAB — BASIC METABOLIC PANEL
Anion gap: 9 (ref 5–15)
BUN: 12 mg/dL (ref 8–23)
CO2: 25 mmol/L (ref 22–32)
Calcium: 9.2 mg/dL (ref 8.9–10.3)
Chloride: 103 mmol/L (ref 98–111)
Creatinine, Ser: 1.01 mg/dL (ref 0.61–1.24)
GFR, Estimated: >60 mL/min (ref >60)
Glucose, Bld: 98 mg/dL (ref 70–99)
Potassium: 3.8 mmol/L (ref 3.5–5.1)
Sodium: 137 mmol/L (ref 135–145)

## 2023-05-13 LAB — MRSA NEXT GEN BY PCR, NASAL: MRSA by PCR Next Gen: NOT DETECTED

## 2023-05-13 MED ORDER — DOCUSATE SODIUM 100 MG PO CAPS
100.0000 mg | ORAL_CAPSULE | Freq: Two times a day (BID) | ORAL | Status: DC
  Administered 2023-05-13 – 2023-05-14 (×2): 100 mg via ORAL
  Filled 2023-05-13 (×3): qty 1

## 2023-05-13 MED ORDER — POLYETHYLENE GLYCOL 3350 17 G PO PACK
17.0000 g | PACK | Freq: Every day | ORAL | Status: DC
  Administered 2023-05-13: 17 g via ORAL
  Filled 2023-05-13: qty 1

## 2023-05-13 NOTE — Plan of Care (Signed)

## 2023-05-13 NOTE — Plan of Care (Signed)
  Problem: Education: Goal: Knowledge of disease or condition will improve Outcome: Progressing   Problem: Ischemic Stroke/TIA Tissue Perfusion: Goal: Complications of ischemic stroke/TIA will be minimized Outcome: Progressing   Problem: Coping: Goal: Will verbalize positive feelings about self Outcome: Progressing Goal: Will identify appropriate support needs Outcome: Progressing   Problem: Nutrition: Goal: Risk of aspiration will decrease Outcome: Progressing Goal: Dietary intake will improve Outcome: Progressing   Problem: Education: Goal: Knowledge of General Education information will improve Description: Including pain rating scale, medication(s)/side effects and non-pharmacologic comfort measures Outcome: Progressing   Problem: Clinical Measurements: Goal: Respiratory complications will improve Outcome: Progressing Goal: Cardiovascular complication will be avoided Outcome: Progressing   Problem: Elimination: Goal: Will not experience complications related to urinary retention Outcome: Progressing

## 2023-05-13 NOTE — Progress Notes (Signed)
 Occupational Therapy Treatment Patient Details Name: Song A. Wetsel MRN: [id number] DOB: May 07, 1933 Today's Date: 05/13/2023   History of present illness Pt is an 89 y/o M admitted on 05/09/23 after presenting to the ED with c/o numbness to L face & LUE/LLE x the last few weeks, with worsening ability to walk starting on Saturday (3 days prior).  MRI showed "multifocal acute infarcts within the right MCA & PCA  territories, including the right precentral gyrus." PMH: HTN, gout, bladder CA, lung nodule, B12 deficiency, neuropathy, PVD, essential tremor   OT comments  Pt reports he would like to continue with use of RW and utilize it more in home upon DC. His LUE continues to display coordination deficits, educated pt/family on [facility] exercises to continue challenging him-handout provided. Pt with impaired 2pt discrimination, able to distinguish hot vs cold without challenge, identifies 5/5 objects placed in L hand without challenge. OT to continue to progress pt as able, DC plans remain appropriate for [facility].       If plan is discharge home, recommend the following:  A little help with walking and/or transfers;A little help with bathing/dressing/bathroom;Assistance with cooking/housework;Direct supervision/assist for medications management;Direct supervision/assist for financial management;Assist for transportation;Help with stairs or ramp for entrance   Equipment Recommendations  Other (comment);BSC/3in1 (RW)    Recommendations for Other Services      Precautions / Restrictions Precautions Precautions: Fall Recall of Precautions/Restrictions: Intact Restrictions Weight Bearing Restrictions Per Provider Order: No       Mobility Bed Mobility Overal bed mobility: Modified Independent                  Transfers Overall transfer level: Needs assistance Equipment used: Rolling walker (2 wheels) Transfers: Sit to/from Stand Sit to Stand: Supervision                  Balance Overall balance assessment: Needs assistance Sitting-balance support: No upper extremity supported, Feet supported Sitting balance-Leahy Scale: Good     Standing balance support: Single extremity supported, Reliant on assistive device for balance Standing balance-Leahy Scale: Poor Standing balance comment: reliant on external support                           ADL either performed or assessed with clinical judgement   ADL                       Lower Body Dressing: Sitting/lateral leans;Supervision/safety               Functional mobility during ADLs: Rolling walker (2 wheels);Contact guard assist General ADL Comments: pt reports RW gives him more support and he would like to continue use of it upon DC. Educated pt/family on [facility] HEP and modifications to continue challenging coordination    Extremity/Trunk Assessment Upper Extremity Assessment LUE Deficits / Details: LUE dysmetria, numbness along L forearm. able to use functionally. impaired 2pt discrimination- pt would report 2 fingers touching his R arm when only 1 would touch his L and 1 touching his R            Vision       Perception     Praxis     Communication Communication Communication: No apparent difficulties   Cognition Arousal: Alert Behavior During Therapy: WFL for tasks assessed/performed       Awareness: Intellectual awareness intact, Online awareness impaired  Following commands: Intact        Cueing   Cueing Techniques: Verbal cues  Exercises Other Exercises Other Exercises: in hand manipulation of cut up sponges with placement into container LUE Other Exercises: twirl pen around fingers LUE Other Exercises: pointing to labeled items using LUE    Shoulder Instructions       General Comments      Pertinent Vitals/ Pain       Pain Assessment Pain Assessment: No/denies pain  Home Living                                           Prior Functioning/Environment              Frequency  Min 2X/week        Progress Toward Goals  OT Goals(current goals can now be found in the care plan section)  Progress towards OT goals: Progressing toward goals  Acute Rehab OT Goals Patient Stated Goal: get better and go home OT Goal Formulation: With patient/family Time For Goal Achievement: 05/25/23 Potential to Achieve Goals: Good  Plan      Co-evaluation                 AM-PAC OT "6 Clicks" Daily Activity     Outcome Measure   Help from another person eating meals?: A Little Help from another person taking care of personal grooming?: A Little Help from another person toileting, which includes using toliet, bedpan, or urinal?: A Little Help from another person bathing (including washing, rinsing, drying)?: A Little Help from another person to put on and taking off regular upper body clothing?: A Little Help from another person to put on and taking off regular lower body clothing?: A Little 6 Click Score: 18    End of Session Equipment Utilized During Treatment: Gait belt;Rolling walker (2 wheels)  OT Visit Diagnosis: Unsteadiness on feet (R26.81);Other abnormalities of gait and mobility (R26.89);Muscle weakness (generalized) (M62.81);Ataxia, unspecified (R27.0)   Activity Tolerance Patient tolerated treatment well   Patient Left in bed;with call bell/phone within reach;with family/visitor present   Nurse Communication Mobility status        Time: 5784-6962 OT Time Calculation (min): 39 min  Charges: OT General Charges $OT Visit: 1 Visit OT Treatments $Therapeutic Activity: 23-37 mins $Therapeutic Exercise: 8-22 mins  05/13/2023  AB, OTR/L  Acute Rehabilitation Services  Office: [phone]   Tristan Schroeder 05/13/2023, 6:04 PM

## 2023-05-13 NOTE — Care Management Important Message (Signed)
 Important Message  Patient Details  Name: Stephen Meza MRN: [id number] Date of Birth: 01-Sep-1933   Important Message Given:  Yes - Medicare IM     Dynisha Due 05/13/2023, 1:47 PM

## 2023-05-13 NOTE — Anesthesia Preprocedure Evaluation (Signed)
 Anesthesia Evaluation  Patient identified by MRN, date of birth, ID band Patient awake    Reviewed: Allergy & Precautions, NPO status , Patient's Chart, lab work & pertinent test results  Airway Mallampati: II  TM Distance: >3 FB Neck ROM: Full    Dental  (+) Dental Advisory Given, Chipped,    Pulmonary former smoker   Pulmonary exam normal breath sounds clear to auscultation       Cardiovascular hypertension, Pt. on medications + Peripheral Vascular Disease  Normal cardiovascular exam Rhythm:Regular Rate:Normal  TTE 04/2023 1. Left ventricular ejection fraction, by estimation, is 55 to 60%. The  left ventricle has normal function. The left ventricle has no regional  wall motion abnormalities. There is mild concentric left ventricular  hypertrophy. Left ventricular diastolic  parameters are consistent with Grade I diastolic dysfunction (impaired  relaxation).   2. Right ventricular systolic function is normal. The right ventricular  size is mildly enlarged.   3. The mitral valve is normal in structure. Mild mitral valve  regurgitation. No evidence of mitral stenosis.   4. The aortic valve is tricuspid. Aortic valve regurgitation is not  visualized. Aortic valve sclerosis/calcification is present, without any  evidence of aortic stenosis.   5. Aortic dilatation noted. There is mild dilatation of the aortic root,  measuring 42 mm. There is mild dilatation of the ascending aorta,  measuring 41 mm.   6. The inferior vena cava is normal in size with greater than 50%  respiratory variability, suggesting right atrial pressure of 3 mmHg.     Neuro/Psych CVA (left sided weakness), Residual Symptoms  negative psych ROS   GI/Hepatic Neg liver ROS,GERD  ,,  Endo/Other  negative endocrine ROS    Renal/GU negative Renal ROS  negative genitourinary   Musculoskeletal  (+) Arthritis ,    Abdominal   Peds  Hematology negative  hematology ROS (+)   Anesthesia Other Findings   Reproductive/Obstetrics                             Anesthesia Physical Anesthesia Plan  ASA: 3  Anesthesia Plan: General   Post-op Pain Management: Tylenol PO (pre-op)*   Induction: Intravenous  PONV Risk Score and Plan: 2 and Dexamethasone, Ondansetron and Treatment may vary due to age or medical condition  Airway Management Planned: Oral ETT  Additional Equipment: Arterial line  Intra-op Plan:   Post-operative Plan: Extubation in OR  Informed Consent: I have reviewed the patients History and Physical, chart, labs and discussed the procedure including the risks, benefits and alternatives for the proposed anesthesia with the patient or authorized representative who has indicated his/her understanding and acceptance.     Dental advisory given  Plan Discussed with: CRNA  Anesthesia Plan Comments: (2 IVs)       Anesthesia Quick Evaluation

## 2023-05-13 NOTE — Progress Notes (Addendum)
  Progress Note    05/13/2023 7:44 AM * No surgery found *  Subjective:  no further stroke like symptoms   Vitals:   05/13/23 0336 05/13/23 0729  BP: (!) 181/76 (!) 167/84  Pulse: (!) 54 70  Resp: 18 18  Temp: 99 F (37.2 C) 98.6 F (37 C)  SpO2: 95% 96%   Physical Exam: Lungs:  non labored Extremities:  moving all ext well Neurologic: CN grossly intact  CBC    Component Value Date/Time   WBC 7.1 05/11/2023 0506   RBC 3.86 (L) 05/11/2023 0506   HGB 11.0 (L) 05/11/2023 0506   HCT 34.8 (L) 05/11/2023 0506   PLT 239 05/11/2023 0506   MCV 90.2 05/11/2023 0506   MCH 28.5 05/11/2023 0506   MCHC 31.6 05/11/2023 0506   RDW 14.3 05/11/2023 0506   LYMPHSABS 1.0 05/09/2023 1654   MONOABS 0.6 05/09/2023 1654   EOSABS 0.0 05/09/2023 1654   BASOSABS 0.0 05/09/2023 1654    BMET    Component Value Date/Time   NA 136 05/11/2023 0506   K 4.1 05/11/2023 0506   CL 103 05/11/2023 0506   CO2 25 05/11/2023 0506   GLUCOSE 114 (H) 05/11/2023 0506   BUN 12 05/11/2023 0506   CREATININE 0.93 05/11/2023 0506   CALCIUM 9.1 05/11/2023 0506   GFRNONAA >60 05/11/2023 0506   GFRAA 122 09/08/2007 1631    INR    Component Value Date/Time   INR 1.0 05/11/2023 0506    No intake or output data in the 24 hours ending 05/13/23 0744   Assessment/Plan:  89 y.o. male with symptomatic right ICA stenosis  No further stroke like symptoms overnight; persistent slight weakness in L hand Continue aspirin, plavix, statin daily Plan is for right TCAR tomorrow 3/21 with Dr. Karin Lieu.  Surgery was discussed in detail with the patient and his wife and all questions were answered. NPO past midnight Consent ordered   Emilie Rutter, PA-C [facility] [phone] 05/13/2023 7:44 AM  VASCULAR STAFF ADDENDUM: I have independently interviewed and examined the patient. I agree with the above.  Plan for RIGHT TCAR tomorrow   Victorino Sparrow MD [facility]  [facility] Phone Number: [phone] 05/13/2023 8:46 AM

## 2023-05-13 NOTE — Progress Notes (Signed)
 PROGRESS NOTE  Stephen Meza HQI:[id number] DOB: 12-Sep-1933 DOA: 05/09/2023 PCP: Stephen Cipro, FNP   LOS: 3 days   Brief narrative:  Stephen Meza is a 89 y.o. male with history of hypertension, gout, prior history of bladder cancer, lung nodule scheduled for lung biopsy 05/10/23, B12 deficiency neuropathy presented to the ED with intermittent weakness and numbness of the left side of his body for few weeks.  For last 3 days symptoms were pronounced and patient had some difficulty walking as well.  Labs show hemoglobin of 11.3 EKG shows normal sinus rhythm. MRI of the brain showed  multifocal acute infarct involving the right MCA and PCA territory.  CT angiogram head and neck shows right ICA stenosis with string sign.  Neurology on-call was consulted patient was placed on Plavix, aspirin and patient was admitted for further management of acute stroke.     Assessment/Plan: Principal Problem:   Acute CVA (cerebrovascular accident) [facility]) Active Problems:   Vitamin B12 deficiency   Essential hypertension   Anemia   History of gout   Stroke [facility])  Acute ischemic CVA  Significant right ICA stenosis Seen by neurology. Neurology recommended vascular surgery consultation for significant right ICA stenosis.  Vascular surgery has seen the patient at this time and recommend to continue aspirin statin and plan for TCAR on 05/14/2023.  Keep n.p.o. past midnight.   Hypertension Permissive hypertension for now.  IV hydralazine for SBP more than 220.  Latest blood pressure of 167/84   Chronic anemia with B12 deficiency.  Continue vitamin B12 supplements.   Latest vitamin B12 of 769, folate of 14.8, iron of 66 and TIBC 287, ferritin of 48.   History of gout on allopurinol.  Will continue   Lung nodule being followed by pulmonologist originally scheduled for biopsy later presentation but currently in the hospital.  Follow-up as outpatient.  Constipation.  Will add stool softener and  MiraLAX.  DVT prophylaxis: enoxaparin (LOVENOX) injection 40 mg Start: 05/10/23 1000   Disposition: Likely home with home health after surgical intervention.  Status is: Inpatient Remains inpatient appropriate because: Need for surgical intervention,    Code Status:     Code Status: Full Code  Family Communication: Spoke with the patient's spouse at bedside  Consultants:  neurology Vascular surgery  Procedures: None so far  Anti-infectives:  None  Anti-infectives (From admission, onward)    None       Subjective: Today, patient was seen and examined at bedside.  Eating well today denies any dizziness lightheadedness shortness of breath or chest pain.  Has been having some constipation  Objective: Vitals:   05/13/23 0336 05/13/23 0729  BP: (!) 181/76 (!) 167/84  Pulse: (!) 54 70  Resp: 18 18  Temp: 99 F (37.2 C) 98.6 F (37 C)  SpO2: 95% 96%   No intake or output data in the 24 hours ending 05/13/23 1105  Filed Weights   05/09/23 1648  Weight: 74.9 kg   Body mass index is 22.39 kg/m.   Physical Exam:  GENERAL: Patient is alert awake and oriented. Not in obvious distress.  Elderly male, Communicative. HENT: No scleral pallor or icterus. Pupils equally reactive to light. Oral mucosa is moist NECK: is supple, no gross swelling noted. CHEST: Clear to auscultation. No crackles or wheezes.   CVS: S1 and S2 heard, no murmur. Regular rate and rhythm.  ABDOMEN: Soft, non-tender, bowel sounds are present. EXTREMITIES: No edema. CNS: Mild left facial droop.  Mild weakness of  the left arm and leg. SKIN: warm and dry without rashes.  Data Review: I have personally reviewed the following laboratory data and studies,  CBC: Recent Labs  Lab 05/09/23 1654 05/10/23 0939 05/11/23 0506 05/13/23 0658  WBC 7.3 6.7 7.1 7.0  NEUTROABS 5.7  --   --   --   HGB 11.3* 11.6* 11.0* 10.6*  HCT 34.8* 37.4* 34.8* 33.5*  MCV 89.2 91.0 90.2 89.8  PLT 239 233 239 240    Basic Metabolic Panel: Recent Labs  Lab 05/09/23 1654 05/10/23 0939 05/11/23 0506 05/13/23 0658  NA 136 133* 136 137  K 4.2 3.8 4.1 3.8  CL 101 99 103 103  CO2 25 23 25 25   GLUCOSE 106* 114* 114* 98  BUN 15 12 12 12   CREATININE 0.88 0.80 0.93 1.01  CALCIUM 9.3 9.2 9.1 9.2  MG  --   --  2.2  --    Liver Function Tests: Recent Labs  Lab 05/09/23 1654 05/10/23 0939  AST 19 24  ALT 9 11  ALKPHOS 80 78  BILITOT 0.7 0.7  PROT 8.2* 8.2*  ALBUMIN 3.9 3.8   Recent Labs  Lab 05/09/23 1654  LIPASE 31   No results for input(s): "AMMONIA" in the last 168 hours. Cardiac Enzymes: No results for input(s): "CKTOTAL", "CKMB", "CKMBINDEX", "TROPONINI" in the last 168 hours. BNP (last 3 results) No results for input(s): "BNP" in the last 8760 hours.  ProBNP (last 3 results) No results for input(s): "PROBNP" in the last 8760 hours.  CBG: No results for input(s): "GLUCAP" in the last 168 hours. No results found for this or any previous visit (from the past 240 hours).   Studies: ECHOCARDIOGRAM COMPLETE Result Date: 05/11/2023    ECHOCARDIOGRAM REPORT   Patient Name:   Stephen Meza Date of Exam: 05/11/2023 Medical Rec #:  [id number]            Height:       72.0 in Accession #:    [id number]           Weight:       165.1 lb Date of Birth:  [birth year]            BSA:          1.964 m Patient Age:    89 years             BP:           170/72 mmHg Patient Gender: M                    HR:           71 bpm. Exam Location:  Inpatient Procedure: 2D Echo, Cardiac Doppler and Color Doppler (Both Spectral and Color            Flow Doppler were utilized during procedure). Indications:    Stroke  History:        Patient has no prior history of Echocardiogram examinations.                 Stroke; Risk Factors:Hypertension and Former Smoker.  Sonographer:    Lamont Snowball Referring Phys: [phone] Stephen Meza  Sonographer Comments: Technically difficult study due to poor echo windows. Image  acquisition challenging due to respiratory motion. IMPRESSIONS  1. Left ventricular ejection fraction, by estimation, is 55 to 60%. The left ventricle has normal function. The left ventricle has no regional wall motion abnormalities. There is mild concentric  left ventricular hypertrophy. Left ventricular diastolic parameters are consistent with Grade I diastolic dysfunction (impaired relaxation).  2. Right ventricular systolic function is normal. The right ventricular size is mildly enlarged.  3. The mitral valve is normal in structure. Mild mitral valve regurgitation. No evidence of mitral stenosis.  4. The aortic valve is tricuspid. Aortic valve regurgitation is not visualized. Aortic valve sclerosis/calcification is present, without any evidence of aortic stenosis.  5. Aortic dilatation noted. There is mild dilatation of the aortic root, measuring 42 mm. There is mild dilatation of the ascending aorta, measuring 41 mm.  6. The inferior vena cava is normal in size with greater than 50% respiratory variability, suggesting right atrial pressure of 3 mmHg. Comparison(s): No prior Echocardiogram. FINDINGS  Left Ventricle: Left ventricular ejection fraction, by estimation, is 55 to 60%. The left ventricle has normal function. The left ventricle has no regional wall motion abnormalities. The left ventricular internal cavity size was normal in size. There is  mild concentric left ventricular hypertrophy. Left ventricular diastolic parameters are consistent with Grade I diastolic dysfunction (impaired relaxation). Right Ventricle: The right ventricular size is mildly enlarged. Right ventricular systolic function is normal. Left Atrium: Left atrial size was normal in size. Right Atrium: Right atrial size was normal in size. Pericardium: There is no evidence of pericardial effusion. Mitral Valve: The mitral valve is normal in structure. Mild mitral annular calcification. Mild mitral valve regurgitation. No evidence of  mitral valve stenosis. MV peak gradient, 6.0 mmHg. The mean mitral valve gradient is 2.0 mmHg. Tricuspid Valve: The tricuspid valve is normal in structure. Tricuspid valve regurgitation is mild . No evidence of tricuspid stenosis. Aortic Valve: The aortic valve is tricuspid. Aortic valve regurgitation is not visualized. Aortic valve sclerosis/calcification is present, without any evidence of aortic stenosis. Aortic valve mean gradient measures 6.0 mmHg. Aortic valve peak gradient measures 8.5 mmHg. Aortic valve area, by VTI measures 3.06 cm. Pulmonic Valve: The pulmonic valve was normal in structure. Pulmonic valve regurgitation is trivial. No evidence of pulmonic stenosis. Aorta: Aortic dilatation noted. There is mild dilatation of the aortic root, measuring 42 mm. There is mild dilatation of the ascending aorta, measuring 41 mm. Venous: The inferior vena cava is normal in size with greater than 50% respiratory variability, suggesting right atrial pressure of 3 mmHg. IAS/Shunts: No atrial level shunt detected by color flow Doppler.  LEFT VENTRICLE PLAX 2D LVIDd:         2.80 cm   Diastology LVIDs:         2.30 cm   LV e' medial:    6.96 cm/s LV PW:         1.55 cm   LV E/e' medial:  9.9 LV IVS:        1.55 cm   LV e' lateral:   7.40 cm/s LVOT diam:     2.30 cm   LV E/e' lateral: 9.3 LV SV:         83 LV SV Index:   42 LVOT Area:     4.15 cm  RIGHT VENTRICLE             IVC RV Basal diam:  4.60 cm     IVC diam: 2.20 cm RV S prime:     12.30 cm/s TAPSE (M-mode): 1.8 cm LEFT ATRIUM             Index        RIGHT ATRIUM  Index LA Vol (A2C):   65.1 ml 33.15 ml/m  RA Area:     21.30 cm LA Vol (A4C):   49.3 ml 25.10 ml/m  RA Volume:   57.20 ml  29.13 ml/m LA Biplane Vol: 60.7 ml 30.91 ml/m  AORTIC VALVE AV Area (Vmax):    3.13 cm AV Area (Vmean):   2.91 cm AV Area (VTI):     3.06 cm AV Vmax:           146.00 cm/s AV Vmean:          112.000 cm/s AV VTI:            0.270 m AV Peak Grad:      8.5 mmHg AV  Mean Grad:      6.0 mmHg LVOT Vmax:         110.00 cm/s LVOT Vmean:        78.500 cm/s LVOT VTI:          0.199 m LVOT/AV VTI ratio: 0.74  AORTA Ao Root diam: 4.20 cm Ao Asc diam:  4.10 cm MITRAL VALVE                TRICUSPID VALVE MV Area (PHT): 2.13 cm     TR Peak grad:   28.7 mmHg MV Area VTI:   2.92 cm     TR Vmax:        268.00 cm/s MV Peak grad:  6.0 mmHg MV Mean grad:  2.0 mmHg     SHUNTS MV Vmax:       1.22 m/s     Systemic VTI:  0.20 m MV Vmean:      68.4 cm/s    Systemic Diam: 2.30 cm MV Decel Time: 356 msec MV E velocity: 68.65 cm/s MV A velocity: 104.50 cm/s MV E/A ratio:  0.66 Olga Millers MD Electronically signed by Olga Millers MD Signature Date/Time: 05/11/2023/1:16:49 PM    Final       Joycelyn Das, MD  [facility] 05/13/2023  If 7PM-7AM, please contact night-coverage

## 2023-05-14 ENCOUNTER — Inpatient Hospital Stay (HOSPITAL_COMMUNITY): Admitting: Anesthesiology

## 2023-05-14 ENCOUNTER — Other Ambulatory Visit: Payer: Self-pay

## 2023-05-14 ENCOUNTER — Inpatient Hospital Stay (HOSPITAL_COMMUNITY)

## 2023-05-14 ENCOUNTER — Encounter (HOSPITAL_COMMUNITY)
Admission: EM | Disposition: A | Discharge status: Home Health | Payer: Self-pay | Source: Home / Self Care | Attending: Internal Medicine

## 2023-05-14 ENCOUNTER — Inpatient Hospital Stay (HOSPITAL_COMMUNITY): Admission: RE | Admit: 2023-05-14 | Source: Home / Self Care | Admitting: Vascular Surgery

## 2023-05-14 ENCOUNTER — Encounter (HOSPITAL_COMMUNITY): Payer: Self-pay | Admitting: Internal Medicine

## 2023-05-14 ENCOUNTER — Other Ambulatory Visit

## 2023-05-14 DIAGNOSIS — I6521 Occlusion and stenosis of right carotid artery: Secondary | ICD-10-CM | POA: Diagnosis not present

## 2023-05-14 DIAGNOSIS — Z87891 Personal history of nicotine dependence: Secondary | ICD-10-CM

## 2023-05-14 DIAGNOSIS — I639 Cerebral infarction, unspecified: Secondary | ICD-10-CM | POA: Diagnosis not present

## 2023-05-14 DIAGNOSIS — I10 Essential (primary) hypertension: Secondary | ICD-10-CM | POA: Diagnosis not present

## 2023-05-14 HISTORY — PX: TRANSCAROTID ARTERY REVASCULARIZATIONÂ: SHX6778

## 2023-05-14 LAB — PROTIME-INR
INR: 1 (ref 0.8–1.2)
Prothrombin Time: 13.6 s (ref 11.4–15.2)

## 2023-05-14 LAB — APTT: aPTT: 28 s (ref 24–36)

## 2023-05-14 LAB — CBC
HCT: 34.4 % — ABNORMAL LOW (ref 39.0–52.0)
Hemoglobin: 11.1 g/dL — ABNORMAL LOW (ref 13.0–17.0)
MCH: 28.8 pg (ref 26.0–34.0)
MCHC: 32.3 g/dL (ref 30.0–36.0)
MCV: 89.4 fL (ref 80.0–100.0)
Platelets: 240 10*3/uL (ref 150–400)
RBC: 3.85 MIL/uL — ABNORMAL LOW (ref 4.22–5.81)
RDW: 14.4 % (ref 11.5–15.5)
WBC: 6.4 10*3/uL (ref 4.0–10.5)
nRBC: 0 % (ref 0.0–0.2)

## 2023-05-14 LAB — BASIC METABOLIC PANEL
Anion gap: 9 (ref 5–15)
BUN: 14 mg/dL (ref 8–23)
CO2: 25 mmol/L (ref 22–32)
Calcium: 9.1 mg/dL (ref 8.9–10.3)
Chloride: 102 mmol/L (ref 98–111)
Creatinine, Ser: 0.93 mg/dL (ref 0.61–1.24)
GFR, Estimated: >60 mL/min (ref >60)
Glucose, Bld: 103 mg/dL — ABNORMAL HIGH (ref 70–99)
Potassium: 3.9 mmol/L (ref 3.5–5.1)
Sodium: 136 mmol/L (ref 135–145)

## 2023-05-14 LAB — TYPE AND SCREEN
ABO/RH(D): O POS
Antibody Screen: NEGATIVE

## 2023-05-14 LAB — MAGNESIUM: Magnesium: 2 mg/dL (ref 1.7–2.4)

## 2023-05-14 LAB — ABO/RH: ABO/RH(D): O POS

## 2023-05-14 SURGERY — TRANSCAROTID ARTERY REVASCULARIZATION (TCAR)
Anesthesia: General | Site: Neck | Laterality: Right

## 2023-05-14 MED ORDER — EPHEDRINE SULFATE-NACL 50-0.9 MG/10ML-% IV SOSY
PREFILLED_SYRINGE | INTRAVENOUS | Status: DC | PRN
  Administered 2023-05-14: 5 mg via INTRAVENOUS
  Administered 2023-05-14: 2.5 mg via INTRAVENOUS

## 2023-05-14 MED ORDER — LACTATED RINGERS IV SOLN: INTRAVENOUS | Status: DC

## 2023-05-14 MED ORDER — IODIXANOL 320 MG/ML IV SOLN
INTRAVENOUS | Status: DC | PRN
  Administered 2023-05-14: 28 mL

## 2023-05-14 MED ORDER — MIDAZOLAM HCL 2 MG/2ML IJ SOLN
INTRAMUSCULAR | Status: DC | PRN
  Administered 2023-05-14 (×2): 1 mg via INTRAVENOUS

## 2023-05-14 MED ORDER — PHENYLEPHRINE HCL-NACL 20-0.9 MG/250ML-% IV SOLN
INTRAVENOUS | Status: DC | PRN
  Administered 2023-05-14: 50 ug/min via INTRAVENOUS

## 2023-05-14 MED ORDER — ACETAMINOPHEN 500 MG PO TABS
1000.0000 mg | ORAL_TABLET | Freq: Once | ORAL | Status: AC
  Administered 2023-05-14: 1000 mg via ORAL
  Filled 2023-05-14: qty 2

## 2023-05-14 MED ORDER — ATROPINE SULFATE 0.4 MG/ML IV SOLN
INTRAVENOUS | Status: DC | PRN
Start: 2023-05-14 — End: 2023-05-14
  Administered 2023-05-14: .4 mg via INTRAVENOUS

## 2023-05-14 MED ORDER — HEMOSTATIC AGENTS (NO CHARGE) OPTIME
TOPICAL | Status: DC | PRN
  Administered 2023-05-14 (×2): 1 via TOPICAL

## 2023-05-14 MED ORDER — PROPOFOL 10 MG/ML IV BOLUS
INTRAVENOUS | Status: AC
  Filled 2023-05-14: qty 20

## 2023-05-14 MED ORDER — HYDRALAZINE HCL 20 MG/ML IJ SOLN: 5.0000 mg | INTRAMUSCULAR | Status: DC | PRN

## 2023-05-14 MED ORDER — ONDANSETRON HCL 4 MG/2ML IJ SOLN: 4.0000 mg | Freq: Four times a day (QID) | INTRAMUSCULAR | Status: DC | PRN

## 2023-05-14 MED ORDER — SUGAMMADEX SODIUM 200 MG/2ML IV SOLN
INTRAVENOUS | Status: DC | PRN
  Administered 2023-05-14: 200 mg via INTRAVENOUS

## 2023-05-14 MED ORDER — ROCURONIUM BROMIDE 10 MG/ML (PF) SYRINGE
PREFILLED_SYRINGE | INTRAVENOUS | Status: DC | PRN
  Administered 2023-05-14: 50 mg via INTRAVENOUS
  Administered 2023-05-14: 10 mg via INTRAVENOUS

## 2023-05-14 MED ORDER — CHLORHEXIDINE GLUCONATE 0.12 % MT SOLN
15.0000 mL | Freq: Once | OROMUCOSAL | Status: AC
  Administered 2023-05-14: 15 mL via OROMUCOSAL

## 2023-05-14 MED ORDER — ONDANSETRON HCL 4 MG/2ML IJ SOLN
INTRAMUSCULAR | Status: DC | PRN
  Administered 2023-05-14: 4 mg via INTRAVENOUS

## 2023-05-14 MED ORDER — ONDANSETRON HCL 4 MG/2ML IJ SOLN
INTRAMUSCULAR | Status: AC
  Filled 2023-05-14: qty 2

## 2023-05-14 MED ORDER — ALUM & MAG HYDROXIDE-SIMETH 200-200-20 MG/5ML PO SUSP: 15.0000 mL | ORAL | Status: DC | PRN

## 2023-05-14 MED ORDER — SODIUM CHLORIDE 0.9 % IV SOLN: 500.0000 mL | Freq: Once | INTRAVENOUS | Status: DC | PRN

## 2023-05-14 MED ORDER — MIDAZOLAM HCL 2 MG/2ML IJ SOLN
INTRAMUSCULAR | Status: AC
  Filled 2023-05-14: qty 2

## 2023-05-14 MED ORDER — DEXAMETHASONE SODIUM PHOSPHATE 10 MG/ML IJ SOLN
INTRAMUSCULAR | Status: AC
  Filled 2023-05-14: qty 1

## 2023-05-14 MED ORDER — AMISULPRIDE (ANTIEMETIC) 5 MG/2ML IV SOLN: 10.0000 mg | Freq: Once | INTRAVENOUS | Status: DC | PRN

## 2023-05-14 MED ORDER — FENTANYL CITRATE (PF) 100 MCG/2ML IJ SOLN: 25.0000 ug | INTRAMUSCULAR | Status: DC | PRN

## 2023-05-14 MED ORDER — DEXAMETHASONE SODIUM PHOSPHATE 10 MG/ML IJ SOLN
INTRAMUSCULAR | Status: DC | PRN
  Administered 2023-05-14: 10 mg via INTRAVENOUS

## 2023-05-14 MED ORDER — CHLORHEXIDINE GLUCONATE CLOTH 2 % EX PADS: 6.0000 | MEDICATED_PAD | Freq: Once | CUTANEOUS | Status: AC

## 2023-05-14 MED ORDER — LIDOCAINE HCL (PF) 1 % IJ SOLN
INTRAMUSCULAR | Status: AC
  Filled 2023-05-14: qty 30

## 2023-05-14 MED ORDER — METOPROLOL TARTRATE 5 MG/5ML IV SOLN: 2.0000 mg | INTRAVENOUS | Status: DC | PRN

## 2023-05-14 MED ORDER — HEPARIN 6000 UNIT IRRIGATION SOLUTION
Status: AC
  Filled 2023-05-14: qty 500

## 2023-05-14 MED ORDER — MAGNESIUM SULFATE 2 GM/50ML IV SOLN: 2.0000 g | Freq: Every day | INTRAVENOUS | Status: DC | PRN

## 2023-05-14 MED ORDER — PHENYLEPHRINE 80 MCG/ML (10ML) SYRINGE FOR IV PUSH (FOR BLOOD PRESSURE SUPPORT)
PREFILLED_SYRINGE | INTRAVENOUS | Status: AC
  Filled 2023-05-14: qty 10

## 2023-05-14 MED ORDER — HEPARIN SODIUM (PORCINE) 1000 UNIT/ML IJ SOLN
INTRAMUSCULAR | Status: DC | PRN
  Administered 2023-05-14: 2000 [IU] via INTRAVENOUS
  Administered 2023-05-14: 7000 [IU] via INTRAVENOUS

## 2023-05-14 MED ORDER — PROTAMINE SULFATE 10 MG/ML IV SOLN
INTRAVENOUS | Status: AC
  Filled 2023-05-14: qty 5

## 2023-05-14 MED ORDER — ORAL CARE MOUTH RINSE: 15.0000 mL | Freq: Once | OROMUCOSAL | Status: AC

## 2023-05-14 MED ORDER — PHENOL 1.4 % MT LIQD: 1.0000 | OROMUCOSAL | Status: DC | PRN

## 2023-05-14 MED ORDER — CHLORHEXIDINE GLUCONATE CLOTH 2 % EX PADS
6.0000 | MEDICATED_PAD | Freq: Once | CUTANEOUS | Status: AC
  Administered 2023-05-14: 6 via TOPICAL

## 2023-05-14 MED ORDER — TRAMADOL HCL 50 MG PO TABS: 50.0000 mg | ORAL_TABLET | Freq: Four times a day (QID) | ORAL | Status: DC | PRN

## 2023-05-14 MED ORDER — FENTANYL CITRATE (PF) 250 MCG/5ML IJ SOLN
INTRAMUSCULAR | Status: DC | PRN
  Administered 2023-05-14: 50 ug via INTRAVENOUS
  Administered 2023-05-14: 100 ug via INTRAVENOUS

## 2023-05-14 MED ORDER — HEPARIN SODIUM (PORCINE) 1000 UNIT/ML IJ SOLN
INTRAMUSCULAR | Status: AC
  Filled 2023-05-14: qty 10

## 2023-05-14 MED ORDER — OXYCODONE HCL 5 MG/5ML PO SOLN: 5.0000 mg | Freq: Once | ORAL | Status: DC | PRN

## 2023-05-14 MED ORDER — PROTAMINE SULFATE 10 MG/ML IV SOLN
INTRAVENOUS | Status: DC | PRN
  Administered 2023-05-14: 50 mg via INTRAVENOUS

## 2023-05-14 MED ORDER — PROPOFOL 10 MG/ML IV BOLUS
INTRAVENOUS | Status: DC | PRN
  Administered 2023-05-14 (×2): 100 mg via INTRAVENOUS

## 2023-05-14 MED ORDER — LIDOCAINE 2% (20 MG/ML) 5 ML SYRINGE
INTRAMUSCULAR | Status: AC
  Filled 2023-05-14: qty 5

## 2023-05-14 MED ORDER — LIDOCAINE 2% (20 MG/ML) 5 ML SYRINGE
INTRAMUSCULAR | Status: DC | PRN
  Administered 2023-05-14: 40 mg via INTRAVENOUS

## 2023-05-14 MED ORDER — PHENYLEPHRINE 80 MCG/ML (10ML) SYRINGE FOR IV PUSH (FOR BLOOD PRESSURE SUPPORT)
PREFILLED_SYRINGE | INTRAVENOUS | Status: DC | PRN
  Administered 2023-05-14: 80 ug via INTRAVENOUS

## 2023-05-14 MED ORDER — GUAIFENESIN-DM 100-10 MG/5ML PO SYRP: 15.0000 mL | ORAL_SOLUTION | ORAL | Status: DC | PRN

## 2023-05-14 MED ORDER — LABETALOL HCL 5 MG/ML IV SOLN: 10.0000 mg | INTRAVENOUS | Status: DC | PRN

## 2023-05-14 MED ORDER — SODIUM CHLORIDE 0.9 % IV SOLN: 250.0000 mL | INTRAVENOUS | Status: DC | PRN

## 2023-05-14 MED ORDER — CEFAZOLIN SODIUM-DEXTROSE 2-4 GM/100ML-% IV SOLN
2.0000 g | INTRAVENOUS | Status: AC
  Administered 2023-05-14: 2 g via INTRAVENOUS
  Filled 2023-05-14: qty 100

## 2023-05-14 MED ORDER — SODIUM CHLORIDE 0.9% FLUSH
3.0000 mL | Freq: Two times a day (BID) | INTRAVENOUS | Status: DC
  Administered 2023-05-14 – 2023-05-15 (×2): 3 mL via INTRAVENOUS

## 2023-05-14 MED ORDER — SODIUM CHLORIDE 0.9% FLUSH: 3.0000 mL | INTRAVENOUS | Status: DC | PRN

## 2023-05-14 MED ORDER — OXYCODONE HCL 5 MG PO TABS: 5.0000 mg | ORAL_TABLET | Freq: Once | ORAL | Status: DC | PRN

## 2023-05-14 MED ORDER — SUGAMMADEX SODIUM 200 MG/2ML IV SOLN
INTRAVENOUS | Status: AC
  Filled 2023-05-14: qty 2

## 2023-05-14 MED ORDER — 0.9 % SODIUM CHLORIDE (POUR BTL) OPTIME
TOPICAL | Status: DC | PRN
  Administered 2023-05-14: 2000 mL

## 2023-05-14 MED ORDER — ROCURONIUM BROMIDE 10 MG/ML (PF) SYRINGE
PREFILLED_SYRINGE | INTRAVENOUS | Status: AC
  Filled 2023-05-14: qty 10

## 2023-05-14 MED ORDER — HEPARIN 6000 UNIT IRRIGATION SOLUTION
Status: DC | PRN
  Administered 2023-05-14: 1

## 2023-05-14 MED ORDER — SODIUM CHLORIDE 0.9 % IV SOLN: INTRAVENOUS | Status: DC

## 2023-05-14 MED ORDER — CEFAZOLIN SODIUM-DEXTROSE 2-4 GM/100ML-% IV SOLN
2.0000 g | Freq: Three times a day (TID) | INTRAVENOUS | Status: AC
  Administered 2023-05-14 – 2023-05-15 (×2): 2 g via INTRAVENOUS
  Filled 2023-05-14: qty 100

## 2023-05-14 MED ORDER — FENTANYL CITRATE (PF) 250 MCG/5ML IJ SOLN
INTRAMUSCULAR | Status: AC
  Filled 2023-05-14: qty 5

## 2023-05-14 MED ORDER — POTASSIUM CHLORIDE CRYS ER 20 MEQ PO TBCR: 20.0000 meq | EXTENDED_RELEASE_TABLET | Freq: Every day | ORAL | Status: DC | PRN

## 2023-05-14 MED ORDER — ATROPINE SULFATE 0.4 MG/ML IV SOLN
INTRAVENOUS | Status: AC
  Filled 2023-05-14: qty 1

## 2023-05-14 SURGICAL SUPPLY — 46 items
APPLIER CLIP 9.375 SM OPEN (CLIP) ×1 IMPLANT
BAG BANDED W/RUBBER/TAPE 36X54 (MISCELLANEOUS) ×1 IMPLANT
BAG COUNTER SPONGE SURGICOUNT (BAG) ×1 IMPLANT
CANISTER SUCT 3000ML PPV (MISCELLANEOUS) ×1 IMPLANT
CATH BALLN ENROUTE 6X35 (CATHETERS) IMPLANT
CATH ROBINSON RED A/P 18FR (CATHETERS) IMPLANT
CLIP APPLIE 9.375 SM OPEN (CLIP) ×1 IMPLANT
CLIP TI MEDIUM 6 (CLIP) ×2 IMPLANT
CLIP TI WIDE RED SMALL 6 (CLIP) ×2 IMPLANT
COVER DOME SNAP 22 D (MISCELLANEOUS) ×1 IMPLANT
COVER PROBE W GEL 5X96 (DRAPES) ×1 IMPLANT
DERMABOND ADVANCED .7 DNX12 (GAUZE/BANDAGES/DRESSINGS) ×1 IMPLANT
DRAPE FEMORAL ANGIO 80X135IN (DRAPES) ×1 IMPLANT
ELECT REM PT RETURN 9FT ADLT (ELECTROSURGICAL) ×1 IMPLANT
ELECTRODE REM PT RTRN 9FT ADLT (ELECTROSURGICAL) ×1 IMPLANT
GOWN STRL REUS W/ TWL LRG LVL3 (GOWN DISPOSABLE) ×2 IMPLANT
GOWN STRL REUS W/TWL 2XL LVL3 (GOWN DISPOSABLE) ×2 IMPLANT
GUIDEWIRE ENROUTE 0.014 (WIRE) ×1 IMPLANT
HEMOSTAT SNOW SURGICEL 2X4 (HEMOSTASIS) IMPLANT
KIT BASIN OR (CUSTOM PROCEDURE TRAY) ×1 IMPLANT
KIT ENCORE 26 ADVANTAGE (KITS) ×1 IMPLANT
KIT INTRODUCER GALT 7 (INTRODUCER) ×1 IMPLANT
KIT TURNOVER KIT B (KITS) ×1 IMPLANT
NDL HYPO 25GX1X1/2 BEV (NEEDLE) IMPLANT
NEEDLE HYPO 25GX1X1/2 BEV (NEEDLE) IMPLANT
PACK CAROTID (CUSTOM PROCEDURE TRAY) ×1 IMPLANT
POSITIONER HEAD DONUT 9IN (MISCELLANEOUS) ×1 IMPLANT
PROTECTION STATION PRESSURIZED (MISCELLANEOUS) ×1 IMPLANT
SET MICROPUNCTURE 5F STIFF (MISCELLANEOUS) ×1 IMPLANT
STATION PROTECTION PRESSURIZED (MISCELLANEOUS) ×1 IMPLANT
STENT TRANSCAROTID SYS 10X40 (Permanent Stent) IMPLANT
SUT MNCRL AB 4-0 PS2 18 (SUTURE) ×1 IMPLANT
SUT PROLENE 5 0 C 1 24 (SUTURE) ×1 IMPLANT
SUT SILK 2 0 PERMA HAND 18 BK (SUTURE) IMPLANT
SUT SILK 2 0 SH (SUTURE) ×1 IMPLANT
SUT SILK 3-0 18XBRD TIE 12 (SUTURE) IMPLANT
SUT VIC AB 3-0 SH 27X BRD (SUTURE) ×1 IMPLANT
SYR 10ML LL (SYRINGE) ×3 IMPLANT
SYR 20ML LL LF (SYRINGE) ×1 IMPLANT
SYR CONTROL 10ML LL (SYRINGE) IMPLANT
SYSTEM TRANSCAROTID NEUROPRTCT (MISCELLANEOUS) ×1 IMPLANT
TOWEL GREEN STERILE (TOWEL DISPOSABLE) ×1 IMPLANT
TRANSCAROTID NEUROPROTECT SYS (MISCELLANEOUS) ×1 IMPLANT
WATER STERILE IRR 1000ML POUR (IV SOLUTION) ×1 IMPLANT
WIRE BENTSON .035X145CM (WIRE) ×1 IMPLANT
WIRE TORQFLEX AUST .018X40CM (WIRE) IMPLANT

## 2023-05-14 NOTE — Transfer of Care (Signed)
 Immediate Anesthesia Transfer of Care Note  Patient: Stephen Meza  Procedure(s) Performed: TRANSCAROTID ARTERY REVASCULARIZATION (TCAR) UISNG 10mm X 40mm ENROUTE TRANSCAROTID STENT SYSTEM (Right: Neck)  Patient Location: PACU  Anesthesia Type:General  Level of Consciousness: awake, alert , and oriented  Airway & Oxygen Therapy: Patient Spontanous Breathing and Patient connected to face mask oxygen  Post-op Assessment: Report given to RN and Post -op Vital signs reviewed and stable  Post vital signs: Reviewed and stable  Last Vitals:  Vitals Value Taken Time  BP 126/63 05/14/23 1232  Temp    Pulse 68 05/14/23 1235  Resp 21 05/14/23 1235  SpO2 100 % 05/14/23 1235  Vitals shown include unfiled device data.  Last Pain:  Vitals:   05/14/23 0846  TempSrc: Oral  PainSc:          Complications: No notable events documented.

## 2023-05-14 NOTE — Progress Notes (Signed)
  Progress Note    05/14/2023 9:47 AM * Day of Surgery *  Subjective:  no further stroke like symptoms   Vitals:   05/14/23 0323 05/14/23 0846  BP: (!) 183/83 (!) 153/71  Pulse: 64 62  Resp: 18 19  Temp: 98.4 F (36.9 C) 98.2 F (36.8 C)  SpO2: 97% 90%   Physical Exam: Lungs:  non labored Extremities:  moving all ext well, some left hand weakness Neurologic: CN grossly intact  CBC    Component Value Date/Time   WBC 6.4 05/14/2023 0758   RBC 3.85 (L) 05/14/2023 0758   HGB 11.1 (L) 05/14/2023 0758   HCT 34.4 (L) 05/14/2023 0758   PLT 240 05/14/2023 0758   MCV 89.4 05/14/2023 0758   MCH 28.8 05/14/2023 0758   MCHC 32.3 05/14/2023 0758   RDW 14.4 05/14/2023 0758   LYMPHSABS 1.0 05/09/2023 1654   MONOABS 0.6 05/09/2023 1654   EOSABS 0.0 05/09/2023 1654   BASOSABS 0.0 05/09/2023 1654    BMET    Component Value Date/Time   NA 136 05/14/2023 0758   K 3.9 05/14/2023 0758   CL 102 05/14/2023 0758   CO2 25 05/14/2023 0758   GLUCOSE 103 (H) 05/14/2023 0758   BUN 14 05/14/2023 0758   CREATININE 0.93 05/14/2023 0758   CALCIUM 9.1 05/14/2023 0758   GFRNONAA >60 05/14/2023 0758   GFRAA 122 09/08/2007 1631    INR    Component Value Date/Time   INR 1.0 05/14/2023 0900    No intake or output data in the 24 hours ending 05/14/23 0947   Assessment/Plan:  89 y.o. male with symptomatic right ICA stenosis  No further stroke like symptoms overnight; persistent slight weakness in L hand Continue aspirin, plavix, statin daily Plan is for right TCAR today.  Surgery was discussed in detail with the patient and his wife and all questions were answered.   Victorino Sparrow MD [facility] [phone] 05/14/2023 9:47 AM

## 2023-05-14 NOTE — Progress Notes (Signed)
 PHARMACIST LIPID MONITORING   Stephen Meza is a 89 y.o. male admitted on 05/09/2023 with Symptomatic right internal carotid artery stenosis s/p TCAR.  Pharmacy has been consulted to optimize lipid-lowering therapy with the indication of secondary prevention for clinical ASCVD.  Recent Labs:  Lipid Panel (last 6 months):   Lab Results  Component Value Date   CHOL 162 05/10/2023   TRIG 69 05/10/2023   HDL 53 05/10/2023   CHOLHDL 3.1 05/10/2023   VLDL 14 05/10/2023   LDLCALC 95 05/10/2023    Hepatic function panel (last 6 months):   Lab Results  Component Value Date   AST 24 05/10/2023   ALT 11 05/10/2023   ALKPHOS 78 05/10/2023   BILITOT 0.7 05/10/2023    SCr (since admission):   Serum creatinine: 0.93 mg/dL 57/84/69 6295 Estimated creatinine clearance: 59.2 mL/min  Current therapy and lipid therapy tolerance Current lipid-lowering therapy: atorvastatin 40mg  Previous lipid-lowering therapies (if applicable): none Documented or reported allergies or intolerances to lipid-lowering therapies (if applicable): none  Assessment:   Team started patient on appropriate statin therapy.  Plan:    1.Statin intensity (high intensity recommended for all patients regardless of the LDL):  No statin changes. The patient is already on a high intensity statin.  2.Add ezetimibe (if any one of the following):   Not indicated at this time.  3.Refer to lipid clinic:   No  4.Follow-up with:  Primary care provider - Moshe Cipro, FNP  5.Follow-up labs after discharge:  Changes in lipid therapy were made. Check a lipid panel in 8-12 weeks then annually.     Alphia Moh, PharmD, BCPS, BCCP Clinical Pharmacist  Please check AMION for all [facility] phone numbers After 10:00 PM, call Main Pharmacy [phone]

## 2023-05-14 NOTE — Progress Notes (Signed)
 PROGRESS NOTE  Stephen Meza VHQ:[id number] DOB: February 03, 1934 DOA: 05/09/2023 PCP: Moshe Cipro, FNP   LOS: 4 days   Brief narrative:  Stephen Meza is a 89 y.o. male with history of hypertension, gout, prior history of bladder cancer, lung nodule scheduled for lung biopsy 05/10/23, B12 deficiency neuropathy presented to the ED with intermittent weakness and numbness of the left side of his body for few weeks.  For last 3 days symptoms were pronounced and patient had some difficulty walking as well.  Labs show hemoglobin of 11.3 EKG shows normal sinus rhythm. MRI of the brain showed  multifocal acute infarct involving the right MCA and PCA territory.  CT angiogram head and neck showed right ICA stenosis with string sign.  Neurology on-call was consulted patient was placed on Plavix, aspirin and patient was admitted for further management of acute stroke.     Assessment/Plan: Principal Problem:   Acute CVA (cerebrovascular accident) [facility]) Active Problems:   Vitamin B12 deficiency   Essential hypertension   Anemia   History of gout   Stroke [facility])  Acute ischemic CVA  Significant right ICA stenosis Seen by neurology. Neurology recommended vascular surgery consultation for significant right ICA stenosis.  Vascular surgery has seen the patient at this time and recommend to continue aspirin statin and plan for TCAR on 05/14/2023.     Hypertension  IV hydralazine for SBP more than 220.  Latest blood pressure of 153/71   Chronic anemia with B12 deficiency.  Continue vitamin B12 supplements.   Latest vitamin B12 of 769, folate of 14.8, iron of 66 and TIBC 287, ferritin of 48.   History of gout on allopurinol.  Will continue   Lung nodule being followed by pulmonologist originally scheduled for biopsy later presentation but currently in the hospital.  Follow-up as outpatient.  Constipation.  Continue stool softener and MiraLAX.  DVT prophylaxis: enoxaparin (LOVENOX)  injection 40 mg Start: 05/10/23 1000   Disposition: Likely home with home health after surgical intervention.  Status is: Inpatient Remains inpatient appropriate because: Need for surgical intervention,    Code Status:     Code Status: Full Code  Family Communication: Spoke with the patient's spouse at bedside on 05/13/2023  Consultants:  neurology Vascular surgery  Procedures: None so far  Anti-infectives:  None  Anti-infectives (From admission, onward)    Start     Dose/Rate Route Frequency Ordered Stop   05/14/23 0822  ceFAZolin (ANCEF) IVPB 2g/100 mL premix        2 g 200 mL/hr over 30 Minutes Intravenous 30 min pre-op 05/14/23 4132 05/14/23 1044       Subjective: Today, patient was seen and examined at bedside.  Patient denies interval complaints.  Denies any dizziness lightheadedness nausea vomiting.  Awaiting for surgical intervention today   Objective: Vitals:   05/14/23 0800 05/14/23 0846  BP:  (!) 153/71  Pulse:  62  Resp:  19  Temp:  98.2 F (36.8 C)  SpO2: 94% 90%   No intake or output data in the 24 hours ending 05/14/23 1123  Filed Weights   05/09/23 1648 05/14/23 0846  Weight: 74.9 kg 74.8 kg   Body mass index is 22.38 kg/m.   Physical Exam:  GENERAL: Patient is alert awake and oriented. Not in obvious distress.  Elderly male, Communicative. HENT: No scleral pallor or icterus. Pupils equally reactive to light. Oral mucosa is moist NECK: is supple, no gross swelling noted. CHEST: Clear to auscultation. No crackles or  wheezes.   CVS: S1 and S2 heard, no murmur. Regular rate and rhythm.  ABDOMEN: Soft, non-tender, bowel sounds are present. EXTREMITIES: No edema. CNS: Mild left facial droop.  Mild weakness of the left arm and leg. SKIN: warm and dry without rashes.  Data Review: I have personally reviewed the following laboratory data and studies,  CBC: Recent Labs  Lab 05/09/23 1654 05/10/23 0939 05/11/23 0506 05/13/23 0658  05/14/23 0758  WBC 7.3 6.7 7.1 7.0 6.4  NEUTROABS 5.7  --   --   --   --   HGB 11.3* 11.6* 11.0* 10.6* 11.1*  HCT 34.8* 37.4* 34.8* 33.5* 34.4*  MCV 89.2 91.0 90.2 89.8 89.4  PLT 239 233 239 240 240   Basic Metabolic Panel: Recent Labs  Lab 05/09/23 1654 05/10/23 0939 05/11/23 0506 05/13/23 0658 05/14/23 0758  NA 136 133* 136 137 136  K 4.2 3.8 4.1 3.8 3.9  CL 101 99 103 103 102  CO2 25 23 25 25 25   GLUCOSE 106* 114* 114* 98 103*  BUN 15 12 12 12 14   CREATININE 0.88 0.80 0.93 1.01 0.93  CALCIUM 9.3 9.2 9.1 9.2 9.1  MG  --   --  2.2  --  2.0   Liver Function Tests: Recent Labs  Lab 05/09/23 1654 05/10/23 0939  AST 19 24  ALT 9 11  ALKPHOS 80 78  BILITOT 0.7 0.7  PROT 8.2* 8.2*  ALBUMIN 3.9 3.8   Recent Labs  Lab 05/09/23 1654  LIPASE 31   No results for input(s): "AMMONIA" in the last 168 hours. Cardiac Enzymes: No results for input(s): "CKTOTAL", "CKMB", "CKMBINDEX", "TROPONINI" in the last 168 hours. BNP (last 3 results) No results for input(s): "BNP" in the last 8760 hours.  ProBNP (last 3 results) No results for input(s): "PROBNP" in the last 8760 hours.  CBG: No results for input(s): "GLUCAP" in the last 168 hours. Recent Results (from the past 240 hours)  MRSA Next Gen by PCR, Nasal     Status: None   Collection Time: 05/13/23  6:28 PM   Specimen: Nasal Mucosa; Nasal Swab  Result Value Ref Range Status   MRSA by PCR Next Gen NOT DETECTED NOT DETECTED Final    Comment: (NOTE) The GeneXpert MRSA Assay (FDA approved for NASAL specimens only), is one component of a comprehensive MRSA colonization surveillance program. It is not intended to diagnose MRSA infection nor to guide or monitor treatment for MRSA infections. Test performance is not FDA approved in patients less than 57 years old. Performed at [facility], [address] [place], [place] [address]      Studies: HYBRID OR IMAGING (MC ONLY) Result Date: 05/14/2023 There is no  interpretation for this exam.  This order is for images obtained during a surgical procedure.  Please See "Surgeries" Tab for more information regarding the procedure.      Joycelyn Das, MD  [facility] 05/14/2023  If 7PM-7AM, please contact night-coverage

## 2023-05-14 NOTE — Op Note (Signed)
 NAME: Stephen Meza    MRN: [id number] DOB: 10/15/1933    DATE OF OPERATION: 05/14/2023  PREOP DIAGNOSIS:    Symptomatic right internal carotid artery stenosis  POSTOP DIAGNOSIS:    Same  PROCEDURE:    Right transcarotid artery revascularization-10x40 mm stent  SURGEON: Victorino Sparrow  ASSIST: Nathanial Rancher, PA  ANESTHESIA: General  EBL: 15 mL  INDICATIONS:    Geffrey A. Ickes is a 89 y.o. male who presented with stroke and MRI demonstrating right hemispheric lesions.  CTA demonstrated string sign in the right ICA.  After stress nursing benefits of right-sided transcarotid artery revascularization versus CEA, Nickoli elected to pursue transcarotid artery revascularization.  FINDINGS:   Patient unable to extend the neck and move left and right Severe stenosis greater than 95% of the right internal carotid artery  TECHNIQUE:   The patient was brought to the operating room, where support lines were placed and general anesthesia was secured. The right neck and right groin were prepped and the patient was sterilely draped. A transverse 2-4 cm incision was made between the sternal and clavicular heads of the sternocleidomastoid muscle, below the omohyoid. Following longitudinal division of the carotid sheath the jugular vein was partially dissected and retracted medially. Once 3 cm of common carotid artery (CCA) were isolated, umbilical tape was placed around the proximal 1/3 of the CCA under direct vision. A 5.0 polypropylene suture was pre-placed in the anterior wall of the CCA, in a "U stitch" configuration, close to the clavicle to facilitate hemostasis upon removal of the arterial sheath at completion of the TCAR procedure.  The contralateral (left) common femoral vein (CFV) was accessed under ultrasound guidance, using standard Seldinger and micropuncture access technique. Permanent recorded image(s) was/were saved in the patient's medical record. The Venous Return  Sheath was advanced into the CFV over the 0.035" wire provided. Blood was aspirated from the flow line followed by flushing of the Venous Sheath with heparinized saline. The Venous Sheath was secured to the patient's skin with suture to maintain optimal position in the vessel.  Heparin was given to obtain a therapeutic activated clotting time >250 seconds prior to arterial access. A 4-French non-stiffened ENHANCE Transcarotid / Peripheral Access set was used, puncturing the artery with the 21G needle through the pre-placed "U" stitch while holding gentle traction on the umbilical tape to stabilize and centralize the CCA within the incision. Careful attention was paid to the change in CCA shape when using the umbilical tape to control or lift the artery. The micropuncture wire was then advanced 3-4 cm into the CCA and, the 21G needle was removed. The micropuncture sheath was advanced 2-3 cm into the CCA and the wire and dilator were removed. Pulsatile backflow indicated correct positioning. The provided 0.035" J-tipped guidewire was inserted as close as possible to the bifurcation without engaging the lesion. After micropuncture sheath removal, the Transcarotid Arterial Sheath was advanced to the 2.5cm marker and the 0.035" wire and dilator were then removed. Arterial Sheath position was assessed under fluoroscopy in two projections to ensure that the sheath tip was oriented coaxially in the CCA. The Arterial Sheath was sutured to the patient with gentle forward tension. Blood was slowly aspirated followed by flushing with heparinized saline. No ingress of air bubbles through the passive hemostatic valve was observed. The stopcocks were closed. Traction applied to the CCA previously to facilitate access was gently released.  The Flow Controller was connected to the Transcarotid Arterial Sheath, prepared by  passively allowing a column of arterial blood to fill the line and connected to the Venous Return Sheath.  CCA inflow was occluded proximal to the arteriotomy with a vessel loop to achieve active flow reversal. To confirm flow reversal, a saline bolus was delivered into the venous flow line on both "High" and "Low" flow settings of the Flow Controller. Angiograms were performed with slow injections of a small amount of contrast filling just past the lesion to minimize antegrade transmission of micro-bubbles.  Prior to lesion manipulation, heart rate (70bpm) and systolic BP (140-156mmHg) were managed upwards to optimize flow reversal and procedural neuroprotection. The lesion was crossed with an 0.014" ENROUTE guidewire and pre-dilation of the lesion was performed with a 6mm x 30mm rapid exchange 0.014" compatible balloon catheter to 8 atmospheres for 10 seconds. Stenting was performed with an 10mm x 40mm ENROUTE Transcarotid stent, sized appropriately to the right CCA. AP and lateral angiograms (gentle contrast injections) were performed to confirm stent placement and arterial wall stent apposition.  At [facility] case completion, antegrade flow was restored by releasing the clamp on the CCA then closing the NPS stopcocks to the flow lines. The Transcarotid Arterial Sheath was removed and the pre-closure suture was tied. Heparin reversal was employed.  The wound bed was irrigated with copious amounts of saline and closed in layers using 3-0 Vicryl with Monocryl and Dermabond to the level of the skin.  The Venous Return Sheath was removed and hemostasis was achieved with brief manual compression.  The patient tolerated the procedure well and was extubated on the table. The patient was moving all four extremities to command prior to transfer to the recovery room.   Victorino Sparrow, MD [facility] DATE OF DICTATION:   05/14/2023

## 2023-05-14 NOTE — Progress Notes (Signed)
 PT Cancellation Note  Patient Details Name: Stephen Meza MRN: [id number] DOB: 08-Nov-1933   Cancelled Treatment:    Reason Eval/Treat Not Completed: Patient at procedure or test/unavailable; off the floor for TCAR procedure.  Will follow up another day.   Elray Mcgregor 05/14/2023, 9:44 AM Sheran Lawless, PT Acute Rehabilitation Services Office:[phone] 05/14/2023

## 2023-05-14 NOTE — Progress Notes (Signed)
  Progress Note    05/14/2023 8:16 AM * No surgery found *  Symptomatic right ICA stenosis  Scheduled for right TCAR by Dr. Karin Lieu today in OR Reviewed surgery with patient, his wife and daughters and answered there questions Keep NPO Consent ordered  Graceann Congress, PA-C [facility] [phone] 05/14/2023 8:16 AM

## 2023-05-14 NOTE — Anesthesia Procedure Notes (Signed)
 Procedure Name: Intubation Date/Time: 05/14/2023 10:34 AM  Performed by: Georgianne Fick D, CRNAPre-anesthesia Checklist: Patient identified, Emergency Drugs available, Suction available and Patient being monitored Patient Re-evaluated:Patient Re-evaluated prior to induction Oxygen Delivery Method: Circle System Utilized Preoxygenation: Pre-oxygenation with 100% oxygen Induction Type: IV induction Ventilation: Mask ventilation without difficulty Laryngoscope Size: Miller and 3 Grade View: Grade I Tube type: Oral Tube size: 7.5 mm Number of attempts: 1 Airway Equipment and Method: Stylet and Oral airway Placement Confirmation: ETT inserted through vocal cords under direct vision, positive ETCO2 and breath sounds checked- equal and bilateral Secured at: 20 cm Tube secured with: Tape Dental Injury: Teeth and Oropharynx as per pre-operative assessment

## 2023-05-14 NOTE — Anesthesia Procedure Notes (Signed)
 Arterial Line Insertion Start/End3/21/2025 9:20 AM Performed by: Elmer Picker, MD, Noah Delaine, CRNA, CRNA  Patient location: Pre-op. Preanesthetic checklist: patient identified, IV checked, site marked, risks and benefits discussed, surgical consent, monitors and equipment checked, pre-op evaluation, timeout performed and anesthesia consent Lidocaine 1% used for infiltration Left, radial was placed Catheter size: 20 G Hand hygiene performed  and maximum sterile barriers used   Attempts: 1 Procedure performed without using ultrasound guided technique. Following insertion, dressing applied. Post procedure assessment: normal and unchanged  Patient tolerated the procedure well with no immediate complications.

## 2023-05-15 ENCOUNTER — Other Ambulatory Visit (HOSPITAL_COMMUNITY): Payer: Self-pay

## 2023-05-15 DIAGNOSIS — I639 Cerebral infarction, unspecified: Secondary | ICD-10-CM | POA: Diagnosis not present

## 2023-05-15 LAB — BASIC METABOLIC PANEL
Anion gap: 9 (ref 5–15)
BUN: 16 mg/dL (ref 8–23)
CO2: 24 mmol/L (ref 22–32)
Calcium: 8.8 mg/dL — ABNORMAL LOW (ref 8.9–10.3)
Chloride: 102 mmol/L (ref 98–111)
Creatinine, Ser: 0.96 mg/dL (ref 0.61–1.24)
GFR, Estimated: >60 mL/min (ref >60)
Glucose, Bld: 103 mg/dL — ABNORMAL HIGH (ref 70–99)
Potassium: 3.7 mmol/L (ref 3.5–5.1)
Sodium: 135 mmol/L (ref 135–145)

## 2023-05-15 LAB — CBC
HCT: 30.3 % — ABNORMAL LOW (ref 39.0–52.0)
Hemoglobin: 9.6 g/dL — ABNORMAL LOW (ref 13.0–17.0)
MCH: 28.2 pg (ref 26.0–34.0)
MCHC: 31.7 g/dL (ref 30.0–36.0)
MCV: 89.1 fL (ref 80.0–100.0)
Platelets: 217 10*3/uL (ref 150–400)
RBC: 3.4 MIL/uL — ABNORMAL LOW (ref 4.22–5.81)
RDW: 14.3 % (ref 11.5–15.5)
WBC: 7.9 10*3/uL (ref 4.0–10.5)
nRBC: 0 % (ref 0.0–0.2)

## 2023-05-15 LAB — POCT ACTIVATED CLOTTING TIME
Activated Clotting Time: 250 s
Activated Clotting Time: 268 s
Activated Clotting Time: 147 s

## 2023-05-15 LAB — GLUCOSE, CAPILLARY: Glucose-Capillary: 109 mg/dL — ABNORMAL HIGH (ref 70–99)

## 2023-05-15 MED ORDER — CLOPIDOGREL BISULFATE 75 MG PO TABS
75.0000 mg | ORAL_TABLET | Freq: Every day | ORAL | 2 refills | Status: DC
  Filled 2023-05-15: qty 30, 30d supply, fill #0

## 2023-05-15 MED ORDER — POLYETHYLENE GLYCOL 3350 17 G PO PACK: 17.0000 g | PACK | Freq: Every day | ORAL | 0 refills | Status: DC | PRN

## 2023-05-15 MED ORDER — ATORVASTATIN CALCIUM 40 MG PO TABS: 40.0000 mg | ORAL_TABLET | Freq: Every day | ORAL | 2 refills | Status: DC

## 2023-05-15 MED ORDER — TRAMADOL HCL 50 MG PO TABS: 50.0000 mg | ORAL_TABLET | Freq: Four times a day (QID) | ORAL | 0 refills | Status: AC | PRN

## 2023-05-15 MED ORDER — CLOPIDOGREL BISULFATE 75 MG PO TABS: 75.0000 mg | ORAL_TABLET | Freq: Every day | ORAL | 2 refills | Status: DC

## 2023-05-15 MED ORDER — DOCUSATE SODIUM 100 MG PO CAPS: 100.0000 mg | ORAL_CAPSULE | Freq: Two times a day (BID) | ORAL | 2 refills | Status: DC

## 2023-05-15 MED ORDER — TRAMADOL HCL 50 MG PO TABS
50.0000 mg | ORAL_TABLET | Freq: Four times a day (QID) | ORAL | 0 refills | Status: DC | PRN
  Filled 2023-05-15: qty 15, 4d supply, fill #0

## 2023-05-15 MED ORDER — ATORVASTATIN CALCIUM 40 MG PO TABS
40.0000 mg | ORAL_TABLET | Freq: Every day | ORAL | 2 refills | Status: DC
Start: 2023-05-15 — End: 2023-05-15
  Filled 2023-05-15: qty 30, 30d supply, fill #0

## 2023-05-15 NOTE — Progress Notes (Addendum)
  Progress Note    05/15/2023 8:07 AM 1 Day Post-Op  Subjective: No complaints, denies any pain    Vitals:   05/15/23 0353 05/15/23 0600  BP: (!) 142/78 112/60  Pulse: 62 (!) 56  Resp: 16 17  Temp: 98.1 F (36.7 C) 97.8 F (36.6 C)  SpO2: 96% 97%    Physical Exam: General: Resting comfortably, alert and oriented x 4 Cardiac: Regular Lungs: Nonlabored Incisions: Right sided neck incision c/d/I without hematoma or bleeding Extremities: Palpable radial pulses bilaterally.  Neuro: No slurred speech or facial droop.  Minimal remaining left hand weakness  CBC    Component Value Date/Time   WBC 7.9 05/15/2023 0510   RBC 3.40 (L) 05/15/2023 0510   HGB 9.6 (L) 05/15/2023 0510   HCT 30.3 (L) 05/15/2023 0510   PLT 217 05/15/2023 0510   MCV 89.1 05/15/2023 0510   MCH 28.2 05/15/2023 0510   MCHC 31.7 05/15/2023 0510   RDW 14.3 05/15/2023 0510   LYMPHSABS 1.0 05/09/2023 1654   MONOABS 0.6 05/09/2023 1654   EOSABS 0.0 05/09/2023 1654   BASOSABS 0.0 05/09/2023 1654    BMET    Component Value Date/Time   NA 135 05/15/2023 0510   K 3.7 05/15/2023 0510   CL 102 05/15/2023 0510   CO2 24 05/15/2023 0510   GLUCOSE 103 (H) 05/15/2023 0510   BUN 16 05/15/2023 0510   CREATININE 0.96 05/15/2023 0510   CALCIUM 8.8 (L) 05/15/2023 0510   GFRNONAA >60 05/15/2023 0510   GFRAA 122 09/08/2007 1631    INR    Component Value Date/Time   INR 1.0 05/14/2023 0900     Intake/Output Summary (Last 24 hours) at 05/15/2023 1610 Last data filed at 05/15/2023 9604 Gross per 24 hour  Intake 1220 ml  Output 1475 ml  Net -255 ml      Assessment/Plan:  89 y.o. male is 1 day postop, s/p: Right TCAR   -The patient is doing well this morning after carotid stenting.  He denies any pain -He remains neurologically intact.  He has no new or worsening neurological symptoms.  He does have some residual left hand weakness from his stroke -Right sided neck incision intact and dry without  hematoma -Right groin cath site intact and dry without hematoma -Hemoglobin with slight drop at 9.6.  Likely due to surgery and hemodilution.  No signs of further blood loss -Creatinine at baseline.  Good urinary output at 1475 cc in 24 hours -He says he has gotten up and walked around without issue.  He is also tolerating a normal diet without swallowing difficulty -Continue aspirin, Plavix, and statin.  Okay for discharge today from vascular standpoint.  Will arrange follow-up with our office in 4 weeks with carotid duplex   Loel Dubonnet, PA-C [facility] [phone] 05/15/2023 8:07 AM  I have independently interviewed and examined patient and agree with PA assessment and plan above.   Elzie Sheets C. Randie Heinz, MD [facility] [place] Office: [phone] Pager: [phone]

## 2023-05-15 NOTE — Progress Notes (Signed)
 Pt has been DC. Pt needs DME (RW and 3-in-1 BSC) and HH PT/OT. Met with pt and wife. Discussed CMS Medicare.gov compare list. Pt agreed to use [facility]. She reports they have good ratings. They don't have a preference for a DME agency. Contacted Cheryl at [facility] and she accepted the referral. Will contact [employer] for DME referral and wife agreed. Contacted Jermaine at [employer] and he accepted the DME referral.

## 2023-05-15 NOTE — Anesthesia Postprocedure Evaluation (Signed)
 Anesthesia Post Note  Patient: Miraj A. Fawley  Procedure(s) Performed: TRANSCAROTID ARTERY REVASCULARIZATION (TCAR) UISNG 10mm X 40mm ENROUTE TRANSCAROTID STENT SYSTEM (Right: Neck)     Patient location during evaluation: PACU Anesthesia Type: General Level of consciousness: awake and alert Pain management: pain level controlled Vital Signs Assessment: post-procedure vital signs reviewed and stable Respiratory status: spontaneous breathing, nonlabored ventilation, respiratory function stable and patient connected to nasal cannula oxygen Cardiovascular status: blood pressure returned to baseline and stable Postop Assessment: no apparent nausea or vomiting Anesthetic complications: no  No notable events documented.  Last Vitals:  Vitals:   05/15/23 0600 05/15/23 0816  BP: 112/60 133/71  Pulse: (!) 56 60  Resp: 17   Temp: 36.6 C 36.9 C  SpO2: 97% 98%    Last Pain:  Vitals:   05/15/23 0816  TempSrc: Oral  PainSc:                  Kaleab Frasier L [place] Noori

## 2023-05-15 NOTE — Discharge Summary (Signed)
 Physician Discharge Summary  Stephen Meza ZOX:[id number] DOB: 1933-11-26 DOA: 05/09/2023  PCP: Moshe Cipro, FNP  Admit date: 05/09/2023 Discharge date: 05/15/2023  Admitted From: Home  Discharge disposition: Home with home health   Recommendations for Outpatient Follow-Up:   Follow up with your primary care provider in one week.  Check CBC, BMP, magnesium in the next visit Follow-up with vascular surgery in 4 weeks.  Office to schedule an appointment. Follow-up with your pulmonary specialist regarding lung nodule.   Discharge Diagnosis:   Principal Problem:   Acute CVA (cerebrovascular accident) Stephen Meza - [place]) Active Problems:   Vitamin B12 deficiency   Essential hypertension   Anemia   History of gout   Stroke [facility])   Discharge Condition: Improved.  Diet recommendation: Low sodium, heart healthy.   Wound care: None.  Code status: Full.   History of Present Illness:   Stephen Meza is a 89 y.o. male with history of hypertension, gout, prior history of bladder cancer, lung nodule scheduled for lung biopsy 05/10/23, B12 deficiency neuropathy presented to the ED with intermittent weakness and numbness of the left side of his body for few weeks.  For last 3 days symptoms were pronounced and patient had some difficulty walking as well.  Labs show hemoglobin of 11.3 EKG shows normal sinus rhythm. MRI of the brain showed  multifocal acute infarct involving the right MCA and PCA territory.  CT angiogram head and neck showed right ICA stenosis with string sign.  Neurology on-call was consulted patient was placed on Plavix, aspirin and patient was admitted for further management of acute stroke.   Hospital Course:   Following conditions were addressed during hospitalization as listed below,  Acute ischemic CVA  Significant right ICA stenosis Seen by neurology. Neurology recommended vascular surgery consultation for significant right ICA stenosis.  Vascular  surgery has seen the patient at this time and patient underwent TCAR on 05/14/2023.  Recommendation is to continue aspirin Plavix statin and follow-up with vascular surgery in 4 weeks.    Hypertension On amlodipine at home will be resumed at discharge.   Chronic anemia with B12 deficiency.  Continue vitamin B12 supplements.   Latest vitamin B12 of 769, folate of 14.8, iron of 66 and TIBC 287, ferritin of 48.   History of gout on allopurinol.  Will continue   Lung nodule being followed by pulmonologist originally scheduled for biopsy later presentation but currently in the hospital.  Follow-up as outpatient.   Constipation.  Continue stool softener on discharge.  Disposition.  At this time, patient is stable for disposition home with outpatient PCP, neurology and vascular surgery follow-up  Medical Consultants:   Vascular surgery Neurology  Procedures:    TCAR on 05/14/2023 Subjective:   Today, patient was seen and examined at bedside.  Feels okay.  Patient's spouse at bedside.  No nausea, vomiting fever chills or rigor.  Seen by vascular surgery today.  Discharge Exam:   Vitals:   05/15/23 0816 05/15/23 1038  BP: 133/71 133/71  Pulse: 60   Resp:  (!) 21  Temp: 98.5 F (36.9 C)   SpO2: 98%    Vitals:   05/15/23 0353 05/15/23 0600 05/15/23 0816 05/15/23 1038  BP: (!) 142/78 112/60 133/71 133/71  Pulse: 62 (!) 56 60   Resp: 16 17  (!) 21  Temp: 98.1 F (36.7 C) 97.8 F (36.6 C) 98.5 F (36.9 C)   TempSrc: Oral Oral Oral   SpO2: 96% 97% 98%   Weight:  Height:       Body mass index is 22.38 kg/m.  General: Alert awake, not in obvious distress, elderly, Communicative, ambulating in the room HENT: pupils equally reacting to light,  No scleral pallor or icterus noted. Oral mucosa is moist.  Chest: Diminished breath sounds bilaterally. No crackles or wheezes.  CVS: S1 &S2 heard. No murmur.  Regular rate and rhythm. Abdomen: Soft, nontender, nondistended.  Bowel  sounds are heard.   Extremities: No cyanosis, clubbing or edema.  Peripheral pulses are palpable. Psych: Alert, awake and oriented, normal mood CNS:  No cranial nerve deficits.  Mild left-sided weakness. Skin: Warm and dry.  No rashes noted.  The results of significant diagnostics from this hospitalization (including imaging, microbiology, ancillary and laboratory) are listed below for reference.     Diagnostic Studies:   CT ANGIO HEAD NECK W WO CM Result Date: 05/10/2023 CLINICAL DATA:  Stroke workup EXAM: CT ANGIOGRAPHY HEAD AND NECK WITH AND WITHOUT CONTRAST TECHNIQUE: Multidetector CT imaging of the head and neck was performed using the standard protocol during bolus administration of intravenous contrast. Multiplanar CT image reconstructions and MIPs were obtained to evaluate the vascular anatomy. Carotid stenosis measurements (when applicable) are obtained utilizing NASCET criteria, using the distal internal carotid diameter as the denominator. RADIATION DOSE REDUCTION: This exam was performed according to the departmental dose-optimization program which includes automated exposure control, adjustment of the mA and/or kV according to patient size and/or use of iterative reconstruction technique. CONTRAST:  75mL OMNIPAQUE IOHEXOL 350 MG/ML SOLN COMPARISON:  Brain MRI from earlier today FINDINGS: CT HEAD FINDINGS Brain: Acute infarcts by prior brain MRI are underestimated. No evidence of progression. No hemorrhage, hydrocephalus, or shift. Mild for age cerebral volume loss Vascular: See below Skull: No acute finding Sinuses/Orbits: No acute finding Review of the MIP images confirms the above findings CTA NECK FINDINGS Aortic arch: Atheromatous plaque which is extensive. Three vessel branching. No dissection or stenosis. Right carotid system: Diffuse atheromatous wall thickening with mixed density plaque at the bifurcation causing a short segment string sign over a 1 cm length, near the angle of the  mandible. No ulceration or beading. Left carotid system: Atheromatous wall thickening of the common carotid with mixed density plaque at the bifurcation. No flow reducing stenosis, ulceration, or beading. Vertebral arteries: No proximal subclavian stenosis. The left vertebral artery is strongly dominant and widely patent. Diminutive and relatively faintly enhancing right vertebral artery proximally but patent to the dura without visible beading or dissection. Skeleton: Generalized degenerative spurring without acute finding. Other neck: No acute finding Upper chest: Biapical subpleural fibrotic appearance. Review of the MIP images confirms the above findings CTA HEAD FINDINGS Anterior circulation: No major branch occlusion, beading, or proximal flow reducing stenosis. There is bilateral atheromatous calcification of the cavernous carotids. On thick MIPS, there is less density in the right MCA distribution diffusely. Negative for aneurysm. Posterior circulation: Left dominant vertebral artery. The diminutive right vertebral artery is patent with filling PICA. Atheromatous irregularity of the posterior cerebral arteries with moderate left P1 and advanced right P1/P2 stenoses. Venous sinuses: Unremarkable Review of the MIP images confirms the above findings IMPRESSION: 1. Severe atheromatous stenosis at the proximal right ICA with short segment string sign and intracranial relative underfilling. 2. Advanced stenosis in the right more than left proximal PCA. Electronically Signed   By: Tiburcio Pea M.D.   On: 05/10/2023 05:10   MR Brain W and Wo Contrast Result Date: 05/10/2023 CLINICAL DATA:  Acute neurologic  deficit EXAM: MRI HEAD WITHOUT AND WITH CONTRAST MRI CERVICAL SPINE WITHOUT AND WITH CONTRAST TECHNIQUE: Multiplanar, multiecho pulse sequences of the brain and surrounding structures, and cervical spine, to include the craniocervical junction and cervicothoracic junction, were obtained without and with  intravenous contrast. CONTRAST:  7mL GADAVIST GADOBUTROL 1 MMOL/ML IV SOLN COMPARISON:  None Available. FINDINGS: MRI HEAD FINDINGS Brain: Multifocal abnormal diffusion restriction within the right MCA and PCA territories, including the right precentral gyrus. Fewer than 5 scattered chronic microhemorrhages in a nonspecific pattern. There is multifocal hyperintense T2-weighted signal within the white matter. Parenchymal volume and CSF spaces are normal. Old left cerebellar infarct. The midline structures are normal. There is no abnormal contrast enhancement. Vascular: Attenuated right ICA flow void. Skull and upper cervical spine: Normal calvarium and skull base. Visualized upper cervical spine and soft tissues are normal. Sinuses/Orbits:No paranasal sinus fluid levels or advanced mucosal thickening. No mastoid or middle ear effusion. Normal orbits. MRI CERVICAL SPINE FINDINGS Alignment: Physiologic. Vertebrae: No fracture, evidence of discitis, or bone lesion. Cord: Normal signal and morphology. Posterior Fossa, vertebral arteries, paraspinal tissues: Abnormal proximal right vertebral artery V2 segment flow void. Disc levels: C1-2: Unremarkable. C2-3: Normal disc. Mild facet hypertrophy. There is no spinal canal stenosis. No neural foraminal stenosis. C3-4: Right uncovertebral hypertrophy. There is no spinal canal stenosis. Moderate right neural foraminal stenosis. C4-5: Right foraminal disc osteophyte complex. Mild spinal canal stenosis. Severe right neural foraminal stenosis. C5-6: Bilateral uncovertebral hypertrophy. There is no spinal canal stenosis. Moderate right and severe left neural foraminal stenosis. C6-7: Left uncovertebral spurring. There is no spinal canal stenosis. Severe left neural foraminal stenosis. C7-T1: Disc space narrowing without herniation. There is no spinal canal stenosis. No neural foraminal stenosis. IMPRESSION: 1. Multifocal acute infarcts within the right MCA and PCA territories,  including the right precentral gyrus. 2. Attenuated right ICA flow void at the skull base, likely stenosis. 3. Abnormal right vertebral artery V2 segment flow void, consistent with slow flow or occlusion. The distal right vertebral artery flow void is maintained. 4. Mild spinal canal stenosis and severe right neural foraminal stenosis at C4-5. 5. Moderate right and severe left C5-6 and C6-7 neural foraminal stenosis. Electronically Signed   By: Deatra Robinson M.D.   On: 05/10/2023 00:47   MR Cervical Spine W and Wo Contrast Result Date: 05/10/2023 CLINICAL DATA:  Acute neurologic deficit EXAM: MRI HEAD WITHOUT AND WITH CONTRAST MRI CERVICAL SPINE WITHOUT AND WITH CONTRAST TECHNIQUE: Multiplanar, multiecho pulse sequences of the brain and surrounding structures, and cervical spine, to include the craniocervical junction and cervicothoracic junction, were obtained without and with intravenous contrast. CONTRAST:  7mL GADAVIST GADOBUTROL 1 MMOL/ML IV SOLN COMPARISON:  None Available. FINDINGS: MRI HEAD FINDINGS Brain: Multifocal abnormal diffusion restriction within the right MCA and PCA territories, including the right precentral gyrus. Fewer than 5 scattered chronic microhemorrhages in a nonspecific pattern. There is multifocal hyperintense T2-weighted signal within the white matter. Parenchymal volume and CSF spaces are normal. Old left cerebellar infarct. The midline structures are normal. There is no abnormal contrast enhancement. Vascular: Attenuated right ICA flow void. Skull and upper cervical spine: Normal calvarium and skull base. Visualized upper cervical spine and soft tissues are normal. Sinuses/Orbits:No paranasal sinus fluid levels or advanced mucosal thickening. No mastoid or middle ear effusion. Normal orbits. MRI CERVICAL SPINE FINDINGS Alignment: Physiologic. Vertebrae: No fracture, evidence of discitis, or bone lesion. Cord: Normal signal and morphology. Posterior Fossa, vertebral arteries,  paraspinal tissues: Abnormal proximal right vertebral  artery V2 segment flow void. Disc levels: C1-2: Unremarkable. C2-3: Normal disc. Mild facet hypertrophy. There is no spinal canal stenosis. No neural foraminal stenosis. C3-4: Right uncovertebral hypertrophy. There is no spinal canal stenosis. Moderate right neural foraminal stenosis. C4-5: Right foraminal disc osteophyte complex. Mild spinal canal stenosis. Severe right neural foraminal stenosis. C5-6: Bilateral uncovertebral hypertrophy. There is no spinal canal stenosis. Moderate right and severe left neural foraminal stenosis. C6-7: Left uncovertebral spurring. There is no spinal canal stenosis. Severe left neural foraminal stenosis. C7-T1: Disc space narrowing without herniation. There is no spinal canal stenosis. No neural foraminal stenosis. IMPRESSION: 1. Multifocal acute infarcts within the right MCA and PCA territories, including the right precentral gyrus. 2. Attenuated right ICA flow void at the skull base, likely stenosis. 3. Abnormal right vertebral artery V2 segment flow void, consistent with slow flow or occlusion. The distal right vertebral artery flow void is maintained. 4. Mild spinal canal stenosis and severe right neural foraminal stenosis at C4-5. 5. Moderate right and severe left C5-6 and C6-7 neural foraminal stenosis. Electronically Signed   By: Deatra Robinson M.D.   On: 05/10/2023 00:47   CT HEAD WO CONTRAST ( ) Result Date: 05/09/2023 CLINICAL DATA:  Altered level of consciousness, intermittent left arm numbness for 1 month, left facial numbness since yesterday EXAM: CT HEAD WITHOUT CONTRAST TECHNIQUE: Contiguous axial images were obtained from the base of the skull through the vertex without intravenous contrast. RADIATION DOSE REDUCTION: This exam was performed according to the departmental dose-optimization program which includes automated exposure control, adjustment of the mA and/or kV according to patient size and/or use of  iterative reconstruction technique. COMPARISON:  09/12/2018 FINDINGS: Brain: There is diffuse cerebral atrophy, likely appropriate for age. Extensive hypodensities are seen throughout the periventricular white matter compatible with chronic small vessel ischemic changes. No evidence of acute infarct or hemorrhage. Lateral ventricles and midline structures are unremarkable. No acute extra-axial fluid collections. No mass effect. Vascular: No hyperdense vessel or unexpected calcification. Skull: Normal. Negative for fracture or focal lesion. Sinuses/Orbits: No acute finding. Other: None. IMPRESSION: 1. Chronic small-vessel ischemic changes and diffuse cerebral atrophy. 2. No acute intracranial process. Electronically Signed   By: Sharlet Salina M.D.   On: 05/09/2023 17:23     Labs:   Basic Metabolic Panel: Recent Labs  Lab 05/10/23 0939 05/11/23 0506 05/13/23 0658 05/14/23 0758 05/15/23 0510  NA 133* 136 137 136 135  K 3.8 4.1 3.8 3.9 3.7  CL 99 103 103 102 102  CO2 23 25 25 25 24   GLUCOSE 114* 114* 98 103* 103*  BUN 12 12 12 14 16   CREATININE 0.80 0.93 1.01 0.93 0.96  CALCIUM 9.2 9.1 9.2 9.1 8.8*  MG  --  2.2  --  2.0  --    GFR Estimated Creatinine Clearance: 57.4 mL/min (by C-G formula based on SCr of 0.96 mg/dL). Liver Function Tests: Recent Labs  Lab 05/09/23 1654 05/10/23 0939  AST 19 24  ALT 9 11  ALKPHOS 80 78  BILITOT 0.7 0.7  PROT 8.2* 8.2*  ALBUMIN 3.9 3.8   Recent Labs  Lab 05/09/23 1654  LIPASE 31   No results for input(s): "AMMONIA" in the last 168 hours. Coagulation profile Recent Labs  Lab 05/11/23 0506 05/14/23 0900  INR 1.0 1.0    CBC: Recent Labs  Lab 05/09/23 1654 05/10/23 0939 05/11/23 0506 05/13/23 0658 05/14/23 0758 05/15/23 0510  WBC 7.3 6.7 7.1 7.0 6.4 7.9  NEUTROABS 5.7  --   --   --   --   --  HGB 11.3* 11.6* 11.0* 10.6* 11.1* 9.6*  HCT 34.8* 37.4* 34.8* 33.5* 34.4* 30.3*  MCV 89.2 91.0 90.2 89.8 89.4 89.1  PLT 239 233 239  240 240 217   Cardiac Enzymes: No results for input(s): "CKTOTAL", "CKMB", "CKMBINDEX", "TROPONINI" in the last 168 hours. BNP: Invalid input(s): "POCBNP" CBG: Recent Labs  Lab 05/15/23 0820  GLUCAP 109*   D-Dimer No results for input(s): "DDIMER" in the last 72 hours. Hgb A1c No results for input(s): "HGBA1C" in the last 72 hours. Lipid Profile No results for input(s): "CHOL", "HDL", "LDLCALC", "TRIG", "CHOLHDL", "LDLDIRECT" in the last 72 hours. Thyroid function studies No results for input(s): "TSH", "T4TOTAL", "T3FREE", "THYROIDAB" in the last 72 hours.  Invalid input(s): "FREET3" Anemia work up No results for input(s): "VITAMINB12", "FOLATE", "FERRITIN", "TIBC", "IRON", "RETICCTPCT" in the last 72 hours. Microbiology Recent Results (from the past 240 hours)  MRSA Next Gen by PCR, Nasal     Status: None   Collection Time: 05/13/23  6:28 PM   Specimen: Nasal Mucosa; Nasal Swab  Result Value Ref Range Status   MRSA by PCR Next Gen NOT DETECTED NOT DETECTED Final    Comment: (NOTE) The GeneXpert MRSA Assay (FDA approved for NASAL specimens only), is one component of a comprehensive MRSA colonization surveillance program. It is not intended to diagnose MRSA infection nor to guide or monitor treatment for MRSA infections. Test performance is not FDA approved in patients less than 47 years old. Performed at [facility], [address] [place], [place] [address]      Discharge Instructions:   Discharge Instructions     Ambulatory referral to Neurology   Complete by: As directed    Follow up with stroke clinic NP at [facility] in about 4-6 weeks. Thanks.   Call MD for:  difficulty breathing, headache or visual disturbances   Complete by: As directed    Call MD for:  redness, tenderness, or signs of infection (pain, swelling, redness, odor or green/yellow discharge around incision site)   Complete by: As directed    Call MD for:  temperature >100.4   Complete by: As  directed    Diet - low sodium heart healthy   Complete by: As directed    Discharge instructions   Complete by: As directed    Follow-up with primary care provider in 1 week. Follow-up with Dr. Sherral Hammers vascular surgery as discussed by the clinic in 4 weeks.  Follow-up with neurology as scheduled by the clinic (ambulatory referral has been made).  Take medications as prescribed.  Follow-up with pulmonary for lung biopsy as scheduled by you.   Increase activity slowly   Complete by: As directed       Allergies as of 05/15/2023       Reactions   Nitrofuran Derivatives Rash   Ciprofloxacin Hcl Other (See Comments)   numbness        Medication List     TAKE these medications    allopurinol 100 MG tablet Commonly known as: ZYLOPRIM TAKE 1 TABLET BY MOUTH EVERY DAY   amLODipine 2.5 MG tablet Commonly known as: NORVASC Take 1 tablet (2.5 mg total) by mouth daily.   aspirin EC 81 MG tablet Take 81 mg by mouth daily. Swallow whole.   atorvastatin 40 MG tablet Commonly known as: LIPITOR Take 1 tablet (40 mg total) by mouth daily.   B-12 3000 MCG Caps Take 3,000 mcg by mouth daily.   clopidogrel 75 MG tablet Commonly known  as: PLAVIX Take 1 tablet (75 mg total) by mouth daily.   docusate sodium 100 MG capsule Commonly known as: COLACE Take 100 mg by mouth once.   LIPOFLAVONOID PO Take 2 capsules by mouth daily.   omeprazole 40 MG capsule Commonly known as: PRILOSEC TAKE 1 CAPSULE BY MOUTH EVERY DAY   polyethylene glycol 17 g packet Commonly known as: MiraLax Take 17 g by mouth daily as needed for moderate constipation or severe constipation (not helped with stool softner).   traMADol 50 MG tablet Commonly known as: ULTRAM Take 1 tablet (50 mg total) by mouth every 6 (six) hours as needed for up to 5 days for moderate pain (pain score 4-6) or severe pain (pain score 7-10).               Durable Medical Equipment  (From admission, onward)            Start     Ordered   05/11/23 1530  For home use only DME Bedside commode  Once       Question:  Patient needs a bedside commode to treat with the following condition  Answer:  Stroke [facility])   05/11/23 1529   05/11/23 1529  For home use only DME Walker rolling  Once       Question Answer Comment  Walker: With 5 Inch Wheels   Patient needs a walker to treat with the following condition Stroke [facility])      05/11/23 1529            Follow-up Information     [facility] .   Contact information: [address] [place] [address] [phone]        [facility]. Schedule an appointment as soon as possible for a visit in 1 month(s).   Specialty: Neurology Why: stroke clinic Contact information: [address] [place] [address] [phone]        Moshe Cipro, FNP Follow up in 1 week(s).   Specialty: Family Medicine Contact information: [address] [place] [address] [phone]         [facility] Follow up in 4 week(s).   Specialty: Vascular Surgery Why: Office will call to arrange your appt(s) Contact information: [address] [place] [address] [phone]                 Time coordinating discharge: 39 minutes  Signed:  Andreika Vandagriff  [facility] 05/15/2023, 2:44 PM

## 2023-05-16 ENCOUNTER — Encounter: Payer: Self-pay | Admitting: Orthopedic Surgery

## 2023-05-16 NOTE — Progress Notes (Signed)
 Office Visit Note   Patient: Stephen Meza           Date of Birth: 1933-03-11           MRN: [id number] Visit Date: 04/27/2023              Requested by: Corwin Levins, MD [address] [place],  [place] [address] PCP: Moshe Cipro, FNP  Chief Complaint  Patient presents with   Right Foot - Follow-up   Left Foot - Follow-up      HPI: Patient is an 89 year old gentleman is seen in follow-up for onychomycotic nails times time.  Assessment & Plan: Visit Diagnoses: No diagnosis found.  Plan: Nails were trimmed x 10 without complication.  Follow-Up Instructions: No follow-ups on file.   Ortho Exam  Patient is alert, oriented, no adenopathy, well-dressed, normal affect, normal respiratory effort. Examination patient has venous swelling but no open ulcers.  He has thickened discolored onychomycotic nails that he is unable to safely trim on his own and the nails were trimmed x 10 without complication.  Imaging: No results found. No images are attached to the encounter.  Labs: Lab Results  Component Value Date   HGBA1C 5.5 05/10/2023   HGBA1C 6.0 11/06/2022   HGBA1C 5.8 05/08/2022   ESRSEDRATE 19 08/01/2018   LABURIC 5.0 11/28/2012   LABURIC 6.4 03/10/2011   LABURIC 6.8 02/12/2010     Lab Results  Component Value Date   ALBUMIN 3.8 05/10/2023   ALBUMIN 3.9 05/09/2023   ALBUMIN 4.2 03/10/2023    Lab Results  Component Value Date   MG 2.0 05/14/2023   MG 2.2 05/11/2023   Lab Results  Component Value Date   VD25OH 61.85 11/06/2022   VD25OH 46.83 05/08/2022   VD25OH 47.18 04/22/2021    No results found for: "PREALBUMIN"    Latest Ref Rng & Units 05/15/2023    5:10 AM 05/14/2023    7:58 AM 05/13/2023    6:58 AM  CBC EXTENDED  WBC 4.0 - 10.5 K/uL 7.9  6.4  7.0   RBC 4.22 - 5.81 MIL/uL 3.40  3.85  3.73   Hemoglobin 13.0 - 17.0 g/dL 9.6  19.1  47.8   HCT 29.5 - 52.0 % 30.3  34.4  33.5   Platelets 150 - 400 K/uL 217  240  240       There is no height or weight on file to calculate BMI.  Orders:  No orders of the defined types were placed in this encounter.  No orders of the defined types were placed in this encounter.    Procedures: No procedures performed  Clinical Data: No additional findings.  ROS:  All other systems negative, except as noted in the HPI. Review of Systems  Objective: Vital Signs: There were no vitals taken for this visit.  Specialty Comments:  No specialty comments available.  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   Low grade fever 04/06/2019   Bilateral leg pain 04/04/2018   Bilateral bunions 04/04/2018   Ganglion cyst of dorsum of right wrist 04/04/2018   Chronic low back pain  04/04/2018   Gait disorder 04/04/2018   Recurrent falls while walking 04/04/2018   Abrasion of knee, bilateral 04/04/2018   Bladder cancer (HCC) 03/19/2016   Esophageal stricture 03/19/2016   Retinal tear of right eye 03/19/2016   Dizziness 03/19/2016   Hyperglycemia 03/16/2015   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   Prostate cancer (HCC) 04/13/2012   Hyperlipidemia with target LDL less than 130 04/12/2012   Encounter for well adult exam with abnormal findings 03/11/2011   TOBACCO ABUSE, HX OF 02/12/2010   SHOULDER PAIN 10/10/2007   Vitamin B12 deficiency 07/14/2007   PODAGRA 07/14/2007   ERECTILE DYSFUNCTION 07/14/2007   TREMOR, ESSENTIAL 07/14/2007   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   Essential hypertension 07/14/2007   PERIPHERAL VASCULAR DISEASE 07/14/2007    External hemorrhoids 07/14/2007   Esophagitis 07/14/2007   GERD 07/14/2007   TONSILLECTOMY, HX OF 07/14/2007   Past Medical History:  Diagnosis Date   DUODENITIS, WITHOUT HEMORRHAGE 07/14/2007   ERECTILE DYSFUNCTION 07/14/2007   Esophageal stricture 03/19/2016   ESOPHAGITIS 07/14/2007   EXTERNAL HEMORRHOIDS 07/14/2007   GERD 07/14/2007   Gout    HYPERTENSION 07/14/2007   PERIPHERAL NEUROPATHY 07/14/2007   Peripheral neuropathy 04/06/2019   PERIPHERAL VASCULAR DISEASE 07/14/2007   PODAGRA 07/14/2007   Prostate cancer (HCC) 02/2012   pt had screening and then biopsy   Retinal tear of right eye 03/19/2016   TOBACCO ABUSE, HX OF 02/12/2010   TONSILLECTOMY, HX OF 07/14/2007   TREMOR, ESSENTIAL 07/14/2007   VITAMIN B12 DEFICIENCY 07/14/2007    Family History  Problem Relation Age of Onset   Hypothyroidism Daughter    Breast cancer Other    Cancer Mother    Other Father    Breast cancer Sister     Past Surgical History:  Procedure Laterality Date   Excision of lipoma     PROSTATE BIOPSY Bilateral 03/10/2012   surgery to reduce turbinate and straighten deviated septum  2016   TONSILLECTOMY     Social History   Occupational History   Occupation: retired    Associate Professor: RETIRED  Tobacco Use   Smoking status: Former    Current packs/day: 0.75    Average packs/day: 0.8 packs/day for 29.2 years (21.9 ttl pk-yrs)    Types: Cigarettes    Start date: 02/23/1994   Smokeless tobacco: Former  Building services engineer status: Never Used  Substance and Sexual Activity   Alcohol use: Not Currently    Comment: d/c beer about 5 months ago   Drug use: No   Sexual activity: Yes    Partners: Female

## 2023-05-17 ENCOUNTER — Telehealth: Payer: Self-pay

## 2023-05-17 ENCOUNTER — Encounter (HOSPITAL_COMMUNITY): Payer: Self-pay | Admitting: Vascular Surgery

## 2023-05-17 ENCOUNTER — Telehealth: Payer: Self-pay | Admitting: Vascular Surgery

## 2023-05-17 ENCOUNTER — Telehealth: Payer: Self-pay | Admitting: Emergency Medicine

## 2023-05-17 NOTE — Telephone Encounter (Signed)
Pt appointment scheduled.

## 2023-05-17 NOTE — Telephone Encounter (Signed)
 Patient had a stroke so he was unable to attend his bronch that was scheduled. He canceled the follow up appointment since he did not have the procedure. Wife and patient would like a call back at [phone] or [phone]

## 2023-05-17 NOTE — Transitions of Care (Post Inpatient/ED Visit) (Signed)
 05/17/2023  Name: Stephen Meza MRN: [id number] DOB: Nov 06, 1933  Today's TOC FU Call Status: Today's TOC FU Call Status:: Successful TOC FU Call Completed TOC FU Call Complete Date: 05/17/23 Patient's Name and Date of Birth confirmed.  Transition Care Management Follow-up Telephone Call Date of Discharge: 05/15/23 Discharge Facility: [facility]) Type of Discharge: Inpatient Admission Primary Inpatient Discharge Diagnosis:: cerebral infarction How have you been since you were released from the hospital?: Better Any questions or concerns?: No  Items Reviewed: Did you receive and understand the discharge instructions provided?: Yes Medications obtained,verified, and reconciled?: Yes (Medications Reviewed) Any new allergies since your discharge?: No Dietary orders reviewed?: Yes Do you have support at home?: Yes People in Home: child(ren), adult, spouse  Medications Reviewed Today: Medications Reviewed Today     Reviewed by Stephen Addison, LPN (Licensed Practical Nurse) on 05/17/23 at 1030  Med List Status: <None>   Medication Order Taking? Sig Documenting Provider Last Dose Status Informant  allopurinol (ZYLOPRIM) 100 MG tablet [id number] No TAKE 1 TABLET BY MOUTH EVERY DAY Nadara Mustard, MD 05/09/2023 Morning Active Spouse/Significant Other  amLODipine (NORVASC) 2.5 MG tablet [id number] No Take 1 tablet (2.5 mg total) by mouth daily. Corwin Levins, MD 05/09/2023 Morning Active Spouse/Significant Other  aspirin EC 81 MG tablet [id number] No Take 81 mg by mouth daily. Swallow whole.  Patient not taking: Reported on 05/09/2023   [provider] Not Taking Active Spouse/Significant Other  atorvastatin (LIPITOR) 40 MG tablet [id number]  Take 1 tablet (40 mg total) by mouth daily. Pokhrel, Laxman, MD  Active   clopidogrel (PLAVIX) 75 MG tablet [id number]  Take 1 tablet (75 mg total) by mouth daily. Pokhrel, Laxman, MD  Active   Cyanocobalamin (B-12) 3000 MCG CAPS  [id number] No Take 3,000 mcg by mouth daily. [provider] 05/09/2023 Morning Active Spouse/Significant Other  docusate sodium (COLACE) 100 MG capsule [id number] No Take 100 mg by mouth once. [provider] 05/08/2023 Active Spouse/Significant Other  omeprazole (PRILOSEC) 40 MG capsule [id number] No TAKE 1 CAPSULE BY MOUTH EVERY DAY Corwin Levins, MD 05/09/2023 Morning Active Spouse/Significant Other  polyethylene glycol (MIRALAX) 17 g packet [id number]  Take 17 g by mouth daily as needed for moderate constipation or severe constipation (not helped with stool softner). Pokhrel, Rebekah Chesterfield, MD  Active   traMADol (ULTRAM) 50 MG tablet [id number]  Take 1 tablet (50 mg total) by mouth every 6 (six) hours as needed for up to 5 days for moderate pain (pain score 4-6) or severe pain (pain score 7-10). Pokhrel, Laxman, MD  Active   Vitamins-Lipotropics (LIPOFLAVONOID PO) [id number] No Take 2 capsules by mouth daily. [provider] 05/09/2023 Morning Active Spouse/Significant Other            Home Care and Equipment/Supplies: Were Home Health Services Ordered?: Yes Name of Home Health Agency:: [facility] Has Agency set up a time to come to your home?: Yes First Home Health Visit Date: 05/18/23 Any new equipment or medical supplies ordered?: Yes Name of Medical supply agency?: unknown Were you able to get the equipment/medical supplies?: Yes Do you have any questions related to the use of the equipment/supplies?: No  Functional Questionnaire: Do you need assistance with bathing/showering or dressing?: Yes Do you need assistance with meal preparation?: Yes Do you need assistance with eating?: No Do you have difficulty maintaining continence: No Do you need assistance with getting out of bed/getting out of a chair/moving?: Yes Do you have difficulty managing or  taking your medications?: Yes  Follow up appointments reviewed: PCP Follow-up appointment confirmed?: Yes Date of PCP  follow-up appointment?: 05/19/23 Follow-up Provider: [facility] Follow-up appointment confirmed?: No Reason Specialist Follow-Up Not Confirmed: Patient has Specialist Provider Number and will Call for Appointment Do you need transportation to your follow-up appointment?: No Do you understand care options if your condition(s) worsen?: Yes-patient verbalized understanding    SIGNATURE Stephen Addison, LPN [facility] Nurse Health Advisor Direct Dial [phone]

## 2023-05-18 ENCOUNTER — Ambulatory Visit: Payer: Medicare Other | Admitting: Family Medicine

## 2023-05-18 DIAGNOSIS — Z8551 Personal history of malignant neoplasm of bladder: Secondary | ICD-10-CM | POA: Diagnosis not present

## 2023-05-18 DIAGNOSIS — Z7902 Long term (current) use of antithrombotics/antiplatelets: Secondary | ICD-10-CM | POA: Diagnosis not present

## 2023-05-18 DIAGNOSIS — M1990 Unspecified osteoarthritis, unspecified site: Secondary | ICD-10-CM | POA: Diagnosis not present

## 2023-05-18 DIAGNOSIS — G720 Drug-induced myopathy: Secondary | ICD-10-CM | POA: Diagnosis not present

## 2023-05-18 DIAGNOSIS — D519 Vitamin B12 deficiency anemia, unspecified: Secondary | ICD-10-CM | POA: Diagnosis not present

## 2023-05-18 DIAGNOSIS — M4802 Spinal stenosis, cervical region: Secondary | ICD-10-CM | POA: Diagnosis not present

## 2023-05-18 DIAGNOSIS — M109 Gout, unspecified: Secondary | ICD-10-CM | POA: Diagnosis not present

## 2023-05-18 DIAGNOSIS — R911 Solitary pulmonary nodule: Secondary | ICD-10-CM | POA: Diagnosis not present

## 2023-05-18 DIAGNOSIS — Z48812 Encounter for surgical aftercare following surgery on the circulatory system: Secondary | ICD-10-CM | POA: Diagnosis not present

## 2023-05-18 DIAGNOSIS — I499 Cardiac arrhythmia, unspecified: Secondary | ICD-10-CM | POA: Diagnosis not present

## 2023-05-18 DIAGNOSIS — I69354 Hemiplegia and hemiparesis following cerebral infarction affecting left non-dominant side: Secondary | ICD-10-CM | POA: Diagnosis not present

## 2023-05-18 DIAGNOSIS — I10 Essential (primary) hypertension: Secondary | ICD-10-CM | POA: Diagnosis not present

## 2023-05-18 NOTE — Telephone Encounter (Signed)
 Spoke with Stephen Meza pts wife (DPR). Pt had stroke 3/16 and had stent put in. Had to cancel Bronchoscopy procedure. Bronch f/u was cancelled due to pts health conditions. Stephen Meza is concerned because he will need the biopsy done later on. Pts wife has my chart and was notified of results from [facility] chest port 3/21, she is concerned after reading the results on his  chart and would like to know what they mean. Please advise Dr. Delton Coombes

## 2023-05-19 ENCOUNTER — Ambulatory Visit: Admitting: Emergency Medicine

## 2023-05-19 ENCOUNTER — Inpatient Hospital Stay: Admitting: Family Medicine

## 2023-05-19 NOTE — Telephone Encounter (Signed)
 We need to get him in to be seen by either RB or SG to rediscuss his scans and to determine when it is safe for him to be off his aspirin and Plavix following his stroke and stent placement.  We will not be able to do the bronchoscopy until it is safe to be off these medications.

## 2023-05-20 DIAGNOSIS — I499 Cardiac arrhythmia, unspecified: Secondary | ICD-10-CM | POA: Diagnosis not present

## 2023-05-20 DIAGNOSIS — Z8551 Personal history of malignant neoplasm of bladder: Secondary | ICD-10-CM | POA: Diagnosis not present

## 2023-05-20 DIAGNOSIS — I10 Essential (primary) hypertension: Secondary | ICD-10-CM | POA: Diagnosis not present

## 2023-05-20 DIAGNOSIS — I69354 Hemiplegia and hemiparesis following cerebral infarction affecting left non-dominant side: Secondary | ICD-10-CM | POA: Diagnosis not present

## 2023-05-20 DIAGNOSIS — G720 Drug-induced myopathy: Secondary | ICD-10-CM | POA: Diagnosis not present

## 2023-05-20 DIAGNOSIS — Z48812 Encounter for surgical aftercare following surgery on the circulatory system: Secondary | ICD-10-CM | POA: Diagnosis not present

## 2023-05-20 DIAGNOSIS — M109 Gout, unspecified: Secondary | ICD-10-CM | POA: Diagnosis not present

## 2023-05-20 DIAGNOSIS — Z7902 Long term (current) use of antithrombotics/antiplatelets: Secondary | ICD-10-CM | POA: Diagnosis not present

## 2023-05-20 DIAGNOSIS — M1990 Unspecified osteoarthritis, unspecified site: Secondary | ICD-10-CM | POA: Diagnosis not present

## 2023-05-20 DIAGNOSIS — M4802 Spinal stenosis, cervical region: Secondary | ICD-10-CM | POA: Diagnosis not present

## 2023-05-20 DIAGNOSIS — R911 Solitary pulmonary nodule: Secondary | ICD-10-CM | POA: Diagnosis not present

## 2023-05-20 DIAGNOSIS — D519 Vitamin B12 deficiency anemia, unspecified: Secondary | ICD-10-CM | POA: Diagnosis not present

## 2023-05-20 NOTE — Telephone Encounter (Signed)
 Spoke with [facility]) regarding RB previous note. Pts wife informed me they have an appt w/ surgeon in 6 wks to determine when it will  be safe to be off meds. Pt is having an ultrasound as well to see how he is doing after having stroke & stent.  Pts wife is going to wait on scheduling an appt with our office. She is going to keep our office in touch with info from appt with surgeon and we will schedule based off how pt is doing. FYI Dr. Delton Coombes

## 2023-05-21 ENCOUNTER — Encounter: Payer: Self-pay | Admitting: Family Medicine

## 2023-05-21 ENCOUNTER — Ambulatory Visit: Admitting: Family Medicine

## 2023-05-21 ENCOUNTER — Telehealth: Payer: Self-pay | Admitting: Family Medicine

## 2023-05-21 VITALS — BP 110/68 | HR 100 | Temp 98.6°F | Ht 72.0 in

## 2023-05-21 DIAGNOSIS — I69354 Hemiplegia and hemiparesis following cerebral infarction affecting left non-dominant side: Secondary | ICD-10-CM

## 2023-05-21 DIAGNOSIS — T466X5A Adverse effect of antihyperlipidemic and antiarteriosclerotic drugs, initial encounter: Secondary | ICD-10-CM | POA: Diagnosis not present

## 2023-05-21 DIAGNOSIS — G720 Drug-induced myopathy: Secondary | ICD-10-CM

## 2023-05-21 DIAGNOSIS — N401 Enlarged prostate with lower urinary tract symptoms: Secondary | ICD-10-CM

## 2023-05-21 DIAGNOSIS — I679 Cerebrovascular disease, unspecified: Secondary | ICD-10-CM

## 2023-05-21 DIAGNOSIS — N138 Other obstructive and reflux uropathy: Secondary | ICD-10-CM

## 2023-05-21 DIAGNOSIS — I10 Essential (primary) hypertension: Secondary | ICD-10-CM

## 2023-05-21 LAB — CK: Total CK: 105 U/L (ref 7–232)

## 2023-05-21 LAB — COMPREHENSIVE METABOLIC PANEL WITH GFR
ALT: 8 U/L (ref 0–53)
AST: 18 U/L (ref 0–37)
Albumin: 4.3 g/dL (ref 3.5–5.2)
Alkaline Phosphatase: 77 U/L (ref 39–117)
BUN: 20 mg/dL (ref 6–23)
CO2: 30 meq/L (ref 19–32)
Calcium: 10 mg/dL (ref 8.4–10.5)
Chloride: 99 meq/L (ref 96–112)
Creatinine, Ser: 0.81 mg/dL (ref 0.40–1.50)
GFR: 79.05 mL/min (ref >60.00)
Glucose, Bld: 107 mg/dL — ABNORMAL HIGH (ref 70–99)
Potassium: 4.7 meq/L (ref 3.5–5.1)
Sodium: 136 meq/L (ref 135–145)
Total Bilirubin: 0.5 mg/dL (ref 0.2–1.2)
Total Protein: 8.1 g/dL (ref 6.0–8.3)

## 2023-05-21 LAB — CBC WITH DIFFERENTIAL/PLATELET
Basophils Absolute: 0 10*3/uL (ref 0.0–0.1)
Basophils Relative: 0.5 % (ref 0.0–3.0)
Eosinophils Absolute: 0.1 10*3/uL (ref 0.0–0.7)
Eosinophils Relative: 1.1 % (ref 0.0–5.0)
HCT: 36.2 % — ABNORMAL LOW (ref 39.0–52.0)
Hemoglobin: 11.8 g/dL — ABNORMAL LOW (ref 13.0–17.0)
Lymphocytes Relative: 11.1 % — ABNORMAL LOW (ref 12.0–46.0)
Lymphs Abs: 1 10*3/uL (ref 0.7–4.0)
MCHC: 32.6 g/dL (ref 30.0–36.0)
MCV: 89.2 fl (ref 78.0–100.0)
Monocytes Absolute: 0.7 10*3/uL (ref 0.1–1.0)
Monocytes Relative: 8.6 % (ref 3.0–12.0)
Neutro Abs: 6.9 10*3/uL (ref 1.4–7.7)
Neutrophils Relative %: 78.7 % — ABNORMAL HIGH (ref 43.0–77.0)
Platelets: 338 10*3/uL (ref 150.0–400.0)
RBC: 4.06 Mil/uL — ABNORMAL LOW (ref 4.22–5.81)
RDW: 15.4 % (ref 11.5–15.5)
WBC: 8.7 10*3/uL (ref 4.0–10.5)

## 2023-05-21 LAB — MAGNESIUM: Magnesium: 2.2 mg/dL (ref 1.5–2.5)

## 2023-05-21 LAB — URINALYSIS, ROUTINE W REFLEX MICROSCOPIC
Bilirubin Urine: NEGATIVE
Hgb urine dipstick: NEGATIVE
Ketones, ur: NEGATIVE
Nitrite: NEGATIVE
Specific Gravity, Urine: 1.02 (ref 1.000–1.030)
Total Protein, Urine: NEGATIVE
Urine Glucose: NEGATIVE
Urobilinogen, UA: 1 (ref 0.0–1.0)
pH: 6 (ref 5.0–8.0)

## 2023-05-21 MED ORDER — OXYBUTYNIN CHLORIDE ER 5 MG PO TB24: 5.0000 mg | ORAL_TABLET | Freq: Every day | ORAL | 1 refills | Status: DC

## 2023-05-21 MED ORDER — ROSUVASTATIN CALCIUM 20 MG PO TABS
20.0000 mg | ORAL_TABLET | Freq: Every day | ORAL | 1 refills | Status: DC
Start: 2023-05-21 — End: 2023-06-09

## 2023-05-21 NOTE — Telephone Encounter (Signed)
 Copied from CRM [id number]. Topic: Clinical - Home Health Verbal Orders >> May 21, 2023  9:53 AM Elizebeth Brooking wrote: Caller/Agency: [facility] Number: [phone] Service Requested: Physical Therapy Frequency: 2 a week for 3 weeks and 1 a week for 5 weeks  Any new concerns about the patient? No

## 2023-05-21 NOTE — Patient Instructions (Signed)
 STOP atorvastatin  START rosuvastatin 20mg  once daily in the evenings  START oxybutynin 5mg  as needed at night  Keep your follow up appt with me in a month  We are checking labs today, will be in contact with any results that require further attention

## 2023-05-21 NOTE — Progress Notes (Signed)
 Acute Office Visit  Subjective:     Patient ID: Stephen Meza, male    DOB: December 30, 1933, 89 y.o.   MRN: [id number]  Chief Complaint  Patient presents with   Hospitalization Follow-up    HPI Follow up Hospitalization  Patient was admitted to [facility] hospital on 05/09/23 and discharged on 05/15/23 for left sided facial, L upper and lower extremity weakness and numbness. He was treated for Acute ischemic stroke. Treatment for this included carotid endarterectomy with stent placement, addition of plavix. Telephone follow up was done on 05/17/23 He reports excellent compliance with treatment. He reports this condition is improved. Has f/u with [facility] Neurology on 06/16/23. Was scheduled to have lung nodule biopsied and was unable to do so while he was hospitalized. Reports muscle pain, was started on atorvastatin in the hospital. Inquiring if this could be the cause. Has neuro f/u 06/16/23 Receiving in home PT/OT x 2/wk Will rescheduled lung bx with Dr Delton Coombes when plavix course is completed. Denies other concerns.      ROS Per HPI      Objective:    BP 110/68 (BP Location: Left Arm, Patient Position: Sitting)   Pulse 100   Temp 98.6 F (37 C) (Temporal)   Ht 6' (1.829 m)   SpO2 97%   BMI 22.38 kg/m    Physical Exam Vitals and nursing note reviewed.  Constitutional:      General: He is not in acute distress.    Appearance: Normal appearance.     Comments: Appears fatigued  HENT:     Head: Normocephalic and atraumatic.     Right Ear: External ear normal.     Left Ear: External ear normal.     Nose: Nose normal.     Mouth/Throat:     Mouth: Mucous membranes are moist.     Pharynx: Oropharynx is clear.  Eyes:     Extraocular Movements: Extraocular movements intact.  Cardiovascular:     Rate and Rhythm: Normal rate and regular rhythm.     Pulses: Normal pulses.     Heart sounds: Normal heart sounds.  Pulmonary:     Effort: Pulmonary effort is normal. No  respiratory distress.     Breath sounds: Normal breath sounds. No wheezing, rhonchi or rales.  Musculoskeletal:     Cervical back: Normal range of motion.     Right lower leg: No edema.     Left lower leg: No edema.     Comments: Generalized muscle weakness   Lymphadenopathy:     Cervical: No cervical adenopathy.  Skin:    General: Skin is warm and dry.  Neurological:     Mental Status: He is alert and oriented to person, place, and time.     Sensory: Sensory deficit present.     Motor: Weakness present.     Coordination: Coordination abnormal.     Gait: Gait abnormal.     Comments: Using walker today, moving slowly. Unsteady gait.   Psychiatric:        Mood and Affect: Mood normal.        Behavior: Behavior normal.    Results for orders placed or performed in visit on 05/21/23  Urine Culture   Specimen: Blood  Result Value Ref Range   Source: NOT GIVEN    Status: FINAL    Result: No Growth   CBC with Differential/Platelet  Result Value Ref Range   WBC 8.7 4.0 - 10.5 K/uL   RBC 4.06 (L) 4.22 -  5.81 Mil/uL   Hemoglobin 11.8 (L) 13.0 - 17.0 g/dL   HCT 28.4 (L) 13.2 - 44.0 %   MCV 89.2 78.0 - 100.0 fl   MCHC 32.6 30.0 - 36.0 g/dL   RDW 10.2 72.5 - 36.6 %   Platelets 338.0 150.0 - 400.0 K/uL   Neutrophils Relative % 78.7 (H) 43.0 - 77.0 %   Lymphocytes Relative 11.1 (L) 12.0 - 46.0 %   Monocytes Relative 8.6 3.0 - 12.0 %   Eosinophils Relative 1.1 0.0 - 5.0 %   Basophils Relative 0.5 0.0 - 3.0 %   Neutro Abs 6.9 1.4 - 7.7 K/uL   Lymphs Abs 1.0 0.7 - 4.0 K/uL   Monocytes Absolute 0.7 0.1 - 1.0 K/uL   Eosinophils Absolute 0.1 0.0 - 0.7 K/uL   Basophils Absolute 0.0 0.0 - 0.1 K/uL  Comprehensive metabolic panel with GFR  Result Value Ref Range   Sodium 136 135 - 145 mEq/L   Potassium 4.7 3.5 - 5.1 mEq/L   Chloride 99 96 - 112 mEq/L   CO2 30 19 - 32 mEq/L   Glucose, Bld 107 (H) 70 - 99 mg/dL   BUN 20 6 - 23 mg/dL   Creatinine, Ser 4.40 0.40 - 1.50 mg/dL   Total  Bilirubin 0.5 0.2 - 1.2 mg/dL   Alkaline Phosphatase 77 39 - 117 U/L   AST 18 0 - 37 U/L   ALT 8 0 - 53 U/L   Total Protein 8.1 6.0 - 8.3 g/dL   Albumin 4.3 3.5 - 5.2 g/dL   GFR 34.74 >25.95 mL/min   Calcium 10.0 8.4 - 10.5 mg/dL  Magnesium  Result Value Ref Range   Magnesium 2.2 1.5 - 2.5 mg/dL  Urinalysis, Routine w reflex microscopic  Result Value Ref Range   Color, Urine YELLOW Yellow;Lt. Yellow;Straw;Dark Yellow;Amber;Green;Red;Brown   APPearance CLEAR Clear;Turbid;Slightly Cloudy;Cloudy   Specific Gravity, Urine 1.020 1.000 - 1.030   pH 6.0 5.0 - 8.0   Total Protein, Urine NEGATIVE Negative   Urine Glucose NEGATIVE Negative   Ketones, ur NEGATIVE Negative   Bilirubin Urine NEGATIVE Negative   Hgb urine dipstick NEGATIVE Negative   Urobilinogen, UA 1.0 0.0 - 1.0   Leukocytes,Ua TRACE (A) Negative   Nitrite NEGATIVE Negative   WBC, UA 0-2/hpf 0-2/hpf   RBC / HPF 0-2/hpf 0-2/hpf   Squamous Epithelial / HPF Rare(0-4/hpf) Rare(0-4/hpf)  CK  Result Value Ref Range   Total CK 105 7 - 232 U/L        Assessment & Plan:   CVD (cerebrovascular disease) -     Magnesium -     Rosuvastatin Calcium; Take 1 tablet (20 mg total) by mouth daily.  Dispense: 90 tablet; Refill: 1  Essential hypertension -     CBC with Differential/Platelet  Hemiparesis affecting left side as late effect of cerebrovascular accident (CVA) (HCC) -     CBC with Differential/Platelet -     Comprehensive metabolic panel with GFR -     Magnesium  Statin myopathy -     Magnesium -     CK; Future  BPH with obstruction/lower urinary tract symptoms -     oxyBUTYnin Chloride ER; Take 1 tablet (5 mg total) by mouth at bedtime.  Dispense: 30 tablet; Refill: 1 -     Urine Culture; Future -     Urinalysis, Routine w reflex microscopic; Future  Change atorvastatin to rosuvastatin to eval if this helps with muscle/joint pain PT/OT x 2 weekly to  continue Trial ditropan in the evenings to help with  BPH/LUTS Follow up with specialists as scheduled Keep appt with me in a month for medication follow up  Meds ordered this encounter  Medications   rosuvastatin (CRESTOR) 20 MG tablet    Sig: Take 1 tablet (20 mg total) by mouth daily.    Dispense:  90 tablet    Refill:  1   oxybutynin (DITROPAN-XL) 5 MG 24 hr tablet    Sig: Take 1 tablet (5 mg total) by mouth at bedtime.    Dispense:  30 tablet    Refill:  1    Return for as scheduled.  Sherald Barge, FNP

## 2023-05-22 ENCOUNTER — Encounter: Payer: Self-pay | Admitting: Internal Medicine

## 2023-05-22 LAB — URINE CULTURE: Result:: NO GROWTH

## 2023-05-22 NOTE — Progress Notes (Signed)
 The test results show that your current treatment is OK, as the tests are stable.  Please continue the same plan.  There is no other need for change of treatment or further evaluation based on these results, at this time.  thanks

## 2023-05-24 NOTE — Telephone Encounter (Signed)
 Left voicemail giving verbals for Stephen Meza -- voicemail does not have a recording so was unsure if it was a secure voicemail when previously attempting to call

## 2023-05-24 NOTE — Telephone Encounter (Signed)
 Copied from CRM [id number]. Topic: Clinical - Home Health Verbal Orders >> May 21, 2023  9:53 AM Elizebeth Brooking wrote: Caller/Agency: [facility] Number: [phone] Service Requested: Physical Therapy Frequency: 2 a week for 3 weeks and 1 a week for 5 weeks  Any new concerns about the patient? No >> May 24, 2023 11:39 AM [place] J wrote: Eber Jones returning missed call for verbal orders. She did state that the voicemail box is secured and if a missed call occurs again, please leave verbal orders in voicemail.

## 2023-05-24 NOTE — Telephone Encounter (Signed)
 Unable to reach, will try again at a later time

## 2023-05-24 NOTE — Telephone Encounter (Signed)
 OK thank you

## 2023-05-25 ENCOUNTER — Ambulatory Visit: Admitting: Acute Care

## 2023-05-25 ENCOUNTER — Ambulatory Visit: Payer: Self-pay

## 2023-05-25 NOTE — Telephone Encounter (Signed)
 Routing to DOD

## 2023-05-25 NOTE — Telephone Encounter (Signed)
 Ok to hold statin and follow up pcp 1-2 weeks. They can address bladder issue then as well.

## 2023-05-25 NOTE — Telephone Encounter (Signed)
 Wife Stephen Meza calls about the pt. Pt was seen in the office on Friday 3/28. Pt was prescribed rosuvastatin and oxybutynin.   Wife reports pt has new-onset muscle aches since starting rosuvastatin. Wife states pt reports pain in his legs, shoulders, and arms. Pt uses a walker (recent stroke). Wife states that while the pt can still walk, he is having worse pain in his legs. Wife inquiring about whether a statin medication is necessary and, if it is, could he take something different. Wife also states oxybutynin is not working well. Wife states one night recently the pt was needing to et up 7-10x/night even after taking that medication.  RN advised RN would relay the side effects of the medication rosuvastatin to the appropriate people for follow-up, as well as information about the oxybutynin not being effective. RN advised wife that if the patient has worsening weakness or pain, becomes unable to walk, experiences CP or SOB she should call 911. Wife verbalized understanding.    Copied from CRM [id number]. Topic: Clinical - Red Word Triage >> May 25, 2023  1:18 PM Denese Killings wrote: [facility] that prompted transfer to Nurse Triage: Patient was placed on a lower dose of Rosuvastatin and it has affected his muscles tremendously. Patient wife wants to know if husband can stop taking it if his cholesterol is normal. Symptoms- legs hurting ,walking is off, shoulder and arm hurting terribly. Answer Assessment - Initial Assessment Questions 1. ONSET: "When did the muscle aches or body pains start?"      Since taking rosuvastatin prescribed 3/28. Stroke recently, discharged from hospital. Seen in office 3/28 - prescribed rosuvastatin and within 2 days muscles began to hurt  2. LOCATION: "What part of your body is hurting?" (e.g., entire body, arms, legs)      Legs, shoulders, arms  3. SEVERITY: "How bad is the pain?" (Scale 1-10; or mild, moderate, severe)   - MILD (1-3): doesn't interfere with normal activities     - MODERATE (4-7): interferes with normal activities or awakens from sleep    - SEVERE (8-10):  excruciating pain, unable to do any normal activities      7/10  4. CAUSE: "What do you think is causing the pains?"     Rosuvastatin 5. FEVER: "Have you been having fever?"     No 6. OTHER SYMPTOMS: "Do you have any other symptoms?" (e.g., chest pain, weakness, rash, cold or flu symptoms, weight loss)     Worse leg pain and difficulty walking. Pt walks with a walker. Wife states his legs become weak so he sits back down quickly. Wife states pt was having weakness in legs Friday 3/28 at the office visit. Since having a stroke pt has had to use  a walker. Now since taking rosuvastatin legs are also painful. Pt endorses pain in the legs, shoulders, and arms.  Oxybutynin also prescribed on 3/28 office visit - wife states pt is getting up 7-10x/night even with that medication.  Protocols used: Muscle Aches and Body Pain-A-AH

## 2023-05-25 NOTE — Telephone Encounter (Signed)
 Spoke with patients daughter, she will hold patients statin for now. Her question is, does patient truly need a cholesterol medication? Him constantly getting up to use the bathroom has not changed at all since being on the Oxybutinin, daughter wants to know can they double medication or is there something differently that can be done? They are happy to bring patient back in 2 weeks, however would like to know what to do in the meantime. Patient has a lab visit scheduled, should he keep this as well?

## 2023-05-26 DIAGNOSIS — M109 Gout, unspecified: Secondary | ICD-10-CM | POA: Diagnosis not present

## 2023-05-26 DIAGNOSIS — R911 Solitary pulmonary nodule: Secondary | ICD-10-CM | POA: Diagnosis not present

## 2023-05-26 DIAGNOSIS — M1990 Unspecified osteoarthritis, unspecified site: Secondary | ICD-10-CM | POA: Diagnosis not present

## 2023-05-26 DIAGNOSIS — M4802 Spinal stenosis, cervical region: Secondary | ICD-10-CM | POA: Diagnosis not present

## 2023-05-26 DIAGNOSIS — Z8551 Personal history of malignant neoplasm of bladder: Secondary | ICD-10-CM | POA: Diagnosis not present

## 2023-05-26 DIAGNOSIS — I69354 Hemiplegia and hemiparesis following cerebral infarction affecting left non-dominant side: Secondary | ICD-10-CM | POA: Diagnosis not present

## 2023-05-26 DIAGNOSIS — Z7902 Long term (current) use of antithrombotics/antiplatelets: Secondary | ICD-10-CM | POA: Diagnosis not present

## 2023-05-26 DIAGNOSIS — I499 Cardiac arrhythmia, unspecified: Secondary | ICD-10-CM | POA: Diagnosis not present

## 2023-05-26 DIAGNOSIS — D519 Vitamin B12 deficiency anemia, unspecified: Secondary | ICD-10-CM | POA: Diagnosis not present

## 2023-05-26 DIAGNOSIS — I10 Essential (primary) hypertension: Secondary | ICD-10-CM | POA: Diagnosis not present

## 2023-05-26 DIAGNOSIS — Z48812 Encounter for surgical aftercare following surgery on the circulatory system: Secondary | ICD-10-CM | POA: Diagnosis not present

## 2023-05-26 DIAGNOSIS — G720 Drug-induced myopathy: Secondary | ICD-10-CM | POA: Diagnosis not present

## 2023-05-26 NOTE — Telephone Encounter (Signed)
 Spoke with patients wife, gave a verbal understanding to holding Statin and doubling Oxybutynin dose and patient is scheduled for 2 week check in

## 2023-05-26 NOTE — Telephone Encounter (Signed)
 Called patient, daughter answered, states patient and wife are still sleeping. She offered to wake them, advised her that I will call them back a little later in the day as I know they have not been sleeping much lately. She was very Adult nurse.

## 2023-05-27 DIAGNOSIS — R911 Solitary pulmonary nodule: Secondary | ICD-10-CM | POA: Diagnosis not present

## 2023-05-27 DIAGNOSIS — G720 Drug-induced myopathy: Secondary | ICD-10-CM | POA: Diagnosis not present

## 2023-05-27 DIAGNOSIS — Z48812 Encounter for surgical aftercare following surgery on the circulatory system: Secondary | ICD-10-CM | POA: Diagnosis not present

## 2023-05-27 DIAGNOSIS — I69354 Hemiplegia and hemiparesis following cerebral infarction affecting left non-dominant side: Secondary | ICD-10-CM | POA: Diagnosis not present

## 2023-05-27 DIAGNOSIS — I499 Cardiac arrhythmia, unspecified: Secondary | ICD-10-CM | POA: Diagnosis not present

## 2023-05-27 DIAGNOSIS — Z8551 Personal history of malignant neoplasm of bladder: Secondary | ICD-10-CM | POA: Diagnosis not present

## 2023-05-27 DIAGNOSIS — I10 Essential (primary) hypertension: Secondary | ICD-10-CM | POA: Diagnosis not present

## 2023-05-27 DIAGNOSIS — Z7902 Long term (current) use of antithrombotics/antiplatelets: Secondary | ICD-10-CM | POA: Diagnosis not present

## 2023-05-27 DIAGNOSIS — M1990 Unspecified osteoarthritis, unspecified site: Secondary | ICD-10-CM | POA: Diagnosis not present

## 2023-05-27 DIAGNOSIS — D519 Vitamin B12 deficiency anemia, unspecified: Secondary | ICD-10-CM | POA: Diagnosis not present

## 2023-05-27 DIAGNOSIS — M109 Gout, unspecified: Secondary | ICD-10-CM | POA: Diagnosis not present

## 2023-05-27 DIAGNOSIS — M4802 Spinal stenosis, cervical region: Secondary | ICD-10-CM | POA: Diagnosis not present

## 2023-05-28 DIAGNOSIS — M4802 Spinal stenosis, cervical region: Secondary | ICD-10-CM | POA: Diagnosis not present

## 2023-05-28 DIAGNOSIS — R911 Solitary pulmonary nodule: Secondary | ICD-10-CM | POA: Diagnosis not present

## 2023-05-28 DIAGNOSIS — I499 Cardiac arrhythmia, unspecified: Secondary | ICD-10-CM | POA: Diagnosis not present

## 2023-05-28 DIAGNOSIS — D519 Vitamin B12 deficiency anemia, unspecified: Secondary | ICD-10-CM | POA: Diagnosis not present

## 2023-05-28 DIAGNOSIS — M109 Gout, unspecified: Secondary | ICD-10-CM | POA: Diagnosis not present

## 2023-05-28 DIAGNOSIS — Z7902 Long term (current) use of antithrombotics/antiplatelets: Secondary | ICD-10-CM | POA: Diagnosis not present

## 2023-05-28 DIAGNOSIS — M1990 Unspecified osteoarthritis, unspecified site: Secondary | ICD-10-CM | POA: Diagnosis not present

## 2023-05-28 DIAGNOSIS — G720 Drug-induced myopathy: Secondary | ICD-10-CM | POA: Diagnosis not present

## 2023-05-28 DIAGNOSIS — I10 Essential (primary) hypertension: Secondary | ICD-10-CM | POA: Diagnosis not present

## 2023-05-28 DIAGNOSIS — Z48812 Encounter for surgical aftercare following surgery on the circulatory system: Secondary | ICD-10-CM | POA: Diagnosis not present

## 2023-05-28 DIAGNOSIS — I69354 Hemiplegia and hemiparesis following cerebral infarction affecting left non-dominant side: Secondary | ICD-10-CM | POA: Diagnosis not present

## 2023-05-28 DIAGNOSIS — Z8551 Personal history of malignant neoplasm of bladder: Secondary | ICD-10-CM | POA: Diagnosis not present

## 2023-05-31 DIAGNOSIS — I69354 Hemiplegia and hemiparesis following cerebral infarction affecting left non-dominant side: Secondary | ICD-10-CM | POA: Diagnosis not present

## 2023-05-31 DIAGNOSIS — G720 Drug-induced myopathy: Secondary | ICD-10-CM | POA: Diagnosis not present

## 2023-05-31 DIAGNOSIS — M109 Gout, unspecified: Secondary | ICD-10-CM | POA: Diagnosis not present

## 2023-05-31 DIAGNOSIS — I10 Essential (primary) hypertension: Secondary | ICD-10-CM | POA: Diagnosis not present

## 2023-05-31 DIAGNOSIS — M1990 Unspecified osteoarthritis, unspecified site: Secondary | ICD-10-CM | POA: Diagnosis not present

## 2023-05-31 DIAGNOSIS — M4802 Spinal stenosis, cervical region: Secondary | ICD-10-CM | POA: Diagnosis not present

## 2023-05-31 DIAGNOSIS — I499 Cardiac arrhythmia, unspecified: Secondary | ICD-10-CM | POA: Diagnosis not present

## 2023-05-31 DIAGNOSIS — Z8551 Personal history of malignant neoplasm of bladder: Secondary | ICD-10-CM | POA: Diagnosis not present

## 2023-05-31 DIAGNOSIS — Z48812 Encounter for surgical aftercare following surgery on the circulatory system: Secondary | ICD-10-CM | POA: Diagnosis not present

## 2023-05-31 DIAGNOSIS — D519 Vitamin B12 deficiency anemia, unspecified: Secondary | ICD-10-CM | POA: Diagnosis not present

## 2023-05-31 DIAGNOSIS — Z7902 Long term (current) use of antithrombotics/antiplatelets: Secondary | ICD-10-CM | POA: Diagnosis not present

## 2023-05-31 DIAGNOSIS — R911 Solitary pulmonary nodule: Secondary | ICD-10-CM | POA: Diagnosis not present

## 2023-06-03 DIAGNOSIS — M4802 Spinal stenosis, cervical region: Secondary | ICD-10-CM | POA: Diagnosis not present

## 2023-06-03 DIAGNOSIS — D519 Vitamin B12 deficiency anemia, unspecified: Secondary | ICD-10-CM | POA: Diagnosis not present

## 2023-06-03 DIAGNOSIS — Z8551 Personal history of malignant neoplasm of bladder: Secondary | ICD-10-CM | POA: Diagnosis not present

## 2023-06-03 DIAGNOSIS — I10 Essential (primary) hypertension: Secondary | ICD-10-CM | POA: Diagnosis not present

## 2023-06-03 DIAGNOSIS — Z48812 Encounter for surgical aftercare following surgery on the circulatory system: Secondary | ICD-10-CM | POA: Diagnosis not present

## 2023-06-03 DIAGNOSIS — M109 Gout, unspecified: Secondary | ICD-10-CM | POA: Diagnosis not present

## 2023-06-03 DIAGNOSIS — Z7902 Long term (current) use of antithrombotics/antiplatelets: Secondary | ICD-10-CM | POA: Diagnosis not present

## 2023-06-03 DIAGNOSIS — I69354 Hemiplegia and hemiparesis following cerebral infarction affecting left non-dominant side: Secondary | ICD-10-CM | POA: Diagnosis not present

## 2023-06-03 DIAGNOSIS — I499 Cardiac arrhythmia, unspecified: Secondary | ICD-10-CM | POA: Diagnosis not present

## 2023-06-03 DIAGNOSIS — M1990 Unspecified osteoarthritis, unspecified site: Secondary | ICD-10-CM | POA: Diagnosis not present

## 2023-06-03 DIAGNOSIS — G720 Drug-induced myopathy: Secondary | ICD-10-CM | POA: Diagnosis not present

## 2023-06-03 DIAGNOSIS — R911 Solitary pulmonary nodule: Secondary | ICD-10-CM | POA: Diagnosis not present

## 2023-06-03 NOTE — Telephone Encounter (Signed)
 Spoke with spouse. Appt scheduled tomorrow with Maralyn Sago at 10:30am. Family knows to arrive at 10am for chest xray.

## 2023-06-03 NOTE — Telephone Encounter (Signed)
 Note follow up planned for 4/11. Unclear how long we have to wait to stop his anti-platelet meds, probably a few months.

## 2023-06-03 NOTE — Progress Notes (Addendum)
 History of Present Illness Stephen Meza is a 89 y.o. male male former smoker  with history of bladder cancer referred 03/2023 for a lung mass. He will be followed by Dr. Baldwin Levee.   Synopsis Patient previously seen by [facility] 04/26/2023 for evaluation of a lung nodule 04/12/2023, incidentally discovered when a CT Angio done 02/2023 to evaluate for pneumonia. He was treated with antibiotics for his pneumonia , pt stated the medication  resolved his symptoms.He denied  any further fever or purulent secretions.  He had follow up CT chest  03/26/2023, which  revealed a 6.3 by 5.2 cm right parahilar mass, so slightly smaller on follow up,  but the nodule persisted.  There is also suspicion of lymph node involvement.  PET scan was done 04/22/2023 to better evaluate the lung nodule.  It showed an intensely hypermetabolic right hilar mass that was consistent with a primary bronchogenic carcinoma.  The mass spanned from the right upper lobe to the right lower lobe constricting the right lower lobe bronchus.  There was also notation of hypermetabolic right lower paratracheal and subcarinal lymph nodes that is consistent with nodal metastasis.  There was no other evidence of distant metastatic disease.  Patient has had weight loss at the March 3 office visit.  He had reported losing approximately 12 pounds over the past year.  At that time he denied any hemoptysis.  He did confirm dyspnea with exertion.  Patient quit smoking approximately 30 years ago.  Patient was scheduled for bronchoscopy with biopsies to further evaluate the nodule on 05/10/2023.  However before the patient to get procedure done he presented to the emergency room on 05/09/2023 with symptoms of stroke.  MRI brain done 05/09/2023 showed multifocal acute infarcts within the right MCA and PCA territories including the right precentral gyrus.  Additionally there was attenuated right ICA flow void at the skull base likely related to stenosis.   There was abnormal right vertebral artery V2 segment flow void consistent with slow flow or occlusion.  In addition to a distal right vertebral artery flow void.  There is also notation of mild spinal canal stenosis and severe right neural foraminal stenosis at C4-C5.  There was moderate right and severe left C5-6 and C6-7 neural for aminal stenosis noted also.  Patient was in the hospital for a total of 6 days.  He had a right transcarotid artery revascularization-10x40 mm stent  for Symptomatic right internal carotid artery stenosis that is most likely the etiology of his stroke on May 14, 2023.  He was discharged home May 15, 2023.  Patient is currently on dual antiplatelet therapy.  Patient and family understand that we cannot move forward with bronchoscopy with biopsies until it is safe for him to come off his dual antiplatelet therapy. Patient states he has been weak since the procedure.  He is receiving in-home physical therapy that is helping him regain his strength.  Patient developed worsening shortness of breath with purulent secretions and cough since released from the hospital..  Patient has been brought into the office for evaluation of this significant finding after his stroke and recent revascularization procedure.  Of note patient has follow-up with vascular surgery on Jul 08, 2023.  At that time he will have an ultrasound of this digit vessel to make sure patency.  Patient will be on dual platelet therapy until this visit.  We will reach out to the vascular surgeon to see if he feels the patient will be able  to come off dual platelet therapy anytime soon for bronchoscopy with biopsies of the  06/04/2023 Presents today for worsening respiratory status and cough.  Family have noted that he has become more short of breath since his hospitalization, produces more sputum and did have 1 episode of an oxygen desaturation to less than 80%.  They state this self resolved and his oxygen  saturations rebounded to 95%.  Wife and daughter thought this was brought on by a significant coughing spell.  He has not had any further episodes of oxygen desaturation. Secretions are thick and slightly purulent.  There is also some blood-tinged mucus noted.  Chest x-ray was done to better evaluate in the office today.  This showed bibasilar atelectasis and a questionable developing infiltrate.  Patient denies any fever.  Oxygen saturations in the office today are 95% on room air.  He does have significant exertional dyspnea, and does sound very rhonchorous upon physical exam.  He also has a significant cough that is productive. He is getting home Physical therapy for his fatigue and posthospital weakness.  He was placed on statin therapy after his stroke in the hospital ,however developed significant joint aches and weakness.  Patient's primary care physician decreased the dose initially without improvement and the patient is currently on a statin holiday until he follows up with primary care next week.  Plan will be to restart at that time and reassess.  Of note patient's weight was 170 pounds 9.6 ounces on 04/12/2023,  Weight was 168 pounds 6.4 ounces on April 26, 2023 Weight 06/04/2023 is 155 pounds 6.4 ounces.  While some of his weight loss may be attributed to his hospitalization and stroke it is concerning in the setting of what we anticipate being a lung cancer.    Test Results: CXR 06/04/2023 Normal cardiac size. Large right perihilar mass is noted consistent with malignancy. Mild left basilar atelectasis or infiltrate is noted. Increased right basilar opacity is noted concerning for worsening atelectasis or possibly pneumonia. Bony thorax is unremarkable.   IMPRESSION: Large right perihilar mass is again noted consistent with malignancy. Bibasilar atelectasis or infiltrates are noted.  PET scan 04/22/2023 Intensely hypermetabolic RIGHT hilar mass consistent with primary bronchogenic  carcinoma. Mass spans the RIGHT upper lobe and RIGHT lower lobe and constricts the RIGHT lower lobe bronchus 2. Hypermetabolic RIGHT lower paratracheal and subcarinal nodes consistent with nodal metastasis. 3. No evidence of distant metastatic disease. 4. Benign LEFT adrenal adenoma. 5.  Aortic Atherosclerosis (ICD10-I70.0).    03/26/2023 CT Chest with Contrast Thyroid  gland and esophagus are unremarkable. 12 mm precarinal lymph node is noted consistent with malignancy. 12 mm subcarinal lymph node is noted consistent with malignancy.   Lungs/Pleura: No pneumothorax or pleural effusion is noted. Grossly stable 6.3 x 5.2 cm right perihilar mass is noted which extends into the right upper and lower lobes, most consistent with malignancy. Some degree of postobstructive atelectasis or pneumonia is noted in right lower lobe. Scarring is noted throughout both lungs peripherally. 6.3 x 5.2 cm right perihilar mass is noted which extends into right upper and lower lobes, most consistent with malignancy. Biopsy via bronchoscopy is recommended. Mildly enlarged carinal and subcarinal lymph nodes are noted concerning for metastatic disease. Some degree of postobstructive atelectasis or pneumonia may be present in the right lower lobe.   CTA 03/16/2023 Masslike consolidation in the central right lower lobe extending from the right hilum into the superior segment of the right lower lobe, measuring 7.9 x 5.5 cm.  While this could reflect pneumonia, appearance is concerning for tumor/malignancy. Areas of peripheral fibrosis in the lungs bilaterally.  Masslike opacity in the right hilum and central right lower lobe could reflect pneumonia, but appearance is concerning for malignancy. Followup CT is recommended in 3-4 weeks following trial of antibiotic therapy to assess for resolution.  Areas of peripheral fibrosis throughout the lungs bilaterally.      Latest Ref Rng & Units 05/21/2023    2:48 PM 05/15/2023     5:10 AM 05/14/2023    7:58 AM  CBC  WBC 4.0 - 10.5 K/uL 8.7  7.9  6.4   Hemoglobin 13.0 - 17.0 g/dL 16.1  9.6  09.6   Hematocrit 39.0 - 52.0 % 36.2  30.3  34.4   Platelets 150.0 - 400.0 K/uL 338.0  217  240        Latest Ref Rng & Units 05/21/2023    2:48 PM 05/15/2023    5:10 AM 05/14/2023    7:58 AM  BMP  Glucose 70 - 99 mg/dL 045  409  811   BUN 6 - 23 mg/dL 20  16  14    Creatinine 0.40 - 1.50 mg/dL 9.14  7.82  9.56   Sodium 135 - 145 mEq/L 136  135  136   Potassium 3.5 - 5.1 mEq/L 4.7  3.7  3.9   Chloride 96 - 112 mEq/L 99  102  102   CO2 19 - 32 mEq/L 30  24  25    Calcium  8.4 - 10.5 mg/dL 21.3  8.8  9.1     BNP No results found for: BNP  ProBNP No results found for: PROBNP  PFT No results found for: FEV1PRE, FEV1POST, FVCPRE, FVCPOST, TLC, DLCOUNC, PREFEV1FVCRT, PSTFEV1FVCRT  DG Chest 2 View Result Date: 06/04/2023 CLINICAL DATA:  Lung mass. EXAM: CHEST - 2 VIEW COMPARISON:  May 14, 2023. FINDINGS: Normal cardiac size. Large right perihilar mass is noted consistent with malignancy. Mild left basilar atelectasis or infiltrate is noted. Increased right basilar opacity is noted concerning for worsening atelectasis or possibly pneumonia. Bony thorax is unremarkable. IMPRESSION: Large right perihilar mass is again noted consistent with malignancy. Bibasilar atelectasis or infiltrates are noted. Electronically Signed   By: Rosalene Colon M.D.   On: 06/04/2023 10:12   DG Chest Port 1 View Result Date: 05/14/2023 CLINICAL DATA:  Probable lung cancer.  Status post bronchoscopy. EXAM: PORTABLE CHEST 1 VIEW COMPARISON:  X-ray 03/10/2023.  PET-CT 04/22/2023 and older. FINDINGS: Stable cardiopericardial silhouette with tortuous ectatic aorta. Right infrahilar mass again identified. Interstitial lung changes identified with some bandlike changes at the bases. No pneumothorax or effusion. Overlapping cardiac leads. Degenerative changes of the spine. Osteopenia.  IMPRESSION: No pneumothorax or effusion seen post bronchoscopy. Please correlate with prior workup. Electronically Signed   By: Adrianna Horde M.D.   On: 05/14/2023 15:16   HYBRID OR IMAGING ([facility] ONLY) Result Date: 05/14/2023 There is no interpretation for this exam.  This order is for images obtained during a surgical procedure.  Please See Surgeries Tab for more information regarding the procedure.   ECHOCARDIOGRAM COMPLETE Result Date: 05/11/2023    ECHOCARDIOGRAM REPORT   Patient Name:   Stephen Meza Date of Exam: 05/11/2023 Medical Rec #:  [id number]            Height:       72.0 in Accession #:    [id number]           Weight:  165.1 lb Date of Birth:  01/16/1934            BSA:          1.964 m Patient Age:    89 years             BP:           170/72 mmHg Patient Gender: M                    HR:           71 bpm. Exam Location:  Inpatient Procedure: 2D Echo, Cardiac Doppler and Color Doppler (Both Spectral and Color            Flow Doppler were utilized during procedure). Indications:    Stroke  History:        Patient has no prior history of Echocardiogram examinations.                 Stroke; Risk Factors:Hypertension and Former Smoker.  Sonographer:    Willey Harrier Referring Phys: [phone] Angelene Kelly  Sonographer Comments: Technically difficult study due to poor echo windows. Image acquisition challenging due to respiratory motion. IMPRESSIONS  1. Left ventricular ejection fraction, by estimation, is 55 to 60%. The left ventricle has normal function. The left ventricle has no regional wall motion abnormalities. There is mild concentric left ventricular hypertrophy. Left ventricular diastolic parameters are consistent with Grade I diastolic dysfunction (impaired relaxation).  2. Right ventricular systolic function is normal. The right ventricular size is mildly enlarged.  3. The mitral valve is normal in structure. Mild mitral valve regurgitation. No evidence of mitral stenosis.  4.  The aortic valve is tricuspid. Aortic valve regurgitation is not visualized. Aortic valve sclerosis/calcification is present, without any evidence of aortic stenosis.  5. Aortic dilatation noted. There is mild dilatation of the aortic root, measuring 42 mm. There is mild dilatation of the ascending aorta, measuring 41 mm.  6. The inferior vena cava is normal in size with greater than 50% respiratory variability, suggesting right atrial pressure of 3 mmHg. Comparison(s): No prior Echocardiogram. FINDINGS  Left Ventricle: Left ventricular ejection fraction, by estimation, is 55 to 60%. The left ventricle has normal function. The left ventricle has no regional wall motion abnormalities. The left ventricular internal cavity size was normal in size. There is  mild concentric left ventricular hypertrophy. Left ventricular diastolic parameters are consistent with Grade I diastolic dysfunction (impaired relaxation). Right Ventricle: The right ventricular size is mildly enlarged. Right ventricular systolic function is normal. Left Atrium: Left atrial size was normal in size. Right Atrium: Right atrial size was normal in size. Pericardium: There is no evidence of pericardial effusion. Mitral Valve: The mitral valve is normal in structure. Mild mitral annular calcification. Mild mitral valve regurgitation. No evidence of mitral valve stenosis. MV peak gradient, 6.0 mmHg. The mean mitral valve gradient is 2.0 mmHg. Tricuspid Valve: The tricuspid valve is normal in structure. Tricuspid valve regurgitation is mild . No evidence of tricuspid stenosis. Aortic Valve: The aortic valve is tricuspid. Aortic valve regurgitation is not visualized. Aortic valve sclerosis/calcification is present, without any evidence of aortic stenosis. Aortic valve mean gradient measures 6.0 mmHg. Aortic valve peak gradient measures 8.5 mmHg. Aortic valve area, by VTI measures 3.06 cm. Pulmonic Valve: The pulmonic valve was normal in structure. Pulmonic  valve regurgitation is trivial. No evidence of pulmonic stenosis. Aorta: Aortic dilatation noted. There is mild dilatation of the aortic root,  measuring 42 mm. There is mild dilatation of the ascending aorta, measuring 41 mm. Venous: The inferior vena cava is normal in size with greater than 50% respiratory variability, suggesting right atrial pressure of 3 mmHg. IAS/Shunts: No atrial level shunt detected by color flow Doppler.  LEFT VENTRICLE PLAX 2D LVIDd:         2.80 cm   Diastology LVIDs:         2.30 cm   LV e' medial:    6.96 cm/s LV PW:         1.55 cm   LV E/e' medial:  9.9 LV IVS:        1.55 cm   LV e' lateral:   7.40 cm/s LVOT diam:     2.30 cm   LV E/e' lateral: 9.3 LV SV:         83 LV SV Index:   42 LVOT Area:     4.15 cm  RIGHT VENTRICLE             IVC RV Basal diam:  4.60 cm     IVC diam: 2.20 cm RV S prime:     12.30 cm/s TAPSE (M-mode): 1.8 cm LEFT ATRIUM             Index        RIGHT ATRIUM           Index LA Vol (A2C):   65.1 ml 33.15 ml/m  RA Area:     21.30 cm LA Vol (A4C):   49.3 ml 25.10 ml/m  RA Volume:   57.20 ml  29.13 ml/m LA Biplane Vol: 60.7 ml 30.91 ml/m  AORTIC VALVE AV Area (Vmax):    3.13 cm AV Area (Vmean):   2.91 cm AV Area (VTI):     3.06 cm AV Vmax:           146.00 cm/s AV Vmean:          112.000 cm/s AV VTI:            0.270 m AV Peak Grad:      8.5 mmHg AV Mean Grad:      6.0 mmHg LVOT Vmax:         110.00 cm/s LVOT Vmean:        78.500 cm/s LVOT VTI:          0.199 m LVOT/AV VTI ratio: 0.74  AORTA Ao Root diam: 4.20 cm Ao Asc diam:  4.10 cm MITRAL VALVE                TRICUSPID VALVE MV Area (PHT): 2.13 cm     TR Peak grad:   28.7 mmHg MV Area VTI:   2.92 cm     TR Vmax:        268.00 cm/s MV Peak grad:  6.0 mmHg MV Mean grad:  2.0 mmHg     SHUNTS MV Vmax:       1.22 m/s     Systemic VTI:  0.20 m MV Vmean:      68.4 cm/s    Systemic Diam: 2.30 cm MV Decel Time: 356 msec MV E velocity: 68.65 cm/s MV A velocity: 104.50 cm/s MV E/A ratio:  0.66 Alexandria Angel MD  Electronically signed by Alexandria Angel MD Signature Date/Time: 05/11/2023/1:16:49 PM    Final    CT ANGIO HEAD NECK W WO CM Result Date: 05/10/2023 CLINICAL DATA:  Stroke workup EXAM: CT ANGIOGRAPHY HEAD AND NECK WITH  AND WITHOUT CONTRAST TECHNIQUE: Multidetector CT imaging of the head and neck was performed using the standard protocol during bolus administration of intravenous contrast. Multiplanar CT image reconstructions and MIPs were obtained to evaluate the vascular anatomy. Carotid stenosis measurements (when applicable) are obtained utilizing NASCET criteria, using the distal internal carotid diameter as the denominator. RADIATION DOSE REDUCTION: This exam was performed according to the departmental dose-optimization program which includes automated exposure control, adjustment of the mA and/or kV according to patient size and/or use of iterative reconstruction technique. CONTRAST:  75mL OMNIPAQUE  IOHEXOL  350 MG/ML SOLN COMPARISON:  Brain MRI from earlier today FINDINGS: CT HEAD FINDINGS Brain: Acute infarcts by prior brain MRI are underestimated. No evidence of progression. No hemorrhage, hydrocephalus, or shift. Mild for age cerebral volume loss Vascular: See below Skull: No acute finding Sinuses/Orbits: No acute finding Review of the MIP images confirms the above findings CTA NECK FINDINGS Aortic arch: Atheromatous plaque which is extensive. Three vessel branching. No dissection or stenosis. Right carotid system: Diffuse atheromatous wall thickening with mixed density plaque at the bifurcation causing a short segment string sign over a 1 cm length, near the angle of the mandible. No ulceration or beading. Left carotid system: Atheromatous wall thickening of the common carotid with mixed density plaque at the bifurcation. No flow reducing stenosis, ulceration, or beading. Vertebral arteries: No proximal subclavian stenosis. The left vertebral artery is strongly dominant and widely patent. Diminutive and  relatively faintly enhancing right vertebral artery proximally but patent to the dura without visible beading or dissection. Skeleton: Generalized degenerative spurring without acute finding. Other neck: No acute finding Upper chest: Biapical subpleural fibrotic appearance. Review of the MIP images confirms the above findings CTA HEAD FINDINGS Anterior circulation: No major branch occlusion, beading, or proximal flow reducing stenosis. There is bilateral atheromatous calcification of the cavernous carotids. On thick MIPS, there is less density in the right MCA distribution diffusely. Negative for aneurysm. Posterior circulation: Left dominant vertebral artery. The diminutive right vertebral artery is patent with filling PICA. Atheromatous irregularity of the posterior cerebral arteries with moderate left P1 and advanced right P1/P2 stenoses. Venous sinuses: Unremarkable Review of the MIP images confirms the above findings IMPRESSION: 1. Severe atheromatous stenosis at the proximal right ICA with short segment string sign and intracranial relative underfilling. 2. Advanced stenosis in the right more than left proximal PCA. Electronically Signed   By: Ronnette Coke M.D.   On: 05/10/2023 05:10   MR Brain W and Wo Contrast Result Date: 05/10/2023 CLINICAL DATA:  Acute neurologic deficit EXAM: MRI HEAD WITHOUT AND WITH CONTRAST MRI CERVICAL SPINE WITHOUT AND WITH CONTRAST TECHNIQUE: Multiplanar, multiecho pulse sequences of the brain and surrounding structures, and cervical spine, to include the craniocervical junction and cervicothoracic junction, were obtained without and with intravenous contrast. CONTRAST:  7mL GADAVIST  GADOBUTROL  1 MMOL/ML IV SOLN COMPARISON:  None Available. FINDINGS: MRI HEAD FINDINGS Brain: Multifocal abnormal diffusion restriction within the right MCA and PCA territories, including the right precentral gyrus. Fewer than 5 scattered chronic microhemorrhages in a nonspecific pattern. There is  multifocal hyperintense T2-weighted signal within the white matter. Parenchymal volume and CSF spaces are normal. Old left cerebellar infarct. The midline structures are normal. There is no abnormal contrast enhancement. Vascular: Attenuated right ICA flow void. Skull and upper cervical spine: Normal calvarium and skull base. Visualized upper cervical spine and soft tissues are normal. Sinuses/Orbits:No paranasal sinus fluid levels or advanced mucosal thickening. No mastoid or middle ear effusion. Normal orbits. MRI  CERVICAL SPINE FINDINGS Alignment: Physiologic. Vertebrae: No fracture, evidence of discitis, or bone lesion. Cord: Normal signal and morphology. Posterior Fossa, vertebral arteries, paraspinal tissues: Abnormal proximal right vertebral artery V2 segment flow void. Disc levels: C1-2: Unremarkable. C2-3: Normal disc. Mild facet hypertrophy. There is no spinal canal stenosis. No neural foraminal stenosis. C3-4: Right uncovertebral hypertrophy. There is no spinal canal stenosis. Moderate right neural foraminal stenosis. C4-5: Right foraminal disc osteophyte complex. Mild spinal canal stenosis. Severe right neural foraminal stenosis. C5-6: Bilateral uncovertebral hypertrophy. There is no spinal canal stenosis. Moderate right and severe left neural foraminal stenosis. C6-7: Left uncovertebral spurring. There is no spinal canal stenosis. Severe left neural foraminal stenosis. C7-T1: Disc space narrowing without herniation. There is no spinal canal stenosis. No neural foraminal stenosis. IMPRESSION: 1. Multifocal acute infarcts within the right MCA and PCA territories, including the right precentral gyrus. 2. Attenuated right ICA flow void at the skull base, likely stenosis. 3. Abnormal right vertebral artery V2 segment flow void, consistent with slow flow or occlusion. The distal right vertebral artery flow void is maintained. 4. Mild spinal canal stenosis and severe right neural foraminal stenosis at C4-5. 5.  Moderate right and severe left C5-6 and C6-7 neural foraminal stenosis. Electronically Signed   By: Juanetta Nordmann M.D.   On: 05/10/2023 00:47   MR Cervical Spine W and Wo Contrast Result Date: 05/10/2023 CLINICAL DATA:  Acute neurologic deficit EXAM: MRI HEAD WITHOUT AND WITH CONTRAST MRI CERVICAL SPINE WITHOUT AND WITH CONTRAST TECHNIQUE: Multiplanar, multiecho pulse sequences of the brain and surrounding structures, and cervical spine, to include the craniocervical junction and cervicothoracic junction, were obtained without and with intravenous contrast. CONTRAST:  7mL GADAVIST  GADOBUTROL  1 MMOL/ML IV SOLN COMPARISON:  None Available. FINDINGS: MRI HEAD FINDINGS Brain: Multifocal abnormal diffusion restriction within the right MCA and PCA territories, including the right precentral gyrus. Fewer than 5 scattered chronic microhemorrhages in a nonspecific pattern. There is multifocal hyperintense T2-weighted signal within the white matter. Parenchymal volume and CSF spaces are normal. Old left cerebellar infarct. The midline structures are normal. There is no abnormal contrast enhancement. Vascular: Attenuated right ICA flow void. Skull and upper cervical spine: Normal calvarium and skull base. Visualized upper cervical spine and soft tissues are normal. Sinuses/Orbits:No paranasal sinus fluid levels or advanced mucosal thickening. No mastoid or middle ear effusion. Normal orbits. MRI CERVICAL SPINE FINDINGS Alignment: Physiologic. Vertebrae: No fracture, evidence of discitis, or bone lesion. Cord: Normal signal and morphology. Posterior Fossa, vertebral arteries, paraspinal tissues: Abnormal proximal right vertebral artery V2 segment flow void. Disc levels: C1-2: Unremarkable. C2-3: Normal disc. Mild facet hypertrophy. There is no spinal canal stenosis. No neural foraminal stenosis. C3-4: Right uncovertebral hypertrophy. There is no spinal canal stenosis. Moderate right neural foraminal stenosis. C4-5: Right  foraminal disc osteophyte complex. Mild spinal canal stenosis. Severe right neural foraminal stenosis. C5-6: Bilateral uncovertebral hypertrophy. There is no spinal canal stenosis. Moderate right and severe left neural foraminal stenosis. C6-7: Left uncovertebral spurring. There is no spinal canal stenosis. Severe left neural foraminal stenosis. C7-T1: Disc space narrowing without herniation. There is no spinal canal stenosis. No neural foraminal stenosis. IMPRESSION: 1. Multifocal acute infarcts within the right MCA and PCA territories, including the right precentral gyrus. 2. Attenuated right ICA flow void at the skull base, likely stenosis. 3. Abnormal right vertebral artery V2 segment flow void, consistent with slow flow or occlusion. The distal right vertebral artery flow void is maintained. 4. Mild spinal canal stenosis and severe  right neural foraminal stenosis at C4-5. 5. Moderate right and severe left C5-6 and C6-7 neural foraminal stenosis. Electronically Signed   By: Juanetta Nordmann M.D.   On: 05/10/2023 00:47   CT HEAD WO CONTRAST ( ) Result Date: 05/09/2023 CLINICAL DATA:  Altered level of consciousness, intermittent left arm numbness for 1 month, left facial numbness since yesterday EXAM: CT HEAD WITHOUT CONTRAST TECHNIQUE: Contiguous axial images were obtained from the base of the skull through the vertex without intravenous contrast. RADIATION DOSE REDUCTION: This exam was performed according to the departmental dose-optimization program which includes automated exposure control, adjustment of the mA and/or kV according to patient size and/or use of iterative reconstruction technique. COMPARISON:  09/12/2018 FINDINGS: Brain: There is diffuse cerebral atrophy, likely appropriate for age. Extensive hypodensities are seen throughout the periventricular white matter compatible with chronic small vessel ischemic changes. No evidence of acute infarct or hemorrhage. Lateral ventricles and midline  structures are unremarkable. No acute extra-axial fluid collections. No mass effect. Vascular: No hyperdense vessel or unexpected calcification. Skull: Normal. Negative for fracture or focal lesion. Sinuses/Orbits: No acute finding. Other: None. IMPRESSION: 1. Chronic small-vessel ischemic changes and diffuse cerebral atrophy. 2. No acute intracranial process. Electronically Signed   By: Stephen Meza M.D.   On: 05/09/2023 17:23     Past medical hx Past Medical History:  Diagnosis Date   DUODENITIS, WITHOUT HEMORRHAGE 07/14/2007   ERECTILE DYSFUNCTION 07/14/2007   Esophageal stricture 03/19/2016   ESOPHAGITIS 07/14/2007   EXTERNAL HEMORRHOIDS 07/14/2007   GERD 07/14/2007   Gout    HYPERTENSION 07/14/2007   PERIPHERAL NEUROPATHY 07/14/2007   Peripheral neuropathy 04/06/2019   PERIPHERAL VASCULAR DISEASE 07/14/2007   PODAGRA 07/14/2007   Prostate cancer (HCC) 02/2012   pt had screening and then biopsy   Retinal tear of right eye 03/19/2016   TOBACCO ABUSE, HX OF 02/12/2010   TONSILLECTOMY, HX OF 07/14/2007   TREMOR, ESSENTIAL 07/14/2007   VITAMIN B12 DEFICIENCY 07/14/2007     Social History   Tobacco Use   Smoking status: Former    Current packs/day: 0.75    Average packs/day: 0.7 packs/day for 29.3 years (22.0 ttl pk-yrs)    Types: Cigarettes    Start date: 02/23/1994   Smokeless tobacco: Former  Building services engineer status: Never Used  Substance Use Topics   Alcohol use: Not Currently    Comment: d/c beer about 5 months ago   Drug use: No    Mr.Boan reports that he has quit smoking. His smoking use included cigarettes. He started smoking about 29 years ago. He has a 22 pack-year smoking history. He has quit using smokeless tobacco. He reports that he does not currently use alcohol. He reports that he does not use drugs.  Tobacco Cessation: Counseling given: Not Answered Patient does continue to smoke however is working on quitting.  Past surgical hx, Family hx,  Social hx all reviewed.  Current Outpatient Medications on File Prior to Visit  Medication Sig   allopurinol  (ZYLOPRIM ) 100 MG tablet TAKE 1 TABLET BY MOUTH EVERY DAY   amLODipine  (NORVASC ) 2.5 MG tablet Take 1 tablet (2.5 mg total) by mouth daily.   aspirin  EC 81 MG tablet Take 81 mg by mouth daily. Swallow whole.   clopidogrel  (PLAVIX ) 75 MG tablet Take 1 tablet (75 mg total) by mouth daily.   Cyanocobalamin  (B-12) 3000 MCG CAPS Take 3,000 mcg by mouth daily.   docusate sodium  (COLACE) 100 MG capsule Take 100 mg by mouth  once.   omeprazole  (PRILOSEC) 40 MG capsule TAKE 1 CAPSULE BY MOUTH EVERY DAY   polyethylene glycol (MIRALAX ) 17 g packet Take 17 g by mouth daily as needed for moderate constipation or severe constipation (not helped with stool softner).   oxybutynin  (DITROPAN -XL) 5 MG 24 hr tablet Take 1 tablet (5 mg total) by mouth at bedtime. (Patient not taking: Reported on 06/04/2023)   rosuvastatin  (CRESTOR ) 20 MG tablet Take 1 tablet (20 mg total) by mouth daily. (Patient not taking: Reported on 06/04/2023)   No current facility-administered medications on file prior to visit.     Allergies  Allergen Reactions   Nitrofuran Derivatives Rash   Ciprofloxacin Hcl Other (See Comments)    numbness    Review Of Systems:  Constitutional:   +  weight loss, night sweats,  Fevers, chills, ++fatigue, or  lassitude.  HEENT:   No headaches,  Difficulty swallowing,  Tooth/dental problems, or  Sore throat,                No sneezing, itching, ear ache, nasal congestion, post nasal drip,   CV:  No chest pain,  Orthopnea, PND, swelling in lower extremities, anasarca, dizziness, palpitations, syncope.   GI  No heartburn, indigestion, abdominal pain, nausea, vomiting, diarrhea, change in bowel habits, loss of appetite, bloody stools.   Resp: + shortness of breath with exertion less at rest.  + excess mucus,+ productive cough,  No non-productive cough,  + coughing up of blood.  + change in  color of mucus.  No wheezing.  No chest wall deformity  Skin: no rash or lesions.  GU: no dysuria, change in color of urine, no urgency or frequency.  No flank pain, no hematuria   MS:  + joint pain or swelling.  + decreased range of motion.  No back pain.  Psych:  No change in mood or affect. No depression or anxiety.  No memory loss.   Vital Signs BP 134/87 (BP Location: Right Arm, Patient Position: Sitting, Cuff Size: Normal)   Pulse (!) 103   Ht 6' (1.829 m)   Wt 155 lb 6.4 oz (70.5 kg)   SpO2 95%   BMI 21.08 kg/m    Physical Exam:  General- No distress,  A&Ox3,  ENT: No sinus tenderness, TM clear, pale nasal mucosa, no oral exudate,no post nasal drip, no LAN Cardiac: S1, S2, regular rate and rhythm, no murmur Chest: No wheeze/ rales/ dullness; no accessory muscle use, no nasal flaring, no sternal retractions, diminished per bases bilaterally, rhonchi which clears with cough Abd.: Soft Non-tender, nondistended, bowel sounds positive,Body mass index is 21.08 kg/m.  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  Two CT scans 03/16/2023 and 03/26/2023 showed a mass of  7.9x5.5 cm and 6.3x5.2 cm respectively. No associated symptoms.  History of bladder cancer in 2017. Mild weight loss over the past year. 05/09/2023 stroke and MRI demonstrating right hemispheric lesions secondary to symptomatic right ICA stenosis 05/14/2023 Right transcarotid artery revascularization-10x40 mm stent  Bronchoscopy and Biopsies delayed 2/2 stroke and need for subsequent dual anti platelet therapy. New Upper Respiratory Infection vs COPD Flare  post hospitalization 04/2023 Plan Your CXR shows atelectasis  and possibly and early pneumonia. I have sent in a prescription for Augmentin . Take 1 table in the morning and one in the evening x 7 days. Please take probiotic with antibiotic.  Either Activia yogurt twice daily, or Culturelle tablets daily.( [facility]) Continue using incentive spirometer multiple times daily as you have been doing. Continue Physical therapy I have called in Tessalon  Perles for cough. Use as needed up to every 6 hours. If these make you dizzy, stop taking them as I do not want to increase a risk of fall.  Follow up with Dr. Rosalva Comber as is scheduled 07/08/2023. Hopefully we will be able to stop his blood thinners to do the biopsy at that point. Follow up with me 07/09/2023 to re-evaluate pneumonia, and see if Dr. Rosalva Comber is in agreement with stopping anti-platelets at that time long enough to do a bronch with biopsies.  Call if you need us  sooner.  Remember oxygen saturations need to be > 88% at all times.  Please contact office for sooner follow up if symptoms do not improve or worsen or seek emergency care    Please work on quitting smoking. You can receive free nicotine replacement therapy (patches, gum, or mints) by calling 1-800-QUIT NOW. Please call so we can get you on the path to becoming a non-smoker. I know it is hard, but you can do this!  Hypnosis for smoking cessation  [employer] [phone]  Acupuncture for smoking cessation  [employer] [phone]    I spent 35 minutes dedicated to the care of this patient on the date of this encounter to include pre-visit review of records, face-to-face time with the patient discussing conditions above, post visit ordering of testing, clinical documentation with the electronic health record, making appropriate referrals as documented, and communicating necessary information to the patient's healthcare team.   3 minutes of today's  visit was spent on smoking cessation counseling.    Raejean Bullock, NP 06/04/2023  7:46 PM

## 2023-06-04 ENCOUNTER — Encounter: Payer: Self-pay | Admitting: Acute Care

## 2023-06-04 ENCOUNTER — Ambulatory Visit (INDEPENDENT_AMBULATORY_CARE_PROVIDER_SITE_OTHER)

## 2023-06-04 ENCOUNTER — Ambulatory Visit: Admitting: Acute Care

## 2023-06-04 ENCOUNTER — Telehealth: Payer: Self-pay | Admitting: Acute Care

## 2023-06-04 ENCOUNTER — Other Ambulatory Visit: Payer: Self-pay | Admitting: Acute Care

## 2023-06-04 VITALS — BP 134/87 | HR 103 | Ht 72.0 in | Wt 155.4 lb

## 2023-06-04 DIAGNOSIS — F17210 Nicotine dependence, cigarettes, uncomplicated: Secondary | ICD-10-CM | POA: Diagnosis not present

## 2023-06-04 DIAGNOSIS — R918 Other nonspecific abnormal finding of lung field: Secondary | ICD-10-CM | POA: Diagnosis not present

## 2023-06-04 DIAGNOSIS — Z87891 Personal history of nicotine dependence: Secondary | ICD-10-CM

## 2023-06-04 DIAGNOSIS — R911 Solitary pulmonary nodule: Secondary | ICD-10-CM

## 2023-06-04 DIAGNOSIS — J069 Acute upper respiratory infection, unspecified: Secondary | ICD-10-CM | POA: Diagnosis not present

## 2023-06-04 DIAGNOSIS — Z8673 Personal history of transient ischemic attack (TIA), and cerebral infarction without residual deficits: Secondary | ICD-10-CM

## 2023-06-04 DIAGNOSIS — R051 Acute cough: Secondary | ICD-10-CM | POA: Diagnosis not present

## 2023-06-04 MED ORDER — AMOXICILLIN-POT CLAVULANATE 875-125 MG PO TABS: 1.0000 | ORAL_TABLET | Freq: Two times a day (BID) | ORAL | 0 refills | Status: DC

## 2023-06-04 MED ORDER — BENZONATATE 100 MG PO CAPS: 100.0000 mg | ORAL_CAPSULE | Freq: Four times a day (QID) | ORAL | 0 refills | Status: DC | PRN

## 2023-06-04 NOTE — Patient Instructions (Addendum)
 It is good to see you today. Your CXR shows atelectasis and possibly and early pneumonia. I have sent in a prescription for Augmentin. Take 1 table in the morning and one in the evening x 7 days. Please take probiotic with antibiotic.  Either Activia yogurt twice daily, or Culturelle tablets daily.( Digestive health) Continue using incentive spirometer multiple times daily as you have been doing. Continue Physical therapy I have called in Tessalon Perles for cough. Use as needed up to every 6 hours. If these make you dizzy, stop taking them as I do not want to increase a risk of fall.  Follow up with Dr. Karin Lieu as is scheduled 07/08/2023. Hopefully we will be able to stop his blood thinners to do the biopsy at that point. Follow up with me 07/09/2023 to re-evaluate pneumonia, and see if Dr. Karin Lieu is ok with stopping anti-platelets at that time long enough to do a bronch with biopsies.  Call if you need [place] sooner.  Remember oxygen saturations need to be > 88% at all times.  Please contact office for sooner follow up if symptoms do not improve or worsen or seek emergency care

## 2023-06-04 NOTE — Telephone Encounter (Signed)
 Kandice Robinsons NP requested follow up pneumonia appt be scheduled for 07/09/23 and to leave message for patient.  Appt made and left VM confirming date/time and arrival time for follow up pneumonia visit.  Asked patient to call back if time needs to be adjusted

## 2023-06-07 ENCOUNTER — Other Ambulatory Visit: Payer: Self-pay | Admitting: Internal Medicine

## 2023-06-07 ENCOUNTER — Other Ambulatory Visit: Payer: Self-pay | Admitting: Orthopedic Surgery

## 2023-06-07 ENCOUNTER — Other Ambulatory Visit: Payer: Self-pay

## 2023-06-07 DIAGNOSIS — Z7902 Long term (current) use of antithrombotics/antiplatelets: Secondary | ICD-10-CM | POA: Diagnosis not present

## 2023-06-07 DIAGNOSIS — M4802 Spinal stenosis, cervical region: Secondary | ICD-10-CM | POA: Diagnosis not present

## 2023-06-07 DIAGNOSIS — M109 Gout, unspecified: Secondary | ICD-10-CM | POA: Diagnosis not present

## 2023-06-07 DIAGNOSIS — G720 Drug-induced myopathy: Secondary | ICD-10-CM | POA: Diagnosis not present

## 2023-06-07 DIAGNOSIS — I10 Essential (primary) hypertension: Secondary | ICD-10-CM | POA: Diagnosis not present

## 2023-06-07 DIAGNOSIS — Z8551 Personal history of malignant neoplasm of bladder: Secondary | ICD-10-CM | POA: Diagnosis not present

## 2023-06-07 DIAGNOSIS — Z48812 Encounter for surgical aftercare following surgery on the circulatory system: Secondary | ICD-10-CM | POA: Diagnosis not present

## 2023-06-07 DIAGNOSIS — I69354 Hemiplegia and hemiparesis following cerebral infarction affecting left non-dominant side: Secondary | ICD-10-CM | POA: Diagnosis not present

## 2023-06-07 DIAGNOSIS — R911 Solitary pulmonary nodule: Secondary | ICD-10-CM | POA: Diagnosis not present

## 2023-06-07 DIAGNOSIS — I499 Cardiac arrhythmia, unspecified: Secondary | ICD-10-CM | POA: Diagnosis not present

## 2023-06-07 DIAGNOSIS — D519 Vitamin B12 deficiency anemia, unspecified: Secondary | ICD-10-CM | POA: Diagnosis not present

## 2023-06-07 DIAGNOSIS — M1990 Unspecified osteoarthritis, unspecified site: Secondary | ICD-10-CM | POA: Diagnosis not present

## 2023-06-09 ENCOUNTER — Encounter: Payer: Self-pay | Admitting: Family Medicine

## 2023-06-09 ENCOUNTER — Ambulatory Visit: Admitting: Family Medicine

## 2023-06-09 VITALS — BP 122/70 | Temp 98.1°F | Ht 72.0 in | Wt 155.6 lb

## 2023-06-09 DIAGNOSIS — K5909 Other constipation: Secondary | ICD-10-CM

## 2023-06-09 DIAGNOSIS — M25561 Pain in right knee: Secondary | ICD-10-CM

## 2023-06-09 DIAGNOSIS — G720 Drug-induced myopathy: Secondary | ICD-10-CM | POA: Diagnosis not present

## 2023-06-09 DIAGNOSIS — Z8673 Personal history of transient ischemic attack (TIA), and cerebral infarction without residual deficits: Secondary | ICD-10-CM

## 2023-06-09 DIAGNOSIS — R531 Weakness: Secondary | ICD-10-CM

## 2023-06-09 DIAGNOSIS — M25562 Pain in left knee: Secondary | ICD-10-CM

## 2023-06-09 DIAGNOSIS — E782 Mixed hyperlipidemia: Secondary | ICD-10-CM | POA: Diagnosis not present

## 2023-06-09 DIAGNOSIS — R300 Dysuria: Secondary | ICD-10-CM | POA: Diagnosis not present

## 2023-06-09 DIAGNOSIS — G8929 Other chronic pain: Secondary | ICD-10-CM | POA: Diagnosis not present

## 2023-06-09 DIAGNOSIS — T466X5A Adverse effect of antihyperlipidemic and antiarteriosclerotic drugs, initial encounter: Secondary | ICD-10-CM | POA: Diagnosis not present

## 2023-06-09 DIAGNOSIS — R918 Other nonspecific abnormal finding of lung field: Secondary | ICD-10-CM

## 2023-06-09 DIAGNOSIS — M170 Bilateral primary osteoarthritis of knee: Secondary | ICD-10-CM

## 2023-06-09 MED ORDER — DOCUSATE SODIUM 100 MG PO CAPS: 100.0000 mg | ORAL_CAPSULE | Freq: Once | ORAL | 1 refills | Status: AC

## 2023-06-09 MED ORDER — DICLOFENAC SODIUM 1 % EX GEL
4.0000 g | Freq: Four times a day (QID) | CUTANEOUS | 3 refills | Status: DC
Start: 2023-06-09 — End: 2023-11-15

## 2023-06-09 MED ORDER — EZETIMIBE 10 MG PO TABS: 10.0000 mg | ORAL_TABLET | Freq: Every day | ORAL | 1 refills | Status: DC

## 2023-06-09 NOTE — Progress Notes (Signed)
 Established Patient Office Visit  Subjective   Patient ID: Stephen Meza, male    DOB: 07/08/1933  Age: 89 y.o. MRN: [id number]  Chief Complaint  Patient presents with   Follow-up    Joint pain has gotten better, however still present    HPI Patient presents today for medication management. Stopped statin 2 weeks ago r/t worsening joint pain. Drug holiday has proven beneficial for joint pain. Will consider another lipid lowering agent.  Also started ditropan  for nocturnal urinary frequency. States that this was not helpful, stopped taking. Has since reported dysuria for the last 3-4 days. No gross hematuria, abdominal pain, nausea, vomiting, diarrhea, rash, fever, other symptoms.  Has lost weight, 11# since January. Saw pulmonology with Dara Ear, NP on 06/04/23 and was diagnosed with URI, treated with Augmentin . Still taking, has 2 days remaining to complete course.  Reports improvement.   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 Has follow up scheduled with pulm to biopsy once he has completed plavix  course.  Reports bilateral knee pain with ambulation, has previously attributed this to neuropathy in the past. Has taken tylenol  with little relief.  Reports compliance with medication regimen. Denies the need for refills. Not fasting today. Denies other concerns. Medical history as outlined below.  ROS Per HPI    Objective:     BP 122/70 (BP Location: Left Arm, Patient Position: Sitting)   Temp 98.1 F (36.7 C) (Temporal)   Ht 6' (1.829 m)   Wt 155 lb 9.6 oz (70.6 kg)   BMI 21.10 kg/m   Physical Exam Vitals and nursing note reviewed.  Constitutional:      General: He is not in acute distress.    Appearance: Normal appearance.     Comments: Appears fatigued, but overall improved from last visit  HENT:     Head: Normocephalic and atraumatic.     Right Ear: External ear normal.     Left Ear: External ear normal.     Nose: Nose  normal.     Mouth/Throat:     Mouth: Mucous membranes are moist.     Pharynx: Oropharynx is clear.  Eyes:     Extraocular Movements: Extraocular movements intact.  Cardiovascular:     Rate and Rhythm: Normal rate and regular rhythm.     Pulses: Normal pulses.     Heart sounds: Normal heart sounds.  Pulmonary:     Effort: Pulmonary effort is normal. No respiratory distress.     Breath sounds: Normal breath sounds. No wheezing, rhonchi or rales.  Musculoskeletal:     Cervical back: Normal range of motion.     Right lower leg: No edema.     Left lower leg: No edema.     Comments: Generalized muscle weakness   Lymphadenopathy:     Cervical: No cervical adenopathy.  Skin:    General: Skin is warm and dry.  Neurological:     General: No focal deficit present.     Mental Status: He is alert and oriented to person, place, and time.     Sensory: Sensory deficit present.     Motor: Weakness present.     Coordination: Coordination abnormal.     Gait: Gait abnormal.     Comments: Using walker today, moving slowly. Unsteady gait.   Psychiatric:        Mood and Affect: Mood normal.        Behavior: Behavior normal.      Results for orders  placed or performed in visit on 06/09/23  Urine Culture   Specimen: Urine  Result Value Ref Range   Source: URINE    Status: FINAL    Result: No Growth      The ASCVD Risk score (Arnett DK, et al., 2019) failed to calculate for the following reasons:   The 2019 ASCVD risk score is only valid for ages 70 to 56   Risk score cannot be calculated because patient has a medical history suggesting prior/existing ASCVD    Assessment & Plan:   Mass of right lung Assessment & Plan: F/u with pulm as scheduled   Mixed hyperlipidemia Assessment & Plan: Cannot tolerate statin Continue plavix  for now Start zetia    Orders: -     Ezetimibe ; Take 1 tablet (10 mg total) by mouth daily.  Dispense: 30 tablet; Refill: 1  Statin myopathy Assessment  & Plan: D/c statin, start zetia   Orders: -     Ezetimibe ; Take 1 tablet (10 mg total) by mouth daily.  Dispense: 30 tablet; Refill: 1  Chronic constipation Assessment & Plan: Controlled, may continue miralax  prn  Orders: -     Docusate Sodium ; Take 1 capsule (100 mg total) by mouth once for 1 dose.  Dispense: 90 capsule; Refill: 1  Primary osteoarthritis of both knees Assessment & Plan: Trial voltaren  gel  Orders: -     Diclofenac  Sodium; Apply 4 g topically 4 (four) times daily.  Dispense: 150 g; Refill: 3  Chronic pain of both knees Assessment & Plan: Continue PT, trial voltaren   Orders: -     Diclofenac  Sodium; Apply 4 g topically 4 (four) times daily.  Dispense: 150 g; Refill: 3  Dysuria Assessment & Plan: Urine culture today, no UA, currently taking augmentin  for URI  Orders: -     Urine Culture; Future  History of stroke Assessment & Plan: F/u with neuro as scheduled Discussed that good BP, cholesterol control will help prevent further events   Generalized weakness Assessment & Plan: Continue activity with walker Continue PT      Return if symptoms worsen or fail to improve, for as scheduled, weight recheck.    Wellington Half, FNP

## 2023-06-09 NOTE — Patient Instructions (Addendum)
 I have sent in ezetimibe for you to take for cholesterol.  Recommend adding a protein shake at least once daily  STOP ditropan  Continue Augmentin as prescribed.  Continue PT at home.  I have sent in diclofenac gel for your knees. May use this topically every 6 hours as needed for pain.  We have sent your urine in for a culture. We will be in contact with any results that require further attention.  Follow up as scheduled.

## 2023-06-10 DIAGNOSIS — Z7902 Long term (current) use of antithrombotics/antiplatelets: Secondary | ICD-10-CM | POA: Diagnosis not present

## 2023-06-10 DIAGNOSIS — M1990 Unspecified osteoarthritis, unspecified site: Secondary | ICD-10-CM | POA: Diagnosis not present

## 2023-06-10 DIAGNOSIS — I499 Cardiac arrhythmia, unspecified: Secondary | ICD-10-CM | POA: Diagnosis not present

## 2023-06-10 DIAGNOSIS — Z8551 Personal history of malignant neoplasm of bladder: Secondary | ICD-10-CM | POA: Diagnosis not present

## 2023-06-10 DIAGNOSIS — M4802 Spinal stenosis, cervical region: Secondary | ICD-10-CM | POA: Diagnosis not present

## 2023-06-10 DIAGNOSIS — M109 Gout, unspecified: Secondary | ICD-10-CM | POA: Diagnosis not present

## 2023-06-10 DIAGNOSIS — I69354 Hemiplegia and hemiparesis following cerebral infarction affecting left non-dominant side: Secondary | ICD-10-CM | POA: Diagnosis not present

## 2023-06-10 DIAGNOSIS — I10 Essential (primary) hypertension: Secondary | ICD-10-CM | POA: Diagnosis not present

## 2023-06-10 DIAGNOSIS — Z48812 Encounter for surgical aftercare following surgery on the circulatory system: Secondary | ICD-10-CM | POA: Diagnosis not present

## 2023-06-10 DIAGNOSIS — R911 Solitary pulmonary nodule: Secondary | ICD-10-CM | POA: Diagnosis not present

## 2023-06-10 DIAGNOSIS — D519 Vitamin B12 deficiency anemia, unspecified: Secondary | ICD-10-CM | POA: Diagnosis not present

## 2023-06-10 DIAGNOSIS — G720 Drug-induced myopathy: Secondary | ICD-10-CM | POA: Diagnosis not present

## 2023-06-10 LAB — URINE CULTURE: Result:: NO GROWTH

## 2023-06-12 ENCOUNTER — Other Ambulatory Visit: Payer: Self-pay | Admitting: Family Medicine

## 2023-06-12 DIAGNOSIS — N138 Other obstructive and reflux uropathy: Secondary | ICD-10-CM

## 2023-06-16 ENCOUNTER — Ambulatory Visit: Admitting: Adult Health

## 2023-06-16 ENCOUNTER — Encounter: Payer: Self-pay | Admitting: Adult Health

## 2023-06-16 VITALS — BP 138/84 | HR 93 | Ht 70.0 in | Wt 160.0 lb

## 2023-06-16 DIAGNOSIS — I63231 Cerebral infarction due to unspecified occlusion or stenosis of right carotid arteries: Secondary | ICD-10-CM

## 2023-06-16 DIAGNOSIS — R918 Other nonspecific abnormal finding of lung field: Secondary | ICD-10-CM

## 2023-06-16 DIAGNOSIS — Z9862 Peripheral vascular angioplasty status: Secondary | ICD-10-CM

## 2023-06-16 NOTE — Patient Instructions (Signed)
 Continue working with home health therapies - once completed, if you are interested in pursing additional therapy, please let me know  Continue aspirin  81 mg daily  and Zetia   for secondary stroke prevention  Follow up with vascular surgery as scheduled next month, ongoing duration of Plavix  will be determined by Dr. Christia Cowboy  Continue to follow up with PCP regarding blood pressure and cholesterol management  Maintain strict control of hypertension with blood pressure goal below 130/90 and cholesterol with LDL cholesterol (bad cholesterol) goal below 70 mg/dL.   Signs of a Stroke? Follow the BEFAST method:  Balance Watch for a sudden loss of balance, trouble with coordination or vertigo Eyes Is there a sudden loss of vision in one or both eyes? Or double vision?  Face: Ask the person to smile. Does one side of the face droop or is it numb?  Arms: Ask the person to raise both arms. Does one arm drift downward? Is there weakness or numbness of a leg? Speech: Ask the person to repeat a simple phrase. Does the speech sound slurred/strange? Is the person confused ? Time: If you observe any of these signs, call 911.      Thank you for coming to see us  at [facility]. I hope we have been able to provide you high quality care today.  You may receive a patient satisfaction survey over the next few weeks. We would appreciate your feedback and comments so that we may continue to improve ourselves and the health of our patients.

## 2023-06-16 NOTE — Progress Notes (Signed)
 [facility] [address] [place]. [place] [address] [phone]       HOSPITAL FOLLOW UP NOTE  [name]. Fawver Date of Birth:  1933-12-04 Medical Record Number:  [id number]   Reason for Referral:  hospital stroke follow up    SUBJECTIVE:   CHIEF COMPLAINT:  Chief Complaint  Patient presents with   Cerebrovascular Accident    Rm 3 with daughter and spouse Pt is well, reports he is having trouble with walking due to weakness and numbness but does have neuropathy as well.  No other concerns     HPI:   [name]. Stephen Meza is a 89 y.o. male with history of hypertension, gout, prior history of bladder cancer, lung nodule scheduled for lung biopsy 05/10/2023, B12 deficiency neuropathy who presented to the ED on 05/09/2023 for numbness involving his left face, left upper and lower extremity ongoing for last few weeks.  Stroke workup revealed right MCA infarcts likely due to symptomatic right ICA high-grade stenosis s/p TCAR.  Placed on aspirin  and Plavix  and advised outpatient VVS f/u.  LDL 95, initiated atorvastatin  40 mg daily.  Therapies recommended home health therapy.   Today, 06/16/2023, patient is being seen for initial hospital follow-up accompanied by his wife and daughter.  Reports overall doing well since discharge.  Currently working with home health therapies and noting gradual improvement of left-sided weakness and gait difficulty.  Currently using rolling walker, no recent falls, previously ambulating with a cane.  He also notes some double vision when trying to read but has been gradually improving since discharge.  No new stroke/TIA symptoms.  Has remained on both aspirin  and Plavix  without side effects.  Atorvastatin  discontinued due to myalgias and has since transitioned to Zetia  by PCP which he has been tolerating well.  Blood pressure has been well-controlled.  Routinely follows with PCP.  He has also been closely followed by pulmonology for  lung mass, previously scheduled biopsy placed on hold in setting of recent stroke.  Notes worsening dyspnea on exertion and gradual weight loss.  Pulmonology is wanting to proceed with biopsy for suspected lung cancer and is currently awaiting on vascular surgery follow-up scheduled on 5/15 for further clearance.     PERTINENT IMAGING  CT head No acute abnormality. Small vessel disease. Atrophy.  CTA head & neck Severe atheromatous stenosis at the proximal right ICA with short segment string sign and intracranial relative underfilling. MRI right scattered MCA infarcts, more consistent with watershed fusion 2D Echo EF 55 to 60%.  Mild concentric LVH with grade 1 diastolic dysfunction LDL 95 HgbA1c 5.5    ROS:   14 system review of systems performed and negative with exception of those listed in HPI  PMH:  Past Medical History:  Diagnosis Date   DUODENITIS, WITHOUT HEMORRHAGE 07/14/2007   ERECTILE DYSFUNCTION 07/14/2007   Esophageal stricture 03/19/2016   ESOPHAGITIS 07/14/2007   EXTERNAL HEMORRHOIDS 07/14/2007   GERD 07/14/2007   Gout    HYPERTENSION 07/14/2007   PERIPHERAL NEUROPATHY 07/14/2007   Peripheral neuropathy 04/06/2019   PERIPHERAL VASCULAR DISEASE 07/14/2007   PODAGRA 07/14/2007   Prostate cancer (HCC) 02/2012   pt had screening and then biopsy   Retinal tear of right eye 03/19/2016   TOBACCO ABUSE, HX OF 02/12/2010   TONSILLECTOMY, HX OF 07/14/2007   TREMOR, ESSENTIAL 07/14/2007   VITAMIN B12 DEFICIENCY 07/14/2007    PSH:  Past Surgical History:  Procedure Laterality Date   Excision of lipoma  PROSTATE BIOPSY Bilateral 03/10/2012   surgery to reduce turbinate and straighten deviated septum  2016   TONSILLECTOMY     TRANSCAROTID ARTERY REVASCULARIZATION  Right 05/14/2023   Procedure: TRANSCAROTID ARTERY REVASCULARIZATION (TCAR) UISNG 10mm X 40mm ENROUTE TRANSCAROTID STENT SYSTEM;  Surgeon: Kayla Part, MD;  Location: [facility] OR;  Service: Vascular;   Laterality: Right;    Social History:  Social History   Socioeconomic History   Marital status: Married    Spouse name: Not on file   Number of children: 2   Years of education: 12   Highest education level: Not on file  Occupational History   Occupation: retired    Associate Professor: RETIRED  Tobacco Use   Smoking status: Former    Current packs/day: 0.75    Average packs/day: 0.7 packs/day for 29.3 years (22.0 ttl pk-yrs)    Types: Cigarettes    Start date: 02/23/1994   Smokeless tobacco: Former  Building services engineer status: Never Used  Substance and Sexual Activity   Alcohol use: Not Currently    Comment: d/c beer about 5 months ago   Drug use: No   Sexual activity: Yes    Partners: Female  Other Topics Concern   Not on file  Social History Narrative   HSG. Married '1958. 2 dtrs; 4 g-children. Work - retired. 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       Not disabled      Retired since 1986      Right handed      Two story home   Social Drivers of Health   Financial Resource Strain: Low Risk  (08/04/2022)   Overall Financial Resource Strain (CARDIA)    Difficulty of Paying Living Expenses: Not hard at all  Food Insecurity: No Food Insecurity (05/10/2023)   Hunger Vital Sign    Worried About Running Out of Food in the Last Year: Never true    Ran Out of Food in the Last Year: Never true  Transportation Needs: No Transportation Needs (05/10/2023)   PRAPARE - Administrator, Civil Service (Medical): No    Lack of Transportation (Non-Medical): No  Physical Activity: Sufficiently Active (08/04/2022)   Exercise Vital Sign    Days of Exercise per Week: 5 days    Minutes of Exercise per Session: 30 min  Stress: No Stress Concern Present (08/04/2022)   [employer] of Occupational Health - Occupational Stress Questionnaire    Feeling of Stress : Not at all  Social Connections: Socially Integrated  (05/10/2023)   Social Connection and Isolation Panel [NHANES]    Frequency of Communication with Friends and Family: More than three times a week    Frequency of Social Gatherings with Friends and Family: Twice a week    Attends Religious Services: More than 4 times per year    Active Member of [employer] or Organizations: Yes    Attends Engineer, structural: More than 4 times per year    Marital Status: Married  Catering manager Violence: Not At Risk (05/10/2023)   Humiliation, Afraid, Rape, and Kick questionnaire    Fear of Current or Ex-Partner: No    Emotionally Abused: No    Physically Abused: No    Sexually Abused: No    Family History:  Family History  Problem Relation Age of Onset   Hypothyroidism Daughter    Breast cancer Other    Cancer  Mother    Other Father    Breast cancer Sister     Medications:   Current Outpatient Medications on File Prior to Visit  Medication Sig Dispense Refill   allopurinol  (ZYLOPRIM ) 100 MG tablet TAKE 1 TABLET BY MOUTH EVERY DAY 90 tablet 4   amLODipine  (NORVASC ) 2.5 MG tablet TAKE 1 TABLET BY MOUTH EVERY DAY 90 tablet 3   aspirin  EC 81 MG tablet Take 81 mg by mouth daily. Swallow whole.     benzonatate  (TESSALON  PERLES) 100 MG capsule Take 1 capsule (100 mg total) by mouth every 6 (six) hours as needed for cough. 30 capsule 0   clopidogrel  (PLAVIX ) 75 MG tablet Take 1 tablet (75 mg total) by mouth daily. 30 tablet 2   Cyanocobalamin  (B-12) 3000 MCG CAPS Take 3,000 mcg by mouth daily.     diclofenac  Sodium (VOLTAREN ) 1 % GEL Apply 4 g topically 4 (four) times daily. 150 g 3   ezetimibe  (ZETIA ) 10 MG tablet Take 1 tablet (10 mg total) by mouth daily. 30 tablet 1   omeprazole  (PRILOSEC) 40 MG capsule TAKE 1 CAPSULE BY MOUTH EVERY DAY 90 capsule 2   polyethylene glycol (MIRALAX ) 17 g packet Take 17 g by mouth daily as needed for moderate constipation or severe constipation (not helped with stool softner). 10 each 0   No current  facility-administered medications on file prior to visit.    Allergies:   Allergies  Allergen Reactions   Nitrofuran Derivatives Rash   Ciprofloxacin Hcl Other (See Comments)    numbness   Statins     Intolerance, joint and muscle pain      OBJECTIVE:  Physical Exam  Vitals:   06/16/23 1003  BP: 138/84  Pulse: 93  Weight: 160 lb (72.6 kg)  Height: 5\' 10"  (1.778 m)   Body mass index is 22.96 kg/m. No results found.   General: well developed, well nourished, very pleasant elderly Caucasian male, seated, in no evident distress  Neurologic Exam Mental Status: Awake and fully alert.  Fluent speech and language.  Oriented to place and time. Recent and remote memory intact. Attention span, concentration and fund of knowledge appropriate. Mood and affect appropriate.  Cranial Nerves: Fundoscopic exam reveals sharp disc margins. Pupils equal, briskly reactive to light. Extraocular movements full without nystagmus, mild convergence insufficiency. Visual fields full to confrontation.  HOH bilaterally. Facial sensation intact.  Slight left nasolabial fold flattening.  Tongue, palate moves normally and symmetrically.  Motor: Normal strength, bulk and tone right upper and lower extremity.  LUE: 4+/5 with decreased hand dexterity; LLE: 4/5 HF, ADF, 4+/5 KE, KF Sensory.: intact to touch , pinprick , position and vibratory sensation.  Coordination: Rapid alternating movements normal on right side. Finger-to-nose and heel-to-shin mild incoordination on left consistent with weakness. Gait and Station: Arises from chair without difficulty. Stance is hunched. Gait demonstrates decreased stride length and step height LLE>RLE, mild imbalance, use of RW Reflexes: 1+ and symmetric. Toes downgoing.      NIHSS  2 Modified Rankin  3      ASSESSMENT: Stephen Meza is a 89 y.o. year old male with right MCA infarcts on 05/09/2023 due to symptomatic right ICA high-grade stenosis s/p TCAR.  Vascular risk factors include right carotid stenosis, HTN, HLD, former tobacco and alcohol abuse.      PLAN:  Right MCA infarcts:  Residual deficit: Mild left hemiparesis and gait impairment.  Continue working with home health therapies, consider transition to outpatient therapies  once completed.  Continued use of rolling walker at all times unless otherwise instructed. Continue aspirin  81mg  daily and Zetia  10mg  daily for secondary stroke prevention and post TCAR managed/prescribed by PCP. Intolerant to atorvastatin . Discussed secondary stroke prevention measures and importance of close PCP follow up for aggressive stroke risk factor management including BP goal<130/90, HLD with LDL goal<70 and DM with A1c.<7 .  Stroke labs 04/2023: LDL 95, A1c 5.5 I have gone over the pathophysiology of stroke, warning signs and symptoms, risk factors and their management in some detail with instructions to go to the closest emergency room for symptoms of concern.  Right carotid stenosis: S/p TCAR Advised ongoing duration of DAPT will be determined by VVS as prolonged DAPT not indicated from stroke standpoint F/u visit with VVS next month  Mass of right lung Previously scheduled for biopsy 3/17 but now on hold d/t stroke Has been having worsening dyspnea on exertion and weight loss, pulmonology concerned in setting of what is anticipated being a lung cancer Discussed primarily from a stroke standpoint, it is typically recommended to wait 3 to 6 months post stroke for any type of procedure especially those requiring holding of blood thinners but based on indication for procedure, I do believe benefit would outweigh potential risk if blood thinner is needing to be held prior to that 22-month timeframe.  As he is currently on DAPT post TCAR, I would not recommend holding these medications until further discussion with VVS Dr. Christia Cowboy, f/u visit scheduled 5/15. Will reach out to Dr. Christia Cowboy for further input,  possibly sooner visit if available as he has since completed 4 weeks of DAPT, would need this biopsy sooner than later to evaluate for suspected lung cancer but will also need to ensure safety of pursuing procedure which family and patient understand    No further recommendations from neurological standpoint and is closely being followed by PCP for stroke risk factor management.  Patient can follow-up on an as-needed basis which patient and family agreed with but advised to call with any questions or concerns in the future   CC:  [facility] provider: Dr. Janett Medin PCP: Wellington Half, FNP    I spent a prolonged 70 minutes of face-to-face and non-face-to-face time with patient and family.  This included previsit chart review, lab review, study review, electronic health record documentation, patient and family education and discussion regarding above diagnoses and treatment plan and answered all other questions to patient and family's satisfaction  Stephen Meza, [facility]  [facility] [address] [place], [place] [address]  Phone [phone] Fax [phone] Note: This document was prepared with digital dictation and possible smart phrase technology. Any transcriptional errors that result from this process are unintentional.

## 2023-06-16 NOTE — Progress Notes (Signed)
 Okay, great, thank you! I did advise him that he would need to stay on aspirin  once plavix  completed but would you be okay with him holding the aspirin  to undergo the biopsy or would you prefer to wait until you see him in 1 month?

## 2023-06-17 DIAGNOSIS — G720 Drug-induced myopathy: Secondary | ICD-10-CM | POA: Diagnosis not present

## 2023-06-17 DIAGNOSIS — I69354 Hemiplegia and hemiparesis following cerebral infarction affecting left non-dominant side: Secondary | ICD-10-CM | POA: Diagnosis not present

## 2023-06-17 DIAGNOSIS — I499 Cardiac arrhythmia, unspecified: Secondary | ICD-10-CM | POA: Diagnosis not present

## 2023-06-17 DIAGNOSIS — Z8551 Personal history of malignant neoplasm of bladder: Secondary | ICD-10-CM | POA: Diagnosis not present

## 2023-06-17 DIAGNOSIS — R911 Solitary pulmonary nodule: Secondary | ICD-10-CM | POA: Diagnosis not present

## 2023-06-17 DIAGNOSIS — Z48812 Encounter for surgical aftercare following surgery on the circulatory system: Secondary | ICD-10-CM | POA: Diagnosis not present

## 2023-06-17 DIAGNOSIS — Z7902 Long term (current) use of antithrombotics/antiplatelets: Secondary | ICD-10-CM | POA: Diagnosis not present

## 2023-06-17 DIAGNOSIS — I10 Essential (primary) hypertension: Secondary | ICD-10-CM | POA: Diagnosis not present

## 2023-06-17 DIAGNOSIS — M1990 Unspecified osteoarthritis, unspecified site: Secondary | ICD-10-CM | POA: Diagnosis not present

## 2023-06-17 DIAGNOSIS — M109 Gout, unspecified: Secondary | ICD-10-CM | POA: Diagnosis not present

## 2023-06-17 DIAGNOSIS — D519 Vitamin B12 deficiency anemia, unspecified: Secondary | ICD-10-CM | POA: Diagnosis not present

## 2023-06-17 DIAGNOSIS — M4802 Spinal stenosis, cervical region: Secondary | ICD-10-CM | POA: Diagnosis not present

## 2023-06-18 DIAGNOSIS — G8929 Other chronic pain: Secondary | ICD-10-CM | POA: Insufficient documentation

## 2023-06-18 DIAGNOSIS — Z8673 Personal history of transient ischemic attack (TIA), and cerebral infarction without residual deficits: Secondary | ICD-10-CM | POA: Insufficient documentation

## 2023-06-18 DIAGNOSIS — R531 Weakness: Secondary | ICD-10-CM | POA: Insufficient documentation

## 2023-06-18 DIAGNOSIS — K5909 Other constipation: Secondary | ICD-10-CM | POA: Insufficient documentation

## 2023-06-18 DIAGNOSIS — R300 Dysuria: Secondary | ICD-10-CM | POA: Insufficient documentation

## 2023-06-18 DIAGNOSIS — M170 Bilateral primary osteoarthritis of knee: Secondary | ICD-10-CM | POA: Insufficient documentation

## 2023-06-18 DIAGNOSIS — G720 Drug-induced myopathy: Secondary | ICD-10-CM | POA: Insufficient documentation

## 2023-06-18 NOTE — Assessment & Plan Note (Signed)
 D/c statin, start zetia 

## 2023-06-18 NOTE — Assessment & Plan Note (Signed)
 Controlled, may continue miralax  prn

## 2023-06-18 NOTE — Assessment & Plan Note (Signed)
Trial voltaren gel

## 2023-06-18 NOTE — Assessment & Plan Note (Signed)
 Urine culture today, no UA, currently taking augmentin  for URI

## 2023-06-18 NOTE — Assessment & Plan Note (Signed)
 Cannot tolerate statin Continue plavix  for now Start zetia 

## 2023-06-18 NOTE — Assessment & Plan Note (Signed)
 Continue activity with walker Continue PT

## 2023-06-18 NOTE — Progress Notes (Signed)
 I agree with the above plan

## 2023-06-18 NOTE — Assessment & Plan Note (Signed)
 F/u with pulm as scheduled

## 2023-06-18 NOTE — Assessment & Plan Note (Signed)
 F/u with neuro as scheduled Discussed that good BP, cholesterol control will help prevent further events

## 2023-06-18 NOTE — Assessment & Plan Note (Signed)
 Continue PT, trial voltaren 

## 2023-06-22 ENCOUNTER — Other Ambulatory Visit

## 2023-06-24 DIAGNOSIS — R911 Solitary pulmonary nodule: Secondary | ICD-10-CM | POA: Diagnosis not present

## 2023-06-24 DIAGNOSIS — D519 Vitamin B12 deficiency anemia, unspecified: Secondary | ICD-10-CM | POA: Diagnosis not present

## 2023-06-24 DIAGNOSIS — Z8551 Personal history of malignant neoplasm of bladder: Secondary | ICD-10-CM | POA: Diagnosis not present

## 2023-06-24 DIAGNOSIS — Z48812 Encounter for surgical aftercare following surgery on the circulatory system: Secondary | ICD-10-CM | POA: Diagnosis not present

## 2023-06-24 DIAGNOSIS — I499 Cardiac arrhythmia, unspecified: Secondary | ICD-10-CM | POA: Diagnosis not present

## 2023-06-24 DIAGNOSIS — M1990 Unspecified osteoarthritis, unspecified site: Secondary | ICD-10-CM | POA: Diagnosis not present

## 2023-06-24 DIAGNOSIS — I69354 Hemiplegia and hemiparesis following cerebral infarction affecting left non-dominant side: Secondary | ICD-10-CM | POA: Diagnosis not present

## 2023-06-24 DIAGNOSIS — I10 Essential (primary) hypertension: Secondary | ICD-10-CM | POA: Diagnosis not present

## 2023-06-24 DIAGNOSIS — M4802 Spinal stenosis, cervical region: Secondary | ICD-10-CM | POA: Diagnosis not present

## 2023-06-24 DIAGNOSIS — Z7902 Long term (current) use of antithrombotics/antiplatelets: Secondary | ICD-10-CM | POA: Diagnosis not present

## 2023-06-24 DIAGNOSIS — G720 Drug-induced myopathy: Secondary | ICD-10-CM | POA: Diagnosis not present

## 2023-06-24 DIAGNOSIS — M109 Gout, unspecified: Secondary | ICD-10-CM | POA: Diagnosis not present

## 2023-06-25 ENCOUNTER — Ambulatory Visit: Admitting: Family Medicine

## 2023-06-25 NOTE — Progress Notes (Deleted)
   Established Patient Office Visit  Subjective   Patient ID: Stephen Meza A. Fien, male    DOB: December 14, 1933  Age: 89 y.o. MRN: [id number]  No chief complaint on file.   HPI Patient presents today for medication management. Reports compliance with medication regimen. Denies the need for refills. Not fasting today. Denies other concerns. Medical history as outlined below.  ROS Per HPI    Objective:     There were no vitals taken for this visit.  Physical Exam Vitals and nursing note reviewed.  Constitutional:      General: He is not in acute distress.    Appearance: Normal appearance.     Comments: Appears fatigued, but overall improved from last visit  HENT:     Head: Normocephalic and atraumatic.     Right Ear: External ear normal.     Left Ear: External ear normal.     Nose: Nose normal.     Mouth/Throat:     Mouth: Mucous membranes are moist.     Pharynx: Oropharynx is clear.  Eyes:     Extraocular Movements: Extraocular movements intact.  Cardiovascular:     Rate and Rhythm: Normal rate and regular rhythm.     Pulses: Normal pulses.     Heart sounds: Normal heart sounds.  Pulmonary:     Effort: Pulmonary effort is normal. No respiratory distress.     Breath sounds: Normal breath sounds. No wheezing, rhonchi or rales.  Musculoskeletal:     Cervical back: Normal range of motion.     Right lower leg: No edema.     Left lower leg: No edema.     Comments: Generalized muscle weakness   Lymphadenopathy:     Cervical: No cervical adenopathy.  Skin:    General: Skin is warm and dry.  Neurological:     General: No focal deficit present.     Mental Status: He is alert and oriented to person, place, and time.     Sensory: Sensory deficit present.     Motor: Weakness present.     Coordination: Coordination abnormal.     Gait: Gait abnormal.     Comments: Using walker today, moving slowly. Unsteady gait.   Psychiatric:        Mood and Affect: Mood normal.         Behavior: Behavior normal.    No results found for any visits on 06/25/23.   The ASCVD Risk score (Arnett DK, et al., 2019) failed to calculate for the following reasons:   The 2019 ASCVD risk score is only valid for ages 17 to 70   Risk score cannot be calculated because patient has a medical history suggesting prior/existing ASCVD    Assessment & Plan:   There are no diagnoses linked to this encounter.   No follow-ups on file.    Wellington Half, FNP

## 2023-06-28 NOTE — Progress Notes (Unsigned)
   Established Patient Office Visit  Subjective   Patient ID: Stephen Meza A. Feuerborn, male    DOB: [birth year]  Age: 89 y.o. MRN: [id number]  No chief complaint on file.   HPI Patient presents today for medication management. Reports compliance with medication regimen. Denies the need for refills. Not fasting today. Denies other concerns. Medical history as outlined below.  ROS Per HPI    Objective:     There were no vitals taken for this visit.  Physical Exam Vitals and nursing note reviewed.  Constitutional:      General: He is not in acute distress.    Appearance: Normal appearance.     Comments: Appears fatigued, but overall improved from last visit  HENT:     Head: Normocephalic and atraumatic.     Right Ear: External ear normal.     Left Ear: External ear normal.     Nose: Nose normal.     Mouth/Throat:     Mouth: Mucous membranes are moist.     Pharynx: Oropharynx is clear.  Eyes:     Extraocular Movements: Extraocular movements intact.  Cardiovascular:     Rate and Rhythm: Normal rate and regular rhythm.     Pulses: Normal pulses.     Heart sounds: Normal heart sounds.  Pulmonary:     Effort: Pulmonary effort is normal. No respiratory distress.     Breath sounds: Normal breath sounds. No wheezing, rhonchi or rales.  Musculoskeletal:     Cervical back: Normal range of motion.     Right lower leg: No edema.     Left lower leg: No edema.     Comments: Generalized muscle weakness   Lymphadenopathy:     Cervical: No cervical adenopathy.  Skin:    General: Skin is warm and dry.  Neurological:     General: No focal deficit present.     Mental Status: He is alert and oriented to person, place, and time.     Sensory: Sensory deficit present.     Motor: Weakness present.     Coordination: Coordination abnormal.     Gait: Gait abnormal.     Comments: Using walker today, moving slowly. Unsteady gait.   Psychiatric:        Mood and Affect: Mood normal.         Behavior: Behavior normal.    No results found for any visits on 06/29/23.   The ASCVD Risk score (Arnett DK, et al., 2019) failed to calculate for the following reasons:   The 2019 ASCVD risk score is only valid for ages 70 to 42   Risk score cannot be calculated because patient has a medical history suggesting prior/existing ASCVD    Assessment & Plan:   There are no diagnoses linked to this encounter.   No follow-ups on file.    Wellington Half, FNP

## 2023-06-29 ENCOUNTER — Ambulatory Visit (INDEPENDENT_AMBULATORY_CARE_PROVIDER_SITE_OTHER): Admitting: Family Medicine

## 2023-06-29 ENCOUNTER — Encounter: Payer: Self-pay | Admitting: Family Medicine

## 2023-06-29 ENCOUNTER — Other Ambulatory Visit: Payer: Self-pay

## 2023-06-29 VITALS — BP 120/82 | HR 104 | Temp 98.0°F | Ht 70.0 in | Wt 156.0 lb

## 2023-06-29 DIAGNOSIS — Z79899 Other long term (current) drug therapy: Secondary | ICD-10-CM | POA: Diagnosis not present

## 2023-06-29 DIAGNOSIS — M170 Bilateral primary osteoarthritis of knee: Secondary | ICD-10-CM | POA: Diagnosis not present

## 2023-06-29 DIAGNOSIS — E538 Deficiency of other specified B group vitamins: Secondary | ICD-10-CM

## 2023-06-29 DIAGNOSIS — R918 Other nonspecific abnormal finding of lung field: Secondary | ICD-10-CM

## 2023-06-29 DIAGNOSIS — E782 Mixed hyperlipidemia: Secondary | ICD-10-CM | POA: Diagnosis not present

## 2023-06-29 DIAGNOSIS — R296 Repeated falls: Secondary | ICD-10-CM | POA: Diagnosis not present

## 2023-06-29 DIAGNOSIS — R7303 Prediabetes: Secondary | ICD-10-CM | POA: Insufficient documentation

## 2023-06-29 DIAGNOSIS — Z8673 Personal history of transient ischemic attack (TIA), and cerebral infarction without residual deficits: Secondary | ICD-10-CM

## 2023-06-29 DIAGNOSIS — I6523 Occlusion and stenosis of bilateral carotid arteries: Secondary | ICD-10-CM

## 2023-06-29 DIAGNOSIS — G621 Alcoholic polyneuropathy: Secondary | ICD-10-CM

## 2023-06-29 DIAGNOSIS — N401 Enlarged prostate with lower urinary tract symptoms: Secondary | ICD-10-CM

## 2023-06-29 DIAGNOSIS — I499 Cardiac arrhythmia, unspecified: Secondary | ICD-10-CM

## 2023-06-29 DIAGNOSIS — R531 Weakness: Secondary | ICD-10-CM

## 2023-06-29 MED ORDER — AMLODIPINE BESYLATE 2.5 MG PO TABS: 2.5000 mg | ORAL_TABLET | Freq: Every day | ORAL | 3 refills | Status: DC

## 2023-06-29 MED ORDER — CARVEDILOL 3.125 MG PO TABS: 3.1250 mg | ORAL_TABLET | Freq: Two times a day (BID) | ORAL | 3 refills | Status: DC

## 2023-06-29 NOTE — Assessment & Plan Note (Signed)
 Continue PT and OT

## 2023-06-29 NOTE — Assessment & Plan Note (Signed)
 Continue Voltaren  use with benefit, improved

## 2023-06-29 NOTE — Assessment & Plan Note (Signed)
 A1c ordered.

## 2023-06-29 NOTE — Assessment & Plan Note (Signed)
Start carvedilol 3.125 mg twice daily

## 2023-06-29 NOTE — Assessment & Plan Note (Signed)
 B12 ordered.

## 2023-06-29 NOTE — Assessment & Plan Note (Signed)
 Wheezing today, has follow-up with pulmonology on 07/09/2023

## 2023-06-29 NOTE — Assessment & Plan Note (Signed)
 Stable, B12 ordered

## 2023-06-29 NOTE — Assessment & Plan Note (Signed)
 Labs ordered, will dose adjust as needed

## 2023-06-29 NOTE — Patient Instructions (Addendum)
 STOP amlodipine   START carvedilol 3.125 one tablet twice a day.   Continue with therapies as ordered  Follow up with me in about a month with blood pressure and to recheck heart rate, sooner if needed  Continue with protein shakes daily.

## 2023-06-29 NOTE — Assessment & Plan Note (Signed)
 Stable, continue Plavix , Zetia  Myopathies improved with Zetia  versus rosuvastatin 

## 2023-06-30 ENCOUNTER — Encounter: Payer: Self-pay | Admitting: Family Medicine

## 2023-06-30 ENCOUNTER — Encounter (HOSPITAL_COMMUNITY): Payer: Self-pay

## 2023-07-01 ENCOUNTER — Other Ambulatory Visit: Payer: Self-pay | Admitting: Family Medicine

## 2023-07-01 DIAGNOSIS — E782 Mixed hyperlipidemia: Secondary | ICD-10-CM

## 2023-07-01 DIAGNOSIS — I10 Essential (primary) hypertension: Secondary | ICD-10-CM | POA: Diagnosis not present

## 2023-07-01 DIAGNOSIS — M109 Gout, unspecified: Secondary | ICD-10-CM | POA: Diagnosis not present

## 2023-07-01 DIAGNOSIS — I69354 Hemiplegia and hemiparesis following cerebral infarction affecting left non-dominant side: Secondary | ICD-10-CM | POA: Diagnosis not present

## 2023-07-01 DIAGNOSIS — I499 Cardiac arrhythmia, unspecified: Secondary | ICD-10-CM | POA: Diagnosis not present

## 2023-07-01 DIAGNOSIS — M1990 Unspecified osteoarthritis, unspecified site: Secondary | ICD-10-CM | POA: Diagnosis not present

## 2023-07-01 DIAGNOSIS — G720 Drug-induced myopathy: Secondary | ICD-10-CM

## 2023-07-01 DIAGNOSIS — D519 Vitamin B12 deficiency anemia, unspecified: Secondary | ICD-10-CM | POA: Diagnosis not present

## 2023-07-01 DIAGNOSIS — M4802 Spinal stenosis, cervical region: Secondary | ICD-10-CM | POA: Diagnosis not present

## 2023-07-01 DIAGNOSIS — Z48812 Encounter for surgical aftercare following surgery on the circulatory system: Secondary | ICD-10-CM | POA: Diagnosis not present

## 2023-07-01 DIAGNOSIS — Z8551 Personal history of malignant neoplasm of bladder: Secondary | ICD-10-CM | POA: Diagnosis not present

## 2023-07-01 DIAGNOSIS — Z7902 Long term (current) use of antithrombotics/antiplatelets: Secondary | ICD-10-CM | POA: Diagnosis not present

## 2023-07-01 DIAGNOSIS — R911 Solitary pulmonary nodule: Secondary | ICD-10-CM | POA: Diagnosis not present

## 2023-07-06 ENCOUNTER — Ambulatory Visit

## 2023-07-06 DIAGNOSIS — G720 Drug-induced myopathy: Secondary | ICD-10-CM | POA: Diagnosis not present

## 2023-07-06 DIAGNOSIS — Z48812 Encounter for surgical aftercare following surgery on the circulatory system: Secondary | ICD-10-CM | POA: Diagnosis not present

## 2023-07-06 DIAGNOSIS — M109 Gout, unspecified: Secondary | ICD-10-CM | POA: Diagnosis not present

## 2023-07-06 DIAGNOSIS — Z8551 Personal history of malignant neoplasm of bladder: Secondary | ICD-10-CM | POA: Diagnosis not present

## 2023-07-06 DIAGNOSIS — I69354 Hemiplegia and hemiparesis following cerebral infarction affecting left non-dominant side: Secondary | ICD-10-CM | POA: Diagnosis not present

## 2023-07-06 DIAGNOSIS — R911 Solitary pulmonary nodule: Secondary | ICD-10-CM | POA: Diagnosis not present

## 2023-07-06 DIAGNOSIS — Z7902 Long term (current) use of antithrombotics/antiplatelets: Secondary | ICD-10-CM | POA: Diagnosis not present

## 2023-07-06 DIAGNOSIS — M1990 Unspecified osteoarthritis, unspecified site: Secondary | ICD-10-CM | POA: Diagnosis not present

## 2023-07-06 DIAGNOSIS — I10 Essential (primary) hypertension: Secondary | ICD-10-CM | POA: Diagnosis not present

## 2023-07-06 DIAGNOSIS — I499 Cardiac arrhythmia, unspecified: Secondary | ICD-10-CM | POA: Diagnosis not present

## 2023-07-06 DIAGNOSIS — M4802 Spinal stenosis, cervical region: Secondary | ICD-10-CM | POA: Diagnosis not present

## 2023-07-06 DIAGNOSIS — D519 Vitamin B12 deficiency anemia, unspecified: Secondary | ICD-10-CM | POA: Diagnosis not present

## 2023-07-06 NOTE — Progress Notes (Unsigned)
 Office Note    HPI: Stephen Meza is a 89 y.o. (1933/12/31) male presenting in follow-up status post 05/14/2023 right TCAR for symptomatic ICA stenosis.  The pt is *** on a statin for cholesterol management.  The pt is *** on a daily aspirin .   Other AC:  *** The pt is *** on medication for hypertension.   The pt is *** diabetic.  Tobacco hx:  ***  Past Medical History:  Diagnosis Date   DUODENITIS, WITHOUT HEMORRHAGE 07/14/2007   ERECTILE DYSFUNCTION 07/14/2007   Esophageal stricture 03/19/2016   ESOPHAGITIS 07/14/2007   EXTERNAL HEMORRHOIDS 07/14/2007   GERD 07/14/2007   Gout    HYPERTENSION 07/14/2007   PERIPHERAL NEUROPATHY 07/14/2007   Peripheral neuropathy 04/06/2019   PERIPHERAL VASCULAR DISEASE 07/14/2007   PODAGRA 07/14/2007   Prostate cancer (HCC) 02/2012   pt had screening and then biopsy   Retinal tear of right eye 03/19/2016   TOBACCO ABUSE, HX OF 02/12/2010   TONSILLECTOMY, HX OF 07/14/2007   TREMOR, ESSENTIAL 07/14/2007   VITAMIN B12 DEFICIENCY 07/14/2007    Past Surgical History:  Procedure Laterality Date   Excision of lipoma     PROSTATE BIOPSY Bilateral 03/10/2012   surgery to reduce turbinate and straighten deviated septum  2016   TONSILLECTOMY     TRANSCAROTID ARTERY REVASCULARIZATION  Right 05/14/2023   Procedure: TRANSCAROTID ARTERY REVASCULARIZATION (TCAR) UISNG 10mm X 40mm ENROUTE TRANSCAROTID STENT SYSTEM;  Surgeon: Kayla Part, MD;  Location: [facility] OR;  Service: Vascular;  Laterality: Right;    Social History   Socioeconomic History   Marital status: Married    Spouse name: Not on file   Number of children: 2   Years of education: 12   Highest education level: Not on file  Occupational History   Occupation: retired    Associate Professor: RETIRED  Tobacco Use   Smoking status: Former    Current packs/day: 0.75    Average packs/day: 0.8 packs/day for 29.4 years (22.0 ttl pk-yrs)    Types: Cigarettes    Start date: 02/23/1994    Smokeless tobacco: Former  Building services engineer status: Never Used  Substance and Sexual Activity   Alcohol use: Not Currently    Comment: d/c beer about 5 months ago   Drug use: No   Sexual activity: Yes    Partners: Female  Other Topics Concern   Not on file  Social History Narrative   HSG. Married '1958. 2 dtrs; 4 g-children. Work - retired. 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       Not disabled      Retired since 1986      Right handed      Two story home   Social Drivers of Health   Financial Resource Strain: Low Risk  (08/04/2022)   Overall Financial Resource Strain (CARDIA)    Difficulty of Paying Living Expenses: Not hard at all  Food Insecurity: No Food Insecurity (05/10/2023)   Hunger Vital Sign    Worried About Running Out of Food in the Last Year: Never true    Ran Out of Food in the Last Year: Never true  Transportation Needs: No Transportation Needs (05/10/2023)   PRAPARE - Administrator, Civil Service (Medical): No    Lack of Transportation (Non-Medical): No  Physical Activity: Sufficiently Active (08/04/2022)   Exercise Vital Sign    Days of Exercise per  Week: 5 days    Minutes of Exercise per Session: 30 min  Stress: No Stress Concern Present (08/04/2022)   [employer] of Occupational Health - Occupational Stress Questionnaire    Feeling of Stress : Not at all  Social Connections: Socially Integrated (05/10/2023)   Social Connection and Isolation Panel [NHANES]    Frequency of Communication with Friends and Family: More than three times a week    Frequency of Social Gatherings with Friends and Family: Twice a week    Attends Religious Services: More than 4 times per year    Active Member of [employer] or Organizations: Yes    Attends Engineer, structural: More than 4 times per year    Marital Status: Married  Catering manager Violence: Not At Risk (05/10/2023)    Humiliation, Afraid, Rape, and Kick questionnaire    Fear of Current or Ex-Partner: No    Emotionally Abused: No    Physically Abused: No    Sexually Abused: No   *** Family History  Problem Relation Age of Onset   Hypothyroidism Daughter    Breast cancer Other    Cancer Mother    Other Father    Breast cancer Sister     Current Outpatient Medications  Medication Sig Dispense Refill   allopurinol  (ZYLOPRIM ) 100 MG tablet TAKE 1 TABLET BY MOUTH EVERY DAY 90 tablet 4   aspirin  EC 81 MG tablet Take 81 mg by mouth daily. Swallow whole.     benzonatate  (TESSALON  PERLES) 100 MG capsule Take 1 capsule (100 mg total) by mouth every 6 (six) hours as needed for cough. 30 capsule 0   carvedilol  (COREG ) 3.125 MG tablet Take 1 tablet (3.125 mg total) by mouth 2 (two) times daily with a meal. 60 tablet 3   clopidogrel  (PLAVIX ) 75 MG tablet Take 1 tablet (75 mg total) by mouth daily. 30 tablet 2   Cyanocobalamin  (B-12) 3000 MCG CAPS Take 3,000 mcg by mouth daily.     diclofenac  Sodium (VOLTAREN ) 1 % GEL Apply 4 g topically 4 (four) times daily. 150 g 3   ezetimibe  (ZETIA ) 10 MG tablet TAKE 1 TABLET BY MOUTH EVERY DAY 90 tablet 1   omeprazole  (PRILOSEC) 40 MG capsule TAKE 1 CAPSULE BY MOUTH EVERY DAY 90 capsule 2   polyethylene glycol (MIRALAX ) 17 g packet Take 17 g by mouth daily as needed for moderate constipation or severe constipation (not helped with stool softner). 10 each 0   No current facility-administered medications for this visit.    Allergies  Allergen Reactions   Nitrofuran Derivatives Rash   Ciprofloxacin Hcl Other (See Comments)    numbness   Statins     Intolerance, joint and muscle pain     REVIEW OF SYSTEMS:  *** [X]  denotes positive finding, [ ]  denotes negative finding Cardiac  Comments:  Chest pain or chest pressure:    Shortness of breath upon exertion:    Short of breath when lying flat:    Irregular heart rhythm:        Vascular    Pain in calf, thigh, or  hip brought on by ambulation:    Pain in feet at night that wakes you up from your sleep:     Blood clot in your veins:    Leg swelling:         Pulmonary    Oxygen at home:    Productive cough:     Wheezing:  Neurologic    Sudden weakness in arms or legs:     Sudden numbness in arms or legs:     Sudden onset of difficulty speaking or slurred speech:    Temporary loss of vision in one eye:     Problems with dizziness:         Gastrointestinal    Blood in stool:     Vomited blood:         Genitourinary    Burning when urinating:     Blood in urine:        Psychiatric    Major depression:         Hematologic    Bleeding problems:    Problems with blood clotting too easily:        Skin    Rashes or ulcers:        Constitutional    Fever or chills:      PHYSICAL EXAMINATION:  There were no vitals filed for this visit.  General:  WDWN in NAD; vital signs documented above Gait: Not observed HENT: WNL, normocephalic Pulmonary: normal non-labored breathing , without wheezing Cardiac: {Desc; regular/irreg:14544} HR Abdomen: soft, NT, no masses Skin: {With/Without:20273} rashes Vascular Exam/Pulses:  Right Left  Radial {Exam; arterial pulse strength 0-4:30167} {Exam; arterial pulse strength 0-4:30167}  Ulnar {Exam; arterial pulse strength 0-4:30167} {Exam; arterial pulse strength 0-4:30167}  Femoral {Exam; arterial pulse strength 0-4:30167} {Exam; arterial pulse strength 0-4:30167}  Popliteal {Exam; arterial pulse strength 0-4:30167} {Exam; arterial pulse strength 0-4:30167}  DP {Exam; arterial pulse strength 0-4:30167} {Exam; arterial pulse strength 0-4:30167}  PT {Exam; arterial pulse strength 0-4:30167} {Exam; arterial pulse strength 0-4:30167}   Extremities: {With/Without:20273} ischemic changes, {With/Without:20273} Gangrene , {With/Without:20273} cellulitis; {With/Without:20273} open wounds;  Musculoskeletal: no muscle wasting or atrophy  Neurologic:  A&O X 3;  No focal weakness or paresthesias are detected Psychiatric:  The pt has {Desc; normal/abnormal:11317::"Normal"} affect.   Non-Invasive Vascular Imaging:   ***    ASSESSMENT/PLAN: Stephen Meza is a 89 y.o. male presenting with ***   ***   Kayla Part, MD [facility] [phone]

## 2023-07-07 ENCOUNTER — Ambulatory Visit (INDEPENDENT_AMBULATORY_CARE_PROVIDER_SITE_OTHER)

## 2023-07-07 ENCOUNTER — Telehealth: Payer: Self-pay | Admitting: Family Medicine

## 2023-07-07 VITALS — Ht 70.0 in | Wt 156.0 lb

## 2023-07-07 DIAGNOSIS — Z0000 Encounter for general adult medical examination without abnormal findings: Secondary | ICD-10-CM | POA: Diagnosis not present

## 2023-07-07 NOTE — Progress Notes (Signed)
 Subjective:   Stephen Meza is a 89 y.o. who presents for a Medicare Wellness preventive visit.  As a reminder, Annual Wellness Visits don't include a physical exam, and some assessments may be limited, especially if this visit is performed virtually. We may recommend an in-person visit if needed.  Visit Complete: Virtual I connected with  Stephen Meza on 07/07/23 by a audio enabled telemedicine application and verified that I am speaking with the correct person using two identifiers.  Patient Location: Home  Provider Location: Home Office  I discussed the limitations of evaluation and management by telemedicine. The patient expressed understanding and agreed to proceed.  Vital Signs: Because this visit was a virtual/telehealth visit, some criteria may be missing or patient reported. Any vitals not documented were not able to be obtained and vitals that have been documented are patient reported.  VideoDeclined- This patient declined Librarian, academic. Therefore the visit was completed with audio only.  Persons Participating in Visit: Patient.  AWV Questionnaire: No: Patient Medicare AWV questionnaire was not completed prior to this visit.  Cardiac Risk Factors include: advanced age (>46men, >34 women);hypertension;male gender     Objective:     Today's Vitals   07/07/23 1351  Weight: 156 lb (70.8 kg)  Height: 5\' 10"  (1.778 m)   Body mass index is 22.38 kg/m.     07/07/2023    2:02 PM 05/10/2023    1:40 PM 05/09/2023    4:49 PM 08/04/2022    2:26 PM 08/13/2021    2:09 PM 08/07/2020    1:27 PM 06/21/2019    3:57 PM  Advanced Directives  Does Patient Have a Medical Advance Directive? Yes Yes Yes Yes Yes Yes Yes  Type of Estate agent of [name];Living will Healthcare Power of [name];Living will Healthcare Power of [place];Living will Healthcare Power of [name];Living will Living will;Healthcare Power of  Attorney Living will;Healthcare Power of [name] Power of [name];Living will  Does patient want to make changes to medical advance directive?  Yes (Inpatient - patient defers changing a medical advance directive and declines information at this time)   No - Patient declined No - Patient declined No - Patient declined  Copy of Healthcare Power of Attorney in Chart? No - copy requested No - copy requested  No - copy requested No - copy requested No - copy requested No - copy requested    Current Medications (verified) Outpatient Encounter Medications as of 07/07/2023  Medication Sig   allopurinol  (ZYLOPRIM ) 100 MG tablet TAKE 1 TABLET BY MOUTH EVERY DAY   aspirin  EC 81 MG tablet Take 81 mg by mouth daily. Swallow whole.   benzonatate  (TESSALON  PERLES) 100 MG capsule Take 1 capsule (100 mg total) by mouth every 6 (six) hours as needed for cough.   carvedilol  (COREG ) 3.125 MG tablet Take 1 tablet (3.125 mg total) by mouth 2 (two) times daily with a meal.   clopidogrel  (PLAVIX ) 75 MG tablet Take 1 tablet (75 mg total) by mouth daily.   Cyanocobalamin  (B-12) 3000 MCG CAPS Take 3,000 mcg by mouth daily.   diclofenac  Sodium (VOLTAREN ) 1 % GEL Apply 4 g topically 4 (four) times daily.   ezetimibe  (ZETIA ) 10 MG tablet TAKE 1 TABLET BY MOUTH EVERY DAY   omeprazole  (PRILOSEC) 40 MG capsule TAKE 1 CAPSULE BY MOUTH EVERY DAY   polyethylene glycol (MIRALAX ) 17 g packet Take 17 g by mouth daily as needed for moderate constipation or severe constipation (  not helped with stool softner).   No facility-administered encounter medications on file as of 07/07/2023.    Allergies (verified) Nitrofuran derivatives, Ciprofloxacin hcl, and Statins   History: Past Medical History:  Diagnosis Date   DUODENITIS, WITHOUT HEMORRHAGE 07/14/2007   ERECTILE DYSFUNCTION 07/14/2007   Esophageal stricture 03/19/2016   ESOPHAGITIS 07/14/2007   EXTERNAL HEMORRHOIDS 07/14/2007   GERD 07/14/2007   Gout     HYPERTENSION 07/14/2007   PERIPHERAL NEUROPATHY 07/14/2007   Peripheral neuropathy 04/06/2019   PERIPHERAL VASCULAR DISEASE 07/14/2007   PODAGRA 07/14/2007   Prostate cancer (HCC) 02/2012   pt had screening and then biopsy   Retinal tear of right eye 03/19/2016   TOBACCO ABUSE, HX OF 02/12/2010   TONSILLECTOMY, HX OF 07/14/2007   TREMOR, ESSENTIAL 07/14/2007   VITAMIN B12 DEFICIENCY 07/14/2007   Past Surgical History:  Procedure Laterality Date   Excision of lipoma     PROSTATE BIOPSY Bilateral 03/10/2012   surgery to reduce turbinate and straighten deviated septum  2016   TONSILLECTOMY     TRANSCAROTID ARTERY REVASCULARIZATION  Right 05/14/2023   Procedure: TRANSCAROTID ARTERY REVASCULARIZATION (TCAR) UISNG 10mm X 40mm ENROUTE TRANSCAROTID STENT SYSTEM;  Surgeon: Kayla Part, MD;  Location: [facility] OR;  Service: Vascular;  Laterality: Right;   Family History  Problem Relation Age of Onset   Hypothyroidism Daughter    Breast cancer Other    Cancer Mother    Other Father    Breast cancer Sister    Social History   Socioeconomic History   Marital status: Married    Spouse name: Not on file   Number of children: 2   Years of education: 12   Highest education level: Not on file  Occupational History   Occupation: retired    Associate Professor: RETIRED  Tobacco Use   Smoking status: Former    Current packs/day: 0.75    Average packs/day: 0.8 packs/day for 29.4 years (22.0 ttl pk-yrs)    Types: Cigarettes    Start date: 02/23/1994   Smokeless tobacco: Former  Building services engineer status: Never Used  Substance and Sexual Activity   Alcohol use: Not Currently    Comment: d/c beer about 5 months ago   Drug use: No   Sexual activity: Yes    Partners: Female  Other Topics Concern   Not on file  Social History Narrative   HSG. Married '1958. 2 dtrs; 4 g-children. Work - retired. 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        Not disabled      Retired since 1986      Right handed      Two story home   Social Drivers of Health   Financial Resource Strain: Low Risk  (07/07/2023)   Overall Financial Resource Strain (CARDIA)    Difficulty of Paying Living Expenses: Not very hard  Food Insecurity: No Food Insecurity (07/07/2023)   Hunger Vital Sign    Worried About Running Out of Food in the Last Year: Never true    Ran Out of Food in the Last Year: Never true  Transportation Needs: No Transportation Needs (05/10/2023)   PRAPARE - Administrator, Civil Service (Medical): No    Lack of Transportation (Non-Medical): No  Physical Activity: Sufficiently Active (07/07/2023)   Exercise Vital Sign    Days of Exercise per Week: 5 days    Minutes of Exercise  per Session: 40 min  Stress: No Stress Concern Present (07/07/2023)   [employer] of Occupational Health - Occupational Stress Questionnaire    Feeling of Stress : Not at all  Social Connections: Moderately Integrated (07/07/2023)   Social Connection and Isolation Panel [NHANES]    Frequency of Communication with Friends and Family: Once a week    Frequency of Social Gatherings with Friends and Family: Once a week    Attends Religious Services: More than 4 times per year    Active Member of [employer] or Organizations: Yes    Attends Banker Meetings: Never    Marital Status: Married    Tobacco Counseling Counseling given: Not Answered    Clinical Intake:  Pre-visit preparation completed: Yes  Pain : No/denies pain     BMI - recorded: 22.38 Nutritional Status: BMI of 19-24  Normal Nutritional Risks: None Diabetes: No  Lab Results  Component Value Date   HGBA1C 5.5 05/10/2023   HGBA1C 6.0 11/06/2022   HGBA1C 5.8 05/08/2022     How often do you need to have someone help you when you read instructions, pamphlets, or other written materials from your doctor or pharmacy?: 1 - Never  Interpreter Needed?:  No  Information entered by :: Messiyah Waterson, RMA   Activities of Daily Living     07/07/2023    1:52 PM 05/10/2023    1:40 PM  In your present state of health, do you have any difficulty performing the following activities:  Hearing? 0 1  Vision? 0 0  Difficulty concentrating or making decisions? 0 0  Walking or climbing stairs? 0   Dressing or bathing? 0   Doing errands, shopping? 0 1  Comment wife does most of the driving   Preparing Food and eating ? N   Using the Toilet? N   In the past six months, have you accidently leaked urine? N   Do you have problems with loss of bowel control? N   Managing your Medications? N   Managing your Finances? N   Housekeeping or managing your Housekeeping? N     Patient Care Team: Wellington Half, FNP as PCP - General (Family Medicine) Murtis Arthur, OD as Consulting Physician (Optometry) Ronni Colace, MD as Consulting Physician (Ophthalmology) Samson Croak, MD as Consulting Physician (Urology)  Indicate any recent Medical Services you may have received from other than [facility] providers in the past year (date may be approximate).     Assessment:    This is a routine wellness examination for Stephen Meza.  Hearing/Vision screen Hearing Screening - Comments:: Denies hearing difficulties     Goals Addressed             This Visit's Progress    My goal for 2024 is to stay alive and maintain my health.   On track      Depression Screen     07/07/2023    2:23 PM 03/19/2023    3:00 PM 11/10/2022    1:14 PM 08/04/2022    2:25 PM 05/12/2022    1:13 PM 11/07/2021    3:29 PM 08/13/2021    2:08 PM  PHQ 2/9 Scores  PHQ - 2 Score 0 0 0 0 0 0 0  PHQ- 9 Score 0   0 0      Fall Risk     07/07/2023    2:21 PM 06/04/2023   10:04 AM 04/26/2023    1:52 PM 04/12/2023  4:18 PM 03/19/2023    3:00 PM  Fall Risk   Falls in the past year? 0 0 0 0 0  Number falls in past yr: 0    0  Injury with Fall? 0    0  Risk for fall due to :      No Fall Risks  Follow up Falls prevention discussed;Falls evaluation completed    Falls evaluation completed    MEDICARE RISK AT HOME:  Medicare Risk at Home Any stairs in or around the home?: Yes If so, are there any without handrails?: Yes Home free of loose throw rugs in walkways, pet beds, electrical cords, etc?: Yes Adequate lighting in your home to reduce risk of falls?: Yes Life alert?: No Use of a cane, walker or w/c?: No Grab bars in the bathroom?: Yes Shower chair or bench in shower?: Yes Elevated toilet seat or a handicapped toilet?: Yes  TIMED UP AND GO:  Was the test performed?  No  Cognitive Function: 6CIT completed      08/01/2018   11:00 AM  Montreal Cognitive Assessment   Visuospatial/ Executive (0/5) 3  Naming (0/3) 2  Attention: Read list of digits (0/2) 2  Attention: Read list of letters (0/1) 1  Attention: Serial 7 subtraction starting at 100 (0/3) 3  Language: Repeat phrase (0/2) 1  Language : Fluency (0/1) 0  Abstraction (0/2) 0  Delayed Recall (0/5) 0  Orientation (0/6) 5  Total 17  Adjusted Score (based on education) 18      08/04/2022    2:28 PM 08/13/2021    2:10 PM  6CIT Screen  What Year? 0 points 0 points  What month? 0 points 0 points  What time? 0 points 0 points  Count back from 20 0 points 0 points  Months in reverse 0 points 0 points  Repeat phrase 0 points 0 points  Total Score 0 points 0 points    Immunizations Immunization History  Administered Date(s) Administered   Fluad Quad(high Dose 65+) 10/27/2018, 11/27/2019, 11/01/2020, 11/07/2021   Fluad Trivalent(High Dose 65+) 11/10/2022   Influenza Whole 12/23/2006, 12/17/2008, 11/27/2011   Influenza, High Dose Seasonal PF 12/16/2017, 10/31/2018   Influenza-Unspecified 12/08/2014, 11/23/2016   PFIZER(Purple Top)SARS-COV-2 Vaccination 03/11/2019, 04/01/2019, 12/08/2019, 09/25/2020   Pneumococcal Conjugate-13 05/01/2013   Pneumococcal Polysaccharide-23 12/23/2006   Respiratory  Syncytial Virus Vaccine,Recomb Aduvanted(Arexvy) 11/19/2021   Td 10/23/2008   Tdap 04/06/2019   Zoster Recombinant(Shingrix) 08/03/2017, 10/09/2017   Zoster, Live 12/23/2006    Screening Tests Health Maintenance  Topic Date Due   COVID-19 Vaccine (5 - 2024-25 season) 10/25/2022   INFLUENZA VACCINE  09/24/2023   Medicare Annual Wellness (AWV)  07/06/2024   DTaP/Tdap/Td (3 - Td or Tdap) 04/05/2029   Pneumonia Vaccine 27+ Years old  Completed   Zoster Vaccines- Shingrix  Completed   HPV VACCINES  Aged Out   Meningococcal B Vaccine  Aged Out    Health Maintenance  Health Maintenance Due  Topic Date Due   COVID-19 Vaccine (5 - 2024-25 season) 10/25/2022   Health Maintenance Items Addressed: See Nurse Notes  Additional Screening:  Vision Screening: Recommended annual ophthalmology exams for early detection of glaucoma and other disorders of the eye.  Dental Screening: Recommended annual dental exams for proper oral hygiene  Community Resource Referral / Chronic Care Management: CRR required this visit?  No   CCM required this visit?  No   Plan:    I have personally reviewed and noted the following in  the patient's chart:   Medical and social history Use of alcohol, tobacco or illicit drugs  Current medications and supplements including opioid prescriptions. Patient is not currently taking opioid prescriptions. Functional ability and status Nutritional status Physical activity Advanced directives List of other physicians Hospitalizations, surgeries, and ER visits in previous 12 months Vitals Screenings to include cognitive, depression, and falls Referrals and appointments  In addition, I have reviewed and discussed with patient certain preventive protocols, quality metrics, and best practice recommendations. A written personalized care plan for preventive services as well as general preventive health recommendations were provided to patient.   Sevilla Murtagh L [place] Pizzimenti,  CMA   07/07/2023   After Visit Summary: (MyChart) Due to this being a telephonic visit, the after visit summary with patients personalized plan was offered to patient via MyChart   Notes: Nothing significant to report at this time.

## 2023-07-07 NOTE — Telephone Encounter (Signed)
 Copied from CRM [id number]. Topic: General - Other >> Jul 07, 2023  1:33 PM Albertha Alosa wrote: Reason for CRM: Patient wife, Cary Clarks called in regarding the message she left in [place] regarding her and the patient, would like for [facility] nurse to give her a callback regarding this

## 2023-07-07 NOTE — Telephone Encounter (Signed)
 Copied from CRM [id number]. Topic: Clinical - Home Health Verbal Orders >> Jul 07, 2023 10:27 AM Jenice Mitts wrote: Caller/Agency: [facility]aren [facility] Number: [phone] / voicemail box is secured and if a missed call occurs , please leave verbal orders in voicemail. Service Requested: skilled nursing Frequency: evaluation  Any new concerns about the patient? Yes Increased leg weakness no falls

## 2023-07-07 NOTE — Telephone Encounter (Signed)
 Left detailed voicemail for [place].  Stephanie, verbals given. Just an FYI

## 2023-07-07 NOTE — Patient Instructions (Signed)
 Stephen Meza , Thank you for taking time out of your busy schedule to complete your Annual Wellness Visit with me. I enjoyed our conversation and look forward to speaking with you again next year. I, as well as your care team,  appreciate your ongoing commitment to your health goals. Please review the following plan we discussed and let me know if I can assist you in the future. Your Game plan/ To Do List     Follow up Visits: Next Medicare AWV with our clinical staff: 07/10/2024.   Have you seen your provider in the last 6 months (3 months if uncontrolled diabetes)? Yes Next Office Visit with your provider: 09/29/2023.  Clinician Recommendations:  Aim for 30 minutes of exercise or brisk walking, 6-8 glasses of water, and 5 servings of fruits and vegetables each day. Keep up the good work.      This is a list of the screening recommended for you and due dates:  Health Maintenance  Topic Date Due   COVID-19 Vaccine (5 - 2024-25 season) 10/25/2022   Medicare Annual Wellness Visit  08/04/2023   Flu Shot  09/24/2023   DTaP/Tdap/Td vaccine (3 - Td or Tdap) 04/05/2029   Pneumonia Vaccine  Completed   Zoster (Shingles) Vaccine  Completed   HPV Vaccine  Aged Out   Meningitis B Vaccine  Aged Out    Advanced directives: (Copy Requested) Please bring a copy of your health care power of attorney and living will to the office to be added to your chart at your convenience. You can mail to [facility] [address] [place], [place] [address] or email to [email or web address] Advance Care Planning is important because it:  [x]  Makes sure you receive the medical care that is consistent with your values, goals, and preferences  [x]  It provides guidance to your family and loved ones and reduces their decisional burden about whether or not they are making the right decisions based on your wishes.  Follow the link provided in your after visit summary or read over the paperwork we  have mailed to you to help you started getting your Advance Directives in place. If you need assistance in completing these, please reach out to us  so that we can help you!  See attachments for Preventive Care and Fall Prevention Tips.

## 2023-07-08 ENCOUNTER — Ambulatory Visit (HOSPITAL_COMMUNITY)
Admission: RE | Admit: 2023-07-08 | Discharge: 2023-07-08 | Disposition: A | Discharge status: Home / Self Care | Source: Ambulatory Visit | Attending: Vascular Surgery | Admitting: Vascular Surgery

## 2023-07-08 ENCOUNTER — Ambulatory Visit: Attending: Vascular Surgery | Admitting: Vascular Surgery

## 2023-07-08 VITALS — BP 148/78 | HR 90 | Temp 98.4°F

## 2023-07-08 DIAGNOSIS — I6523 Occlusion and stenosis of bilateral carotid arteries: Secondary | ICD-10-CM

## 2023-07-08 DIAGNOSIS — Z9862 Peripheral vascular angioplasty status: Secondary | ICD-10-CM

## 2023-07-09 ENCOUNTER — Telehealth: Payer: Self-pay

## 2023-07-09 ENCOUNTER — Encounter: Payer: Self-pay | Admitting: Acute Care

## 2023-07-09 ENCOUNTER — Ambulatory Visit: Admitting: Acute Care

## 2023-07-09 VITALS — BP 145/79 | HR 84 | Ht 72.0 in | Wt 158.8 lb

## 2023-07-09 DIAGNOSIS — Z87891 Personal history of nicotine dependence: Secondary | ICD-10-CM

## 2023-07-09 DIAGNOSIS — R911 Solitary pulmonary nodule: Secondary | ICD-10-CM | POA: Diagnosis not present

## 2023-07-09 DIAGNOSIS — Z8673 Personal history of transient ischemic attack (TIA), and cerebral infarction without residual deficits: Secondary | ICD-10-CM | POA: Diagnosis not present

## 2023-07-09 DIAGNOSIS — Z8551 Personal history of malignant neoplasm of bladder: Secondary | ICD-10-CM

## 2023-07-09 DIAGNOSIS — R634 Abnormal weight loss: Secondary | ICD-10-CM

## 2023-07-09 NOTE — H&P (View-Only) (Signed)
 History of Present Illness Sherod A. Kuka is a 89 y.o. male former smoker with history of bladder cancer referred 03/2023 for a lung mass. He will be followed by Dr. Baldwin Levee.   Synopsis Patient previously seen by [facility] 04/26/2023 for evaluation of a lung nodule 04/12/2023, incidentally discovered when a CT Angio done 02/2023 to evaluate for pneumonia. He was treated with antibiotics for his pneumonia , pt stated the medication  resolved his symptoms.He denied  any further fever or purulent secretions.  He had follow up CT chest  03/26/2023, which  revealed a 6.3 by 5.2 cm right parahilar mass, so slightly smaller on follow up,  but the nodule persisted.  There is also suspicion of lymph node involvement.  PET scan was done 04/22/2023 to better evaluate the lung nodule.  It showed an intensely hypermetabolic right hilar mass that was consistent with a primary bronchogenic carcinoma.  The mass spanned from the right upper lobe to the right lower lobe constricting the right lower lobe bronchus.  There was also notation of hypermetabolic right lower paratracheal and subcarinal lymph nodes that is consistent with nodal metastasis.  There was no other evidence of distant metastatic disease.   Patient has had weight loss at the March 3 office visit.  He had reported losing approximately 12 pounds over the past year.  At that time he denied any hemoptysis.  He did confirm dyspnea with exertion.  Patient quit smoking approximately 30 years ago.   Patient was scheduled for bronchoscopy with biopsies to further evaluate the nodule on 05/10/2023.  However before the patient to get procedure done he presented to the emergency room on 05/09/2023 with symptoms of stroke.  MRI brain done 05/09/2023 showed multifocal acute infarcts within the right MCA and PCA territories including the right precentral gyrus.  Additionally there was attenuated right ICA flow void at the skull base likely related to stenosis.   There was abnormal right vertebral artery V2 segment flow void consistent with slow flow or occlusion.  In addition to a distal right vertebral artery flow void.  There is also notation of mild spinal canal stenosis and severe right neural foraminal stenosis at C4-C5.  There was moderate right and severe left C5-6 and C6-7 neural for aminal stenosis noted also.   Patient was in the hospital for a total of 6 days.  He had a right transcarotid artery revascularization-10x40 mm stent  for Symptomatic right internal carotid artery stenosis that is most likely the etiology of his stroke on May 14, 2023.  He was discharged home May 15, 2023.  Patient is currently on dual antiplatelet therapy.  Patient and family understand that we cannot move forward with bronchoscopy with biopsies until it is safe for him to come off his dual antiplatelet therapy. Patient states he has been weak since the procedure.  He is receiving in-home physical therapy that is helping him regain his strength.   Patient developed worsening shortness of breath with purulent secretions and cough since released from the hospital..  Patient has been brought into the office for evaluation of this significant finding after his stroke and recent revascularization procedure.   Of note patient has follow-up with vascular surgery on Jul 08, 2023.  At that time he will have an ultrasound of this digit vessel to make sure patency.  Patient will be on dual platelet therapy until this visit.  We will reach out to the vascular surgeon to see if he feels the patient  will be able to come off dual platelet therapy anytime soon for bronchoscopy with biopsies of the lung nodule.   07/09/2023 Pt. Presents for  follow up after treatment for a suspected pneumonia/ post admission COPD Flare.  He was treated with Augmentin  after the 06/04/2023 appointment . I also asked him to use his Incentive Spirometer, and Tessalon  Perles for cough. He states he feels much  better. He looks much better than the last time we saw him.   Pt. Saw Dr. Christia Cowboy, who did his right TCAR for symptomatic ICA stenosis on 3.21.2025.Carotid duplex ultrasound was reviewed demonstrating widely patent right sided stent. Plan is  to continue to follow with a 65-month ultrasound with no plan for further intervention at this time.  Dr. Christia Cowboy  asked that [name]  continue his current medication regimen which includes aspirin , Plavix , statin. He states that the patient can hold Plavix  for procedures moving forward, but would prefer he continue his ASA.   As Dr. Christia Cowboy feels the Plavix  can be held for 48 hours to move forward with the bronchoscopy with biopsies of the right hilar mass noted on CT imaging, I will move forward with getting the patient scheduled.  Patient states he has had more right sided chest pain and is concerned that the area has perhaps increased in size.  I have ordered a super D CT to better evaluate the nodule and also to allow for navigational planning, as the last CT scan done was in January 2025.  Again I reviewed the risks of bleeding, infection, pneumothorax, and adverse reaction to anesthesia.  The patient and family verbalized understanding of these risks and want to move forward with bronchoscopy with biopsies.  The patient will be scheduled today for navigational bronchoscopy with biopsies once he has had time for a plavix  wash out.   Test Results: CXR 06/04/2023 Normal cardiac size. Large right perihilar mass is noted consistent with malignancy. Mild left basilar atelectasis or infiltrate is noted. Increased right basilar opacity is noted concerning for worsening atelectasis or possibly pneumonia. Bony thorax is unremarkable.   IMPRESSION: Large right perihilar mass is again noted consistent with malignancy. Bibasilar atelectasis or infiltrates are noted.   PET scan 04/22/2023 Intensely hypermetabolic RIGHT hilar mass consistent with  primary bronchogenic carcinoma. Mass spans the RIGHT upper lobe and RIGHT lower lobe and constricts the RIGHT lower lobe bronchus 2. Hypermetabolic RIGHT lower paratracheal and subcarinal nodes consistent with nodal metastasis. 3. No evidence of distant metastatic disease. 4. Benign LEFT adrenal adenoma. 5.  Aortic Atherosclerosis (ICD10-I70.0).     03/26/2023 CT Chest with Contrast Thyroid  gland and esophagus are unremarkable. 12 mm precarinal lymph node is noted consistent with malignancy. 12 mm subcarinal lymph node is noted consistent with malignancy.   Lungs/Pleura: No pneumothorax or pleural effusion is noted. Grossly stable 6.3 x 5.2 cm right perihilar mass is noted which extends into the right upper and lower lobes, most consistent with malignancy. Some degree of postobstructive atelectasis or pneumonia is noted in right lower lobe. Scarring is noted throughout both lungs peripherally. 6.3 x 5.2 cm right perihilar mass is noted which extends into right upper and lower lobes, most consistent with malignancy. Biopsy via bronchoscopy is recommended. Mildly enlarged carinal and subcarinal lymph nodes are noted concerning for metastatic disease. Some degree of postobstructive atelectasis or pneumonia may be present in the right lower lobe.     CTA 03/16/2023 Masslike consolidation in the central right lower lobe extending from the right hilum into  the superior segment of the right lower lobe, measuring 7.9 x 5.5 cm. While this could reflect pneumonia, appearance is concerning for tumor/malignancy. Areas of peripheral fibrosis in the lungs bilaterally.  Masslike opacity in the right hilum and central right lower lobe could reflect pneumonia, but appearance is concerning for malignancy. Followup CT is recommended in 3-4 weeks following trial of antibiotic therapy to assess for resolution.  Areas of peripheral fibrosis throughout the lungs bilaterally.     Latest Ref Rng & Units  05/21/2023    2:48 PM 05/15/2023    5:10 AM 05/14/2023    7:58 AM  CBC  WBC 4.0 - 10.5 K/uL 8.7  7.9  6.4   Hemoglobin 13.0 - 17.0 g/dL 16.1  9.6  09.6   Hematocrit 39.0 - 52.0 % 36.2  30.3  34.4   Platelets 150.0 - 400.0 K/uL 338.0  217  240        Latest Ref Rng & Units 05/21/2023    2:48 PM 05/15/2023    5:10 AM 05/14/2023    7:58 AM  BMP  Glucose 70 - 99 mg/dL 045  409  811   BUN 6 - 23 mg/dL 20  16  14    Creatinine 0.40 - 1.50 mg/dL 9.14  7.82  9.56   Sodium 135 - 145 mEq/L 136  135  136   Potassium 3.5 - 5.1 mEq/L 4.7  3.7  3.9   Chloride 96 - 112 mEq/L 99  102  102   CO2 19 - 32 mEq/L 30  24  25    Calcium  8.4 - 10.5 mg/dL 21.3  8.8  9.1     BNP No results found for: "BNP"  ProBNP No results found for: "PROBNP"  PFT No results found for: "FEV1PRE", "FEV1POST", "FVCPRE", "FVCPOST", "TLC", "DLCOUNC", "PREFEV1FVCRT", "PSTFEV1FVCRT"  VAS US  CAROTID Result Date: 07/08/2023 Carotid Arterial Duplex Study Patient Name:  Jassiah A. Collier  Date of Exam:   07/08/2023 Medical Rec #: [id number]             Accession #:    [id number] Date of Birth: May 07, 1933             Patient Gender: M Patient Age:   40 years Exam Location:  [address] Procedure:      VAS US  CAROTID Referring Phys: Melodie Spry ROBINS --------------------------------------------------------------------------------  Indications:   Carotid artery disease and Right stent. Risk Factors:  Hypertension, hyperlipidemia, past history of smoking. Other Factors: 05/14/23                Right transcarotid artery revascularization_10x40 mm stent. Performing Technologist: Jenifer Miu RVT  Examination Guidelines: A complete evaluation includes B-mode imaging, spectral Doppler, color Doppler, and power Doppler as needed of all accessible portions of each vessel. Bilateral testing is considered an integral part of a complete examination. Limited examinations for reoccurring indications may be performed as noted.  Right Carotid Findings:  +----------+--------+--------+--------+------------------+--------+           PSV cm/sEDV cm/sStenosisPlaque DescriptionComments +----------+--------+--------+--------+------------------+--------+ CCA Prox  49      5                                          +----------+--------+--------+--------+------------------+--------+ CCA Distal32      10  stent    +----------+--------+--------+--------+------------------+--------+ ICA Prox  20      6                                 stent    +----------+--------+--------+--------+------------------+--------+ ICA Mid   26      6                                 stent    +----------+--------+--------+--------+------------------+--------+ ICA Distal47      13                                         +----------+--------+--------+--------+------------------+--------+ ECA       64      0                                          +----------+--------+--------+--------+------------------+--------+ +----------+--------+-------+----------------+-------------------+           PSV cm/sEDV cmsDescribe        Arm Pressure (mmHG) +----------+--------+-------+----------------+-------------------+ Subclavian72      2      Multiphasic, [id number]                 +----------+--------+-------+----------------+-------------------+ +---------+--------+-+--------+-+---------+ VertebralPSV cm/s6EDV cm/s2Antegrade +---------+--------+-+--------+-+---------+ Right Stent(s): +---------------+--------+--------+--------+--------+--------+ CCA-ICA        PSV cm/sEDV cm/sStenosisWaveformComments +---------------+--------+--------+--------+--------+--------+ Prox to Stent  32      9                                +---------------+--------+--------+--------+--------+--------+ Proximal Stent 20      6                                +---------------+--------+--------+--------+--------+--------+ Mid  Stent      25      6                                +---------------+--------+--------+--------+--------+--------+ Distal Stent   40      11                               +---------------+--------+--------+--------+--------+--------+ Distal to Stent47      13                               +---------------+--------+--------+--------+--------+--------+  Left Carotid Findings: +----------+--------+--------+--------+------------------+--------+           PSV cm/sEDV cm/sStenosisPlaque DescriptionComments +----------+--------+--------+--------+------------------+--------+ CCA Prox  44      8                                          +----------+--------+--------+--------+------------------+--------+ CCA Distal38      8                                          +----------+--------+--------+--------+------------------+--------+  ICA Prox  66      15                                         +----------+--------+--------+--------+------------------+--------+ ICA Mid   58      17                                         +----------+--------+--------+--------+------------------+--------+ ICA Distal60      17                                         +----------+--------+--------+--------+------------------+--------+ ECA       39      6                                          +----------+--------+--------+--------+------------------+--------+ +----------+--------+--------+----------------+-------------------+           PSV cm/sEDV cm/sDescribe        Arm Pressure (mmHG) +----------+--------+--------+----------------+-------------------+ Subclavian51      0       Multiphasic, [id number]                 +----------+--------+--------+----------------+-------------------+ +---------+--------+--+--------+-+---------+ VertebralPSV cm/s31EDV cm/s9Antegrade +---------+--------+--+--------+-+---------+  Summary: Right         Patent right CCA-ICA stent with  no evidence of significant Carotid:      restenosis. Left Carotid: Velocities in the left ICA are consistent with a 1-39% stenosis. Vertebrals:  Bilateral vertebral arteries demonstrate antegrade flow. Subclavians: Normal flow hemodynamics were seen in bilateral subclavian              arteries. *See table(s) above for measurements and observations.  Electronically signed by Irvin Mantel on 07/08/2023 at 11:22:56 AM.    Final      Past medical hx Past Medical History:  Diagnosis Date   DUODENITIS, WITHOUT HEMORRHAGE 07/14/2007   ERECTILE DYSFUNCTION 07/14/2007   Esophageal stricture 03/19/2016   ESOPHAGITIS 07/14/2007   EXTERNAL HEMORRHOIDS 07/14/2007   GERD 07/14/2007   Gout    HYPERTENSION 07/14/2007   PERIPHERAL NEUROPATHY 07/14/2007   Peripheral neuropathy 04/06/2019   PERIPHERAL VASCULAR DISEASE 07/14/2007   PODAGRA 07/14/2007   Prostate cancer (HCC) 02/2012   pt had screening and then biopsy   Retinal tear of right eye 03/19/2016   TOBACCO ABUSE, HX OF 02/12/2010   TONSILLECTOMY, HX OF 07/14/2007   TREMOR, ESSENTIAL 07/14/2007   VITAMIN B12 DEFICIENCY 07/14/2007     Social History   Tobacco Use   Smoking status: Former    Current packs/day: 0.75    Average packs/day: 0.8 packs/day for 29.4 years (22.0 ttl pk-yrs)    Types: Cigarettes    Start date: 02/23/1994   Smokeless tobacco: Former  Building services engineer status: Never Used  Substance Use Topics   Alcohol use: Not Currently    Comment: d/c beer about 5 months ago   Drug use: No    Mr.Decoster reports that he has quit smoking. His smoking use included cigarettes. He started smoking about 29 years ago. He has a 22 pack-year smoking history. He has quit using smokeless tobacco.  He reports that he does not currently use alcohol. He reports that he does not use drugs.  Tobacco Cessation: Counseling given: Not Answered Former smoker  Past surgical hx, Family hx, Social hx all reviewed.  Current Outpatient  Medications on File Prior to Visit  Medication Sig   allopurinol  (ZYLOPRIM ) 100 MG tablet TAKE 1 TABLET BY MOUTH EVERY DAY   aspirin  EC 81 MG tablet Take 81 mg by mouth daily. Swallow whole.   benzonatate  (TESSALON  PERLES) 100 MG capsule Take 1 capsule (100 mg total) by mouth every 6 (six) hours as needed for cough.   carvedilol  (COREG ) 3.125 MG tablet Take 1 tablet (3.125 mg total) by mouth 2 (two) times daily with a meal.   clopidogrel  (PLAVIX ) 75 MG tablet Take 1 tablet (75 mg total) by mouth daily.   Cyanocobalamin  (B-12) 3000 MCG CAPS Take 3,000 mcg by mouth daily.   diclofenac  Sodium (VOLTAREN ) 1 % GEL Apply 4 g topically 4 (four) times daily.   ezetimibe  (ZETIA ) 10 MG tablet TAKE 1 TABLET BY MOUTH EVERY DAY   omeprazole  (PRILOSEC) 40 MG capsule TAKE 1 CAPSULE BY MOUTH EVERY DAY   polyethylene glycol (MIRALAX ) 17 g packet Take 17 g by mouth daily as needed for moderate constipation or severe constipation (not helped with stool softner).   No current facility-administered medications on file prior to visit.     Allergies  Allergen Reactions   Nitrofuran Derivatives Rash   Ciprofloxacin Hcl Other (See Comments)    numbness   Statins     Intolerance, joint and muscle pain    Review Of Systems:  Constitutional:   +  weight loss, No night sweats,  Fevers, chills, fatigue, or  lassitude.  HEENT:   No headaches,  Difficulty swallowing,  Tooth/dental problems, or  Sore throat,                No sneezing, itching, ear ache, nasal congestion, post nasal drip,   CV:  + right sided  chest pain,  No Orthopnea, PND, swelling in lower extremities, anasarca, dizziness, palpitations, syncope.   GI  No heartburn, indigestion, abdominal pain, nausea, vomiting, diarrhea, change in bowel habits, loss of appetite, bloody stools.   Resp: +  shortness of breath with exertion less at rest.  + excess mucus, no productive cough,  + non-productive cough,  No coughing up of blood.  No change in color of  mucus.  No wheezing.  No chest wall deformity  Skin: no rash or lesions.  GU: no dysuria, change in color of urine, no urgency or frequency.  No flank pain, no hematuria   MS:  No joint pain or swelling.  No decreased range of motion.  No back pain.  Psych:  No change in mood or affect. No depression or anxiety.  No memory loss.   Vital Signs BP (!) 145/79 (BP Location: Right Arm, Patient Position: Sitting, Cuff Size: Normal)   Pulse 84   Ht 6' (1.829 m)   Wt 158 lb 12.8 oz (72 kg)   SpO2 95%   BMI 21.54 kg/m    Physical Exam:  General- No distress,  A&Ox3, pleasant ENT: No sinus tenderness, TM clear, pale nasal mucosa, no oral exudate,no post nasal drip, no LAN Cardiac: S1, S2, regular rate and rhythm, no murmur Chest: No wheeze/ rales/ dullness; no accessory muscle use, no nasal flaring, no sternal retractions, slightly diminished per bases Abd.: Soft Non-tender, nondistended, bowel sounds positive,Body mass index is 21.54 kg/m.  Ext: No clubbing cyanosis, edema Neuro: Physical deconditioning, moving all extremities x 4, alert and oriented x 3 Skin: No rashes, warm and dry, no obvious skin lesions Psych: normal mood and behavior   Assessment/Plan Right Parahilar Mass Incidentally discovered on CT chest during pneumonia workup.  Two CT scans 03/16/2023 and 03/26/2023 showed a mass of  7.9x5.5 cm and 6.3x5.2 cm respectively. No associated symptoms.  History of bladder cancer in 2017.  Mild weight loss over the past year. 05/09/2023 stroke and MRI demonstrating right hemispheric lesions secondary to symptomatic right ICA stenosis 05/14/2023 Right transcarotid artery revascularization-10x40 mm stent  Bronchoscopy and Biopsies delayed 2/2 stroke and need for subsequent dual anti platelet therapy. Resolved  Upper Respiratory Infection vs COPD Flare post hospitalization 04/2023 Plan I am glad you are feeling better.  Since Dr. Christia Cowboy said you can come off your Plavix  for  procedures at this point, we will go ahead and schedule the bronchoscopy. I will check with Dr. Baldwin Levee to make sure he is good to move forward with the daily aspirin  on board. I have ordered a CT chest to be done before the procedure. You will get a call to get this scheduled  I have placed an order for a bronchoscopy with biopsies.  We have discussed the procedure in detail.  We have reviewed the risks and benefits of the procedure. These include bleeding, infection, puncture of the lung, and adverse reaction to anesthesia. You have agreed to proceed with biopsy to evaluate the right hilar mass Your procedure will be done by Dr. Racheal Buddle. You will receive a letter today with date time and information pertaining to the procedure. You will need someone to drive you to the procedure, stay with you during the procedure, and stay with you after the procedure. You will also need someone to stay with you for 24 hours after anesthesia to ensure you have cleared and are doing well. You will follow-up with me 1 week after the procedure to review the results and to ensure you are doing well. Call if you need us  prior to the procedure or if you have any questions at all. Please contact office for sooner follow up if symptoms do not improve or worsen or seek emergency care    I spent 35 minutes dedicated to the care of this patient on the date of this encounter to include pre-visit review of records, face-to-face time with the patient discussing conditions above, post visit ordering of testing, clinical documentation with the electronic health record, making appropriate referrals as documented, and communicating necessary information to the patient's healthcare team.   Raejean Bullock, NP 07/09/2023  4:06 PM   [name] A. [address] [place] [address]   Dear Mr. Mansberger,   The following has been scheduled for you: Chest CT:  Saturday,  07/10/2023 at 10 AM, but check in by  9:30 AM [facility], [address], [address], [place],  [place] [address]    Procedure: Bronchoscopy                 Surgeon:  Dr. Racheal Buddle    Location:   Procedure date:  Monday, 07/26/23 at 3:15 PM Arrival Time:  12:45 PM Location: [facility] [address], Entrance [place], [place] [address]   Additional Information: Please stop the following medications:  Hold Plavix  x 5 days prior to the procedure  Do not eat anything after midnight - ok to take  medications on the day of procedure with only sips of water You will need someone to drive you home after this procedure & to be with you for the next 24 hours You may receive a call from the hospital a day or two prior to procedure to go over additional instructions & medications  NEXT VISIT:  08/03/23 at 9 AM with Dara Ear, NP at    [facility]    [address] [place], [place]  [address]

## 2023-07-09 NOTE — Patient Instructions (Addendum)
 It is good to see you today. I am glad you are feeling better.  Since Dr. Christia Cowboy said you can come off your Plavix  at this point, we will go ahead and schedule the bronchoscopy. I will check with Dr. Baldwin Levee to make sure he is good to move forward with the daily aspirin  on board. I have ordered a CT chest to be done before the procedure. You will get a call to get this scheduled  I have placed an order for a bronchoscopy with biopsies.  We have discussed the procedure in detail.  We have reviewed the risks and benefits of the procedure. These include bleeding, infection, puncture of the lung, and adverse reaction to anesthesia. You have agreed to proceed with biopsy to evaluate the  lung nodule Your procedure will be done by Dr. Racheal Buddle. You will receive a letter today with date time and information pertaining to the procedure. You will need someone to drive you to the procedure, stay with you during the procedure, and stay with you after the procedure. You will also need someone to stay with you for 24 hours after anesthesia to ensure you have cleared and are doing well. You will follow-up with me 1 week after the procedure to review the results and to ensure you are doing well. Call if you need us  prior to the procedure or if you have any questions at all. Please contact office for sooner follow up if symptoms do not improve or worsen or seek emergency care

## 2023-07-09 NOTE — Progress Notes (Signed)
 History of Present Illness Stephen Meza is a 89 y.o. male former smoker with history of bladder cancer referred 03/2023 for a lung mass. He will be followed by Dr. Baldwin Levee.   Synopsis Patient previously seen by [facility] 04/26/2023 for evaluation of a lung nodule 04/12/2023, incidentally discovered when a CT Angio done 02/2023 to evaluate for pneumonia. He was treated with antibiotics for his pneumonia , pt stated the medication  resolved his symptoms.He denied  any further fever or purulent secretions.  He had follow up CT chest  03/26/2023, which  revealed a 6.3 by 5.2 cm right parahilar mass, so slightly smaller on follow up,  but the nodule persisted.  There is also suspicion of lymph node involvement.  PET scan was done 04/22/2023 to better evaluate the lung nodule.  It showed an intensely hypermetabolic right hilar mass that was consistent with a primary bronchogenic carcinoma.  The mass spanned from the right upper lobe to the right lower lobe constricting the right lower lobe bronchus.  There was also notation of hypermetabolic right lower paratracheal and subcarinal lymph nodes that is consistent with nodal metastasis.  There was no other evidence of distant metastatic disease.   Patient has had weight loss at the March 3 office visit.  He had reported losing approximately 12 pounds over the past year.  At that time he denied any hemoptysis.  He did confirm dyspnea with exertion.  Patient quit smoking approximately 30 years ago.   Patient was scheduled for bronchoscopy with biopsies to further evaluate the nodule on 05/10/2023.  However before the patient to get procedure done he presented to the emergency room on 05/09/2023 with symptoms of stroke.  MRI brain done 05/09/2023 showed multifocal acute infarcts within the right MCA and PCA territories including the right precentral gyrus.  Additionally there was attenuated right ICA flow void at the skull base likely related to stenosis.   There was abnormal right vertebral artery V2 segment flow void consistent with slow flow or occlusion.  In addition to a distal right vertebral artery flow void.  There is also notation of mild spinal canal stenosis and severe right neural foraminal stenosis at C4-C5.  There was moderate right and severe left C5-6 and C6-7 neural for aminal stenosis noted also.   Patient was in the hospital for a total of 6 days.  He had a right transcarotid artery revascularization-10x40 mm stent  for Symptomatic right internal carotid artery stenosis that is most likely the etiology of his stroke on May 14, 2023.  He was discharged home May 15, 2023.  Patient is currently on dual antiplatelet therapy.  Patient and family understand that we cannot move forward with bronchoscopy with biopsies until it is safe for him to come off his dual antiplatelet therapy. Patient states he has been weak since the procedure.  He is receiving in-home physical therapy that is helping him regain his strength.   Patient developed worsening shortness of breath with purulent secretions and cough since released from the hospital..  Patient has been brought into the office for evaluation of this significant finding after his stroke and recent revascularization procedure.   Of note patient has follow-up with vascular surgery on Jul 08, 2023.  At that time he will have an ultrasound of this digit vessel to make sure patency.  Patient will be on dual platelet therapy until this visit.  We will reach out to the vascular surgeon to see if he feels the patient  will be able to come off dual platelet therapy anytime soon for bronchoscopy with biopsies of the lung nodule.   07/09/2023 Pt. Presents for  follow up after treatment for a suspected pneumonia/ post admission COPD Flare.  He was treated with Augmentin  after the 06/04/2023 appointment . I also asked him to use his Incentive Spirometer, and Tessalon  Perles for cough. He states he feels much  better. He looks much better than the last time we saw him.   Pt. Saw Dr. Christia Cowboy, who did his right TCAR for symptomatic ICA stenosis on 3.21.2025.Carotid duplex ultrasound was reviewed demonstrating widely patent right sided stent. Plan is  to continue to follow with a 65-month ultrasound with no plan for further intervention at this time.  Dr. Christia Cowboy  asked that [name]  continue his current medication regimen which includes aspirin , Plavix , statin. He states that the patient can hold Plavix  for procedures moving forward, but would prefer he continue his ASA.   As Dr. Christia Cowboy feels the Plavix  can be held for 48 hours to move forward with the bronchoscopy with biopsies of the right hilar mass noted on CT imaging, I will move forward with getting the patient scheduled.  Patient states he has had more right sided chest pain and is concerned that the area has perhaps increased in size.  I have ordered a super D CT to better evaluate the nodule and also to allow for navigational planning, as the last CT scan done was in January 2025.  Again I reviewed the risks of bleeding, infection, pneumothorax, and adverse reaction to anesthesia.  The patient and family verbalized understanding of these risks and want to move forward with bronchoscopy with biopsies.  The patient will be scheduled today for navigational bronchoscopy with biopsies once he has had time for a plavix  wash out.   Test Results: CXR 06/04/2023 Normal cardiac size. Large right perihilar mass is noted consistent with malignancy. Mild left basilar atelectasis or infiltrate is noted. Increased right basilar opacity is noted concerning for worsening atelectasis or possibly pneumonia. Bony thorax is unremarkable.   IMPRESSION: Large right perihilar mass is again noted consistent with malignancy. Bibasilar atelectasis or infiltrates are noted.   PET scan 04/22/2023 Intensely hypermetabolic RIGHT hilar mass consistent with  primary bronchogenic carcinoma. Mass spans the RIGHT upper lobe and RIGHT lower lobe and constricts the RIGHT lower lobe bronchus 2. Hypermetabolic RIGHT lower paratracheal and subcarinal nodes consistent with nodal metastasis. 3. No evidence of distant metastatic disease. 4. Benign LEFT adrenal adenoma. 5.  Aortic Atherosclerosis (ICD10-I70.0).     03/26/2023 CT Chest with Contrast Thyroid  gland and esophagus are unremarkable. 12 mm precarinal lymph node is noted consistent with malignancy. 12 mm subcarinal lymph node is noted consistent with malignancy.   Lungs/Pleura: No pneumothorax or pleural effusion is noted. Grossly stable 6.3 x 5.2 cm right perihilar mass is noted which extends into the right upper and lower lobes, most consistent with malignancy. Some degree of postobstructive atelectasis or pneumonia is noted in right lower lobe. Scarring is noted throughout both lungs peripherally. 6.3 x 5.2 cm right perihilar mass is noted which extends into right upper and lower lobes, most consistent with malignancy. Biopsy via bronchoscopy is recommended. Mildly enlarged carinal and subcarinal lymph nodes are noted concerning for metastatic disease. Some degree of postobstructive atelectasis or pneumonia may be present in the right lower lobe.     CTA 03/16/2023 Masslike consolidation in the central right lower lobe extending from the right hilum into  the superior segment of the right lower lobe, measuring 7.9 x 5.5 cm. While this could reflect pneumonia, appearance is concerning for tumor/malignancy. Areas of peripheral fibrosis in the lungs bilaterally.  Masslike opacity in the right hilum and central right lower lobe could reflect pneumonia, but appearance is concerning for malignancy. Followup CT is recommended in 3-4 weeks following trial of antibiotic therapy to assess for resolution.  Areas of peripheral fibrosis throughout the lungs bilaterally.     Latest Ref Rng & Units  05/21/2023    2:48 PM 05/15/2023    5:10 AM 05/14/2023    7:58 AM  CBC  WBC 4.0 - 10.5 K/uL 8.7  7.9  6.4   Hemoglobin 13.0 - 17.0 g/dL 16.1  9.6  09.6   Hematocrit 39.0 - 52.0 % 36.2  30.3  34.4   Platelets 150.0 - 400.0 K/uL 338.0  217  240        Latest Ref Rng & Units 05/21/2023    2:48 PM 05/15/2023    5:10 AM 05/14/2023    7:58 AM  BMP  Glucose 70 - 99 mg/dL 045  409  811   BUN 6 - 23 mg/dL 20  16  14    Creatinine 0.40 - 1.50 mg/dL 9.14  7.82  9.56   Sodium 135 - 145 mEq/L 136  135  136   Potassium 3.5 - 5.1 mEq/L 4.7  3.7  3.9   Chloride 96 - 112 mEq/L 99  102  102   CO2 19 - 32 mEq/L 30  24  25    Calcium  8.4 - 10.5 mg/dL 21.3  8.8  9.1     BNP No results found for: "BNP"  ProBNP No results found for: "PROBNP"  PFT No results found for: "FEV1PRE", "FEV1POST", "FVCPRE", "FVCPOST", "TLC", "DLCOUNC", "PREFEV1FVCRT", "PSTFEV1FVCRT"  VAS US  CAROTID Result Date: 07/08/2023 Carotid Arterial Duplex Study Patient Name:  Jassiah A. Collier  Date of Exam:   07/08/2023 Medical Rec #: [id number]             Accession #:    [id number] Date of Birth: May 07, 1933             Patient Gender: M Patient Age:   40 years Exam Location:  [address] Procedure:      VAS US  CAROTID Referring Phys: Melodie Spry ROBINS --------------------------------------------------------------------------------  Indications:   Carotid artery disease and Right stent. Risk Factors:  Hypertension, hyperlipidemia, past history of smoking. Other Factors: 05/14/23                Right transcarotid artery revascularization_10x40 mm stent. Performing Technologist: Jenifer Miu RVT  Examination Guidelines: A complete evaluation includes B-mode imaging, spectral Doppler, color Doppler, and power Doppler as needed of all accessible portions of each vessel. Bilateral testing is considered an integral part of a complete examination. Limited examinations for reoccurring indications may be performed as noted.  Right Carotid Findings:  +----------+--------+--------+--------+------------------+--------+           PSV cm/sEDV cm/sStenosisPlaque DescriptionComments +----------+--------+--------+--------+------------------+--------+ CCA Prox  49      5                                          +----------+--------+--------+--------+------------------+--------+ CCA Distal32      10  stent    +----------+--------+--------+--------+------------------+--------+ ICA Prox  20      6                                 stent    +----------+--------+--------+--------+------------------+--------+ ICA Mid   26      6                                 stent    +----------+--------+--------+--------+------------------+--------+ ICA Distal47      13                                         +----------+--------+--------+--------+------------------+--------+ ECA       64      0                                          +----------+--------+--------+--------+------------------+--------+ +----------+--------+-------+----------------+-------------------+           PSV cm/sEDV cmsDescribe        Arm Pressure (mmHG) +----------+--------+-------+----------------+-------------------+ Subclavian72      2      Multiphasic, [id number]                 +----------+--------+-------+----------------+-------------------+ +---------+--------+-+--------+-+---------+ VertebralPSV cm/s6EDV cm/s2Antegrade +---------+--------+-+--------+-+---------+ Right Stent(s): +---------------+--------+--------+--------+--------+--------+ CCA-ICA        PSV cm/sEDV cm/sStenosisWaveformComments +---------------+--------+--------+--------+--------+--------+ Prox to Stent  32      9                                +---------------+--------+--------+--------+--------+--------+ Proximal Stent 20      6                                +---------------+--------+--------+--------+--------+--------+ Mid  Stent      25      6                                +---------------+--------+--------+--------+--------+--------+ Distal Stent   40      11                               +---------------+--------+--------+--------+--------+--------+ Distal to Stent47      13                               +---------------+--------+--------+--------+--------+--------+  Left Carotid Findings: +----------+--------+--------+--------+------------------+--------+           PSV cm/sEDV cm/sStenosisPlaque DescriptionComments +----------+--------+--------+--------+------------------+--------+ CCA Prox  44      8                                          +----------+--------+--------+--------+------------------+--------+ CCA Distal38      8                                          +----------+--------+--------+--------+------------------+--------+  ICA Prox  66      15                                         +----------+--------+--------+--------+------------------+--------+ ICA Mid   58      17                                         +----------+--------+--------+--------+------------------+--------+ ICA Distal60      17                                         +----------+--------+--------+--------+------------------+--------+ ECA       39      6                                          +----------+--------+--------+--------+------------------+--------+ +----------+--------+--------+----------------+-------------------+           PSV cm/sEDV cm/sDescribe        Arm Pressure (mmHG) +----------+--------+--------+----------------+-------------------+ Subclavian51      0       Multiphasic, [id number]                 +----------+--------+--------+----------------+-------------------+ +---------+--------+--+--------+-+---------+ VertebralPSV cm/s31EDV cm/s9Antegrade +---------+--------+--+--------+-+---------+  Summary: Right         Patent right CCA-ICA stent with  no evidence of significant Carotid:      restenosis. Left Carotid: Velocities in the left ICA are consistent with a 1-39% stenosis. Vertebrals:  Bilateral vertebral arteries demonstrate antegrade flow. Subclavians: Normal flow hemodynamics were seen in bilateral subclavian              arteries. *See table(s) above for measurements and observations.  Electronically signed by Irvin Mantel on 07/08/2023 at 11:22:56 AM.    Final      Past medical hx Past Medical History:  Diagnosis Date   DUODENITIS, WITHOUT HEMORRHAGE 07/14/2007   ERECTILE DYSFUNCTION 07/14/2007   Esophageal stricture 03/19/2016   ESOPHAGITIS 07/14/2007   EXTERNAL HEMORRHOIDS 07/14/2007   GERD 07/14/2007   Gout    HYPERTENSION 07/14/2007   PERIPHERAL NEUROPATHY 07/14/2007   Peripheral neuropathy 04/06/2019   PERIPHERAL VASCULAR DISEASE 07/14/2007   PODAGRA 07/14/2007   Prostate cancer (HCC) 02/2012   pt had screening and then biopsy   Retinal tear of right eye 03/19/2016   TOBACCO ABUSE, HX OF 02/12/2010   TONSILLECTOMY, HX OF 07/14/2007   TREMOR, ESSENTIAL 07/14/2007   VITAMIN B12 DEFICIENCY 07/14/2007     Social History   Tobacco Use   Smoking status: Former    Current packs/day: 0.75    Average packs/day: 0.8 packs/day for 29.4 years (22.0 ttl pk-yrs)    Types: Cigarettes    Start date: 02/23/1994   Smokeless tobacco: Former  Building services engineer status: Never Used  Substance Use Topics   Alcohol use: Not Currently    Comment: d/c beer about 5 months ago   Drug use: No    Mr.Decoster reports that he has quit smoking. His smoking use included cigarettes. He started smoking about 29 years ago. He has a 22 pack-year smoking history. He has quit using smokeless tobacco.  He reports that he does not currently use alcohol. He reports that he does not use drugs.  Tobacco Cessation: Counseling given: Not Answered Former smoker  Past surgical hx, Family hx, Social hx all reviewed.  Current Outpatient  Medications on File Prior to Visit  Medication Sig   allopurinol  (ZYLOPRIM ) 100 MG tablet TAKE 1 TABLET BY MOUTH EVERY DAY   aspirin  EC 81 MG tablet Take 81 mg by mouth daily. Swallow whole.   benzonatate  (TESSALON  PERLES) 100 MG capsule Take 1 capsule (100 mg total) by mouth every 6 (six) hours as needed for cough.   carvedilol  (COREG ) 3.125 MG tablet Take 1 tablet (3.125 mg total) by mouth 2 (two) times daily with a meal.   clopidogrel  (PLAVIX ) 75 MG tablet Take 1 tablet (75 mg total) by mouth daily.   Cyanocobalamin  (B-12) 3000 MCG CAPS Take 3,000 mcg by mouth daily.   diclofenac  Sodium (VOLTAREN ) 1 % GEL Apply 4 g topically 4 (four) times daily.   ezetimibe  (ZETIA ) 10 MG tablet TAKE 1 TABLET BY MOUTH EVERY DAY   omeprazole  (PRILOSEC) 40 MG capsule TAKE 1 CAPSULE BY MOUTH EVERY DAY   polyethylene glycol (MIRALAX ) 17 g packet Take 17 g by mouth daily as needed for moderate constipation or severe constipation (not helped with stool softner).   No current facility-administered medications on file prior to visit.     Allergies  Allergen Reactions   Nitrofuran Derivatives Rash   Ciprofloxacin Hcl Other (See Comments)    numbness   Statins     Intolerance, joint and muscle pain    Review Of Systems:  Constitutional:   +  weight loss, No night sweats,  Fevers, chills, fatigue, or  lassitude.  HEENT:   No headaches,  Difficulty swallowing,  Tooth/dental problems, or  Sore throat,                No sneezing, itching, ear ache, nasal congestion, post nasal drip,   CV:  + right sided  chest pain,  No Orthopnea, PND, swelling in lower extremities, anasarca, dizziness, palpitations, syncope.   GI  No heartburn, indigestion, abdominal pain, nausea, vomiting, diarrhea, change in bowel habits, loss of appetite, bloody stools.   Resp: +  shortness of breath with exertion less at rest.  + excess mucus, no productive cough,  + non-productive cough,  No coughing up of blood.  No change in color of  mucus.  No wheezing.  No chest wall deformity  Skin: no rash or lesions.  GU: no dysuria, change in color of urine, no urgency or frequency.  No flank pain, no hematuria   MS:  No joint pain or swelling.  No decreased range of motion.  No back pain.  Psych:  No change in mood or affect. No depression or anxiety.  No memory loss.   Vital Signs BP (!) 145/79 (BP Location: Right Arm, Patient Position: Sitting, Cuff Size: Normal)   Pulse 84   Ht 6' (1.829 m)   Wt 158 lb 12.8 oz (72 kg)   SpO2 95%   BMI 21.54 kg/m    Physical Exam:  General- No distress,  A&Ox3, pleasant ENT: No sinus tenderness, TM clear, pale nasal mucosa, no oral exudate,no post nasal drip, no LAN Cardiac: S1, S2, regular rate and rhythm, no murmur Chest: No wheeze/ rales/ dullness; no accessory muscle use, no nasal flaring, no sternal retractions, slightly diminished per bases Abd.: Soft Non-tender, nondistended, bowel sounds positive,Body mass index is 21.54 kg/m.  Ext: No clubbing cyanosis, edema Neuro: Physical deconditioning, moving all extremities x 4, alert and oriented x 3 Skin: No rashes, warm and dry, no obvious skin lesions Psych: normal mood and behavior   Assessment/Plan Right Parahilar Mass Incidentally discovered on CT chest during pneumonia workup.  Two CT scans 03/16/2023 and 03/26/2023 showed a mass of  7.9x5.5 cm and 6.3x5.2 cm respectively. No associated symptoms.  History of bladder cancer in 2017.  Mild weight loss over the past year. 05/09/2023 stroke and MRI demonstrating right hemispheric lesions secondary to symptomatic right ICA stenosis 05/14/2023 Right transcarotid artery revascularization-10x40 mm stent  Bronchoscopy and Biopsies delayed 2/2 stroke and need for subsequent dual anti platelet therapy. Resolved  Upper Respiratory Infection vs COPD Flare post hospitalization 04/2023 Plan I am glad you are feeling better.  Since Dr. Christia Cowboy said you can come off your Plavix  for  procedures at this point, we will go ahead and schedule the bronchoscopy. I will check with Dr. Baldwin Levee to make sure he is good to move forward with the daily aspirin  on board. I have ordered a CT chest to be done before the procedure. You will get a call to get this scheduled  I have placed an order for a bronchoscopy with biopsies.  We have discussed the procedure in detail.  We have reviewed the risks and benefits of the procedure. These include bleeding, infection, puncture of the lung, and adverse reaction to anesthesia. You have agreed to proceed with biopsy to evaluate the right hilar mass Your procedure will be done by Dr. Racheal Buddle. You will receive a letter today with date time and information pertaining to the procedure. You will need someone to drive you to the procedure, stay with you during the procedure, and stay with you after the procedure. You will also need someone to stay with you for 24 hours after anesthesia to ensure you have cleared and are doing well. You will follow-up with me 1 week after the procedure to review the results and to ensure you are doing well. Call if you need us  prior to the procedure or if you have any questions at all. Please contact office for sooner follow up if symptoms do not improve or worsen or seek emergency care    I spent 35 minutes dedicated to the care of this patient on the date of this encounter to include pre-visit review of records, face-to-face time with the patient discussing conditions above, post visit ordering of testing, clinical documentation with the electronic health record, making appropriate referrals as documented, and communicating necessary information to the patient's healthcare team.   Raejean Bullock, NP 07/09/2023  4:06 PM   [name] A. [address] [place] [address]   Dear Mr. Mansberger,   The following has been scheduled for you: Chest CT:  Saturday,  07/10/2023 at 10 AM, but check in by  9:30 AM [facility], [address], [address], [place],  [place] [address]    Procedure: Bronchoscopy                 Surgeon:  Dr. Racheal Buddle    Location:   Procedure date:  Monday, 07/26/23 at 3:15 PM Arrival Time:  12:45 PM Location: [facility] [address], Entrance [place], [place] [address]   Additional Information: Please stop the following medications:  Hold Plavix  x 5 days prior to the procedure  Do not eat anything after midnight - ok to take  medications on the day of procedure with only sips of water You will need someone to drive you home after this procedure & to be with you for the next 24 hours You may receive a call from the hospital a day or two prior to procedure to go over additional instructions & medications  NEXT VISIT:  08/03/23 at 9 AM with Dara Ear, NP at    [facility]    [address] [place], [place]  [address]

## 2023-07-09 NOTE — Telephone Encounter (Signed)
 07/09/2023 @ 1432  Pt's wife called to report right shoulder joint pain that started after pt's TCAR in March, 20025. Pt had an office with Dr. Rosalva Comber on 07/10/23 but forgot to mention the shoulder pain.   Pt's wife reports putting "arthritis cream" on his shoulder that he uses on his knees per a recommendation from his PCP. Pt's wife reports that he lies on his right side every night while reading and sleeping. Pt's wife was told to continue monitoring his shoulder pain and if it gets worse and/or he has numbness or discoloration in his arm/hand to call us  back or go to the ED.   Pt's wife agreed to these recommendations.

## 2023-07-10 ENCOUNTER — Ambulatory Visit (HOSPITAL_BASED_OUTPATIENT_CLINIC_OR_DEPARTMENT_OTHER)
Admission: RE | Admit: 2023-07-10 | Discharge: 2023-07-10 | Disposition: A | Discharge status: Home / Self Care | Source: Ambulatory Visit | Attending: Acute Care | Admitting: Acute Care

## 2023-07-10 DIAGNOSIS — R918 Other nonspecific abnormal finding of lung field: Secondary | ICD-10-CM | POA: Diagnosis not present

## 2023-07-10 DIAGNOSIS — J8410 Pulmonary fibrosis, unspecified: Secondary | ICD-10-CM | POA: Diagnosis not present

## 2023-07-10 DIAGNOSIS — R911 Solitary pulmonary nodule: Secondary | ICD-10-CM | POA: Diagnosis not present

## 2023-07-10 DIAGNOSIS — J439 Emphysema, unspecified: Secondary | ICD-10-CM | POA: Diagnosis not present

## 2023-07-12 ENCOUNTER — Other Ambulatory Visit: Payer: Self-pay | Admitting: Orthopedic Surgery

## 2023-07-12 ENCOUNTER — Other Ambulatory Visit: Payer: Self-pay | Admitting: Family Medicine

## 2023-07-12 DIAGNOSIS — I69354 Hemiplegia and hemiparesis following cerebral infarction affecting left non-dominant side: Secondary | ICD-10-CM | POA: Diagnosis not present

## 2023-07-12 DIAGNOSIS — I499 Cardiac arrhythmia, unspecified: Secondary | ICD-10-CM

## 2023-07-12 DIAGNOSIS — Z8551 Personal history of malignant neoplasm of bladder: Secondary | ICD-10-CM | POA: Diagnosis not present

## 2023-07-12 DIAGNOSIS — M4802 Spinal stenosis, cervical region: Secondary | ICD-10-CM | POA: Diagnosis not present

## 2023-07-12 DIAGNOSIS — Z48812 Encounter for surgical aftercare following surgery on the circulatory system: Secondary | ICD-10-CM | POA: Diagnosis not present

## 2023-07-12 DIAGNOSIS — Z7902 Long term (current) use of antithrombotics/antiplatelets: Secondary | ICD-10-CM | POA: Diagnosis not present

## 2023-07-12 DIAGNOSIS — I10 Essential (primary) hypertension: Secondary | ICD-10-CM | POA: Diagnosis not present

## 2023-07-12 DIAGNOSIS — D519 Vitamin B12 deficiency anemia, unspecified: Secondary | ICD-10-CM | POA: Diagnosis not present

## 2023-07-12 DIAGNOSIS — M1990 Unspecified osteoarthritis, unspecified site: Secondary | ICD-10-CM | POA: Diagnosis not present

## 2023-07-12 DIAGNOSIS — M109 Gout, unspecified: Secondary | ICD-10-CM | POA: Diagnosis not present

## 2023-07-12 DIAGNOSIS — R911 Solitary pulmonary nodule: Secondary | ICD-10-CM | POA: Diagnosis not present

## 2023-07-12 DIAGNOSIS — G720 Drug-induced myopathy: Secondary | ICD-10-CM | POA: Diagnosis not present

## 2023-07-13 ENCOUNTER — Other Ambulatory Visit: Payer: Self-pay | Admitting: *Deleted

## 2023-07-13 DIAGNOSIS — I6523 Occlusion and stenosis of bilateral carotid arteries: Secondary | ICD-10-CM

## 2023-07-13 DIAGNOSIS — Z9862 Peripheral vascular angioplasty status: Secondary | ICD-10-CM

## 2023-07-14 DIAGNOSIS — C44229 Squamous cell carcinoma of skin of left ear and external auricular canal: Secondary | ICD-10-CM | POA: Diagnosis not present

## 2023-07-14 DIAGNOSIS — L570 Actinic keratosis: Secondary | ICD-10-CM | POA: Diagnosis not present

## 2023-07-14 DIAGNOSIS — Z85828 Personal history of other malignant neoplasm of skin: Secondary | ICD-10-CM | POA: Diagnosis not present

## 2023-07-14 DIAGNOSIS — L821 Other seborrheic keratosis: Secondary | ICD-10-CM | POA: Diagnosis not present

## 2023-07-14 DIAGNOSIS — D225 Melanocytic nevi of trunk: Secondary | ICD-10-CM | POA: Diagnosis not present

## 2023-07-14 DIAGNOSIS — D692 Other nonthrombocytopenic purpura: Secondary | ICD-10-CM | POA: Diagnosis not present

## 2023-07-15 DIAGNOSIS — M109 Gout, unspecified: Secondary | ICD-10-CM | POA: Diagnosis not present

## 2023-07-15 DIAGNOSIS — I499 Cardiac arrhythmia, unspecified: Secondary | ICD-10-CM | POA: Diagnosis not present

## 2023-07-15 DIAGNOSIS — G720 Drug-induced myopathy: Secondary | ICD-10-CM | POA: Diagnosis not present

## 2023-07-15 DIAGNOSIS — D519 Vitamin B12 deficiency anemia, unspecified: Secondary | ICD-10-CM | POA: Diagnosis not present

## 2023-07-15 DIAGNOSIS — I69354 Hemiplegia and hemiparesis following cerebral infarction affecting left non-dominant side: Secondary | ICD-10-CM | POA: Diagnosis not present

## 2023-07-15 DIAGNOSIS — R911 Solitary pulmonary nodule: Secondary | ICD-10-CM | POA: Diagnosis not present

## 2023-07-15 DIAGNOSIS — M1990 Unspecified osteoarthritis, unspecified site: Secondary | ICD-10-CM | POA: Diagnosis not present

## 2023-07-15 DIAGNOSIS — Z48812 Encounter for surgical aftercare following surgery on the circulatory system: Secondary | ICD-10-CM | POA: Diagnosis not present

## 2023-07-15 DIAGNOSIS — M4802 Spinal stenosis, cervical region: Secondary | ICD-10-CM | POA: Diagnosis not present

## 2023-07-15 DIAGNOSIS — I10 Essential (primary) hypertension: Secondary | ICD-10-CM | POA: Diagnosis not present

## 2023-07-15 DIAGNOSIS — Z7902 Long term (current) use of antithrombotics/antiplatelets: Secondary | ICD-10-CM | POA: Diagnosis not present

## 2023-07-15 DIAGNOSIS — Z8551 Personal history of malignant neoplasm of bladder: Secondary | ICD-10-CM | POA: Diagnosis not present

## 2023-07-22 ENCOUNTER — Other Ambulatory Visit: Payer: Self-pay

## 2023-07-22 ENCOUNTER — Other Ambulatory Visit: Payer: Self-pay | Admitting: Acute Care

## 2023-07-22 ENCOUNTER — Encounter (HOSPITAL_COMMUNITY): Payer: Self-pay | Admitting: Emergency Medicine

## 2023-07-22 ENCOUNTER — Telehealth: Payer: Self-pay

## 2023-07-22 DIAGNOSIS — R911 Solitary pulmonary nodule: Secondary | ICD-10-CM | POA: Insufficient documentation

## 2023-07-22 DIAGNOSIS — R051 Acute cough: Secondary | ICD-10-CM

## 2023-07-22 DIAGNOSIS — J069 Acute upper respiratory infection, unspecified: Secondary | ICD-10-CM

## 2023-07-22 NOTE — Telephone Encounter (Signed)
 Confirmed case request order has been placed by SG. Nothing further needed.

## 2023-07-22 NOTE — Pre-Procedure Instructions (Signed)
-------------    SDW INSTRUCTIONS given:  Your procedure is scheduled on 6/2.  Report to [facility] "A" at 12:45 P.M., and check in at the Admitting office.  Any questions or running late day of surgery: call [phone]    Remember:  Do not eat or drink after midnight the night before your surgery    Take these medicines the morning of surgery with A SIP OF WATER  allopurinol , coreg , zetia , omeprazole          May take these medicines IF NEEDED:  benzonatate      Stop taking Plavix  5 days prior to surgery. Last dose 5/27.  Follow your surgeon's instructions on when to stop Aspirin .  If no instructions were given by your surgeon then you will need to call the office to get those instructions.    As of today, STOP taking any Aleve, Naproxen, Ibuprofen, Motrin, Advil, Goody's, BC's, all herbal medications, fish oil, and all vitamins. This includes diclofenac  Sodium (VOLTAREN ) gel.    Do NOT Smoke (Tobacco/Vaping) 24 hours prior to your procedure  If you use a CPAP at night, you may bring all equipment for your overnight stay.     You will be asked to remove any contacts, glasses, piercing's, hearing aid's, dentures/partials prior to surgery. Please bring cases for these items if needed.     Patients discharged the day of surgery will not be allowed to drive home, and someone needs to stay with them for 24 hours.  SURGICAL WAITING ROOM VISITATION Patients may have no more than 2 support people in the waiting area - these visitors may rotate.   Pre-op nurse will coordinate an appropriate time for 1 ADULT support person, who may not rotate, to accompany patient in pre-op.  Children under the age of 60 must have an adult with them who is not the patient and must remain in the main waiting area with an adult.  If the patient needs to stay at the hospital during part of their recovery, the visitor guidelines for inpatient rooms apply.  Please refer to the [facility]  website for the visitor guidelines for any additional information.   Special instructions:   [facility]- Preparing For Surgery   Please follow these instructions carefully.   Shower the NIGHT BEFORE SURGERY and the MORNING OF SURGERY with DIAL Soap.   Pat yourself dry with a CLEAN TOWEL.  Wear CLEAN PAJAMAS to bed the night before surgery  Place CLEAN SHEETS on your bed the night of your first shower and DO NOT SLEEP WITH PETS.   Additional instructions for the day of surgery: DO NOT APPLY any lotions, deodorants, cologne, or perfumes.   Do not wear jewelry or makeup Do not wear nail polish, gel polish, artificial nails, or any other type of covering on natural nails (fingers and toes) Do not bring valuables to the hospital. [facility] is not responsible for valuables/personal belongings. Put on clean/comfortable clothes.  Please brush your teeth.  Ask your nurse before applying any prescription medications to the skin.

## 2023-07-22 NOTE — Telephone Encounter (Signed)
 Copied from CRM [id number]. Topic: Clinical - Request for Lab/Test Order >> Jul 22, 2023  9:56 AM Juliana Ocean wrote: Reason for CRM: Deborrah Fam w/ [facility] hospital needs surgical orders for this pt's surgery on Monday 07/26/2023 w/ Dr Baldwin Levee. Please put in Epic.  Cb w/ any questions or concerns:  [phone]

## 2023-07-22 NOTE — Telephone Encounter (Signed)
 Yes, I am thinking so. You placed referral order at [facility], looks like case request is what's missing.

## 2023-07-22 NOTE — Telephone Encounter (Signed)
 Please advise pt was last seen with SG and Bronch was arranged to be performed on 07/26/2023.

## 2023-07-22 NOTE — Telephone Encounter (Signed)
 I will place the procedure orders./

## 2023-07-22 NOTE — Progress Notes (Signed)
 PCP - Casimer Clear, FNP Cardiologist - denies  PPM/ICD - denies   Chest x-ray - 06/04/23 EKG - 05/09/23 Stress Test - denies ECHO - 05/11/23 Cardiac Cath - denies  CPAP - denies  DM- denies  Blood Thinner Instructions: hold Plavix  5 days. Last dose 5/27 Aspirin  Instructions: f/u with surgeon  ERAS Protcol - no, NPO  COVID TEST- n/a  Anesthesia review: yes, pt had a stroke on 05/09/23, followed by a TCAR on 3/21.  Patient verbally denies any shortness of breath, fever, cough and chest pain during phone call      Questions were answered. Patient verbalized understanding of instructions.

## 2023-07-22 NOTE — Telephone Encounter (Signed)
 Copied from CRM [id number]. Topic: Clinical - Medical Advice >> Jul 22, 2023  2:18 PM Tyronne Galloway wrote: Reason for CRM: Pt's wife Conner Muegge is calling in requesting to know if the pt should stop taking the small amount of Asprin ahead of the pt's biopsy on 07/27/2023. Pt has already stopped taking the blood thinners. Pt was told to ask NP Dara Ear. Please call the pt back at [phone] ok to leave a vm.  LVM to inform them to stop aspirin  2 days prior to procedure.Also sent mychart msg

## 2023-07-26 ENCOUNTER — Ambulatory Visit (HOSPITAL_COMMUNITY)

## 2023-07-26 ENCOUNTER — Ambulatory Visit (HOSPITAL_COMMUNITY): Admitting: Physician Assistant

## 2023-07-26 ENCOUNTER — Other Ambulatory Visit: Payer: Self-pay

## 2023-07-26 ENCOUNTER — Ambulatory Visit (HOSPITAL_COMMUNITY)
Admission: RE | Admit: 2023-07-26 | Discharge: 2023-07-26 | Disposition: A | Discharge status: Home / Self Care | Attending: Emergency Medicine | Admitting: Emergency Medicine

## 2023-07-26 ENCOUNTER — Encounter (HOSPITAL_COMMUNITY)
Admission: RE | Disposition: A | Discharge status: Home / Self Care | Payer: Self-pay | Source: Home / Self Care | Attending: Emergency Medicine

## 2023-07-26 ENCOUNTER — Encounter (HOSPITAL_COMMUNITY): Payer: Self-pay | Admitting: Emergency Medicine

## 2023-07-26 DIAGNOSIS — C3431 Malignant neoplasm of lower lobe, right bronchus or lung: Secondary | ICD-10-CM | POA: Insufficient documentation

## 2023-07-26 DIAGNOSIS — I700 Atherosclerosis of aorta: Secondary | ICD-10-CM | POA: Insufficient documentation

## 2023-07-26 DIAGNOSIS — M1990 Unspecified osteoarthritis, unspecified site: Secondary | ICD-10-CM | POA: Insufficient documentation

## 2023-07-26 DIAGNOSIS — K219 Gastro-esophageal reflux disease without esophagitis: Secondary | ICD-10-CM | POA: Diagnosis not present

## 2023-07-26 DIAGNOSIS — Z8551 Personal history of malignant neoplasm of bladder: Secondary | ICD-10-CM | POA: Insufficient documentation

## 2023-07-26 DIAGNOSIS — R0602 Shortness of breath: Secondary | ICD-10-CM | POA: Insufficient documentation

## 2023-07-26 DIAGNOSIS — E782 Mixed hyperlipidemia: Secondary | ICD-10-CM

## 2023-07-26 DIAGNOSIS — Z791 Long term (current) use of non-steroidal anti-inflammatories (NSAID): Secondary | ICD-10-CM | POA: Diagnosis not present

## 2023-07-26 DIAGNOSIS — R918 Other nonspecific abnormal finding of lung field: Secondary | ICD-10-CM

## 2023-07-26 DIAGNOSIS — Z87891 Personal history of nicotine dependence: Secondary | ICD-10-CM | POA: Insufficient documentation

## 2023-07-26 DIAGNOSIS — Z7982 Long term (current) use of aspirin: Secondary | ICD-10-CM | POA: Diagnosis not present

## 2023-07-26 DIAGNOSIS — C3411 Malignant neoplasm of upper lobe, right bronchus or lung: Secondary | ICD-10-CM | POA: Diagnosis not present

## 2023-07-26 DIAGNOSIS — Z79899 Other long term (current) drug therapy: Secondary | ICD-10-CM | POA: Diagnosis not present

## 2023-07-26 DIAGNOSIS — I739 Peripheral vascular disease, unspecified: Secondary | ICD-10-CM | POA: Diagnosis not present

## 2023-07-26 DIAGNOSIS — M4802 Spinal stenosis, cervical region: Secondary | ICD-10-CM | POA: Insufficient documentation

## 2023-07-26 DIAGNOSIS — I10 Essential (primary) hypertension: Secondary | ICD-10-CM | POA: Insufficient documentation

## 2023-07-26 DIAGNOSIS — D3502 Benign neoplasm of left adrenal gland: Secondary | ICD-10-CM | POA: Diagnosis not present

## 2023-07-26 DIAGNOSIS — Z8673 Personal history of transient ischemic attack (TIA), and cerebral infarction without residual deficits: Secondary | ICD-10-CM | POA: Diagnosis not present

## 2023-07-26 DIAGNOSIS — Z7902 Long term (current) use of antithrombotics/antiplatelets: Secondary | ICD-10-CM | POA: Diagnosis not present

## 2023-07-26 DIAGNOSIS — R911 Solitary pulmonary nodule: Secondary | ICD-10-CM

## 2023-07-26 DIAGNOSIS — C3481 Malignant neoplasm of overlapping sites of right bronchus and lung: Secondary | ICD-10-CM | POA: Diagnosis not present

## 2023-07-26 HISTORY — PX: BRONCHIAL BIOPSY: SHX5109

## 2023-07-26 HISTORY — PX: VIDEO BRONCHOSCOPY: SHX5072

## 2023-07-26 HISTORY — PX: BRONCHIAL BRUSHINGS: SHX5108

## 2023-07-26 LAB — BASIC METABOLIC PANEL WITH GFR
Anion gap: 9 (ref 5–15)
BUN: 15 mg/dL (ref 8–23)
CO2: 25 mmol/L (ref 22–32)
Calcium: 9.1 mg/dL (ref 8.9–10.3)
Chloride: 101 mmol/L (ref 98–111)
Creatinine, Ser: 0.79 mg/dL (ref 0.61–1.24)
GFR, Estimated: >60 mL/min (ref >60)
Glucose, Bld: 104 mg/dL — ABNORMAL HIGH (ref 70–99)
Potassium: 4.5 mmol/L (ref 3.5–5.1)
Sodium: 135 mmol/L (ref 135–145)

## 2023-07-26 LAB — CBC
HCT: 36.8 % — ABNORMAL LOW (ref 39.0–52.0)
Hemoglobin: 11.4 g/dL — ABNORMAL LOW (ref 13.0–17.0)
MCH: 27.5 pg (ref 26.0–34.0)
MCHC: 31 g/dL (ref 30.0–36.0)
MCV: 88.7 fL (ref 80.0–100.0)
Platelets: 253 10*3/uL (ref 150–400)
RBC: 4.15 MIL/uL — ABNORMAL LOW (ref 4.22–5.81)
RDW: 14.9 % (ref 11.5–15.5)
WBC: 7.6 10*3/uL (ref 4.0–10.5)
nRBC: 0 % (ref 0.0–0.2)

## 2023-07-26 SURGERY — BRONCHOSCOPY, WITH BIOPSY USING ELECTROMAGNETIC NAVIGATION
Anesthesia: General | Laterality: Right

## 2023-07-26 SURGERY — VIDEO BRONCHOSCOPY WITHOUT FLUORO
Anesthesia: General

## 2023-07-26 MED ORDER — CLOPIDOGREL BISULFATE 75 MG PO TABS
75.0000 mg | ORAL_TABLET | Freq: Every day | ORAL | Status: DC
Start: 2023-07-26 — End: 2023-08-23

## 2023-07-26 MED ORDER — PHENYLEPHRINE HCL-NACL 20-0.9 MG/250ML-% IV SOLN
INTRAVENOUS | Status: DC | PRN
  Administered 2023-07-26: 40 ug/min via INTRAVENOUS

## 2023-07-26 MED ORDER — LIDOCAINE 2% (20 MG/ML) 5 ML SYRINGE
INTRAMUSCULAR | Status: DC | PRN
  Administered 2023-07-26: 40 mg via INTRAVENOUS

## 2023-07-26 MED ORDER — ASPIRIN 81 MG PO TBEC: 81.0000 mg | DELAYED_RELEASE_TABLET | Freq: Every day | ORAL | Status: DC

## 2023-07-26 MED ORDER — CHLORHEXIDINE GLUCONATE 0.12 % MT SOLN
15.0000 mL | Freq: Once | OROMUCOSAL | Status: AC
  Administered 2023-07-26: 15 mL via OROMUCOSAL
  Filled 2023-07-26: qty 15

## 2023-07-26 MED ORDER — PROPOFOL 10 MG/ML IV BOLUS
INTRAVENOUS | Status: DC | PRN
  Administered 2023-07-26: 80 mg via INTRAVENOUS

## 2023-07-26 MED ORDER — LACTATED RINGERS IV SOLN: INTRAVENOUS | Status: DC

## 2023-07-26 MED ORDER — SODIUM CHLORIDE (PF) 0.9 % IJ SOLN
PREFILLED_SYRINGE | INTRAMUSCULAR | Status: DC | PRN
  Administered 2023-07-26 (×4): 2 mL

## 2023-07-26 MED ORDER — ONDANSETRON HCL 4 MG/2ML IJ SOLN
INTRAMUSCULAR | Status: DC | PRN
Start: 2023-07-26 — End: 2023-07-26
  Administered 2023-07-26: 4 mg via INTRAVENOUS

## 2023-07-26 MED ORDER — SUGAMMADEX SODIUM 200 MG/2ML IV SOLN
INTRAVENOUS | Status: DC | PRN
  Administered 2023-07-26: 200 mg via INTRAVENOUS

## 2023-07-26 MED ORDER — PROPOFOL 500 MG/50ML IV EMUL
INTRAVENOUS | Status: DC | PRN
  Administered 2023-07-26: 100 ug/kg/min via INTRAVENOUS

## 2023-07-26 MED ORDER — FENTANYL CITRATE (PF) 250 MCG/5ML IJ SOLN
INTRAMUSCULAR | Status: DC | PRN
  Administered 2023-07-26 (×4): 25 ug via INTRAVENOUS

## 2023-07-26 MED ORDER — DEXAMETHASONE SODIUM PHOSPHATE 10 MG/ML IJ SOLN
INTRAMUSCULAR | Status: DC | PRN
Start: 2023-07-26 — End: 2023-07-26
  Administered 2023-07-26: 10 mg via INTRAVENOUS

## 2023-07-26 MED ORDER — ROCURONIUM BROMIDE 10 MG/ML (PF) SYRINGE
PREFILLED_SYRINGE | INTRAVENOUS | Status: DC | PRN
  Administered 2023-07-26: 50 mg via INTRAVENOUS
  Administered 2023-07-26: 20 mg via INTRAVENOUS

## 2023-07-26 MED ORDER — PHENYLEPHRINE 80 MCG/ML (10ML) SYRINGE FOR IV PUSH (FOR BLOOD PRESSURE SUPPORT)
PREFILLED_SYRINGE | INTRAVENOUS | Status: DC | PRN
  Administered 2023-07-26: 160 ug via INTRAVENOUS

## 2023-07-26 MED ORDER — GLYCOPYRROLATE 0.2 MG/ML IJ SOLN
INTRAMUSCULAR | Status: DC | PRN
Start: 2023-07-26 — End: 2023-07-26
  Administered 2023-07-26: .1 mg via INTRAVENOUS

## 2023-07-26 MED ORDER — EPHEDRINE SULFATE-NACL 50-0.9 MG/10ML-% IV SOSY
PREFILLED_SYRINGE | INTRAVENOUS | Status: DC | PRN
  Administered 2023-07-26: 10 mg via INTRAVENOUS
  Administered 2023-07-26: 5 mg via INTRAVENOUS

## 2023-07-26 MED ORDER — EPINEPHRINE 1 MG/10ML IJ SOSY
PREFILLED_SYRINGE | INTRAMUSCULAR | Status: AC
Start: 2023-07-26 — End: ?
  Filled 2023-07-26: qty 10

## 2023-07-26 NOTE — Interval H&P Note (Signed)
 History and Physical Interval Note:  07/26/2023 10:16 AM  Stephen Meza  has presented today for surgery, with the diagnosis of LUNG MASS RIGHT.  The various methods of treatment have been discussed with the patient and family. After consideration of risks, benefits and other options for treatment, the patient has consented to  Procedure(s): ROBOTIC ASSISTED NAVIGATIONAL BRONCHOSCOPY WITH FLUORO (Right) as a surgical intervention.  The patient's history has been reviewed, patient examined, no change in status, stable for surgery.  I have reviewed the patient's chart and labs.  Questions were answered to the patient's satisfaction.     Denson Flake

## 2023-07-26 NOTE — Op Note (Signed)
 Video Bronchoscopy Procedure Note  Date of Operation: 07/26/2023  Pre-op Diagnosis: Right upper lobe and right lower lobe mass  Post-op Diagnosis: Same  Surgeon: Racheal Buddle  Assistants: none  Anesthesia: General anesthesia  Operation: Flexible video fiberoptic bronchoscopy and biopsies.  Estimated Blood Loss: 20-30 cc  Complications: none noted  Indications and History: Loris A. Whitner is 89 y.o. with history of former tobacco use, peripheral vascular disease and cerebrovascular disease, hypertension.  He was recently hospitalized for his cerebrovascular disease, CT chest identified right upper and lower lobe masses.  He underwent antiplatelet therapy with Plavix  and aspirin .  Recommendation was to perform video fiberoptic bronchoscopy with biopsies when safe to temporarily come off his antiplatelet regimen. The risks, benefits, complications, treatment options and expected outcomes were discussed with the patient.  The possibilities of pneumothorax, pneumonia, reaction to medication, pulmonary aspiration, perforation of a viscus, bleeding, failure to diagnose a condition and creating a complication requiring transfusion or operation were discussed with the patient who freely signed the consent.    Description of Procedure: The patient was seen in the Preoperative Area, was examined and was deemed appropriate to proceed.  The patient was taken to [facility] endoscopy room 3, identified as Orlo A. Childrey and the procedure verified as Flexible Video Fiberoptic Bronchoscopy.  A Time Out was held and the above information confirmed.   General anesthesia was initiated the patient was endotracheally intubated. The video fiberoptic bronchoscope was introduced via the ET tube and a general inspection was performed which showed normal trachea, normal main carina.  The left-sided airways were all normal.  There was a hypopigmented friable somewhat vascular endobronchial lesion emanating  from the posterior segment of the right upper lobe.  There was also a friable lobulated vascular endobronchial mass emanating from the superior segment of the right lower lobe and extending to partially obstruct the other right lower lobe segments.  The right middle lobe was clear.  Endobronchial brushings and endobronchial forceps biopsies were performed at both locations, the posterior segment of the right upper lobe and of the right lower lobe mass lesion.  These were sent for cytology and pathology.  1:10,000 dilution epinephrine was injected onto both lesions in divided doses, 8 cc total was used.  There was some mild to moderate oozing from both locations with biopsies that resolved quickly. The patient tolerated the procedure well. The bronchoscope was removed. There were no obvious complications.   Samples: 1. Endobronchial brushings from right upper lobe posterior segment endobronchial lesion 2.  Endobronchial forceps biopsies from right upper lobe posterior segment endobronchial lesion 3.  Endobronchial brushings from right lower lobe endobronchial lesion 4.  Endobronchial forceps biopsies from right lower lobe endobronchial lesion  Plans:  We will review the cytology, pathology results with the patient when they become available.  Outpatient followup will be with Dr Baldwin Levee.    Racheal Buddle, MD, PhD 07/26/2023, 1:07 PM [facility] [phone] or if no answer [phone]

## 2023-07-26 NOTE — Transfer of Care (Signed)
 Immediate Anesthesia Transfer of Care Note  Patient: Stephen Meza  Procedure(s) Performed: VIDEO BRONCHOSCOPY WITHOUT FLUORO BRONCHOSCOPY, WITH BIOPSY BRONCHOSCOPY, WITH BRUSH BIOPSY  Patient Location: PACU  Anesthesia Type:General  Level of Consciousness: awake, alert , and oriented  Airway & Oxygen Therapy: Patient Spontanous Breathing and Patient connected to face mask oxygen  Post-op Assessment: Report given to RN and Post -op Vital signs reviewed and stable  Post vital signs: Reviewed and stable  Last Vitals:  Vitals Value Taken Time  BP    Temp    Pulse    Resp    SpO2      Last Pain:  Vitals:   07/26/23 1033  TempSrc:   PainSc: 0-No pain         Complications: No notable events documented.

## 2023-07-26 NOTE — Anesthesia Preprocedure Evaluation (Signed)
 Anesthesia Evaluation  Patient identified by MRN, date of birth, ID band Patient awake    Reviewed: Allergy & Precautions, H&P , NPO status , Patient's Chart, lab work & pertinent test results  Airway Mallampati: II   Neck ROM: full    Dental   Pulmonary former smoker   breath sounds clear to auscultation       Cardiovascular hypertension, + Peripheral Vascular Disease   Rhythm:regular Rate:Normal     Neuro/Psych CVA    GI/Hepatic ,GERD  ,,  Endo/Other    Renal/GU      Musculoskeletal  (+) Arthritis ,    Abdominal   Peds  Hematology   Anesthesia Other Findings   Reproductive/Obstetrics                             Anesthesia Physical Anesthesia Plan  ASA: 3  Anesthesia Plan: General   Post-op Pain Management:    Induction: Intravenous  PONV Risk Score and Plan: 2 and Ondansetron , Dexamethasone  and Treatment may vary due to age or medical condition  Airway Management Planned: Oral ETT  Additional Equipment:   Intra-op Plan:   Post-operative Plan: Extubation in OR  Informed Consent: I have reviewed the patients History and Physical, chart, labs and discussed the procedure including the risks, benefits and alternatives for the proposed anesthesia with the patient or authorized representative who has indicated his/her understanding and acceptance.     Dental advisory given  Plan Discussed with: CRNA, Anesthesiologist and Surgeon  Anesthesia Plan Comments:        Anesthesia Quick Evaluation

## 2023-07-26 NOTE — Discharge Instructions (Addendum)
 Flexible Bronchoscopy, Care After This sheet gives you information about how to care for yourself after your test. Your doctor may also give you more specific instructions. If you have problems or questions, contact your doctor. Follow these instructions at home: Eating and drinking When you are wide awake, your numbness is gone and your cough and gag reflexes have come back, you may: Start eating only soft foods. Slowly drink liquids. Six hours after the test, go back to your normal diet. Driving Do not drive for 24 hours if you were given a medicine to help you relax (sedative). Do not drive or use heavy machinery while taking prescription pain medicine. General instructions Take over-the-counter and prescription medicines only as told by your doctor. Return to your normal activities as told. Ask what activities are safe for you. Do not use any products that have nicotine or tobacco in them. This includes cigarettes and e-cigarettes. If you need help quitting, ask your doctor. Keep all follow-up visits as told by your doctor. This is important. It is very important if you had a tissue sample (biopsy) taken. Get help right away if: You have shortness of breath that gets worse. You get light-headed. You feel like you are going to pass out (faint). You have chest pain. You cough up: More than a little blood. More blood than before. Summary Do not use cigarettes. Do not use e-cigarettes. Seek care in the Emergency Department right away if you have chest pain or shortness of breath. Call or MyChart Message our office for any questions or problems at [phone].  Okay to restart your Plavix  and your aspirin  on 07/27/2023.   This information is not intended to replace advice given to you by your health care provider. Make sure you discuss any questions you have with your health care provider.

## 2023-07-27 ENCOUNTER — Ambulatory Visit: Admitting: Orthopedic Surgery

## 2023-07-27 ENCOUNTER — Encounter (HOSPITAL_COMMUNITY): Payer: Self-pay | Admitting: Emergency Medicine

## 2023-07-27 ENCOUNTER — Ambulatory Visit: Payer: Self-pay

## 2023-07-27 NOTE — Anesthesia Postprocedure Evaluation (Signed)
 Anesthesia Post Note  Patient: Stephen Meza  Procedure(s) Performed: VIDEO BRONCHOSCOPY WITHOUT FLUORO BRONCHOSCOPY, WITH BIOPSY BRONCHOSCOPY, WITH BRUSH BIOPSY     Patient location during evaluation: PACU Anesthesia Type: General Level of consciousness: awake and alert Pain management: pain level controlled Vital Signs Assessment: post-procedure vital signs reviewed and stable Respiratory status: spontaneous breathing, nonlabored ventilation, respiratory function stable and patient connected to nasal cannula oxygen Cardiovascular status: blood pressure returned to baseline and stable Postop Assessment: no apparent nausea or vomiting Anesthetic complications: no   No notable events documented.  Last Vitals:  Vitals:   07/26/23 1350 07/26/23 1400  BP:  135/76  Pulse: 82 84  Resp: 17 15  Temp:  36.9 C  SpO2: 93% 100%    Last Pain:  Vitals:   07/26/23 1400  TempSrc:   PainSc: 0-No pain                 Carrissa Taitano S

## 2023-07-27 NOTE — Telephone Encounter (Signed)
 Copied from CRM [id number]. Topic: Clinical - Red Word Triage >> Jul 27, 2023  2:29 PM Howard Macho wrote: Reason for CRM: patient wife called stating the patient had a stint put in his shoulder on 3/21. Patient wife stated the patient may need an xray in his shoulder because the wife does not know if a bone is cracked or a muscle pulled. Patient wife states it aches all the time and when he lays down at night. Patient wife states this has been going on since 3/21   Chief Complaint: Shoulder pain  Symptoms: Right shoulder pain Frequency: Constant  Pertinent Negatives: Patient denies any other symptoms  Disposition: [] ED /[] Urgent Care (no appt availability in office) / [x] Appointment(In office/virtual)/ []  [facility]/ [] Home Care/ [] Refused Recommended Disposition /[] [facility]/ []  Follow-up with PCP Additional Notes: Patient's wife called to report that the patient has been experiencing right shoulder pain since he had a stent placed on 3/21. She states his pain is causing him difficulty moving his arm. She states he has no other symptoms at this time. Appointment made for 6/5 for evaluation.    Reason for Disposition  [1] MODERATE pain (e.g., interferes with normal activities) AND [2] present > 3 days  Answer Assessment - Initial Assessment Questions 1. ONSET: "When did the pain start?"     3/21 2. LOCATION: "Where is the pain located?"     Right shoulder  3. PAIN: "How bad is the pain?" (Scale 1-10; or mild, moderate, severe)   - MILD (1-3): doesn't interfere with normal activities   - MODERATE (4-7): interferes with normal activities (e.g., work or school) or awakens from sleep   - SEVERE (8-10): excruciating pain, unable to do any normal activities, unable to move arm at all due to pain     Mild to moderate   4. WORK OR EXERCISE: "Has there been any recent work or exercise that involved this part of the body?"     No 5. CAUSE: "What do you think is causing the  shoulder pain?"     Believes due to stent placement on the right side  6. OTHER SYMPTOMS: "Do you have any other symptoms?" (e.g., neck pain, swelling, rash, fever, numbness, weakness)     No  Protocols used: Shoulder Pain-A-AH

## 2023-07-28 ENCOUNTER — Other Ambulatory Visit: Payer: Self-pay | Admitting: Family Medicine

## 2023-07-28 DIAGNOSIS — M1990 Unspecified osteoarthritis, unspecified site: Secondary | ICD-10-CM | POA: Diagnosis not present

## 2023-07-28 DIAGNOSIS — Z9181 History of falling: Secondary | ICD-10-CM | POA: Diagnosis not present

## 2023-07-28 DIAGNOSIS — I69354 Hemiplegia and hemiparesis following cerebral infarction affecting left non-dominant side: Secondary | ICD-10-CM | POA: Diagnosis not present

## 2023-07-28 DIAGNOSIS — M4802 Spinal stenosis, cervical region: Secondary | ICD-10-CM | POA: Diagnosis not present

## 2023-07-28 DIAGNOSIS — I499 Cardiac arrhythmia, unspecified: Secondary | ICD-10-CM

## 2023-07-28 DIAGNOSIS — I10 Essential (primary) hypertension: Secondary | ICD-10-CM | POA: Diagnosis not present

## 2023-07-28 DIAGNOSIS — G720 Drug-induced myopathy: Secondary | ICD-10-CM | POA: Diagnosis not present

## 2023-07-28 DIAGNOSIS — Z7902 Long term (current) use of antithrombotics/antiplatelets: Secondary | ICD-10-CM | POA: Diagnosis not present

## 2023-07-28 DIAGNOSIS — D519 Vitamin B12 deficiency anemia, unspecified: Secondary | ICD-10-CM | POA: Diagnosis not present

## 2023-07-28 DIAGNOSIS — R911 Solitary pulmonary nodule: Secondary | ICD-10-CM | POA: Diagnosis not present

## 2023-07-28 DIAGNOSIS — M109 Gout, unspecified: Secondary | ICD-10-CM | POA: Diagnosis not present

## 2023-07-28 DIAGNOSIS — Z8551 Personal history of malignant neoplasm of bladder: Secondary | ICD-10-CM | POA: Diagnosis not present

## 2023-07-28 LAB — CYTOLOGY - NON PAP

## 2023-07-29 ENCOUNTER — Encounter: Payer: Self-pay | Admitting: Family Medicine

## 2023-07-29 ENCOUNTER — Ambulatory Visit: Admitting: Family Medicine

## 2023-07-29 ENCOUNTER — Ambulatory Visit (INDEPENDENT_AMBULATORY_CARE_PROVIDER_SITE_OTHER)

## 2023-07-29 ENCOUNTER — Ambulatory Visit: Payer: Self-pay | Admitting: Family Medicine

## 2023-07-29 VITALS — BP 128/70 | HR 94 | Temp 97.6°F | Ht 72.0 in | Wt 155.0 lb

## 2023-07-29 DIAGNOSIS — M4802 Spinal stenosis, cervical region: Secondary | ICD-10-CM | POA: Diagnosis not present

## 2023-07-29 DIAGNOSIS — M25511 Pain in right shoulder: Secondary | ICD-10-CM | POA: Diagnosis not present

## 2023-07-29 DIAGNOSIS — I499 Cardiac arrhythmia, unspecified: Secondary | ICD-10-CM | POA: Diagnosis not present

## 2023-07-29 DIAGNOSIS — I10 Essential (primary) hypertension: Secondary | ICD-10-CM | POA: Diagnosis not present

## 2023-07-29 DIAGNOSIS — G8929 Other chronic pain: Secondary | ICD-10-CM

## 2023-07-29 DIAGNOSIS — Z9181 History of falling: Secondary | ICD-10-CM | POA: Diagnosis not present

## 2023-07-29 DIAGNOSIS — M1990 Unspecified osteoarthritis, unspecified site: Secondary | ICD-10-CM | POA: Diagnosis not present

## 2023-07-29 DIAGNOSIS — G720 Drug-induced myopathy: Secondary | ICD-10-CM | POA: Diagnosis not present

## 2023-07-29 DIAGNOSIS — Z7902 Long term (current) use of antithrombotics/antiplatelets: Secondary | ICD-10-CM | POA: Diagnosis not present

## 2023-07-29 DIAGNOSIS — R531 Weakness: Secondary | ICD-10-CM

## 2023-07-29 DIAGNOSIS — D519 Vitamin B12 deficiency anemia, unspecified: Secondary | ICD-10-CM | POA: Diagnosis not present

## 2023-07-29 DIAGNOSIS — R911 Solitary pulmonary nodule: Secondary | ICD-10-CM | POA: Diagnosis not present

## 2023-07-29 DIAGNOSIS — M109 Gout, unspecified: Secondary | ICD-10-CM | POA: Diagnosis not present

## 2023-07-29 DIAGNOSIS — M19011 Primary osteoarthritis, right shoulder: Secondary | ICD-10-CM | POA: Diagnosis not present

## 2023-07-29 DIAGNOSIS — R634 Abnormal weight loss: Secondary | ICD-10-CM

## 2023-07-29 DIAGNOSIS — Z8551 Personal history of malignant neoplasm of bladder: Secondary | ICD-10-CM | POA: Diagnosis not present

## 2023-07-29 DIAGNOSIS — I69354 Hemiplegia and hemiparesis following cerebral infarction affecting left non-dominant side: Secondary | ICD-10-CM | POA: Diagnosis not present

## 2023-07-29 DIAGNOSIS — Z7409 Other reduced mobility: Secondary | ICD-10-CM

## 2023-07-29 NOTE — Assessment & Plan Note (Addendum)
 XR R shoulder today If neg, will send order for [facility] for PT  May take tylenol  650mg  prn

## 2023-07-29 NOTE — Patient Instructions (Addendum)
 May take tylenol  for shoulder pain, 650mg  at a time.  We are getting an xray today.   We will be in contact with any abnormal results that require further attention.  We will follow up afterward.  May need to order home PT   Continue current home medication regimen

## 2023-07-29 NOTE — Progress Notes (Signed)
 Acute Office Visit  Subjective:     Patient ID: Stephen Meza, male    DOB: 1933-05-26, 89 y.o.   MRN: [id number]  Chief Complaint  Patient presents with   Shoulder Pain    Right shoulder since march when stent was put in now can't raise arm. Hurt while sleeping can't get comfortable.    HPI Patient is in today for evaluation of right elbow pain since May 14, 2023 when he was in the hospital post CVA and post right carotid endarterectomy. Has been having to use a walker since this inpatient stay. He is accompanied by his wife who is acting as historian today. Reports limited range of motion, stiffness and tightness in the right shoulder joint. Has tried Voltaren  gel, ice with no relief. Has not taken any medications for this, does take baby aspirin  once a day.  Has lost 5 pounds in the last 3 days since bronchoscopy.  States that they are trying to do protein shakes and encouraging him to eat, he just does not have an appetite.  Had bronchoscopy on 07/29/23. Sees pulm next week.  Denies other concerns today  ROS Per HPI      Objective:    BP 128/70   Pulse 94   Temp 97.6 F (36.4 C) (Temporal)   Ht 6' (1.829 m)   Wt 155 lb (70.3 kg)   SpO2 96%   BMI 21.02 kg/m    Physical Exam Vitals and nursing note reviewed.  Constitutional:      General: He is not in acute distress.    Comments: Chronically ill appearing  HENT:     Head: Normocephalic and atraumatic.     Right Ear: External ear normal.     Left Ear: External ear normal.     Nose: Nose normal.     Mouth/Throat:     Mouth: Mucous membranes are moist.     Pharynx: Oropharynx is clear.  Eyes:     Extraocular Movements: Extraocular movements intact.  Neck:     Vascular: No carotid bruit.  Cardiovascular:     Rate and Rhythm: Normal rate and regular rhythm.     Pulses: Normal pulses.     Heart sounds: Normal heart sounds.  Pulmonary:     Effort: Pulmonary effort is normal. No respiratory  distress.     Breath sounds: Wheezing present. No rhonchi or rales.     Comments: Diffuse wheezing throughout bilateral lobes Musculoskeletal:        General: Tenderness (LROM to R shoulder, deltoid tenderness) present.     Cervical back: Normal range of motion.     Right lower leg: No edema.     Left lower leg: No edema.     Comments: Generalized muscle weakness.  Grip strengths equal bilaterally, using walker well.  LROM to R shoulder with abduction.   Lymphadenopathy:     Cervical: No cervical adenopathy.  Skin:    General: Skin is warm and dry.  Neurological:     General: No focal deficit present.     Mental Status: He is alert and oriented to person, place, and time.     Sensory: Sensory deficit present.     Motor: Weakness present.     Coordination: Coordination abnormal.     Gait: Gait abnormal.     Comments: Using walker today, moving slowly. Unsteady gait.   Psychiatric:        Mood and Affect: Mood normal.  Behavior: Behavior normal.     No results found for any visits on 07/29/23.      Assessment & Plan:   Chronic right shoulder pain Assessment & Plan: XR R shoulder today If neg, will send order for [facility] for PT  May take tylenol  650mg  prn  Orders: -     DG Shoulder Right; Future  Weight loss Assessment & Plan: Likely related to lung mass, chronic anemia Will see pulm again on 08/03/23 for plan for lung mass      No orders of the defined types were placed in this encounter.   Return for as scheduled.  Wellington Half, FNP

## 2023-07-29 NOTE — Assessment & Plan Note (Signed)
 Likely related to lung mass, chronic anemia Will see pulm again on 08/03/23 for plan for lung mass

## 2023-07-30 ENCOUNTER — Telehealth: Payer: Self-pay | Admitting: Family Medicine

## 2023-07-30 ENCOUNTER — Other Ambulatory Visit: Payer: Self-pay

## 2023-07-30 DIAGNOSIS — R911 Solitary pulmonary nodule: Secondary | ICD-10-CM | POA: Diagnosis not present

## 2023-07-30 DIAGNOSIS — G720 Drug-induced myopathy: Secondary | ICD-10-CM | POA: Diagnosis not present

## 2023-07-30 DIAGNOSIS — M4802 Spinal stenosis, cervical region: Secondary | ICD-10-CM | POA: Diagnosis not present

## 2023-07-30 DIAGNOSIS — M109 Gout, unspecified: Secondary | ICD-10-CM | POA: Diagnosis not present

## 2023-07-30 DIAGNOSIS — G8929 Other chronic pain: Secondary | ICD-10-CM

## 2023-07-30 DIAGNOSIS — M1990 Unspecified osteoarthritis, unspecified site: Secondary | ICD-10-CM | POA: Diagnosis not present

## 2023-07-30 DIAGNOSIS — I499 Cardiac arrhythmia, unspecified: Secondary | ICD-10-CM | POA: Diagnosis not present

## 2023-07-30 DIAGNOSIS — I69354 Hemiplegia and hemiparesis following cerebral infarction affecting left non-dominant side: Secondary | ICD-10-CM | POA: Diagnosis not present

## 2023-07-30 DIAGNOSIS — Z9181 History of falling: Secondary | ICD-10-CM | POA: Diagnosis not present

## 2023-07-30 DIAGNOSIS — I10 Essential (primary) hypertension: Secondary | ICD-10-CM | POA: Diagnosis not present

## 2023-07-30 DIAGNOSIS — Z7902 Long term (current) use of antithrombotics/antiplatelets: Secondary | ICD-10-CM | POA: Diagnosis not present

## 2023-07-30 DIAGNOSIS — Z8551 Personal history of malignant neoplasm of bladder: Secondary | ICD-10-CM | POA: Diagnosis not present

## 2023-07-30 DIAGNOSIS — D519 Vitamin B12 deficiency anemia, unspecified: Secondary | ICD-10-CM | POA: Diagnosis not present

## 2023-07-30 NOTE — Telephone Encounter (Signed)
 Copied from CRM [id number]. Topic: Clinical - Lab/Test Results >> Jul 29, 2023  4:21 PM Felizardo Hotter wrote: Reason for CRM: Pt's wife Cary Clarks has a follow up question for Christa Course, CMA regarding DG Shoulder Right (Accession [id number]) (Order [id number]). Please call her at [phone].

## 2023-07-30 NOTE — Telephone Encounter (Signed)
LVM for patient's wife to return call. 

## 2023-07-30 NOTE — Telephone Encounter (Signed)
 Spoke with patients wife, she requested to come back in on Tuesday to see [place]. I scheduled patient, although Trevor Fudge states there is not much different we can do. Patients wife also was agreeable to us  referring him to sports medicine. I called their Daughter, to keep her in the loop on this as well. She will call back to cancel patients Tuesday appointment if wife feels he does not need it after all.

## 2023-08-02 DIAGNOSIS — Z7902 Long term (current) use of antithrombotics/antiplatelets: Secondary | ICD-10-CM | POA: Diagnosis not present

## 2023-08-02 DIAGNOSIS — M4802 Spinal stenosis, cervical region: Secondary | ICD-10-CM | POA: Diagnosis not present

## 2023-08-02 DIAGNOSIS — D519 Vitamin B12 deficiency anemia, unspecified: Secondary | ICD-10-CM | POA: Diagnosis not present

## 2023-08-02 DIAGNOSIS — I499 Cardiac arrhythmia, unspecified: Secondary | ICD-10-CM | POA: Diagnosis not present

## 2023-08-02 DIAGNOSIS — M1990 Unspecified osteoarthritis, unspecified site: Secondary | ICD-10-CM | POA: Diagnosis not present

## 2023-08-02 DIAGNOSIS — G720 Drug-induced myopathy: Secondary | ICD-10-CM | POA: Diagnosis not present

## 2023-08-02 DIAGNOSIS — Z9181 History of falling: Secondary | ICD-10-CM | POA: Diagnosis not present

## 2023-08-02 DIAGNOSIS — I69354 Hemiplegia and hemiparesis following cerebral infarction affecting left non-dominant side: Secondary | ICD-10-CM | POA: Diagnosis not present

## 2023-08-02 DIAGNOSIS — M109 Gout, unspecified: Secondary | ICD-10-CM | POA: Diagnosis not present

## 2023-08-02 DIAGNOSIS — I10 Essential (primary) hypertension: Secondary | ICD-10-CM | POA: Diagnosis not present

## 2023-08-02 DIAGNOSIS — R911 Solitary pulmonary nodule: Secondary | ICD-10-CM | POA: Diagnosis not present

## 2023-08-02 DIAGNOSIS — Z8551 Personal history of malignant neoplasm of bladder: Secondary | ICD-10-CM | POA: Diagnosis not present

## 2023-08-02 NOTE — Progress Notes (Unsigned)
    Stephen Meza D.[facility] [address] [place] [address] Phone: [phone]   Assessment and Plan:     There are no diagnoses linked to this encounter.  ***   Pertinent previous records reviewed include ***    Follow Up: ***     Subjective:   I, Stephen Meza, am serving as a Neurosurgeon for Doctor Stephen Meza  Chief Complaint: right shoulder pain   HPI:   08/03/2023 Patient is a 89 year old male with right shoulder pain. Patient states  Relevant Historical Information: ***  Additional pertinent review of systems negative.   Current Outpatient Medications:    allopurinol  (ZYLOPRIM ) 100 MG tablet, TAKE 1 TABLET BY MOUTH EVERY DAY, Disp: 90 tablet, Rfl: 4   aspirin  EC 81 MG tablet, Take 1 tablet (81 mg total) by mouth daily. Okay to restart on 07/27/2023.  Swallow whole., Disp: , Rfl:    benzonatate  (TESSALON ) 100 MG capsule, TAKE 1 CAPSULE (100 MG TOTAL) BY MOUTH EVERY 6 (SIX) HOURS AS NEEDED FOR COUGH, Disp: 30 capsule, Rfl: 0   carvedilol  (COREG ) 3.125 MG tablet, TAKE 1 TABLET BY MOUTH TWICE A DAY WITH A MEAL, Disp: 180 tablet, Rfl: 1   clopidogrel  (PLAVIX ) 75 MG tablet, Take 1 tablet (75 mg total) by mouth daily. Okay to restart on 07/27/2023, Disp: , Rfl:    Cyanocobalamin  (B-12) 3000 MCG CAPS, Take 3,000 mcg by mouth daily., Disp: , Rfl:    diclofenac  Sodium (VOLTAREN ) 1 % GEL, Apply 4 g topically 4 (four) times daily., Disp: 150 g, Rfl: 3   ezetimibe  (ZETIA ) 10 MG tablet, TAKE 1 TABLET BY MOUTH EVERY DAY, Disp: 90 tablet, Rfl: 1   omeprazole  (PRILOSEC) 40 MG capsule, TAKE 1 CAPSULE BY MOUTH EVERY DAY, Disp: 90 capsule, Rfl: 2   polyethylene glycol (MIRALAX ) 17 g packet, Take 17 g by mouth daily as needed for moderate constipation or severe constipation (not helped with stool softner)., Disp: 10 each, Rfl: 0   Objective:     There were no vitals filed for this visit.    There is no height or weight on file to calculate BMI.     Physical Exam:    ***   Electronically signed by:  Marshall Skeeter D.[facility] 7:39 AM 08/02/23

## 2023-08-03 ENCOUNTER — Ambulatory Visit: Admitting: Sports Medicine

## 2023-08-03 ENCOUNTER — Encounter: Payer: Self-pay | Admitting: Acute Care

## 2023-08-03 ENCOUNTER — Ambulatory Visit: Admitting: Family Medicine

## 2023-08-03 ENCOUNTER — Ambulatory Visit: Admitting: Acute Care

## 2023-08-03 VITALS — BP 131/83 | HR 94 | Ht 72.0 in | Wt 155.0 lb

## 2023-08-03 VITALS — BP 120/70 | HR 101 | Ht 72.0 in

## 2023-08-03 DIAGNOSIS — M25511 Pain in right shoulder: Secondary | ICD-10-CM | POA: Diagnosis not present

## 2023-08-03 DIAGNOSIS — R051 Acute cough: Secondary | ICD-10-CM | POA: Diagnosis not present

## 2023-08-03 DIAGNOSIS — K5909 Other constipation: Secondary | ICD-10-CM

## 2023-08-03 DIAGNOSIS — R0609 Other forms of dyspnea: Secondary | ICD-10-CM | POA: Diagnosis not present

## 2023-08-03 DIAGNOSIS — Z9889 Other specified postprocedural states: Secondary | ICD-10-CM

## 2023-08-03 DIAGNOSIS — C3481 Malignant neoplasm of overlapping sites of right bronchus and lung: Secondary | ICD-10-CM | POA: Diagnosis not present

## 2023-08-03 DIAGNOSIS — Z87891 Personal history of nicotine dependence: Secondary | ICD-10-CM

## 2023-08-03 DIAGNOSIS — G8929 Other chronic pain: Secondary | ICD-10-CM | POA: Diagnosis not present

## 2023-08-03 DIAGNOSIS — M19011 Primary osteoarthritis, right shoulder: Secondary | ICD-10-CM

## 2023-08-03 DIAGNOSIS — M7541 Impingement syndrome of right shoulder: Secondary | ICD-10-CM | POA: Diagnosis not present

## 2023-08-03 DIAGNOSIS — D649 Anemia, unspecified: Secondary | ICD-10-CM | POA: Diagnosis not present

## 2023-08-03 MED ORDER — ALBUTEROL SULFATE HFA 108 (90 BASE) MCG/ACT IN AERS: INHALATION_SPRAY | RESPIRATORY_TRACT | 2 refills | Status: DC

## 2023-08-03 NOTE — Patient Instructions (Signed)
 Shoulder HEP  Tylenol  [id number] mg 2-3 times a day for pain relief  4 week follow up

## 2023-08-03 NOTE — Progress Notes (Signed)
 History of Present Illness Stephen Meza is a 89 y.o. male former smoker with history of bladder cancer referred 03/2023 for a lung mass. He will be followed by Dr. Baldwin Levee.   Synopsis Patient previously seen by [facility] 04/26/2023 for evaluation of a lung nodule 04/12/2023, incidentally discovered when a CT Angio done 02/2023 to evaluate for pneumonia. He was treated with antibiotics for his pneumonia , pt stated the medication  resolved his symptoms.He denied  any further fever or purulent secretions.  He had follow up CT chest  03/26/2023, which  revealed a 6.3 by 5.2 cm right parahilar mass, so slightly smaller on follow up,  but the nodule persisted.  There is also suspicion of lymph node involvement.  PET scan was done 04/22/2023 to better evaluate the lung nodule.  It showed an intensely hypermetabolic right hilar mass that was consistent with a primary bronchogenic carcinoma.  The mass spanned from the right upper lobe to the right lower lobe constricting the right lower lobe bronchus.  There was also notation of hypermetabolic right lower paratracheal and subcarinal lymph nodes that is consistent with nodal metastasis.  There was no other evidence of distant metastatic disease.   Patient has had weight loss at the March 3 office visit.  He had reported losing approximately 12 pounds over the past year.  At that time he denied any hemoptysis.  He did confirm dyspnea with exertion.  Patient quit smoking approximately 30 years ago.   Patient was scheduled for bronchoscopy with biopsies to further evaluate the nodule on 05/10/2023.  However before the patient to get procedure done he presented to the emergency room on 05/09/2023 with symptoms of stroke.  MRI brain done 05/09/2023 showed multifocal acute infarcts within the right MCA and PCA territories including the right precentral gyrus.  Additionally there was attenuated right ICA flow void at the skull base likely related to stenosis.   There was abnormal right vertebral artery V2 segment flow void consistent with slow flow or occlusion.  In addition to a distal right vertebral artery flow void.  There is also notation of mild spinal canal stenosis and severe right neural foraminal stenosis at C4-C5.  There was moderate right and severe left C5-6 and C6-7 neural for aminal stenosis noted also.   Patient was in the hospital for a total of 6 days.  He had a right transcarotid artery revascularization-10x40 mm stent  for Symptomatic right internal carotid artery stenosis that is most likely the etiology of his stroke on May 14, 2023.  He was discharged home May 15, 2023.  Patient is currently on dual antiplatelet therapy.  Patient and family understand that we cannot move forward with bronchoscopy with biopsies until it is safe for him to come off his dual antiplatelet therapy. Patient states he has been weak since the procedure.  He is receiving in-home physical therapy that is helping him regain his strength.   Patient developed worsening shortness of breath with purulent secretions and cough since released from the hospital..  Patient has been brought into the office for evaluation of this significant finding after his stroke and recent revascularization procedure.   Of note patient has follow-up with vascular surgery on Jul 08, 2023.  At that time he will have an ultrasound of this digit vessel to make sure patency.  Patient will be on dual platelet therapy until this visit.  We will reach out to the vascular surgeon to see if he feels the patient  will be able to come off dual platelet therapy anytime soon for bronchoscopy with biopsies of the lung nodule.  He was given the ok to come off Plavix  07/08/2023 , but not ASA for 48 hours to move forward with the bronchoscopy with biopsies of the right hilar mass noted on CT imaging. He was scheduled for bronchoscopy with biopsies on 07/26/2023. He is here today with his family to review the  results of the cytology and ensure he has done well post procedure.   Pt. Has consented to use of [employer] soft wear to help capture the content of this OV .     08/03/2023 Stephen Meza is an 89 year old male with former smoker  who presents for follow-up after a  bronchoscopy with biopsy. He is accompanied by his family members.  Per his family, he did well after the procedure. No bleeding, no s/s of infection, no worsening dyspnea or adverse reaction to anesthesia.   We reviewed the results of the cytology. 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 A PET scan indicated lymph node involvement, though these nodes were not biopsied. A recent biopsy involved four samples from the right lung lobes.  He experiences a persistent cough with a 'rattle' and occasional sore throat, both of which have improved over time. No fever, significant bleeding, or signs of infection post-procedure. Currently, there is no shortness of breath.  He has significant shoulder pain, which began after a neck operation in March 2016. The pain is localized to the shoulder joint without radiation down the arm. An x-ray showed no fractures, and a PET scan showed no skeletal metastasis.He has follow up for this with sports medicine today.  He has a history of low hemoglobin levels, with a recent measurement of 11.4. He is not taking iron  supplements and experiences fatigue and constipation.  He experiences increased wheezing and shortness of breath, particularly with exertion, such as walking from the car to a building. His oxygen saturation drops to 93% after activity, and he feels winded easily.I will do PFT's and a walk test after patient gets treatment for his lung cancer. I asked Mrs. Bonkmeyer to call the office to get this scheduled when they are ready. She is concerned about their frequent appointments.  I did go ahead and order an albuterol inhaler to use as needed,  and provided a spacer to use with this. She is to call for his follow up.   I have made referral to both radiation oncology and medical oncology. He has already had PET scan and MR Brain for staging.  Mr Simone Dubois has a wonderful and supportive family. I hope he does well with treatment. It has been a pleasure caring for him.     Test Results: FINAL MICROSCOPIC DIAGNOSIS:  A.  RIGHT LUNG, UPPER LOBE, MASS, BRUSHING:  - Malignant  - Squamous cell carcinoma   B.  RIGHT LUNG, UPPER LOBE, MASS, BIOPSY:  - Malignant  - Squamous cell carcinoma   C.  RIGHT LUNG, LOWER LOBE, MASS, BRUSHING:  - Malignant  - Squamous cell carcinoma   D.  RIGHT LUNG, LOWER LOBE, MASS, BIOPSY:  - Malignant  - Squamous cell carcinoma   CXR 06/04/2023 Normal cardiac size. Large right perihilar mass is noted consistent with malignancy. Mild left basilar atelectasis or infiltrate is noted. Increased right basilar opacity is noted concerning for worsening atelectasis or possibly pneumonia. Bony thorax is unremarkable.  IMPRESSION: Large right perihilar mass is again noted consistent with malignancy. Bibasilar atelectasis or infiltrates are noted.  MRI Brain 05/09/2923 Brain: Multifocal abnormal diffusion restriction within the right MCA and PCA territories, including the right precentral gyrus. Fewer than 5 scattered chronic microhemorrhages in a nonspecific pattern. There is multifocal hyperintense T2-weighted signal within the white matter. Parenchymal volume and CSF spaces are normal. Old left cerebellar infarct. The midline structures are normal. There is no abnormal contrast enhancement.   Multifocal acute infarcts within the right MCA and PCA territories, including the right precentral gyrus. 2. Attenuated right ICA flow void at the skull base, likely stenosis. 3. Abnormal right vertebral artery V2 segment flow void, consistent with slow flow or occlusion. The distal right vertebral artery  flow void is maintained. 4. Mild spinal canal stenosis and severe right neural foraminal stenosis at C4-5. 5. Moderate right and severe left C5-6 and C6-7 neural foraminal stenosis.    PET scan 04/22/2023 Intensely hypermetabolic RIGHT hilar mass consistent with primary bronchogenic carcinoma. Mass spans the RIGHT upper lobe and RIGHT lower lobe and constricts the RIGHT lower lobe bronchus 2. Hypermetabolic RIGHT lower paratracheal and subcarinal nodes consistent with nodal metastasis. 3. No evidence of distant metastatic disease. 4. Benign LEFT adrenal adenoma. 5.  Aortic Atherosclerosis (ICD10-I70.0).     03/26/2023 CT Chest with Contrast Thyroid  gland and esophagus are unremarkable. 12 mm precarinal lymph node is noted consistent with malignancy. 12 mm subcarinal lymph node is noted consistent with malignancy.   Lungs/Pleura: No pneumothorax or pleural effusion is noted. Grossly stable 6.3 x 5.2 cm right perihilar mass is noted which extends into the right upper and lower lobes, most consistent with malignancy. Some degree of postobstructive atelectasis or pneumonia is noted in right lower lobe. Scarring is noted throughout both lungs peripherally. 6.3 x 5.2 cm right perihilar mass is noted which extends into right upper and lower lobes, most consistent with malignancy. Biopsy via bronchoscopy is recommended. Mildly enlarged carinal and subcarinal lymph nodes are noted concerning for metastatic disease. Some degree of postobstructive atelectasis or pneumonia may be present in the right lower lobe.     CTA 03/16/2023 Masslike consolidation in the central right lower lobe extending from the right hilum into the superior segment of the right lower lobe, measuring 7.9 x 5.5 cm. While this could reflect pneumonia, appearance is concerning for tumor/malignancy. Areas of peripheral fibrosis in the lungs bilaterally.  Masslike opacity in the right hilum and central right lower lobe could  reflect pneumonia, but appearance is concerning for malignancy. Followup CT is recommended in 3-4 weeks following trial of antibiotic therapy to assess for resolution.  Areas of peripheral fibrosis throughout the lungs bilaterally.      Latest Ref Rng & Units 07/26/2023   10:45 AM 05/21/2023    2:48 PM 05/15/2023    5:10 AM  CBC  WBC 4.0 - 10.5 K/uL 7.6  8.7  7.9   Hemoglobin 13.0 - 17.0 g/dL 95.6  38.7  9.6   Hematocrit 39.0 - 52.0 % 36.8  36.2  30.3   Platelets 150 - 400 K/uL 253  338.0  217        Latest Ref Rng & Units 07/26/2023   10:45 AM 05/21/2023    2:48 PM 05/15/2023    5:10 AM  BMP  Glucose 70 - 99 mg/dL 564  332  951   BUN 8 - 23 mg/dL 15  20  16    Creatinine 0.61 - 1.24 mg/dL 8.84  0.81  0.96   Sodium 135 - 145 mmol/L 135  136  135   Potassium 3.5 - 5.1 mmol/L 4.5  4.7  3.7   Chloride 98 - 111 mmol/L 101  99  102   CO2 22 - 32 mmol/L 25  30  24    Calcium  8.9 - 10.3 mg/dL 9.1  86.5  8.8     BNP No results found for: "BNP"  ProBNP No results found for: "PROBNP"  PFT No results found for: "FEV1PRE", "FEV1POST", "FVCPRE", "FVCPOST", "TLC", "DLCOUNC", "PREFEV1FVCRT", "PSTFEV1FVCRT"  DG Shoulder Right Result Date: 07/29/2023 CLINICAL DATA:  Right shoulder pain for several months. EXAM: RIGHT SHOULDER - 2+ VIEW COMPARISON:  None Available. FINDINGS: There is no evidence of fracture or dislocation. Minimal degenerative changes seen involving the right glenohumeral joint. Soft tissues are unremarkable. IMPRESSION: Minimal osteoarthritis of right glenohumeral joint. No acute abnormality seen. Electronically Signed   By: Rosalene Colon M.D.   On: 07/29/2023 14:50   DG C-Arm 1-60 Min-No Report Result Date: 07/26/2023 Fluoroscopy was utilized by the requesting physician.  No radiographic interpretation.   CT Super D Chest Wo Contrast Result Date: 07/24/2023 CLINICAL DATA:  Lung nodule follow-up. EXAM: CT CHEST WITHOUT CONTRAST TECHNIQUE: Multidetector CT imaging of the chest was  performed using thin slice collimation for electromagnetic bronchoscopy planning purposes, without intravenous contrast. RADIATION DOSE REDUCTION: This exam was performed according to the departmental dose-optimization program which includes automated exposure control, adjustment of the mA and/or kV according to patient size and/or use of iterative reconstruction technique. COMPARISON:  03/26/2023, 13120.5. FINDINGS: Cardiovascular: The heart is mildly enlarged and there is no pericardial effusion. Multi-vessel coronary artery calcifications are noted. There is atherosclerotic calcification of the aorta with aneurysmal dilatation of the ascending aorta measuring 4.0 cm. The pulmonary trunk is mildly distended which may be associated with underlying pulmonary artery hypertension. Mediastinum/Nodes: Enlarged lymph nodes are present in the mediastinum. There is a 1.3 cm left node in the precarinal space. There is a 1.4 cm lymph node in the subcarinal space. No axillary lymphadenopathy. Evaluation of the hila is limited due to lack of IV contrast. Lungs/Pleura: Paraseptal and centrilobular emphysematous changes are present in the lungs. There has been interval enlargement of a right hilar mass measuring 8.7 x 6.8 cm. Reticular opacities with subpleural honeycombing is present bilaterally and increased in the right upper robe as compared to the prior exam. No effusion or pneumothorax is seen. Upper Abdomen: There is a stable left adrenal nodule measuring 1.6 cm, previously characterized is at an. No acute abnormality. Musculoskeletal: Degenerative changes are present in the thoracic spine. A stable compression remitting is noted the superior endplate at L1. No acute or suspicious osseous abnormality is seen. IMPRESSION: 1. Interval enlargement of right hilar mass now measuring 8.7 x 6.8 cm. 2. Slightly increased size of mediastinal lymph nodes, compatible with known metastatic disease. 3. Emphysema with fibrotic changes  in the lungs bilaterally, increased in the right upper lobe since the prior exam. 4. Coronary artery calcifications. 5. 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 Recommend annual imaging followup by CTA or MRA. This recommendation follows 2010 ACCF/AHA/AATS/ACR/ASA/SCA/SCAI/SIR/STS/SVM Guidelines for the Diagnosis and Management of Patients with Thoracic Aortic Disease. Circulation. 2010; 121: H846-N629. Aortic aneurysm NOS (ICD10-I71.9) Electronically Signed   By: Wyvonnia Heimlich M.D.   On: 07/24/2023 20:05   VAS US  CAROTID Result Date: 07/08/2023 Carotid Arterial Duplex Study Patient Name:  Stephen Meza  Date  of Exam:   07/08/2023 Medical Rec #: [id number]             Accession #:    [id number] Date of Birth: 06-08-1933             Patient Gender: M Patient Age:   29 years Exam Location:  [address] Procedure:      VAS US  CAROTID Referring Phys: Melodie Spry ROBINS --------------------------------------------------------------------------------  Indications:   Carotid artery disease and Right stent. Risk Factors:  Hypertension, hyperlipidemia, past history of smoking. Other Factors: 05/14/23                Right transcarotid artery revascularization_10x40 mm stent. Performing Technologist: Jenifer Miu RVT  Examination Guidelines: A complete evaluation includes B-mode imaging, spectral Doppler, color Doppler, and power Doppler as needed of all accessible portions of each vessel. Bilateral testing is considered an integral part of a complete examination. Limited examinations for reoccurring indications may be performed as noted.  Right Carotid Findings: +----------+--------+--------+--------+------------------+--------+           PSV cm/sEDV cm/sStenosisPlaque DescriptionComments +----------+--------+--------+--------+------------------+--------+ CCA Prox  49      5                                           +----------+--------+--------+--------+------------------+--------+ CCA Distal32      10                                stent    +----------+--------+--------+--------+------------------+--------+ ICA Prox  20      6                                 stent    +----------+--------+--------+--------+------------------+--------+ ICA Mid   26      6                                 stent    +----------+--------+--------+--------+------------------+--------+ ICA Distal47      13                                         +----------+--------+--------+--------+------------------+--------+ ECA       64      0                                          +----------+--------+--------+--------+------------------+--------+ +----------+--------+-------+----------------+-------------------+           PSV cm/sEDV cmsDescribe        Arm Pressure (mmHG) +----------+--------+-------+----------------+-------------------+ Subclavian72      2      Multiphasic, [id number]                 +----------+--------+-------+----------------+-------------------+ +---------+--------+-+--------+-+---------+ VertebralPSV cm/s6EDV cm/s2Antegrade +---------+--------+-+--------+-+---------+ Right Stent(s): +---------------+--------+--------+--------+--------+--------+ CCA-ICA        PSV cm/sEDV cm/sStenosisWaveformComments +---------------+--------+--------+--------+--------+--------+ Prox to Stent  32      9                                +---------------+--------+--------+--------+--------+--------+  Proximal Stent 20      6                                +---------------+--------+--------+--------+--------+--------+ Mid Stent      25      6                                +---------------+--------+--------+--------+--------+--------+ Distal Stent   40      11                               +---------------+--------+--------+--------+--------+--------+ Distal to Stent47       13                               +---------------+--------+--------+--------+--------+--------+  Left Carotid Findings: +----------+--------+--------+--------+------------------+--------+           PSV cm/sEDV cm/sStenosisPlaque DescriptionComments +----------+--------+--------+--------+------------------+--------+ CCA Prox  44      8                                          +----------+--------+--------+--------+------------------+--------+ CCA Distal38      8                                          +----------+--------+--------+--------+------------------+--------+ ICA Prox  66      15                                         +----------+--------+--------+--------+------------------+--------+ ICA Mid   58      17                                         +----------+--------+--------+--------+------------------+--------+ ICA Distal60      17                                         +----------+--------+--------+--------+------------------+--------+ ECA       39      6                                          +----------+--------+--------+--------+------------------+--------+ +----------+--------+--------+----------------+-------------------+           PSV cm/sEDV cm/sDescribe        Arm Pressure (mmHG) +----------+--------+--------+----------------+-------------------+ Subclavian51      0       Multiphasic, [id number]                 +----------+--------+--------+----------------+-------------------+ +---------+--------+--+--------+-+---------+ VertebralPSV cm/s31EDV cm/s9Antegrade +---------+--------+--+--------+-+---------+  Summary: Right         Patent right CCA-ICA stent with no evidence of significant Carotid:      restenosis. Left Carotid: Velocities in the left ICA are consistent with a  1-39% stenosis. Vertebrals:  Bilateral vertebral arteries demonstrate antegrade flow. Subclavians: Normal flow hemodynamics were seen in bilateral subclavian               arteries. *See table(s) above for measurements and observations.  Electronically signed by Irvin Mantel on 07/08/2023 at 11:22:56 AM.    Final      Past medical hx Past Medical History:  Diagnosis Date   DUODENITIS, WITHOUT HEMORRHAGE 07/14/2007   ERECTILE DYSFUNCTION 07/14/2007   Esophageal stricture 03/19/2016   ESOPHAGITIS 07/14/2007   EXTERNAL HEMORRHOIDS 07/14/2007   GERD 07/14/2007   Gout    HYPERTENSION 07/14/2007   PERIPHERAL NEUROPATHY 07/14/2007   Peripheral neuropathy 04/06/2019   PERIPHERAL VASCULAR DISEASE 07/14/2007   PODAGRA 07/14/2007   Prostate cancer (HCC) 02/2012   pt had screening and then biopsy   Retinal tear of right eye 03/19/2016   Stroke (HCC) 05/09/2023   TOBACCO ABUSE, HX OF 02/12/2010   TONSILLECTOMY, HX OF 07/14/2007   TREMOR, ESSENTIAL 07/14/2007   VITAMIN B12 DEFICIENCY 07/14/2007     Social History   Tobacco Use   Smoking status: Former    Current packs/day: 0.75    Average packs/day: 0.8 packs/day for 29.4 years (22.1 ttl pk-yrs)    Types: Cigarettes    Start date: 02/23/1994   Smokeless tobacco: Never  Vaping Use   Vaping status: Never Used  Substance Use Topics   Alcohol use: Not Currently   Drug use: No    Stephen Meza reports that he has quit smoking. His smoking use included cigarettes. He started smoking about 29 years ago. He has a 22.1 pack-year smoking history. He has never used smokeless tobacco. He reports that he does not currently use alcohol. He reports that he does not use drugs.  Tobacco Cessation: Counseling given: Not Answered Former smoker quit 1996 with a 22.1 pack year smoking history  Past surgical hx, Family hx, Social hx all reviewed.  Current Outpatient Medications on File Prior to Visit  Medication Sig   allopurinol  (ZYLOPRIM ) 100 MG tablet TAKE 1 TABLET BY MOUTH EVERY DAY   aspirin  EC 81 MG tablet Take 1 tablet (81 mg total) by mouth daily. Okay to restart on 07/27/2023.  Swallow whole.    benzonatate  (TESSALON ) 100 MG capsule TAKE 1 CAPSULE (100 MG TOTAL) BY MOUTH EVERY 6 (SIX) HOURS AS NEEDED FOR COUGH   carvedilol  (COREG ) 3.125 MG tablet TAKE 1 TABLET BY MOUTH TWICE A DAY WITH A MEAL   clopidogrel  (PLAVIX ) 75 MG tablet Take 1 tablet (75 mg total) by mouth daily. Okay to restart on 07/27/2023   Cyanocobalamin  (B-12) 3000 MCG CAPS Take 3,000 mcg by mouth daily.   diclofenac  Sodium (VOLTAREN ) 1 % GEL Apply 4 g topically 4 (four) times daily.   ezetimibe  (ZETIA ) 10 MG tablet TAKE 1 TABLET BY MOUTH EVERY DAY   omeprazole  (PRILOSEC) 40 MG capsule TAKE 1 CAPSULE BY MOUTH EVERY DAY   polyethylene glycol (MIRALAX ) 17 g packet Take 17 g by mouth daily as needed for moderate constipation or severe constipation (not helped with stool softner).   No current facility-administered medications on file prior to visit.     Allergies  Allergen Reactions   Nitrofuran Derivatives Rash   Ciprofloxacin Hcl Other (See Comments)    numbness   Statins     Intolerance, joint and muscle pain    Review Of Systems:  Constitutional:   +  weight loss, night sweats,  Fevers, chills, +fatigue, or  lassitude.  HEENT:   No headaches,  Difficulty swallowing,  Tooth/dental problems, or  Sore throat,                No sneezing, itching, ear ache, nasal congestion, post nasal drip,   CV:  No chest pain,  Orthopnea, PND, swelling in lower extremities, anasarca, dizziness, palpitations, syncope.   GI  No heartburn, indigestion, abdominal pain, nausea, vomiting, diarrhea, change in bowel habits, + loss of appetite, bloody stools.   Resp: + shortness of breath with exertion less  at rest.  No excess mucus, no productive cough,  No non-productive cough,  No coughing up of blood.  No change in color of mucus.  No wheezing.  No chest wall deformity  Skin: no rash or lesions.  GU: no dysuria, change in color of urine, no urgency or frequency.  No flank pain, no hematuria   MS:  No joint pain or swelling.  No  decreased range of motion.  No back pain.+ Right shoulder pain  Psych:  No change in mood or affect. No depression or anxiety.  No memory loss.   Vital Signs BP 131/83 (BP Location: Left Arm, Patient Position: Sitting, Cuff Size: Normal)   Pulse 94   Ht 6' (1.829 m)   Wt 155 lb (70.3 kg)   SpO2 93%   BMI 21.02 kg/m    Physical Exam:  General- No distress,  A&Ox3, pleasant ENT: No sinus tenderness, TM clear, pale nasal mucosa, no oral exudate,no post nasal drip, no LAN Cardiac: S1, S2, regular rate and rhythm, no murmur Chest: No wheeze/ rales/ dullness; no accessory muscle use, no nasal flaring, no sternal retractions, diminished per bases Abd.: Soft Non-tender, ND, BS +, Body mass index is 21.02 kg/m.  Ext: No clubbing cyanosis, edema, no obvious deformities Neuro:  normal strength, MAE x 4, A&O x 3, appropriate Skin: No rashes, warm and dry, No lesions  Psych: normal mood and behavior   Assessment/Plan Non-small cell lung cancer Squamous cell type involving right upper and lower lobes with lymph node involvement. Higher stage suggested by PET scan. Treatment options include SBRT, chemotherapy, or immunotherapy based on genetic markers. - Refer to radiation oncology for evaluation and treatment. - Refer to medical oncology for evaluation and potential chemotherapy or immunotherapy. - Schedule urgent consultations with both oncology specialties. - Higher education careers adviser and resources for lung cancer.  Cough Persistent cough with wheezing, possibly related to lung cancer. Albuterol inhaler prescribed for symptomatic relief. - Prescribe albuterol inhaler for as-needed use to manage wheezing and dyspnea. - Consider pulmonary function tests and walk test after completion of radiation therapy to evaluate for COPD, or whenever patient would like to get scheduled.   Anemia, unspecified Low hemoglobin at 11.4. Discussed potential fatigue and cautioned against aggressive iron   supplementation due to constipation risk. - Consider starting a daily multivitamin with iron . - Continue Miralax  and Colace to manage constipation. - Discuss anemia management with hematology during oncology consultation. - Trend CBC's   Constipation Managed with Miralax  and Colace, potentially exacerbated by iron  supplementation. - Continue Miralax  and Colace to manage constipation.  Shoulder pain Right shoulder pain possibly related to surgical positioning or strain. No skeletal metastasis on PET scan. Radiation may alleviate pain if related to lung nodules. - Consult sports medicine for evaluation and management of shoulder pain. - Consider physical therapy or ultrasound treatment for shoulder pain.   I spent 45 minutes dedicated to the care of this patient on the date of  this encounter to include pre-visit review of records, face-to-face time with the patient discussing conditions above, post visit ordering of testing, clinical documentation with the electronic health record, making appropriate referrals as documented, and communicating necessary information to the patient's healthcare team.   Raejean Bullock, NP 08/03/2023  9:19 AM

## 2023-08-03 NOTE — Patient Instructions (Addendum)
 It is good to see you today. I am glad you have done well since the biopsy. 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 This is a non small cell lung cancer.  I have referred you to both medical oncology and radiation oncology. You will get calls to get the consults scheduled. They will take great care of you at the cancer center.  Consider adding a multivitamin daily for your low hemaglobin Continue the Miralax  and Colace. Call if you need us .  Please contact office for sooner follow up if symptoms do not improve or worsen or seek emergency care   After radiation oncology, we will need to do PFT's and a walk test.  Call when you are ready to have that done. Please contact office for sooner follow up if symptoms do not improve or worsen or seek emergency care

## 2023-08-04 DIAGNOSIS — M4802 Spinal stenosis, cervical region: Secondary | ICD-10-CM | POA: Diagnosis not present

## 2023-08-04 DIAGNOSIS — M1990 Unspecified osteoarthritis, unspecified site: Secondary | ICD-10-CM | POA: Diagnosis not present

## 2023-08-04 DIAGNOSIS — M109 Gout, unspecified: Secondary | ICD-10-CM | POA: Diagnosis not present

## 2023-08-04 DIAGNOSIS — D519 Vitamin B12 deficiency anemia, unspecified: Secondary | ICD-10-CM | POA: Diagnosis not present

## 2023-08-04 DIAGNOSIS — I69354 Hemiplegia and hemiparesis following cerebral infarction affecting left non-dominant side: Secondary | ICD-10-CM | POA: Diagnosis not present

## 2023-08-04 DIAGNOSIS — Z9181 History of falling: Secondary | ICD-10-CM | POA: Diagnosis not present

## 2023-08-04 DIAGNOSIS — R911 Solitary pulmonary nodule: Secondary | ICD-10-CM | POA: Diagnosis not present

## 2023-08-04 DIAGNOSIS — Z8551 Personal history of malignant neoplasm of bladder: Secondary | ICD-10-CM | POA: Diagnosis not present

## 2023-08-04 DIAGNOSIS — I10 Essential (primary) hypertension: Secondary | ICD-10-CM | POA: Diagnosis not present

## 2023-08-04 DIAGNOSIS — Z7902 Long term (current) use of antithrombotics/antiplatelets: Secondary | ICD-10-CM | POA: Diagnosis not present

## 2023-08-04 DIAGNOSIS — G720 Drug-induced myopathy: Secondary | ICD-10-CM | POA: Diagnosis not present

## 2023-08-04 DIAGNOSIS — I499 Cardiac arrhythmia, unspecified: Secondary | ICD-10-CM | POA: Diagnosis not present

## 2023-08-05 ENCOUNTER — Other Ambulatory Visit: Payer: Self-pay

## 2023-08-05 DIAGNOSIS — D519 Vitamin B12 deficiency anemia, unspecified: Secondary | ICD-10-CM | POA: Diagnosis not present

## 2023-08-05 DIAGNOSIS — I499 Cardiac arrhythmia, unspecified: Secondary | ICD-10-CM | POA: Diagnosis not present

## 2023-08-05 DIAGNOSIS — Z7902 Long term (current) use of antithrombotics/antiplatelets: Secondary | ICD-10-CM | POA: Diagnosis not present

## 2023-08-05 DIAGNOSIS — G720 Drug-induced myopathy: Secondary | ICD-10-CM | POA: Diagnosis not present

## 2023-08-05 DIAGNOSIS — M1990 Unspecified osteoarthritis, unspecified site: Secondary | ICD-10-CM | POA: Diagnosis not present

## 2023-08-05 DIAGNOSIS — I69354 Hemiplegia and hemiparesis following cerebral infarction affecting left non-dominant side: Secondary | ICD-10-CM | POA: Diagnosis not present

## 2023-08-05 DIAGNOSIS — Z9181 History of falling: Secondary | ICD-10-CM | POA: Diagnosis not present

## 2023-08-05 DIAGNOSIS — M4802 Spinal stenosis, cervical region: Secondary | ICD-10-CM | POA: Diagnosis not present

## 2023-08-05 DIAGNOSIS — M109 Gout, unspecified: Secondary | ICD-10-CM | POA: Diagnosis not present

## 2023-08-05 DIAGNOSIS — R911 Solitary pulmonary nodule: Secondary | ICD-10-CM | POA: Diagnosis not present

## 2023-08-05 DIAGNOSIS — Z8551 Personal history of malignant neoplasm of bladder: Secondary | ICD-10-CM | POA: Diagnosis not present

## 2023-08-05 DIAGNOSIS — I10 Essential (primary) hypertension: Secondary | ICD-10-CM | POA: Diagnosis not present

## 2023-08-05 NOTE — Progress Notes (Signed)
 Thoracic Location of Tumor / Histology: Right Upper/Lower Lung  Patient presented for symptoms of pneumonia in January 2025.  During workup a CT Angio chest noted lung nodule.  CT Chest 03/26/2023 revealed a 6.3 x 5.2 cm right parahilar mass.  In the midst of workup for lung nodule patient had a stroke which required his bronchoscopy to be rescheduled.  CT Super D Chest 07/10/2023: Interval enlargement of right hilar mass now measuring 8.7 x 6.8 cm.  Slightly increased size of mediastinal lymph nodes, compatible with known metastatic disease.  Bronchoscopy:  MRI Brain 05/09/2023:   PET:  CT Chest 03/26/2023:   Biopsies of *** (if applicable) revealed: ***   Past/Anticipated interventions by pulmonary, if any: Dara Ear 08/03/2023 -I have made referral to both radiation oncology and medical oncology. He has already had PET scan and MR Brain for staging.  -Non-small cell lung cancer Squamous cell type involving right upper and lower lobes with lymph node involvement. Higher stage suggested by PET scan. Treatment options include SBRT, chemotherapy, or immunotherapy based on genetic markers.   Past/Anticipated interventions by cardiothoracic surgery, if any: {:18581}  Past/Anticipated interventions by medical oncology, if any: {:18581}   Tobacco/Marijuana/Snuff/ETOH use: Former  Signs/Symptoms Weight changes, if any: Lost about 12 pounds in the last year. Respiratory complaints, if any:  Hemoptysis, if any:  Pain issues, if any:    SAFETY ISSUES: Prior radiation?  Pacemaker/ICD?   Possible current pregnancy? Is the patient on methotrexate?   Current Complaints / other details:   Bladder Cancer History

## 2023-08-06 NOTE — Progress Notes (Signed)
 The proposed treatment discussed in conference is for discussion purpose only and is not a binding recommendation.  The patients have not been physically examined, or presented with their treatment options.  Therefore, final treatment plans cannot be decided.

## 2023-08-08 DIAGNOSIS — C3431 Malignant neoplasm of lower lobe, right bronchus or lung: Secondary | ICD-10-CM | POA: Insufficient documentation

## 2023-08-08 NOTE — Progress Notes (Addendum)
 Radiation Oncology         ([phone]) [phone] ________________________________  Name: Stephen Meza        MRN: [id number]  Date of Service: 08/10/2023 DOB: Jan 29, 1934  RR:Fjuuyztd, [name], FNP  Stephen Lamar RAMAN, MD     REFERRING PHYSICIAN: [name], MD   DIAGNOSIS: The encounter diagnosis was Malignant neoplasm of lower lobe of right lung (HCC).   HISTORY OF PRESENT ILLNESS: Stephen Meza is a 89 y.o. male seen at the request of Dr. Shelah for a new diagnosis of lung cancer.  The patient been about 12 pounds of unintentional weight loss in the last year and in January 2025 presented with symptoms of pneumonia.  CT angiography showed a 7.9 cm mass extending from the right hilum into the superior segment of the right lower lobe and he was treated for pneumonia.  On 03/26/2023 this was repeated as an outpatient after treatment and persistently showed a right perihilar mass extending into the right upper and lower lobes measuring up to 6.3 cm concerning for malignancy.  A PET scan on 04/22/2023 measured the area of 7.6 cm intensely hypermetabolic and hyperemia metabolic right lower paratracheal node was also appreciated as well as moderate metabolic activity in the left adrenal gland but most consistent with a benign adrenal adenoma.  He was going to proceed with bronchoscopy but presented with symptoms of a stroke in March and on 05/09/2023 had an MRI of the brain with and without contrast that confirmed multifocal acute infarcts in the right MCA and PCA territories, he recovered from this and was ultimately able to hold his Eliquis for bronchoscopy on 07/26/23.  His CT super D scan on 5/17 showed interval enlargement of his tumor to be 8/7 cm, and increase in the size of his mediastinal nodes. Cytology from bronchoscopy showed Squamous Cell Carcinoma in the right upper lobe brushings, biopsy as well as the right lower lobe brushings and biopsy.  He is seen to consider treatment for his  cancer, and is scheduled to see Dr. Sherrod this Thursday.    PREVIOUS RADIATION THERAPY: No   PAST MEDICAL HISTORY:  Past Medical History:  Diagnosis Date   DUODENITIS, WITHOUT HEMORRHAGE 07/14/2007   ERECTILE DYSFUNCTION 07/14/2007   Esophageal stricture 03/19/2016   ESOPHAGITIS 07/14/2007   EXTERNAL HEMORRHOIDS 07/14/2007   GERD 07/14/2007   Gout    HYPERTENSION 07/14/2007   PERIPHERAL NEUROPATHY 07/14/2007   Peripheral neuropathy 04/06/2019   PERIPHERAL VASCULAR DISEASE 07/14/2007   PODAGRA 07/14/2007   Prostate cancer (HCC) 02/2012   pt had screening and then biopsy   Retinal tear of right eye 03/19/2016   Stroke (HCC) 05/09/2023   TOBACCO ABUSE, HX OF 02/12/2010   TONSILLECTOMY, HX OF 07/14/2007   TREMOR, ESSENTIAL 07/14/2007   VITAMIN B12 DEFICIENCY 07/14/2007       PAST SURGICAL HISTORY: Past Surgical History:  Procedure Laterality Date   BRONCHIAL BIOPSY  07/26/2023   Procedure: BRONCHOSCOPY, WITH BIOPSY;  Surgeon: Stephen Lamar RAMAN, MD;  Location: [facility] ENDOSCOPY;  Service: Pulmonary;;   BRONCHIAL BRUSHINGS  07/26/2023   Procedure: BRONCHOSCOPY, WITH BRUSH BIOPSY;  Surgeon: Stephen Lamar RAMAN, MD;  Location: [facility] ENDOSCOPY;  Service: Pulmonary;;   Excision of lipoma     PROSTATE BIOPSY Bilateral 03/10/2012   surgery to reduce turbinate and straighten deviated septum  2016   TONSILLECTOMY     TRANSCAROTID ARTERY REVASCULARIZATION  Right 05/14/2023   Procedure: TRANSCAROTID ARTERY REVASCULARIZATION (TCAR) UISNG 10mm X 40mm ENROUTE  TRANSCAROTID STENT SYSTEM;  Surgeon: Stephen Fonda BRAVO, MD;  Location: [facility] OR;  Service: Vascular;  Laterality: Right;   VIDEO BRONCHOSCOPY  07/26/2023   Procedure: VIDEO BRONCHOSCOPY WITHOUT FLUORO;  Surgeon: Stephen Lamar RAMAN, MD;  Location[facility] ENDOSCOPY;  Service: Pulmonary;;     FAMILY HISTORY:  Family History  Problem Relation Age of Onset   Hypothyroidism Daughter    Breast cancer Other    Cancer Mother    Other Father    Breast cancer  Sister      SOCIAL HISTORY:  reports that he has quit smoking. His smoking use included cigarettes. He started smoking about 29 years ago. He has a 22.1 pack-year smoking history. He has never used smokeless tobacco. He reports that he does not currently use alcohol. He reports that he does not use drugs.  The patient is married and resides in [place]. He's accompanied by his wife and daughter, and second daughter joins by speakerphone.  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 Prior to January, he was mowing 4 acres of land, tending and raising a garden, and very physically active.   ALLERGIES: Nitrofuran derivatives, Ciprofloxacin hcl, and Statins   MEDICATIONS:  Current Outpatient Medications  Medication Sig Dispense Refill   albuterol  (VENTOLIN  HFA) 108 (90 Base) MCG/ACT inhaler Take 1 puff as needed for shortness of breath or wheezing, 8.5 g 2   allopurinol  (ZYLOPRIM ) 100 MG tablet TAKE 1 TABLET BY MOUTH EVERY DAY 90 tablet 4   aspirin  EC 81 MG tablet Take 1 tablet (81 mg total) by mouth daily. Okay to restart on 07/27/2023.  Swallow whole.     benzonatate  (TESSALON ) 100 MG capsule TAKE 1 CAPSULE (100 MG TOTAL) BY MOUTH EVERY 6 (SIX) HOURS AS NEEDED FOR COUGH 30 capsule 0   carvedilol  (COREG ) 3.125 MG tablet TAKE 1 TABLET BY MOUTH TWICE A DAY WITH A MEAL 180 tablet 1   clopidogrel  (PLAVIX ) 75 MG tablet Take 1 tablet (75 mg total) by mouth daily. Okay to restart on 07/27/2023     Cyanocobalamin  (B-12) 3000 MCG CAPS Take 3,000 mcg by mouth daily.     diclofenac  Sodium (VOLTAREN ) 1 % GEL Apply 4 g topically 4 (four) times daily. 150 g 3   docusate sodium  (COLACE) 100 MG capsule Take 100 mg by mouth 2 (two) times daily.     ezetimibe  (ZETIA ) 10 MG tablet TAKE 1 TABLET BY MOUTH EVERY DAY 90 tablet 1   omeprazole  (PRILOSEC) 40 MG capsule TAKE 1 CAPSULE BY MOUTH EVERY DAY 90 capsule 2   polyethylene  glycol (MIRALAX ) 17 g packet Take 17 g by mouth daily as needed for moderate constipation or severe constipation (not helped with stool softner). 10 each 0   No current facility-administered medications for this encounter.     REVIEW OF SYSTEMS: On review of systems, the patient reports that he is doing okay. He's lost about 25 pounds in the last 6 months. He reports shortness of breath with exertion, and states his LLE is slower to follow command since his stroke when ambulating, but at home he is still walking. In the last week he's had a few episodes of dizziness with reported normal O2 sats, BP and pulse at home. He notes cough and occasional blood tinged sputum without large clots or more significant bleeding. No other complaints are verbalized.       PHYSICAL  EXAM:  Wt Readings from Last 3 Encounters:  08/10/23 153 lb 6 oz (69.6 kg)  08/03/23 155 lb (70.3 kg)  07/29/23 155 lb (70.3 kg)   Temp Readings from Last 3 Encounters:  08/10/23 (!) 96.9 F (36.1 C) (Temporal)  07/29/23 97.6 F (36.4 C) (Temporal)  07/26/23 98.4 F (36.9 C)   BP Readings from Last 3 Encounters:  08/10/23 139/84  08/03/23 120/70  08/03/23 131/83   Pulse Readings from Last 3 Encounters:  08/10/23 (!) 102  08/03/23 (!) 101  08/03/23 94   Pain Assessment Pain Score: 0-No pain/10  In general this is a tired appearing caucasian male despite this who appears younger than his stated age in no acute distress. He's alert and oriented x4 and appropriate throughout the examination. Cardiopulmonary assessment is negative for acute distress and he exhibits normal effort.     ECOG = 1-2  0 - Asymptomatic (Fully active, able to carry on all predisease activities without restriction)  1 - Symptomatic but completely ambulatory (Restricted in physically strenuous activity but ambulatory and able to carry out work of a light or sedentary nature. For example, light housework, office work)  2 - Symptomatic, <50%  in bed during the day (Ambulatory and capable of all self care but unable to carry out any work activities. Up and about more than 50% of waking hours)  3 - Symptomatic, >50% in bed, but not bedbound (Capable of only limited self-care, confined to bed or chair 50% or more of waking hours)  4 - Bedbound (Completely disabled. Cannot carry on any self-care. Totally confined to bed or chair)  5 - Death   Raylene MM, Creech RH, Tormey DC, et al. [phone]). Toxicity and response criteria of the [facility]. Am. DOROTHA Bridges. Oncol. 5 (6): 649-55    LABORATORY DATA:  Lab Results  Component Value Date   WBC 7.6 07/26/2023   HGB 11.4 (L) 07/26/2023   HCT 36.8 (L) 07/26/2023   MCV 88.7 07/26/2023   PLT 253 07/26/2023   Lab Results  Component Value Date   NA 135 07/26/2023   K 4.5 07/26/2023   CL 101 07/26/2023   CO2 25 07/26/2023   Lab Results  Component Value Date   ALT 8 05/21/2023   AST 18 05/21/2023   ALKPHOS 77 05/21/2023   BILITOT 0.5 05/21/2023      RADIOGRAPHY: DG Shoulder Right Result Date: 07/29/2023 CLINICAL DATA:  Right shoulder pain for several months. EXAM: RIGHT SHOULDER - 2+ VIEW COMPARISON:  None Available. FINDINGS: There is no evidence of fracture or dislocation. Minimal degenerative changes seen involving the right glenohumeral joint. Soft tissues are unremarkable. IMPRESSION: Minimal osteoarthritis of right glenohumeral joint. No acute abnormality seen. Electronically Signed   By: Lynwood Landy Raddle M.D.   On: 07/29/2023 14:50   DG C-Arm 1-60 Min-No Report Result Date: 07/26/2023 Fluoroscopy was utilized by the requesting physician.  No radiographic interpretation.       IMPRESSION/PLAN: 1. Stage IIIB, cT3N2M0, NSCLC, Squamous Cell Carcinoma of the RLL invading the hilum and RUL. Dr. Dewey discusses the pathology findings and reviews the nature of locally advanced lung cancer. Dr. Dewey discusses the standard form of therapy would be for  chemoradiation. He will meet with Dr. Sherrod as well this week to discuss chemosensitization. Dr. Dewey discusses that given the time since his last PET scan, and new symptoms and large tumor burden, he would recommend restaging PET scan if his insurance will approve this.  We discussed the risks, benefits, short, and long term effects of radiotherapy, as well as the curative intent, and the patient is interested in proceeding. Dr. Dewey discusses the delivery and logistics of radiotherapy and anticipates a course of 6 1/2  weeks of radiotherapy to the RLL and regional nodes. Written consent is obtained and placed in the chart, a copy was provided to the patient. The patient will be contacted to coordinate treatment planning by our simulation department. We anticipate we'd star therapy on 08/23/23.   2. Dizziness with a history of #1 and recent stroke. We will repeat an MRI brain and refer to Dr. Buckley as the patient does not have ongoing neurological appointments.   In a visit lasting 75 minutes, greater than 50% of the time was spent face to face discussing the patient's condition, in preparation for the discussion, and coordinating the patient's care.   The above documentation reflects my direct findings during this shared patient visit. Please see the separate note by Dr. Dewey on this date for the remainder of the patient's plan of care.    Donald KYM Husband, [facility]   **Disclaimer: This note was dictated with voice recognition software. Similar sounding words can inadvertently be transcribed and this note may contain transcription errors which may not have been corrected upon publication of note.**

## 2023-08-10 ENCOUNTER — Ambulatory Visit: Admitting: Orthopedic Surgery

## 2023-08-10 ENCOUNTER — Encounter: Payer: Self-pay | Admitting: Radiation Oncology

## 2023-08-10 ENCOUNTER — Ambulatory Visit
Admission: RE | Admit: 2023-08-10 | Discharge: 2023-08-10 | Disposition: A | Discharge status: Home / Self Care | Source: Ambulatory Visit | Attending: Radiation Oncology | Admitting: Radiation Oncology

## 2023-08-10 ENCOUNTER — Ambulatory Visit
Admission: RE | Admit: 2023-08-10 | Discharge: 2023-08-10 | Discharge status: Home / Self Care | Source: Ambulatory Visit | Attending: Radiation Oncology | Admitting: Radiation Oncology

## 2023-08-10 VITALS — BP 139/84 | HR 102 | Temp 96.9°F | Resp 18 | Ht 72.0 in | Wt 153.4 lb

## 2023-08-10 DIAGNOSIS — I739 Peripheral vascular disease, unspecified: Secondary | ICD-10-CM | POA: Insufficient documentation

## 2023-08-10 DIAGNOSIS — Z79899 Other long term (current) drug therapy: Secondary | ICD-10-CM | POA: Diagnosis not present

## 2023-08-10 DIAGNOSIS — I10 Essential (primary) hypertension: Secondary | ICD-10-CM | POA: Diagnosis not present

## 2023-08-10 DIAGNOSIS — Z8673 Personal history of transient ischemic attack (TIA), and cerebral infarction without residual deficits: Secondary | ICD-10-CM | POA: Insufficient documentation

## 2023-08-10 DIAGNOSIS — Z803 Family history of malignant neoplasm of breast: Secondary | ICD-10-CM | POA: Diagnosis not present

## 2023-08-10 DIAGNOSIS — Z7902 Long term (current) use of antithrombotics/antiplatelets: Secondary | ICD-10-CM | POA: Diagnosis not present

## 2023-08-10 DIAGNOSIS — B351 Tinea unguium: Secondary | ICD-10-CM | POA: Diagnosis not present

## 2023-08-10 DIAGNOSIS — C3411 Malignant neoplasm of upper lobe, right bronchus or lung: Secondary | ICD-10-CM | POA: Diagnosis not present

## 2023-08-10 DIAGNOSIS — K219 Gastro-esophageal reflux disease without esophagitis: Secondary | ICD-10-CM | POA: Insufficient documentation

## 2023-08-10 DIAGNOSIS — E538 Deficiency of other specified B group vitamins: Secondary | ICD-10-CM | POA: Diagnosis not present

## 2023-08-10 DIAGNOSIS — C3431 Malignant neoplasm of lower lobe, right bronchus or lung: Secondary | ICD-10-CM | POA: Diagnosis not present

## 2023-08-10 DIAGNOSIS — I639 Cerebral infarction, unspecified: Secondary | ICD-10-CM

## 2023-08-10 DIAGNOSIS — M19011 Primary osteoarthritis, right shoulder: Secondary | ICD-10-CM | POA: Diagnosis not present

## 2023-08-10 DIAGNOSIS — G629 Polyneuropathy, unspecified: Secondary | ICD-10-CM | POA: Diagnosis not present

## 2023-08-10 DIAGNOSIS — I872 Venous insufficiency (chronic) (peripheral): Secondary | ICD-10-CM | POA: Diagnosis not present

## 2023-08-10 DIAGNOSIS — Z87891 Personal history of nicotine dependence: Secondary | ICD-10-CM | POA: Insufficient documentation

## 2023-08-10 NOTE — Addendum Note (Signed)
 Encounter addended by: Bettejane Brownie, PA-C on: 08/10/2023 12:42 PM  Actions taken: Order list changed, Diagnosis association updated, Visit diagnoses modified

## 2023-08-11 ENCOUNTER — Ambulatory Visit (HOSPITAL_COMMUNITY)
Admission: RE | Admit: 2023-08-11 | Discharge: 2023-08-11 | Disposition: A | Discharge status: Home / Self Care | Source: Ambulatory Visit | Attending: Radiation Oncology | Admitting: Radiation Oncology

## 2023-08-11 ENCOUNTER — Other Ambulatory Visit: Payer: Self-pay | Admitting: Internal Medicine

## 2023-08-11 DIAGNOSIS — C3431 Malignant neoplasm of lower lobe, right bronchus or lung: Secondary | ICD-10-CM | POA: Insufficient documentation

## 2023-08-11 DIAGNOSIS — C3430 Malignant neoplasm of lower lobe, unspecified bronchus or lung: Secondary | ICD-10-CM | POA: Diagnosis not present

## 2023-08-11 DIAGNOSIS — R42 Dizziness and giddiness: Secondary | ICD-10-CM | POA: Diagnosis not present

## 2023-08-11 DIAGNOSIS — Z87891 Personal history of nicotine dependence: Secondary | ICD-10-CM | POA: Diagnosis not present

## 2023-08-11 DIAGNOSIS — I6782 Cerebral ischemia: Secondary | ICD-10-CM | POA: Diagnosis not present

## 2023-08-11 DIAGNOSIS — R918 Other nonspecific abnormal finding of lung field: Secondary | ICD-10-CM | POA: Diagnosis not present

## 2023-08-11 DIAGNOSIS — C3490 Malignant neoplasm of unspecified part of unspecified bronchus or lung: Secondary | ICD-10-CM | POA: Diagnosis not present

## 2023-08-11 DIAGNOSIS — G319 Degenerative disease of nervous system, unspecified: Secondary | ICD-10-CM | POA: Diagnosis not present

## 2023-08-11 LAB — GLUCOSE, CAPILLARY: Glucose-Capillary: 103 mg/dL — ABNORMAL HIGH (ref 70–99)

## 2023-08-11 MED ORDER — GADOBUTROL 1 MMOL/ML IV SOLN
7.0000 mL | Freq: Once | INTRAVENOUS | Status: AC | PRN
  Administered 2023-08-11: 7 mL via INTRAVENOUS

## 2023-08-11 MED ORDER — FLUDEOXYGLUCOSE F - 18 (FDG) INJECTION
7.5900 | Freq: Once | INTRAVENOUS | Status: AC
Start: 2023-08-11 — End: 2023-08-11
  Administered 2023-08-11: 7.59 via INTRAVENOUS

## 2023-08-12 ENCOUNTER — Inpatient Hospital Stay

## 2023-08-12 ENCOUNTER — Encounter: Payer: Self-pay | Admitting: Internal Medicine

## 2023-08-12 ENCOUNTER — Ambulatory Visit
Admission: RE | Admit: 2023-08-12 | Discharge: 2023-08-12 | Disposition: A | Discharge status: Home / Self Care | Source: Ambulatory Visit | Attending: Radiation Oncology | Admitting: Radiation Oncology

## 2023-08-12 ENCOUNTER — Inpatient Hospital Stay: Admitting: Internal Medicine

## 2023-08-12 ENCOUNTER — Telehealth: Payer: Self-pay | Admitting: Radiation Oncology

## 2023-08-12 VITALS — BP 115/84 | HR 101 | Temp 98.2°F | Resp 18 | Wt 154.5 lb

## 2023-08-12 DIAGNOSIS — C3431 Malignant neoplasm of lower lobe, right bronchus or lung: Secondary | ICD-10-CM | POA: Diagnosis not present

## 2023-08-12 DIAGNOSIS — C61 Malignant neoplasm of prostate: Secondary | ICD-10-CM | POA: Insufficient documentation

## 2023-08-12 DIAGNOSIS — G629 Polyneuropathy, unspecified: Secondary | ICD-10-CM | POA: Insufficient documentation

## 2023-08-12 DIAGNOSIS — C3411 Malignant neoplasm of upper lobe, right bronchus or lung: Secondary | ICD-10-CM | POA: Insufficient documentation

## 2023-08-12 DIAGNOSIS — K2100 Gastro-esophageal reflux disease with esophagitis, without bleeding: Secondary | ICD-10-CM | POA: Diagnosis not present

## 2023-08-12 DIAGNOSIS — Z8551 Personal history of malignant neoplasm of bladder: Secondary | ICD-10-CM | POA: Diagnosis not present

## 2023-08-12 DIAGNOSIS — Z5111 Encounter for antineoplastic chemotherapy: Secondary | ICD-10-CM | POA: Insufficient documentation

## 2023-08-12 DIAGNOSIS — I10 Essential (primary) hypertension: Secondary | ICD-10-CM | POA: Insufficient documentation

## 2023-08-12 DIAGNOSIS — K222 Esophageal obstruction: Secondary | ICD-10-CM | POA: Insufficient documentation

## 2023-08-12 DIAGNOSIS — M109 Gout, unspecified: Secondary | ICD-10-CM | POA: Insufficient documentation

## 2023-08-12 DIAGNOSIS — Z510 Encounter for antineoplastic radiation therapy: Secondary | ICD-10-CM | POA: Insufficient documentation

## 2023-08-12 LAB — CBC WITH DIFFERENTIAL (CANCER CENTER ONLY)
Abs Immature Granulocytes: 0.04 10*3/uL (ref 0.00–0.07)
Basophils Absolute: 0.1 10*3/uL (ref 0.0–0.1)
Basophils Relative: 1 %
Eosinophils Absolute: 0.2 10*3/uL (ref 0.0–0.5)
Eosinophils Relative: 2 %
HCT: 36.6 % — ABNORMAL LOW (ref 39.0–52.0)
Hemoglobin: 11.5 g/dL — ABNORMAL LOW (ref 13.0–17.0)
Immature Granulocytes: 0 %
Lymphocytes Relative: 11 %
Lymphs Abs: 1 10*3/uL (ref 0.7–4.0)
MCH: 27 pg (ref 26.0–34.0)
MCHC: 31.4 g/dL (ref 30.0–36.0)
MCV: 85.9 fL (ref 80.0–100.0)
Monocytes Absolute: 0.7 10*3/uL (ref 0.1–1.0)
Monocytes Relative: 7 %
Neutro Abs: 7.8 10*3/uL — ABNORMAL HIGH (ref 1.7–7.7)
Neutrophils Relative %: 79 %
Platelet Count: 296 10*3/uL (ref 150–400)
RBC: 4.26 MIL/uL (ref 4.22–5.81)
RDW: 16.1 % — ABNORMAL HIGH (ref 11.5–15.5)
WBC Count: 9.9 10*3/uL (ref 4.0–10.5)
nRBC: 0 % (ref 0.0–0.2)

## 2023-08-12 LAB — CMP (CANCER CENTER ONLY)
ALT: 17 U/L (ref 0–44)
AST: 16 U/L (ref 15–41)
Albumin: 3.9 g/dL (ref 3.5–5.0)
Alkaline Phosphatase: 73 U/L (ref 38–126)
Anion gap: 6 (ref 5–15)
BUN: 31 mg/dL — ABNORMAL HIGH (ref 8–23)
CO2: 30 mmol/L (ref 22–32)
Calcium: 9.7 mg/dL (ref 8.9–10.3)
Chloride: 100 mmol/L (ref 98–111)
Creatinine: 0.74 mg/dL (ref 0.61–1.24)
GFR, Estimated: >60 mL/min (ref >60)
Glucose, Bld: 134 mg/dL — ABNORMAL HIGH (ref 70–99)
Potassium: 4.3 mmol/L (ref 3.5–5.1)
Sodium: 136 mmol/L (ref 135–145)
Total Bilirubin: 0.4 mg/dL (ref 0.0–1.2)
Total Protein: 7.6 g/dL (ref 6.5–8.1)

## 2023-08-12 NOTE — Progress Notes (Signed)
 [facility] [phone]   [phone]) [phone]  CONSULT NOTE  REFERRING PHYSICIAN: Dr. Racheal Buddle  REASON FOR CONSULTATION:  89 years old white male recently diagnosed with lung cancer  HPI Stephen Meza is a 89 y.o. male. Discussed the use of AI scribe software for clinical note transcription with the patient, who gave verbal consent to proceed.  History of Present Illness   Stephen Meza is an 89 year old male with non-small cell lung cancer who presents for oncology consultation. He is accompanied by his wife, Stephen Meza, and his daughters, Stephen Meza and Stephen Meza. He was referred by Dr. Baldwin Levee, for evaluation of lung cancer.  In January, he was diagnosed with pneumonia and was seen at a first care facility where a mass in the lung was suspected. He was referred to a lung specialist, and imaging studies including three CT scans and a PET scan revealed a mass in the right upper and lower lobes with right hilar and mediastinal  involvement. A biopsy was scheduled for March 16 but was delayed due to a stroke on March 15.  He experienced tingling in his hand and arm, leading to an emergency room visit where imaging revealed a completely blocked right artery. He was placed on blood thinners, delaying the lung biopsy. After recovering from the stroke, a bronchoscopy on June 2 confirmed non-small cell lung cancer, specifically squamous cell carcinoma. A repeat PET scan on June 18 showed no new findings, with a persistent large mass and lymph node involvement.  He feels physically weak, with restricted breathing and wheezing. He uses a cane due to neuropathy in his legs, which worsened after the stroke. He has lost weight, dropping from 175 to 153 pounds, and occasionally coughs up red-streaked saliva, which began before the biopsy.  His past medical history includes a stroke in March, high blood pressure, heart disease, prostate cancer, bladder cancer,  gout, acid reflux, neuropathy, and esophageal stricture. He has a history of smoking for approximately 25-30 years, quitting 35 years ago. He does not currently consume alcohol or use street drugs.  Family history reveals his mother and sister had breast cancer. He has been married for 67 years and has two daughters. 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      HPI  Past Medical History:  Diagnosis Date   DUODENITIS, WITHOUT HEMORRHAGE 07/14/2007   ERECTILE DYSFUNCTION 07/14/2007   Esophageal stricture 03/19/2016   ESOPHAGITIS 07/14/2007   EXTERNAL HEMORRHOIDS 07/14/2007   GERD 07/14/2007   Gout    HYPERTENSION 07/14/2007   PERIPHERAL NEUROPATHY 07/14/2007   Peripheral neuropathy 04/06/2019   PERIPHERAL VASCULAR DISEASE 07/14/2007   PODAGRA 07/14/2007   Prostate cancer (HCC) 02/2012   pt had screening and then biopsy   Retinal tear of right eye 03/19/2016   Stroke (HCC) 05/09/2023   TOBACCO ABUSE, HX OF 02/12/2010   TONSILLECTOMY, HX OF 07/14/2007   TREMOR, ESSENTIAL 07/14/2007   VITAMIN B12 DEFICIENCY 07/14/2007    Past Surgical History:  Procedure Laterality Date   BRONCHIAL BIOPSY  07/26/2023   Procedure: BRONCHOSCOPY, WITH BIOPSY;  Surgeon: Denson Flake, MD;  Location: [facility] ENDOSCOPY;  Service: Pulmonary;;   BRONCHIAL BRUSHINGS  07/26/2023   Procedure: BRONCHOSCOPY, WITH BRUSH BIOPSY;  Surgeon: Denson Flake, MD;  Location: [facility] ENDOSCOPY;  Service: Pulmonary;;   Excision of lipoma     PROSTATE BIOPSY Bilateral 03/10/2012   surgery to reduce  turbinate and straighten deviated septum  2016   TONSILLECTOMY     TRANSCAROTID ARTERY REVASCULARIZATION  Right 05/14/2023   Procedure: TRANSCAROTID ARTERY REVASCULARIZATION (TCAR) UISNG 10mm X 40mm ENROUTE TRANSCAROTID STENT SYSTEM;  Surgeon: Kayla Part, MD;  Location[facility];  Service: Vascular;  Laterality: Right;   VIDEO BRONCHOSCOPY  07/26/2023   Procedure: VIDEO  BRONCHOSCOPY WITHOUT FLUORO;  Surgeon: Denson Flake, MD;  Location: [facility] ENDOSCOPY;  Service: Pulmonary;;    Family History  Problem Relation Age of Onset   Hypothyroidism Daughter    Breast cancer Other    Cancer Mother    Other Father    Breast cancer Sister     Social History Social History   Tobacco Use   Smoking status: Former    Current packs/day: 0.75    Average packs/day: 0.7 packs/day for 29.5 years (22.1 ttl pk-yrs)    Types: Cigarettes    Start date: 02/23/1994   Smokeless tobacco: Never  Vaping Use   Vaping status: Never Used  Substance Use Topics   Alcohol use: Not Currently   Drug use: No    Allergies  Allergen Reactions   Nitrofuran Derivatives Rash   Ciprofloxacin Hcl Other (See Comments)    numbness   Statins     Intolerance, joint and muscle pain    Current Outpatient Medications  Medication Sig Dispense Refill   albuterol  (VENTOLIN  HFA) 108 (90 Base) MCG/ACT inhaler Take 1 puff as needed for shortness of breath or wheezing, 8.5 g 2   allopurinol  (ZYLOPRIM ) 100 MG tablet TAKE 1 TABLET BY MOUTH EVERY DAY 90 tablet 4   aspirin  EC 81 MG tablet Take 1 tablet (81 mg total) by mouth daily. Okay to restart on 07/27/2023.  Swallow whole.     benzonatate  (TESSALON ) 100 MG capsule TAKE 1 CAPSULE (100 MG TOTAL) BY MOUTH EVERY 6 (SIX) HOURS AS NEEDED FOR COUGH 30 capsule 0   carvedilol  (COREG ) 3.125 MG tablet TAKE 1 TABLET BY MOUTH TWICE A DAY WITH A MEAL 180 tablet 1   clopidogrel  (PLAVIX ) 75 MG tablet Take 1 tablet (75 mg total) by mouth daily. Okay to restart on 07/27/2023     Cyanocobalamin  (B-12) 3000 MCG CAPS Take 3,000 mcg by mouth daily.     diclofenac  Sodium (VOLTAREN ) 1 % GEL Apply 4 g topically 4 (four) times daily. 150 g 3   docusate sodium  (COLACE) 100 MG capsule Take 100 mg by mouth 2 (two) times daily.     ezetimibe  (ZETIA ) 10 MG tablet TAKE 1 TABLET BY MOUTH EVERY DAY 90 tablet 1   omeprazole  (PRILOSEC) 40 MG capsule TAKE 1 CAPSULE BY MOUTH EVERY DAY  90 capsule 2   polyethylene glycol (MIRALAX ) 17 g packet Take 17 g by mouth daily as needed for moderate constipation or severe constipation (not helped with stool softner). 10 each 0   No current facility-administered medications for this visit.    Review of Systems  Constitutional: positive for fatigue Eyes: negative Ears, nose, mouth, throat, and face: negative Respiratory: positive for cough, dyspnea on exertion, and pleurisy/chest pain Cardiovascular: negative Gastrointestinal: negative Genitourinary:negative Integument/breast: negative Hematologic/lymphatic: negative Musculoskeletal:positive for muscle weakness Neurological: positive for weakness Behavioral/Psych: negative Endocrine: negative Allergic/Immunologic: negative  Physical Exam  KGM:WNUUV, healthy, no distress, well nourished, and well developed SKIN: skin color, texture, turgor are normal, no rashes or significant lesions HEAD: Normocephalic, No masses, lesions, tenderness or abnormalities EYES: normal, PERRLA, Conjunctiva are pink and non-injected EARS: External ears normal, Canals  clear OROPHARYNX:no exudate, no erythema, and lips, buccal mucosa, and tongue normal  NECK: supple, no adenopathy, no JVD LYMPH:  no palpable lymphadenopathy, no hepatosplenomegaly LUNGS: coarse sounds heard, decreased breath sounds HEART: regular rate & rhythm, no murmurs, and no gallops ABDOMEN:abdomen soft, non-tender, normal bowel sounds, and no masses or organomegaly BACK: Back symmetric, no curvature., No CVA tenderness EXTREMITIES:no joint deformities, effusion, or inflammation, no edema  NEURO: alert & oriented x 3 with fluent speech, no focal motor/sensory deficits  PERFORMANCE STATUS: ECOG 1  LABORATORY DATA: Lab Results  Component Value Date   WBC 9.9 08/12/2023   HGB 11.5 (L) 08/12/2023   HCT 36.6 (L) 08/12/2023   MCV 85.9 08/12/2023   PLT 296 08/12/2023      Chemistry      Component Value Date/Time   NA  136 08/12/2023 1351   K 4.3 08/12/2023 1351   CL 100 08/12/2023 1351   CO2 30 08/12/2023 1351   BUN 31 (H) 08/12/2023 1351   CREATININE 0.74 08/12/2023 1351      Component Value Date/Time   CALCIUM  9.7 08/12/2023 1351   ALKPHOS 73 08/12/2023 1351   AST 16 08/12/2023 1351   ALT 17 08/12/2023 1351   BILITOT 0.4 08/12/2023 1351       RADIOGRAPHIC STUDIES: NM PET Image Restag (PS) Skull Base To Thigh Result Date: 08/11/2023 CLINICAL DATA:  Subsequent treatment strategy for non-small cell lung cancer. Treatment after initial staging interrupted by stroke. Restaging needed. EXAM: NUCLEAR MEDICINE PET SKULL BASE TO THIGH TECHNIQUE: 7.59 mCi F-18 FDG was injected intravenously. Full-ring PET imaging was performed from the skull base to thigh after the radiotracer. CT data was obtained and used for attenuation correction and anatomic localization. Fasting blood glucose: 103 mg/dl COMPARISON:  PET-CT 60/45/4098.  Chest CT 07/10/2023. FINDINGS: Mediastinal blood pool activity: SUV max 2.8 NECK: No hypermetabolic cervical lymph nodes are identified. No suspicious activity identified within the pharyngeal mucosal space. Incidental CT findings: Bilateral carotid atherosclerosis with a probable right carotid stent. CHEST: Large hypermetabolic right hilar mass is again noted, slightly enlarged from the previous PET-CT. This currently measures 8.4 x 6.2 cm on image 40/7 (previously 7.6 x 5.6 cm). There is fairly homogeneous hypermetabolic activity within this lesion with an SUV max of 22.3 (previously 26.2). As before, this mass involves all 3 lobes and partially encases the right upper, middle and lower lobe bronchi which remain patent. No peripheral hypermetabolic pulmonary activity or other suspicious pulmonary nodularity. No significant changes small subcarinal node measuring 7 mm short axis on image 74/4 with an SUV max of 5.2. No other separate hypermetabolic mediastinal lymph nodes. Incidental CT findings:  Atherosclerosis of the aorta, great vessels and coronary arteries. Mild emphysema with scattered subpleural reticulation in both lungs and mild asymmetric right upper lobe scarring. No lobar collapse. ABDOMEN/PELVIS: Similar small mildly hypermetabolic left adrenal nodule measuring 2.1 x 1.4 cm with an SUV max of 5.6 (previously 4.5). This nodule measures 27 HU, although is similar in size to prior CT's dating back to 04/27/2017, favoring an adenoma. No hypermetabolic activity within the right adrenal gland, liver, pancreas or spleen. There is no hypermetabolic nodal activity in the abdomen or pelvis. Incidental CT findings: Contrast material within the renal collecting systems and bladder from preceding brain MRI. Aortic and branch vessel atherosclerosis with a 4.0 x 4.7 cm infrarenal abdominal aortic aneurysm, unchanged from previous PET-CT (remeasured). Treatment changes within the prostate gland. The urinary bladder is trabeculated. SKELETON: There is no  hypermetabolic activity to suggest osseous metastatic disease. Incidental CT findings: Multilevel spondylosis. Grossly stable chronic compression deformities at L1 and L2, without hypermetabolic activity. IMPRESSION: 1. Little overall change compared with previous PET-CT of 04/22/2023. The large hypermetabolic right hilar mass has slightly enlarged in the interval with similar intense hypermetabolic activity, consistent with known primary bronchogenic carcinoma. 2. Stable small hypermetabolic subcarinal lymph node, suspicious for nodal metastasis. 3. Similar small mildly hypermetabolic left adrenal nodule, indeterminate although likely an adenoma based on stability from remote CT's. 4. No other evidence of distant metastatic disease. 5. Stable infrarenal abdominal aortic aneurysm measuring 4.7 cm. Recommend CTA or MRA, as appropriate, in 12 months and referral to vascular specialist. Reference: Journal of Vascular Surgery 67.1 (2018): 2-77. J Am Coll Radiol  2013;10:789-794. 6. Aortic Atherosclerosis (ICD10-I70.0) and Emphysema (ICD10-J43.9). Electronically Signed   By: Elmon Hagedorn M.D.   On: 08/11/2023 18:18   DG Shoulder Right Result Date: 07/29/2023 CLINICAL DATA:  Right shoulder pain for several months. EXAM: RIGHT SHOULDER - 2+ VIEW COMPARISON:  None Available. FINDINGS: There is no evidence of fracture or dislocation. Minimal degenerative changes seen involving the right glenohumeral joint. Soft tissues are unremarkable. IMPRESSION: Minimal osteoarthritis of right glenohumeral joint. No acute abnormality seen. Electronically Signed   By: Rosalene Colon M.D.   On: 07/29/2023 14:50   DG C-Arm 1-60 Min-No Report Result Date: 07/26/2023 Fluoroscopy was utilized by the requesting physician.  No radiographic interpretation.    ASSESSMENT AND PLAN:    Non-small cell lung cancer, stage 3B (T3, N2b, M0) diagnosed in June 2025 Squamous cell carcinoma located in the right upper and lower lobes with mediastinal lymph node involvement. Stage 3B due to tumor size and lymph node involvement. Surgery is not feasible due to anatomical involvement, age, and COPD. Treatment involves chemoradiation, with chemotherapy as a radiosensitizer. Radiation is the primary treatment. Minimal chemotherapy side effects expected due to low dosage. Potential for cure exists but is not guaranteed. Immunotherapy may be considered post-treatment based on response. The goal is to attempt cure and control disease progression. - Administer chemotherapy with weekly Carboplatin for AUC 2 and Paclitaxel 45 mg/m2 on Mondays for six and a half weeks. - Administer radiation therapy for six and a half weeks. - Monitor blood count, kidney, and liver function during treatment. - Repeat chest scan post-treatment to assess efficacy. - Consider immunotherapy post-treatment based on response. - Provide antiemetics for home use. - Schedule chemotherapy education session for patient and  family. - Insert port for treatment administration.  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 Recovery ongoing with residual weakness and fatigue.  Peripheral neuropathy Peripheral neuropathy affecting legs, contributing to mobility issues. Managed with physical therapy post-stroke.  Hypertension Hypertension.  Gout Gout.  Gastroesophageal reflux disease (GERD) GERD.  Prostate cancer Previous diagnosis with one positive biopsy out of twelve samples.  Bladder cancer Bladder cancer with previous removal of a lesion.  Esophageal stricture Esophageal stricture, previously treated with dilation.  Goals of Care He and his family are aware of the potential for cure and the importance of treatment to prevent disease progression. He prefers to continue treatment at the current facility due to satisfaction with the care team and convenience. He expressed hesitation about undergoing treatment due to fatigue but understands the necessity to prevent further complications.   The patient was advised to call immediately if he has any other concerning symptoms in the interval. The patient voices  understanding of current disease status and treatment options and is in agreement with the current care plan.  All questions were answered. The patient knows to call the clinic with any problems, questions or concerns. We can certainly see the patient much sooner if necessary.  Thank you so much for allowing me to participate in the care of Stephen Meza. I will continue to follow up the patient with you and assist in his care. The total time spent in the appointment was 90 minutes including review of chart and various tests results, discussions about plan of care and coordination of care plan .   Disclaimer: This note was dictated with voice recognition software. Similar sounding words can inadvertently be transcribed and may not be  corrected upon review.   Aurelio Blower August 12, 2023, 2:30 PM

## 2023-08-12 NOTE — Telephone Encounter (Signed)
 I spoke with the patient's wife about his PET scan.  They are on their way to [facility] to meet with Dr. Marguerita Shih to discuss chemoradiation.  His PET scan has not yet been read and we will communicate the results when available.

## 2023-08-12 NOTE — Progress Notes (Signed)
 START ON PATHWAY REGIMEN - Non-Small Cell Lung     A cycle is every 7 days, concurrent with RT:     Paclitaxel      Carboplatin   **Always confirm dose/schedule in your pharmacy ordering system**  Patient Characteristics: Preoperative or Nonsurgical Candidate (Clinical Staging), Stage IIB (N2a only) or Stage III - Nonsurgical Candidate, PS = 0,1 Therapeutic Status: Preoperative or Nonsurgical Candidate (Clinical Staging) AJCC T Category: cT3 AJCC N Category: cN2b AJCC M Category: cM0 AJCC 9 Stage Grouping: IIIB Check here if patient was staged using an edition other than AJCC Staging 9th Edition: false ECOG Performance Status: 1 Intent of Therapy: Curative Intent, Discussed with Patient

## 2023-08-13 ENCOUNTER — Encounter: Payer: Self-pay | Admitting: Internal Medicine

## 2023-08-13 ENCOUNTER — Other Ambulatory Visit: Payer: Self-pay

## 2023-08-13 DIAGNOSIS — I499 Cardiac arrhythmia, unspecified: Secondary | ICD-10-CM | POA: Diagnosis not present

## 2023-08-13 DIAGNOSIS — Z8551 Personal history of malignant neoplasm of bladder: Secondary | ICD-10-CM | POA: Diagnosis not present

## 2023-08-13 DIAGNOSIS — I10 Essential (primary) hypertension: Secondary | ICD-10-CM | POA: Diagnosis not present

## 2023-08-13 DIAGNOSIS — M109 Gout, unspecified: Secondary | ICD-10-CM | POA: Diagnosis not present

## 2023-08-13 DIAGNOSIS — G720 Drug-induced myopathy: Secondary | ICD-10-CM | POA: Diagnosis not present

## 2023-08-13 DIAGNOSIS — Z7902 Long term (current) use of antithrombotics/antiplatelets: Secondary | ICD-10-CM | POA: Diagnosis not present

## 2023-08-13 DIAGNOSIS — M4802 Spinal stenosis, cervical region: Secondary | ICD-10-CM | POA: Diagnosis not present

## 2023-08-13 DIAGNOSIS — M1990 Unspecified osteoarthritis, unspecified site: Secondary | ICD-10-CM | POA: Diagnosis not present

## 2023-08-13 DIAGNOSIS — Z87891 Personal history of nicotine dependence: Secondary | ICD-10-CM | POA: Diagnosis not present

## 2023-08-13 DIAGNOSIS — C3431 Malignant neoplasm of lower lobe, right bronchus or lung: Secondary | ICD-10-CM | POA: Diagnosis not present

## 2023-08-13 DIAGNOSIS — Z9181 History of falling: Secondary | ICD-10-CM | POA: Diagnosis not present

## 2023-08-13 DIAGNOSIS — R911 Solitary pulmonary nodule: Secondary | ICD-10-CM | POA: Diagnosis not present

## 2023-08-13 DIAGNOSIS — I69354 Hemiplegia and hemiparesis following cerebral infarction affecting left non-dominant side: Secondary | ICD-10-CM | POA: Diagnosis not present

## 2023-08-13 DIAGNOSIS — D519 Vitamin B12 deficiency anemia, unspecified: Secondary | ICD-10-CM | POA: Diagnosis not present

## 2023-08-13 NOTE — Progress Notes (Signed)
 I met the pt today at his medical oncology consult with Dr.Mohamed. The pt was accompanied by his dtr Stephen Meza, and wife Stephen Meza. Pts other dtr, Sherral Do was conferenced in via speaker phone.  Pt recently diagnosed with Stage IIIB squamous cell carcinoma. Plan for the pt is for concurrent chemoradiation . Pt saw radiation oncology on 6/17. Pt has CT simulation today after his consult with Dr.Mohamed. I quickly explained my position to the pt and his family as I escorted them to the pt's radiation oncology appt. I provided my contact information if the pt or his family have any questions or concerns.

## 2023-08-16 ENCOUNTER — Encounter: Payer: Self-pay | Admitting: Orthopedic Surgery

## 2023-08-16 ENCOUNTER — Other Ambulatory Visit (HOSPITAL_COMMUNITY)

## 2023-08-16 ENCOUNTER — Ambulatory Visit
Admission: RE | Admit: 2023-08-16 | Discharge: 2023-08-16 | Disposition: A | Discharge status: Home / Self Care | Source: Ambulatory Visit | Attending: Internal Medicine | Admitting: Internal Medicine

## 2023-08-16 ENCOUNTER — Telehealth: Payer: Self-pay | Admitting: Internal Medicine

## 2023-08-16 ENCOUNTER — Encounter: Payer: Self-pay | Admitting: Internal Medicine

## 2023-08-16 DIAGNOSIS — Z452 Encounter for adjustment and management of vascular access device: Secondary | ICD-10-CM | POA: Diagnosis not present

## 2023-08-16 DIAGNOSIS — C3431 Malignant neoplasm of lower lobe, right bronchus or lung: Secondary | ICD-10-CM

## 2023-08-16 DIAGNOSIS — C3491 Malignant neoplasm of unspecified part of right bronchus or lung: Secondary | ICD-10-CM | POA: Diagnosis not present

## 2023-08-16 HISTORY — PX: IR IMAGING GUIDED PORT INSERTION: IMG5740

## 2023-08-16 MED ORDER — DIPHENHYDRAMINE HCL 50 MG/ML IJ SOLN
INTRAMUSCULAR | Status: AC | PRN
Start: 2023-08-16 — End: 2023-08-16
  Administered 2023-08-16: 12.5 mg via INTRAVENOUS

## 2023-08-16 MED ORDER — LIDOCAINE-EPINEPHRINE 1 %-1:100000 IJ SOLN
20.0000 mL | Freq: Once | INTRAMUSCULAR | Status: AC
  Administered 2023-08-16: 20 mL via INTRADERMAL

## 2023-08-16 MED ORDER — MIDAZOLAM HCL 2 MG/2ML IJ SOLN
INTRAMUSCULAR | Status: AC | PRN
  Administered 2023-08-16: 1 mg via INTRAVENOUS

## 2023-08-16 MED ORDER — HEPARIN SOD (PORK) LOCK FLUSH 100 UNIT/ML IV SOLN
500.0000 [IU] | Freq: Once | INTRAVENOUS | Status: AC
  Administered 2023-08-16: 500 [IU]

## 2023-08-16 MED ORDER — SODIUM CHLORIDE 0.9 % IV SOLN: INTRAVENOUS | Status: DC

## 2023-08-16 MED ORDER — FENTANYL CITRATE (PF) 100 MCG/2ML IJ SOLN
INTRAMUSCULAR | Status: AC | PRN
  Administered 2023-08-16 (×2): 50 ug via INTRAVENOUS

## 2023-08-16 MED ORDER — FENTANYL CITRATE PF 50 MCG/ML IJ SOSY: 25.0000 ug | PREFILLED_SYRINGE | INTRAMUSCULAR | Status: DC | PRN

## 2023-08-16 MED ORDER — MIDAZOLAM HCL 2 MG/2ML IJ SOLN: 1.0000 mg | INTRAMUSCULAR | Status: DC | PRN

## 2023-08-16 NOTE — Procedures (Signed)
 Vascular and Interventional Radiology Procedure Note  Patient: Stephen Meza DOB: 01-06-1934 Medical Record Number: [id number] Note Date/Time: 08/16/23 9:07 AM   Performing Physician: Thom Hall, MD Assistant(s): None  Diagnosis: Lung cancer  Procedure: PORT PLACEMENT  Anesthesia: Conscious Sedation Complications: None Estimated Blood Loss: Minimal  Findings:  Successful left-sided port placement, with the tip of the catheter in the proximal right atrium.  Plan: Catheter ready for use.  See detailed procedure note with images in PACS. The patient tolerated the procedure well without incident or complication and was returned to Recovery in stable condition.    Thom Hall, MD [facility]   Pager. [phone] Clinic. [phone]

## 2023-08-16 NOTE — Discharge Instructions (Addendum)
 Implanted Port Insertion After Care     You may resume your Aspirin  and Plavix  today.  After the procedure, it is common to have discomfort at the port insertion site, as well as bruising on the skin over the port. This should improve over 3-4 days.    After your port is placed, you will get a manufacturer's information card. The card has information about your port. Keep this card with you at all times.      Follow instructions from your health care provider about how to take care of your port insertion site. Make sure you wash your hands with soap and water for at least 20 seconds before and after you change your bandage (dressing). If soap and water are not available, use hand sanitizer.  Leave your initial bandage on for a full 24 hours. After 24 hours you may remove the dressing and shower.  Do not scrub directly on the incision site but rather above it and let the soapy water run over the incision. Pat dry after. You may then opt to redress your incision for your comfort but you may just leave it open to air.  The Dermabond (surgical super glue) will protect your incision and keep it clean and dry. Leave the layer of skin glue in place. If the glue edges start to loosen and curl up, you may trim the loose edges but do not pull or pick at it. Do NOT apply neosporin or other antibacterial ointment to the surgical glue.  It will dissolve the glue and expose your new incision to possible infection.  Do NOT apply EMLA numbing cream to the surgical glue.  It will dissolve the glue and expose your new incision to possible infection.  You may have to wait to use the EMLA cream until your incision has healed.    Return to your normal activities as told by your health care provider. Ask your health care provider what activities are safe for you. You may resume over-the-counter and prescription medicines as prescribed by your health care provider. Do not take baths, swim, or use a hot tub until your incision  has healed completely (usually 2 weeks).     You were given a sedative during the procedure, and itcan affect you for several hours. Do not drive, operate machinery or sign important documents for 24 hours after your procedure.     Please contact our office at [phone] and ask to speak with the nurse if you have any signs of an infection, such as fever or chills, redness, swelling, or pain around your port insertion site, fluid or blood coming from your port insertion site, if your port insertion site feels warm to the touch and/or if you have pus or a bad smell coming from the port insertion site.       If you need to speak to someone after hours, please call the [facility] on-call service at [phone] and tell them you are a patient of Dr. Thelda and you had a portacath placed today, as well as any issues you are having.    Thank you for visiting [facility] today!

## 2023-08-16 NOTE — Telephone Encounter (Signed)
 Confirmed appointment with the patients wife.

## 2023-08-16 NOTE — H&P (Signed)
 Chief Complaint: Patient was seen in consultation today for port placement   Referring Physician(s): Mohamed,Mohamed  Supervising Physician: Hughes Simmonds  Patient Status: Stephen Meza  History of Present Illness: Stephen Meza is a 89 y.o. male with newly diagnosed lung cancer. He is set to begin chemotherapy soon and is referred for port placement.  PMHx, meds, labs, imaging, allergies reviewed. Feels well, no recent fevers, chills, illness. Has been NPO today as directed. Family at bedside.   Past Medical History:  Diagnosis Date   Bladder cancer (HCC) 03/19/2016   DUODENITIS, WITHOUT HEMORRHAGE 07/14/2007   ERECTILE DYSFUNCTION 07/14/2007   Esophageal stricture 03/19/2016   ESOPHAGITIS 07/14/2007   EXTERNAL HEMORRHOIDS 07/14/2007   GERD 07/14/2007   Gout    HYPERTENSION 07/14/2007   PERIPHERAL NEUROPATHY 07/14/2007   Peripheral neuropathy 04/06/2019   PERIPHERAL VASCULAR DISEASE 07/14/2007   PODAGRA 07/14/2007   Prostate cancer (HCC) 02/2012   pt had screening and then biopsy   Prostate cancer (HCC) 04/13/2012   Mild rise in PSA lead to prostate biopsy - 1:11 positive for adenocarcinoma. CT abd/pelvis was negative except for enlarged prostate.     Treatment - hormonal     Retinal tear of right eye 03/19/2016   Stroke (HCC) 05/09/2023   TOBACCO ABUSE, HX OF 02/12/2010   TONSILLECTOMY, HX OF 07/14/2007   TREMOR, ESSENTIAL 07/14/2007   VITAMIN B12 DEFICIENCY 07/14/2007    Past Surgical History:  Procedure Laterality Date   BRONCHIAL BIOPSY  07/26/2023   Procedure: BRONCHOSCOPY, WITH BIOPSY;  Surgeon: [name], MD;  Location: [facility] ENDOSCOPY;  Service: Pulmonary;;   BRONCHIAL BRUSHINGS  07/26/2023   Procedure: BRONCHOSCOPY, WITH BRUSH BIOPSY;  Surgeon: [name], MD;  Location: [facility] ENDOSCOPY;  Service: Pulmonary;;   Excision of lipoma     PROSTATE BIOPSY Bilateral 03/10/2012   surgery to reduce turbinate and straighten deviated septum  2016    TONSILLECTOMY     TRANSCAROTID ARTERY REVASCULARIZATION  Right 05/14/2023   Procedure: TRANSCAROTID ARTERY REVASCULARIZATION (TCAR) UISNG 10mm X 40mm ENROUTE TRANSCAROTID STENT SYSTEM;  Surgeon: Lanis Fonda BRAVO, MD;  Location: [facility] OR;  Service: Vascular;  Laterality: Right;   VIDEO BRONCHOSCOPY  07/26/2023   Procedure: VIDEO BRONCHOSCOPY WITHOUT FLUORO;  Surgeon: [name], MD;  Location: [facility] ENDOSCOPY;  Service: Pulmonary;;    Allergies: Ciprofloxacin hcl, Statins, and Nitrofuran derivatives  Medications: Prior to Admission medications   Medication Sig Start Date End Date Taking? Authorizing Provider  albuterol  (VENTOLIN  HFA) 108 (90 Base) MCG/ACT inhaler Take 1 puff as needed for shortness of breath or wheezing, 08/03/23   Ruthell Lauraine FALCON, NP  allopurinol  (ZYLOPRIM ) 100 MG tablet TAKE 1 TABLET BY MOUTH EVERY DAY 07/12/23   Harden Jerona GAILS, MD  aspirin  EC 81 MG tablet Take 1 tablet (81 mg total) by mouth daily. Okay to restart on 07/27/2023.  Swallow whole. 07/26/23   [name], MD  benzonatate  (TESSALON ) 100 MG capsule TAKE 1 CAPSULE (100 MG TOTAL) BY MOUTH EVERY 6 (SIX) HOURS AS NEEDED FOR COUGH 07/22/23 07/21/24  Ruthell Lauraine FALCON, NP  carvedilol  (COREG ) 3.125 MG tablet TAKE 1 TABLET BY MOUTH TWICE A DAY WITH A MEAL 07/28/23   [name], FNP  clopidogrel  (PLAVIX ) 75 MG tablet Take 1 tablet (75 mg total) by mouth daily. Okay to restart on 07/27/2023 07/26/23 10/24/23  [name], MD  Cyanocobalamin  (B-12) 3000 MCG CAPS Take 3,000 mcg by mouth daily.    [provider]  diclofenac  Sodium (VOLTAREN ) 1 % GEL Apply 4 g topically 4 (four) times daily. 06/09/23   [name], FNP  docusate sodium  (COLACE) 100 MG capsule Take 100 mg by mouth 2 (two) times daily. 07/12/23   [provider]  ezetimibe  (ZETIA ) 10 MG tablet TAKE 1 TABLET BY MOUTH EVERY DAY 07/02/23   [name], FNP  omeprazole  (PRILOSEC) 40 MG capsule TAKE 1 CAPSULE BY MOUTH EVERY DAY  03/31/23   Norleen Lynwood ORN, MD  polyethylene glycol (MIRALAX ) 17 g packet Take 17 g by mouth daily as needed for moderate constipation or severe constipation (not helped with stool softner). 05/15/23   Pokhrel, Vernal, MD     Family History  Problem Relation Age of Onset   Hypothyroidism Daughter    Breast cancer Other    Cancer Mother    Other Father    Breast cancer Sister     Social History   Socioeconomic History   Marital status: Married    Spouse name: [place]   Number of children: 2   Years of education: 12   Highest education level: Not on file  Occupational History   Occupation: retired    Associate Professor: RETIRED  Tobacco Use   Smoking status: Former    Current packs/day: 0.75    Average packs/day: 0.7 packs/day for 29.5 years (22.1 ttl pk-yrs)    Types: Cigarettes    Start date: 02/23/1994   Smokeless tobacco: Never  Vaping Use   Vaping status: Never Used  Substance and Sexual Activity   Alcohol use: Not Currently   Drug use: No   Sexual activity: Yes    Partners: Female  Other Topics Concern   Not on file  Social History Narrative   HSG. Married '1958. 2 dtrs; 4 g-children. Work - retired. 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       Not disabled      Retired since 1986      Right handed      Two story home with wife/2025   Social Drivers of Health   Financial Resource Strain: Low Risk  (07/07/2023)   Overall Financial Resource Strain (CARDIA)    Difficulty of Paying Living Expenses: Not very hard  Food Insecurity: No Food Insecurity (08/12/2023)   Hunger Vital Sign    Worried About Running Out of Food in the Last Year: Never true    Ran Out of Food in the Last Year: Never true  Transportation Needs: No Transportation Needs (08/12/2023)   PRAPARE - Administrator, Civil Service (Medical): No    Lack of Transportation (Non-Medical): No  Physical Activity: Sufficiently Active  (07/07/2023)   Exercise Vital Sign    Days of Exercise per Week: 5 days    Minutes of Exercise per Session: 40 min  Stress: No Stress Concern Present (07/07/2023)   [employer] of Occupational Health - Occupational Stress Questionnaire    Feeling of Stress : Not at all  Social Connections: Moderately Integrated (07/07/2023)   Social Connection and Isolation Panel    Frequency of Communication with Friends and Family: Once a week    Frequency of Social Gatherings with Friends and Family: Once a week    Attends Religious Services: More than 4 times per year    Active Member of [employer] or Organizations: Yes    Attends Banker Meetings: Never    Marital  Status: Married    Review of Systems: A 12 point ROS discussed and pertinent positives are indicated in the HPI above.  All other systems are negative.  Review of Systems  Vital Signs: BP (!) 168/77 (BP Location: Left Arm, Patient Position: Sitting, Cuff Size: Normal)   Pulse 81   Temp 98.9 F (37.2 C)   Resp 16   SpO2 96%   Physical Exam Constitutional:      Appearance: He is not ill-appearing.  HENT:     Mouth/Throat:     Mouth: Mucous membranes are moist.     Pharynx: Oropharynx is clear.   Cardiovascular:     Rate and Rhythm: Normal rate and regular rhythm.     Heart sounds: Normal heart sounds.  Pulmonary:     Effort: Pulmonary effort is normal. No respiratory distress.     Breath sounds: Normal breath sounds.   Skin:    General: Skin is warm and dry.   Neurological:     General: No focal deficit present.     Mental Status: He is alert and oriented to person, place, and time.   Psychiatric:        Mood and Affect: Mood normal.        Thought Content: Thought content normal.     Imaging: NM PET Image Restag (PS) Skull Base To Thigh Result Date: 08/11/2023 CLINICAL DATA:  Subsequent treatment strategy for non-small cell lung cancer. Treatment after initial staging interrupted by stroke. Restaging  needed. EXAM: NUCLEAR MEDICINE PET SKULL BASE TO THIGH TECHNIQUE: 7.59 mCi F-18 FDG was injected intravenously. Full-ring PET imaging was performed from the skull base to thigh after the radiotracer. CT data was obtained and used for attenuation correction and anatomic localization. Fasting blood glucose: 103 mg/dl COMPARISON:  PET-CT 97/72/7974.  Chest CT 07/10/2023. FINDINGS: Mediastinal blood pool activity: SUV max 2.8 NECK: No hypermetabolic cervical lymph nodes are identified. No suspicious activity identified within the pharyngeal mucosal space. Incidental CT findings: Bilateral carotid atherosclerosis with a probable right carotid stent. CHEST: Large hypermetabolic right hilar mass is again noted, slightly enlarged from the previous PET-CT. This currently measures 8.4 x 6.2 cm on image 40/7 (previously 7.6 x 5.6 cm). There is fairly homogeneous hypermetabolic activity within this lesion with an SUV max of 22.3 (previously 26.2). As before, this mass involves all 3 lobes and partially encases the right upper, middle and lower lobe bronchi which remain patent. No peripheral hypermetabolic pulmonary activity or other suspicious pulmonary nodularity. No significant changes small subcarinal node measuring 7 mm short axis on image 74/4 with an SUV max of 5.2. No other separate hypermetabolic mediastinal lymph nodes. Incidental CT findings: Atherosclerosis of the aorta, great vessels and coronary arteries. Mild emphysema with scattered subpleural reticulation in both lungs and mild asymmetric right upper lobe scarring. No lobar collapse. ABDOMEN/PELVIS: Similar small mildly hypermetabolic left adrenal nodule measuring 2.1 x 1.4 cm with an SUV max of 5.6 (previously 4.5). This nodule measures 27 HU, although is similar in size to prior CT's dating back to 04/27/2017, favoring an adenoma. No hypermetabolic activity within the right adrenal gland, liver, pancreas or spleen. There is no hypermetabolic nodal activity in  the abdomen or pelvis. Incidental CT findings: Contrast material within the renal collecting systems and bladder from preceding brain MRI. Aortic and branch vessel atherosclerosis with a 4.0 x 4.7 cm infrarenal abdominal aortic aneurysm, unchanged from previous PET-CT (remeasured). Treatment changes within the prostate gland. The urinary bladder is trabeculated. SKELETON:  There is no hypermetabolic activity to suggest osseous metastatic disease. Incidental CT findings: Multilevel spondylosis. Grossly stable chronic compression deformities at L1 and L2, without hypermetabolic activity. IMPRESSION: 1. Little overall change compared with previous PET-CT of 04/22/2023. The large hypermetabolic right hilar mass has slightly enlarged in the interval with similar intense hypermetabolic activity, consistent with known primary bronchogenic carcinoma. 2. Stable small hypermetabolic subcarinal lymph node, suspicious for nodal metastasis. 3. Similar small mildly hypermetabolic left adrenal nodule, indeterminate although likely an adenoma based on stability from remote CT's. 4. No other evidence of distant metastatic disease. 5. Stable infrarenal abdominal aortic aneurysm measuring 4.7 cm. Recommend CTA or MRA, as appropriate, in 12 months and referral to vascular specialist. Reference: Journal of Vascular Surgery 67.1 (2018): 2-77. J Am Coll Radiol 2013;10:789-794. 6. Aortic Atherosclerosis (ICD10-I70.0) and Emphysema (ICD10-J43.9). Electronically Signed   By: Elsie Perone M.D.   On: 08/11/2023 18:18   DG Shoulder Right Result Date: 07/29/2023 CLINICAL DATA:  Right shoulder pain for several months. EXAM: RIGHT SHOULDER - 2+ VIEW COMPARISON:  None Available. FINDINGS: There is no evidence of fracture or dislocation. Minimal degenerative changes seen involving the right glenohumeral joint. Soft tissues are unremarkable. IMPRESSION: Minimal osteoarthritis of right glenohumeral joint. No acute abnormality seen. Electronically  Signed   By: Lynwood Landy Raddle M.D.   On: 07/29/2023 14:50   DG C-Arm 1-60 Min-No Report Result Date: 07/26/2023 Fluoroscopy was utilized by the requesting physician.  No radiographic interpretation.    Labs:  CBC: Recent Labs    05/15/23 0510 05/21/23 1448 07/26/23 1045 08/12/23 1351  WBC 7.9 8.7 7.6 9.9  HGB 9.6* 11.8* 11.4* 11.5*  HCT 30.3* 36.2* 36.8* 36.6*  PLT 217 338.0 253 296    COAGS: Recent Labs    05/11/23 0506 05/14/23 0900  INR 1.0 1.0  APTT  --  28    BMP: Recent Labs    05/14/23 0758 05/15/23 0510 05/21/23 1448 07/26/23 1045 08/12/23 1351  NA 136 135 136 135 136  K 3.9 3.7 4.7 4.5 4.3  CL 102 102 99 101 100  CO2 25 24 30 25 30   GLUCOSE 103* 103* 107* 104* 134*  BUN 14 16 20 15  31*  CALCIUM  9.1 8.8* 10.0 9.1 9.7  CREATININE 0.93 0.96 0.81 0.79 0.74  GFRNONAA >60 >60  --  >60 >60    LIVER FUNCTION TESTS: Recent Labs    05/09/23 1654 05/10/23 0939 05/21/23 1448 08/12/23 1351  BILITOT 0.7 0.7 0.5 0.4  AST 19 24 18 16   ALT 9 11 8 17   ALKPHOS 80 78 77 73  PROT 8.2* 8.2* 8.1 7.6  ALBUMIN 3.9 3.8 4.3 3.9     Assessment and Plan: Lung cancer For port placement Risks and benefits of image guided port-a-catheter placement was discussed with the patient including, but not limited to bleeding, infection, pneumothorax, or fibrin sheath development and need for additional procedures.  All of the patient's questions were answered, patient is agreeable to proceed. Consent signed and in chart.    Electronically Signed: Franky Rusk, PA-C 08/16/2023, 8:53 AM   I spent a total of 20 minutes in face to face in clinical consultation, greater than 50% of which was counseling/coordinating care for port

## 2023-08-16 NOTE — Progress Notes (Signed)
 Office Visit Note   Patient: Stephen Meza           Date of Birth: September 08, 1933           MRN: [id number] Visit Date: 08/10/2023              Requested by: [name], FNP [address] [place],  [place] [address] PCP: [name], FNP  Chief Complaint  Patient presents with   Right Foot - Follow-up   Left Foot - Follow-up      HPI: Patient is a 89 year old gentleman who is seen for venous insufficiency as well as onychomycotic nails x 10.  Patient also presents with a hypertrophic callus beneath the fifth metatarsal head right foot.  Assessment & Plan: Visit Diagnoses:  1. Onychomycosis   2. Venous stasis dermatitis of both lower extremities     Plan: Callus was pared and nails were trimmed x 10.  Follow-Up Instructions: Return in about 3 months (around 11/10/2023).   Ortho Exam  Patient is alert, oriented, no adenopathy, well-dressed, normal affect, normal respiratory effort. Examination patient has a hypertrophic callus beneath the fifth metatarsal head right foot.  After informed consent the callus was pared there was no deep ulcers.  Patient has had a stroke since his last exam.  Of note patient also has a mass detected on his right lung.  Patient has thickened discolored onychomycotic nails x 10 that he is unable to safely trim on his own.  Nails were trimmed x 10 without complication.    Imaging: No results found. No images are attached to the encounter.  Labs: Lab Results  Component Value Date   HGBA1C 5.5 05/10/2023   HGBA1C 6.0 11/06/2022   HGBA1C 5.8 05/08/2022   ESRSEDRATE 19 08/01/2018   LABURIC 5.0 11/28/2012   LABURIC 6.4 03/10/2011   LABURIC 6.8 02/12/2010     Lab Results  Component Value Date   ALBUMIN 3.9 08/12/2023   ALBUMIN 4.3 05/21/2023   ALBUMIN 3.8 05/10/2023    Lab Results  Component Value Date   MG 2.2 05/21/2023   MG 2.0 05/14/2023   MG 2.2 05/11/2023   Lab Results  Component Value  Date   VD25OH 61.85 11/06/2022   VD25OH 46.83 05/08/2022   VD25OH 47.18 04/22/2021    No results found for: PREALBUMIN    Latest Ref Rng & Units 08/12/2023    1:51 PM 07/26/2023   10:45 AM 05/21/2023    2:48 PM  CBC EXTENDED  WBC 4.0 - 10.5 K/uL 9.9  7.6  8.7   RBC 4.22 - 5.81 MIL/uL 4.26  4.15  4.06   Hemoglobin 13.0 - 17.0 g/dL 88.4  88.5  88.1   HCT 39.0 - 52.0 % 36.6  36.8  36.2   Platelets 150 - 400 K/uL 296  253  338.0   NEUT# 1.7 - 7.7 K/uL 7.8   6.9   Lymph# 0.7 - 4.0 K/uL 1.0   1.0      There is no height or weight on file to calculate BMI.  Orders:  No orders of the defined types were placed in this encounter.  No orders of the defined types were placed in this encounter.    Procedures: No procedures performed  Clinical Data: No additional findings.  ROS:  All other systems negative, except as noted in the HPI. Review of Systems  Objective: Vital Signs: There were no vitals taken for this visit.  Specialty Comments:  No specialty comments available.  PMFS History: Patient Active Problem List   Diagnosis Date Noted   Malignant neoplasm of lower lobe of right lung (HCC) 08/08/2023   Weight loss 07/29/2023   Chronic right shoulder pain 07/29/2023   Lung nodule seen on imaging study 07/22/2023   Prediabetes 06/29/2023   Medication management 06/29/2023   Statin myopathy 06/18/2023   Chronic constipation 06/18/2023   Primary osteoarthritis of both knees 06/18/2023   Dysuria 06/18/2023   Chronic pain of both knees 06/18/2023   History of stroke 06/18/2023   Generalized weakness 06/18/2023   Acute CVA (cerebrovascular accident) (HCC) 05/10/2023   History of gout 05/10/2023   Stroke (HCC) 05/10/2023   Abnormal breath sounds 03/29/2023   Mass of right lung 03/22/2023   Irregular heart rate 11/10/2022   Anemia 05/03/2021   Tick bite, infected 08/07/2020   Peripheral neuropathy 04/06/2019   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/06/2019   Low grade fever 04/06/2019   Bilateral leg pain 04/04/2018   Bilateral bunions 04/04/2018   Ganglion cyst of dorsum of right wrist 04/04/2018   Chronic low back pain 04/04/2018   Gait disorder 04/04/2018   Recurrent falls while walking 04/04/2018   Abrasion of knee, bilateral 04/04/2018   Esophageal stricture 03/19/2016   Retinal tear of right eye 03/19/2016   Dizziness 03/19/2016   Hyperglycemia 03/16/2015   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   Mixed hyperlipidemia 04/12/2012   Encounter for well adult exam with abnormal findings 03/11/2011   TOBACCO ABUSE, HX OF 02/12/2010   SHOULDER PAIN 10/10/2007   Vitamin B12 deficiency 07/14/2007   PODAGRA 07/14/2007   ERECTILE DYSFUNCTION 07/14/2007   TREMOR, ESSENTIAL 07/14/2007   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   Essential hypertension 07/14/2007   PERIPHERAL VASCULAR DISEASE 07/14/2007   External hemorrhoids 07/14/2007   Esophagitis 07/14/2007   GERD 07/14/2007   TONSILLECTOMY, HX OF 07/14/2007   Past Medical History:  Diagnosis Date   Bladder cancer (HCC) 03/19/2016   DUODENITIS, WITHOUT HEMORRHAGE 07/14/2007   ERECTILE DYSFUNCTION 07/14/2007   Esophageal stricture 03/19/2016   ESOPHAGITIS 07/14/2007   EXTERNAL HEMORRHOIDS 07/14/2007   GERD 07/14/2007   Gout    HYPERTENSION 07/14/2007   PERIPHERAL NEUROPATHY 07/14/2007   Peripheral neuropathy 04/06/2019   PERIPHERAL VASCULAR DISEASE 07/14/2007   PODAGRA 07/14/2007   Prostate cancer (HCC) 02/2012   pt had screening and then biopsy   Prostate cancer (HCC) 04/13/2012   Mild rise in PSA lead to prostate biopsy - 1:11 positive for adenocarcinoma. CT abd/pelvis was negative except for enlarged prostate.     Treatment - hormonal     Retinal tear of right eye 03/19/2016   Stroke (HCC) 05/09/2023   TOBACCO ABUSE, HX OF 02/12/2010   TONSILLECTOMY, HX OF 07/14/2007   TREMOR, ESSENTIAL 07/14/2007   VITAMIN B12 DEFICIENCY  07/14/2007    Family History  Problem Relation Age of Onset   Hypothyroidism Daughter    Breast cancer Other    Cancer Mother    Other Father    Breast cancer Sister     Past Surgical History:  Procedure Laterality Date   BRONCHIAL BIOPSY  07/26/2023   Procedure: BRONCHOSCOPY, WITH BIOPSY;  Surgeon: [name], MD;  Location: [facility] ENDOSCOPY;  Service: Pulmonary;;   BRONCHIAL BRUSHINGS  07/26/2023   Procedure: BRONCHOSCOPY, WITH BRUSH BIOPSY;  Surgeon: [name], MD;  Location: [facility] ENDOSCOPY;  Service: Pulmonary;;   Excision of lipoma  PROSTATE BIOPSY Bilateral 03/10/2012   surgery to reduce turbinate and straighten deviated septum  2016   TONSILLECTOMY     TRANSCAROTID ARTERY REVASCULARIZATION  Right 05/14/2023   Procedure: TRANSCAROTID ARTERY REVASCULARIZATION (TCAR) UISNG 10mm X 40mm ENROUTE TRANSCAROTID STENT SYSTEM;  Surgeon: Lanis Fonda BRAVO, MD;  Location: [facility] OR;  Service: Vascular;  Laterality: Right;   VIDEO BRONCHOSCOPY  07/26/2023   Procedure: VIDEO BRONCHOSCOPY WITHOUT FLUORO;  Surgeon: [name], MD;  Location: [facility] ENDOSCOPY;  Service: Pulmonary;;   Social History   Occupational History   Occupation: retired    Associate Professor: RETIRED  Tobacco Use   Smoking status: Former    Current packs/day: 0.75    Average packs/day: 0.7 packs/day for 29.5 years (22.1 ttl pk-yrs)    Types: Cigarettes    Start date: 02/23/1994   Smokeless tobacco: Never  Vaping Use   Vaping status: Never Used  Substance and Sexual Activity   Alcohol use: Not Currently   Drug use: No   Sexual activity: Yes    Partners: Female

## 2023-08-16 NOTE — Progress Notes (Signed)
 Pharmacist Chemotherapy Monitoring - Initial Assessment    Anticipated start date: 08/23/23   The following has been reviewed per standard work regarding the patient's treatment regimen: The patient's diagnosis, treatment plan and drug doses, and organ/hematologic function Lab orders and baseline tests specific to treatment regimen  The treatment plan start date, drug sequencing, and pre-medications Prior authorization status  Patient's documented medication list, including drug-drug interaction screen and prescriptions for anti-emetics and supportive care specific to the treatment regimen The drug concentrations, fluid compatibility, administration routes, and timing of the medications to be used The patient's access for treatment and lifetime cumulative dose history, if applicable  The patient's medication allergies and previous infusion related reactions, if applicable   Changes made to treatment plan:  N/A  Follow up needed:  Pending authorization for treatment  and prescriptions needed for anti-emetics   Amariana Mirando, Pharm.D., CPP 08/16/2023@4 :09 PM

## 2023-08-17 ENCOUNTER — Inpatient Hospital Stay

## 2023-08-17 ENCOUNTER — Other Ambulatory Visit: Payer: Self-pay | Admitting: Medical Oncology

## 2023-08-17 ENCOUNTER — Inpatient Hospital Stay: Admitting: Internal Medicine

## 2023-08-17 ENCOUNTER — Other Ambulatory Visit: Payer: Self-pay | Admitting: Acute Care

## 2023-08-17 VITALS — BP 119/61 | HR 101 | Temp 97.3°F | Resp 18 | Wt 155.3 lb

## 2023-08-17 DIAGNOSIS — C3411 Malignant neoplasm of upper lobe, right bronchus or lung: Secondary | ICD-10-CM | POA: Diagnosis not present

## 2023-08-17 DIAGNOSIS — Z510 Encounter for antineoplastic radiation therapy: Secondary | ICD-10-CM | POA: Diagnosis not present

## 2023-08-17 DIAGNOSIS — G629 Polyneuropathy, unspecified: Secondary | ICD-10-CM | POA: Diagnosis not present

## 2023-08-17 DIAGNOSIS — J069 Acute upper respiratory infection, unspecified: Secondary | ICD-10-CM

## 2023-08-17 DIAGNOSIS — Z5111 Encounter for antineoplastic chemotherapy: Secondary | ICD-10-CM | POA: Diagnosis not present

## 2023-08-17 DIAGNOSIS — M109 Gout, unspecified: Secondary | ICD-10-CM | POA: Diagnosis not present

## 2023-08-17 DIAGNOSIS — Z8551 Personal history of malignant neoplasm of bladder: Secondary | ICD-10-CM | POA: Diagnosis not present

## 2023-08-17 DIAGNOSIS — Z95828 Presence of other vascular implants and grafts: Secondary | ICD-10-CM

## 2023-08-17 DIAGNOSIS — K222 Esophageal obstruction: Secondary | ICD-10-CM | POA: Diagnosis not present

## 2023-08-17 DIAGNOSIS — K2100 Gastro-esophageal reflux disease with esophagitis, without bleeding: Secondary | ICD-10-CM | POA: Diagnosis not present

## 2023-08-17 DIAGNOSIS — I63231 Cerebral infarction due to unspecified occlusion or stenosis of right carotid arteries: Secondary | ICD-10-CM

## 2023-08-17 DIAGNOSIS — C3431 Malignant neoplasm of lower lobe, right bronchus or lung: Secondary | ICD-10-CM

## 2023-08-17 DIAGNOSIS — R051 Acute cough: Secondary | ICD-10-CM

## 2023-08-17 DIAGNOSIS — I10 Essential (primary) hypertension: Secondary | ICD-10-CM | POA: Diagnosis not present

## 2023-08-17 MED ORDER — ONDANSETRON 4 MG PO TBDP: 4.0000 mg | ORAL_TABLET | Freq: Three times a day (TID) | ORAL | 0 refills | Status: DC | PRN

## 2023-08-17 MED ORDER — LIDOCAINE-PRILOCAINE 2.5-2.5 % EX CREA: 1.0000 | TOPICAL_CREAM | CUTANEOUS | 0 refills | Status: DC | PRN

## 2023-08-17 NOTE — Progress Notes (Signed)
 [facility] [address]  [place], [place] [address] [phone]   New Patient Evaluation  Date of Service: 08/17/23 Patient Name: Stephen Meza Patient MRN: [id number] Patient DOB: 1933/07/28 Provider: Arthea MARLA Manns, MD  Identifying Statement:  Stephen Meza is a 89 y.o. male with Cerebrovascular accident (CVA) due to stenosis of right carotid artery (HCC) who presents for initial consultation and evaluation regarding cancer associated neurologic deficits.    Referring Provider: [name], FNP [address] [place],  [place] [address]  Primary Cancer:  Oncologic History: Oncology History  Malignant neoplasm of lower lobe of right lung (HCC)  08/08/2023 Initial Diagnosis   Malignant neoplasm of lower lobe of right lung (HCC)   08/12/2023 Cancer Staging   Staging form: Lung, AJCC V9 - Clinical: Stage IIIB (cT3, cN2b, cM0) - Signed by Sherrod Sherrod, MD on 08/12/2023 Method of lymph node assessment: Clinical   08/23/2023 -  Chemotherapy   Patient is on Treatment Plan : LUNG Carboplatin + Paclitaxel + XRT q7d       History of Present Illness: The patient's records from the referring physician were obtained and reviewed and the patient interviewed to confirm this HPI.  [name] presents today for evaluation following recent MRI brain.  He describes recovery of most of his sensory impairment and a good amount of weakness following his stroke and stent placement this past March.  He is now walking with a walker, before stroke had been using a cane.  He has had some dizzy spells provoked by standing and walking, no LOC.  Denies seizures, headaches.  Plans to initiate chemo-radiotherapy for NSCLC next week, [place] and [place].  Medications: Current Outpatient Medications on File Prior to Visit  Medication Sig Dispense Refill   albuterol  (VENTOLIN  HFA) 108 (90 Base) MCG/ACT inhaler Take 1  puff as needed for shortness of breath or wheezing, 8.5 g 2   allopurinol  (ZYLOPRIM ) 100 MG tablet TAKE 1 TABLET BY MOUTH EVERY DAY 90 tablet 4   aspirin  EC 81 MG tablet Take 1 tablet (81 mg total) by mouth daily. Okay to restart on 07/27/2023.  Swallow whole.     benzonatate  (TESSALON ) 100 MG capsule TAKE 1 CAPSULE (100 MG TOTAL) BY MOUTH EVERY 6 (SIX) HOURS AS NEEDED FOR COUGH 30 capsule 0   carvedilol  (COREG ) 3.125 MG tablet TAKE 1 TABLET BY MOUTH TWICE A DAY WITH A MEAL 180 tablet 1   clopidogrel  (PLAVIX ) 75 MG tablet Take 1 tablet (75 mg total) by mouth daily. Okay to restart on 07/27/2023     Cyanocobalamin  (B-12) 3000 MCG CAPS Take 3,000 mcg by mouth daily.     diclofenac  Sodium (VOLTAREN ) 1 % GEL Apply 4 g topically 4 (four) times daily. 150 g 3   docusate sodium  (COLACE) 100 MG capsule Take 100 mg by mouth 2 (two) times daily.     ezetimibe  (ZETIA ) 10 MG tablet TAKE 1 TABLET BY MOUTH EVERY DAY 90 tablet 1   omeprazole  (PRILOSEC) 40 MG capsule TAKE 1 CAPSULE BY MOUTH EVERY DAY 90 capsule 2   polyethylene glycol (MIRALAX ) 17 g packet Take 17 g by mouth daily as needed for moderate constipation or severe constipation (not helped with stool softner). 10 each 0   No current facility-administered medications on file prior to visit.    Allergies:  Allergies  Allergen Reactions   Ciprofloxacin Hcl Other (See Comments)    numbness  Statins Other (See Comments)    Intolerance, joint and muscle pain   Nitrofuran Derivatives Rash   Past Medical History:  Past Medical History:  Diagnosis Date   Bladder cancer (HCC) 03/19/2016   DUODENITIS, WITHOUT HEMORRHAGE 07/14/2007   ERECTILE DYSFUNCTION 07/14/2007   Esophageal stricture 03/19/2016   ESOPHAGITIS 07/14/2007   EXTERNAL HEMORRHOIDS 07/14/2007   GERD 07/14/2007   Gout    HYPERTENSION 07/14/2007   PERIPHERAL NEUROPATHY 07/14/2007   Peripheral neuropathy 04/06/2019   PERIPHERAL VASCULAR DISEASE 07/14/2007   PODAGRA 07/14/2007    Prostate cancer (HCC) 02/2012   pt had screening and then biopsy   Prostate cancer (HCC) 04/13/2012   Mild rise in PSA lead to prostate biopsy - 1:11 positive for adenocarcinoma. CT abd/pelvis was negative except for enlarged prostate.     Treatment - hormonal     Retinal tear of right eye 03/19/2016   Stroke (HCC) 05/09/2023   TOBACCO ABUSE, HX OF 02/12/2010   TONSILLECTOMY, HX OF 07/14/2007   TREMOR, ESSENTIAL 07/14/2007   VITAMIN B12 DEFICIENCY 07/14/2007   Past Surgical History:  Past Surgical History:  Procedure Laterality Date   BRONCHIAL BIOPSY  07/26/2023   Procedure: BRONCHOSCOPY, WITH BIOPSY;  Surgeon: [name], MD;  Location: [facility] ENDOSCOPY;  Service: Pulmonary;;   BRONCHIAL BRUSHINGS  07/26/2023   Procedure: BRONCHOSCOPY, WITH BRUSH BIOPSY;  Surgeon: [name], MD;  Location: [facility] ENDOSCOPY;  Service: Pulmonary;;   Excision of lipoma     IR IMAGING GUIDED PORT INSERTION  08/16/2023   PROSTATE BIOPSY Bilateral 03/10/2012   surgery to reduce turbinate and straighten deviated septum  2016   TONSILLECTOMY     TRANSCAROTID ARTERY REVASCULARIZATION  Right 05/14/2023   Procedure: TRANSCAROTID ARTERY REVASCULARIZATION (TCAR) UISNG 10mm X 40mm ENROUTE TRANSCAROTID STENT SYSTEM;  Surgeon: Lanis Fonda BRAVO, MD;  Location: [facility] OR;  Service: Vascular;  Laterality: Right;   VIDEO BRONCHOSCOPY  07/26/2023   Procedure: VIDEO BRONCHOSCOPY WITHOUT FLUORO;  Surgeon: [name], MD;  Location: [facility] ENDOSCOPY;  Service: Pulmonary;;   Social History:  Social History   Socioeconomic History   Marital status: Married    Spouse name: Glendale   Number of children: 2   Years of education: 12   Highest education level: Not on file  Occupational History   Occupation: retired    Associate Professor: RETIRED  Tobacco Use   Smoking status: Former    Current packs/day: 0.75    Average packs/day: 0.8 packs/day for 29.5 years (22.1 ttl pk-yrs)    Types: Cigarettes    Start date: 02/23/1994   Smokeless  tobacco: Never  Vaping Use   Vaping status: Never Used  Substance and Sexual Activity   Alcohol use: Not Currently   Drug use: No   Sexual activity: Yes    Partners: Female  Other Topics Concern   Not on file  Social History Narrative   HSG. Married '1958. 2 dtrs; 4 g-children. Work - retired. 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       Not disabled      Retired since 1986      Right handed      Two story home with wife/2025   Social Drivers of Health   Financial Resource Strain: Low Risk  (07/07/2023)   Overall Financial Resource Strain (CARDIA)    Difficulty of Paying Living Expenses: Not very hard  Food Insecurity: Unknown (08/17/2023)  Hunger Vital Sign    Worried About Running Out of Food in the Last Year: Never true    Ran Out of Food in the Last Year: Not on file  Transportation Needs: No Transportation Needs (08/17/2023)   PRAPARE - Administrator, Civil Service (Medical): No    Lack of Transportation (Non-Medical): No  Physical Activity: Sufficiently Active (07/07/2023)   Exercise Vital Sign    Days of Exercise per Week: 5 days    Minutes of Exercise per Session: 40 min  Stress: No Stress Concern Present (07/07/2023)   [employer] of Occupational Health - Occupational Stress Questionnaire    Feeling of Stress : Not at all  Social Connections: Moderately Integrated (07/07/2023)   Social Connection and Isolation Panel    Frequency of Communication with Friends and Family: Once a week    Frequency of Social Gatherings with Friends and Family: Once a week    Attends Religious Services: More than 4 times per year    Active Member of [employer] or Organizations: Yes    Attends Banker Meetings: Never    Marital Status: Married  Catering manager Violence: Not At Risk (08/17/2023)   Humiliation, Afraid, Rape, and Kick questionnaire    Fear of Current or Ex-Partner: No     Emotionally Abused: No    Physically Abused: No    Sexually Abused: No   Family History:  Family History  Problem Relation Age of Onset   Hypothyroidism Daughter    Breast cancer Other    Cancer Mother    Other Father    Breast cancer Sister     Review of Systems: Constitutional: Doesn't report fevers, chills or abnormal weight loss Eyes: Doesn't report blurriness of vision Ears, nose, mouth, throat, and face: Doesn't report sore throat Respiratory: Doesn't report cough, dyspnea or wheezes Cardiovascular: Doesn't report palpitation, chest discomfort  Gastrointestinal:  Doesn't report nausea, constipation, diarrhea GU: Doesn't report incontinence Skin: Doesn't report skin rashes Neurological: Per HPI Musculoskeletal: Doesn't report joint pain Behavioral/Psych: Doesn't report anxiety  Physical Exam: Vitals:   08/17/23 1045  BP: 119/61  Pulse: (!) 101  Resp: 18  Temp: (!) 97.3 F (36.3 C)  SpO2: 90%   KPS: 70. General: Alert, cooperative, pleasant, in no acute distress Head: Normal EENT: No conjunctival injection or scleral icterus.  Lungs: Resp effort normal Cardiac: Regular rate Abdomen: Non-distended abdomen Skin: No rashes cyanosis or petechiae. Extremities: No clubbing or edema  Neurologic Exam: Mental Status: Awake, alert, attentive to examiner. Oriented to self and environment. Language is fluent with intact comprehension.  Cranial Nerves: Visual acuity is grossly normal. Visual fields are full. Extra-ocular movements intact. No ptosis. Face is symmetric Motor: Tone and bulk are normal. Power is 4+/5 in left arm and leg. Reflexes are symmetric, no pathologic reflexes present.  Sensory: Stocking changes Gait: Dystaxic, hemiparetic   Labs: I have reviewed the data as listed    Component Value Date/Time   NA 136 08/12/2023 1351   K 4.3 08/12/2023 1351   CL 100 08/12/2023 1351   CO2 30 08/12/2023 1351   GLUCOSE 134 (H) 08/12/2023 1351   BUN 31 (H)  08/12/2023 1351   CREATININE 0.74 08/12/2023 1351   CALCIUM  9.7 08/12/2023 1351   PROT 7.6 08/12/2023 1351   ALBUMIN 3.9 08/12/2023 1351   AST 16 08/12/2023 1351   ALT 17 08/12/2023 1351   ALKPHOS 73 08/12/2023 1351   BILITOT 0.4 08/12/2023 1351   GFRNONAA >60  08/12/2023 1351   GFRAA 122 09/08/2007 1631   Lab Results  Component Value Date   WBC 9.9 08/12/2023   NEUTROABS 7.8 (H) 08/12/2023   HGB 11.5 (L) 08/12/2023   HCT 36.6 (L) 08/12/2023   MCV 85.9 08/12/2023   PLT 296 08/12/2023    Imaging:  MR Brain W Wo Contrast Result Date: 08/17/2023 CLINICAL DATA:  Provided history: Malignant neoplasm of lower lobe of right lung. Non-small cell lung cancer, staging. New onset dizziness. Recent history of stroke. EXAM: MRI HEAD WITHOUT AND WITH CONTRAST TECHNIQUE: Multiplanar, multiecho pulse sequences of the brain and surrounding structures were obtained without and with intravenous contrast. CONTRAST:  7mL GADAVIST  GADOBUTROL  1 MMOL/ML IV SOLN COMPARISON:  Non-contrast head CT and CT angiogram head/neck 05/10/2023. Brain MRI 05/09/2023. FINDINGS: Brain: Generalized cerebral atrophy. Known small chronic cortical/subcortical infarct within the right occipital lobe (PCA vascular territory). Small amount of chronic hemosiderin deposition at this site. Additional known small chronic infarcts within the cortical/subcortical right frontal and right parietal lobes, as well as right basal ganglia. Some of these infarcts were better appreciated on the prior brain MRI of 05/10/2023 (acute at that time). Moderate multifocal T2 FLAIR hyperintense signal abnormality elsewhere within the cerebral white matter, nonspecific but compatible chronic small vessel ischemic disease. Mild chronic small vessel ischemic changes also present within the pons. Small chronic infarct again demonstrated within the left cerebellar hemisphere. 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 There is no acute infarct. No evidence of an intracranial mass. No extra-axial fluid collection. No midline shift. No pathologic intracranial enhancement identified. Vascular: Maintained flow voids within the proximal large arterial vessels. Skull and upper cervical spine: No focal worrisome marrow lesion. Mild C2-C3 grade 1 anterolisthesis. Sinuses/Orbits: No mass or acute finding within the imaged orbits. Prior bilateral ocular lens replacement. No significant paranasal sinus disease. IMPRESSION: 1.  No evidence of an acute intracranial abnormality. 2. No evidence of intracranial metastatic disease. 3. Parenchymal atrophy, chronic small vessel ischemic disease and known chronic infarcts, as described. 4. 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 Electronically Signed   By: Rockey Childs D.O.   On: 08/17/2023 08:37   IR IMAGING GUIDED PORT INSERTION Result Date: 08/16/2023 INDICATION: Port plmt RIGHT lung cancer. EXAM: IMPLANTED PORT A CATH PLACEMENT WITH ULTRASOUND AND FLUOROSCOPIC GUIDANCE MEDICATIONS: None ANESTHESIA/SEDATION: Moderate (conscious) sedation was employed during this procedure. A total of Versed  1 mg and Fentanyl  50 mcg was administered intravenously. Moderate Sedation Time: 15 minutes. The patient's level of consciousness and vital signs were monitored continuously by radiology nursing throughout the procedure under my direct supervision. FLUOROSCOPY: Radiation Exposure Index and estimated peak skin dose (PSD); Reference air kerma (RAK), 4.4 mGy. Kerma-area product (KAP), 148.6 uGy*m. COMPLICATIONS: None immediate. PROCEDURE: The procedure, risks, benefits, and alternatives were explained to the patient. Questions  regarding the procedure were encouraged and answered. The patient understands and consents to the procedure. The LEFT neck and chest were prepped with chlorhexidine  in a sterile fashion, and a sterile drape was applied covering the operative field. Maximum barrier sterile technique with sterile gowns and gloves were used for the procedure. A timeout was performed prior to the initiation of the procedure. Local anesthesia was provided with 1% lidocaine  with epinephrine . After creating a small venotomy incision, a micropuncture kit was  utilized to access the internal jugular vein under direct, real-time ultrasound guidance. Ultrasound image documentation was performed. The microwire was kinked to measure appropriate catheter length. A subcutaneous port pocket was then created along the upper chest wall utilizing a combination of sharp and blunt dissection. The pocket was irrigated with sterile saline. A single lumen power injectable port was chosen for placement. The 8 Fr catheter was tunneled from the port pocket site to the venotomy incision. The port was placed in the pocket. The external catheter was trimmed to appropriate length. At the venotomy, an 8 Fr peel-away sheath was placed over a guidewire under fluoroscopic guidance. The catheter was then placed through the sheath and the sheath was removed. Final catheter positioning was confirmed and documented with a fluoroscopic spot radiograph. The port was accessed with a Huber needle, aspirated and flushed with heparinized saline. The port pocket incision was closed with interrupted 3-0 Vicryl suture then Dermabond was applied, including at the venotomy incision. Dressings were placed. The patient tolerated the procedure well without immediate post procedural complication. IMPRESSION: Successful placement of a LEFT internal jugular approach power injectable Port-A-Cath. The tip of the catheter is positioned within the proximal RIGHT atrium. The catheter is ready  for immediate use. Thom Hall, MD [facility] Electronically Signed   By: Thom Hall M.D.   On: 08/16/2023 12:41   NM PET Image Restag (PS) Skull Base To Thigh Result Date: 08/11/2023 CLINICAL DATA:  Subsequent treatment strategy for non-small cell lung cancer. Treatment after initial staging interrupted by stroke. Restaging needed. EXAM: NUCLEAR MEDICINE PET SKULL BASE TO THIGH TECHNIQUE: 7.59 mCi F-18 FDG was injected intravenously. Full-ring PET imaging was performed from the skull base to thigh after the radiotracer. CT data was obtained and used for attenuation correction and anatomic localization. Fasting blood glucose: 103 mg/dl COMPARISON:  PET-CT 97/72/7974.  Chest CT 07/10/2023. FINDINGS: Mediastinal blood pool activity: SUV max 2.8 NECK: No hypermetabolic cervical lymph nodes are identified. No suspicious activity identified within the pharyngeal mucosal space. Incidental CT findings: Bilateral carotid atherosclerosis with a probable right carotid stent. CHEST: Large hypermetabolic right hilar mass is again noted, slightly enlarged from the previous PET-CT. This currently measures 8.4 x 6.2 cm on image 40/7 (previously 7.6 x 5.6 cm). There is fairly homogeneous hypermetabolic activity within this lesion with an SUV max of 22.3 (previously 26.2). As before, this mass involves all 3 lobes and partially encases the right upper, middle and lower lobe bronchi which remain patent. No peripheral hypermetabolic pulmonary activity or other suspicious pulmonary nodularity. No significant changes small subcarinal node measuring 7 mm short axis on image 74/4 with an SUV max of 5.2. No other separate hypermetabolic mediastinal lymph nodes. Incidental CT findings: Atherosclerosis of the aorta, great vessels and coronary arteries. Mild emphysema with scattered subpleural reticulation in both lungs and mild asymmetric right upper lobe scarring. No lobar  collapse. ABDOMEN/PELVIS: Similar small mildly hypermetabolic left adrenal nodule measuring 2.1 x 1.4 cm with an SUV max of 5.6 (previously 4.5). This nodule measures 27 HU, although is similar in size to prior CT's dating back to 04/27/2017, favoring an adenoma. No hypermetabolic activity within the right adrenal gland, liver, pancreas or spleen. There is no hypermetabolic nodal activity in the abdomen or pelvis. Incidental CT findings: Contrast material within the renal collecting systems and bladder from preceding brain MRI. Aortic and branch vessel atherosclerosis with a 4.0 x 4.7 cm infrarenal abdominal aortic aneurysm, unchanged from previous  PET-CT (remeasured). Treatment changes within the prostate gland. The urinary bladder is trabeculated. SKELETON: There is no hypermetabolic activity to suggest osseous metastatic disease. Incidental CT findings: Multilevel spondylosis. Grossly stable chronic compression deformities at L1 and L2, without hypermetabolic activity. IMPRESSION: 1. Little overall change compared with previous PET-CT of 04/22/2023. The large hypermetabolic right hilar mass has slightly enlarged in the interval with similar intense hypermetabolic activity, consistent with known primary bronchogenic carcinoma. 2. Stable small hypermetabolic subcarinal lymph node, suspicious for nodal metastasis. 3. Similar small mildly hypermetabolic left adrenal nodule, indeterminate although likely an adenoma based on stability from remote CT's. 4. No other evidence of distant metastatic disease. 5. Stable infrarenal abdominal aortic aneurysm measuring 4.7 cm. Recommend CTA or MRA, as appropriate, in 12 months and referral to vascular specialist. Reference: Journal of Vascular Surgery 67.1 (2018): 2-77. J Am Coll Radiol 2013;10:789-794. 6. Aortic Atherosclerosis (ICD10-I70.0) and Emphysema (ICD10-J43.9). Electronically Signed   By: Elsie Perone M.D.   On: 08/11/2023 18:18   DG Shoulder Right Result Date:  07/29/2023 CLINICAL DATA:  Right shoulder pain for several months. EXAM: RIGHT SHOULDER - 2+ VIEW COMPARISON:  None Available. FINDINGS: There is no evidence of fracture or dislocation. Minimal degenerative changes seen involving the right glenohumeral joint. Soft tissues are unremarkable. IMPRESSION: Minimal osteoarthritis of right glenohumeral joint. No acute abnormality seen. Electronically Signed   By: Lynwood Landy Raddle M.D.   On: 07/29/2023 14:50   DG C-Arm 1-60 Min-No Report Result Date: 07/26/2023 Fluoroscopy was utilized by the requesting physician.  No radiographic interpretation.    [facility] Clinician Interpretation: I have personally reviewed the radiological images as listed.  My interpretation, in the context of the patient's clinical presentation, is stable disease   Assessment/Plan Cerebrovascular accident (CVA) due to stenosis of right carotid artery (HCC)  Dawn A. Zaragoza is clinically stable today, following stroke and right carotid stent placement in March.  He continues to work with rehab.  Motor function is also partly limited by longstanding neuropathy from prior B12 deficiency.    MRI brain demonstrates stable findings, no further infarcts.  He continues on ASA 81mg  daily, Plavix  75mg  daily, Ezetimibe  for secondary prevention.  He is following with vascular surgery for periodic carotid ultrasounds.  Dizziness is likely positional, orthostatic secondary to carotid stenosis and neuropathy.    Neuropathy symptoms are static over many years.  We spent twenty additional minutes teaching regarding the natural history, biology, and historical experience in the treatment of neurologic complications of cancer.   We appreciate the opportunity to participate in the care of Yvonne A. Sturtevant.  He may follow up as needed for new or progressive neurologic symptoms.  All questions were answered. The patient knows to call the clinic with any problems, questions or concerns. No barriers  to learning were detected.  The total time spent in the encounter was 40 minutes and more than 50% was on counseling and review of test results   Arthea MARLA Manns, MD Medical Director of Neuro-Oncology [facility] 08/17/23 2:16 PM

## 2023-08-18 ENCOUNTER — Telehealth: Payer: Self-pay | Admitting: Sports Medicine

## 2023-08-18 ENCOUNTER — Other Ambulatory Visit: Payer: Self-pay

## 2023-08-18 NOTE — Telephone Encounter (Signed)
 Patients wife [place] called to cancel appointment because patient has chemo and radiation going on that day and during that time and they are going to wait to reschedule appointment for now. Wife states that patients shoulder is feeling better. She asked that I send a message over so Dr. Leonce knows why they cancelled appointment.  FYI

## 2023-08-19 DIAGNOSIS — I10 Essential (primary) hypertension: Secondary | ICD-10-CM | POA: Diagnosis not present

## 2023-08-19 DIAGNOSIS — M109 Gout, unspecified: Secondary | ICD-10-CM | POA: Diagnosis not present

## 2023-08-19 DIAGNOSIS — G629 Polyneuropathy, unspecified: Secondary | ICD-10-CM | POA: Diagnosis not present

## 2023-08-19 DIAGNOSIS — C3411 Malignant neoplasm of upper lobe, right bronchus or lung: Secondary | ICD-10-CM | POA: Diagnosis not present

## 2023-08-19 DIAGNOSIS — Z510 Encounter for antineoplastic radiation therapy: Secondary | ICD-10-CM | POA: Diagnosis not present

## 2023-08-19 DIAGNOSIS — Z8551 Personal history of malignant neoplasm of bladder: Secondary | ICD-10-CM | POA: Diagnosis not present

## 2023-08-19 DIAGNOSIS — K222 Esophageal obstruction: Secondary | ICD-10-CM | POA: Diagnosis not present

## 2023-08-19 DIAGNOSIS — K2100 Gastro-esophageal reflux disease with esophagitis, without bleeding: Secondary | ICD-10-CM | POA: Diagnosis not present

## 2023-08-19 DIAGNOSIS — C3431 Malignant neoplasm of lower lobe, right bronchus or lung: Secondary | ICD-10-CM | POA: Diagnosis not present

## 2023-08-19 DIAGNOSIS — Z87891 Personal history of nicotine dependence: Secondary | ICD-10-CM | POA: Diagnosis not present

## 2023-08-19 DIAGNOSIS — Z5111 Encounter for antineoplastic chemotherapy: Secondary | ICD-10-CM | POA: Diagnosis not present

## 2023-08-20 ENCOUNTER — Encounter (HOSPITAL_COMMUNITY): Payer: Self-pay | Admitting: Internal Medicine

## 2023-08-21 ENCOUNTER — Other Ambulatory Visit: Payer: Self-pay | Admitting: Family Medicine

## 2023-08-21 DIAGNOSIS — R051 Acute cough: Secondary | ICD-10-CM

## 2023-08-21 DIAGNOSIS — J069 Acute upper respiratory infection, unspecified: Secondary | ICD-10-CM

## 2023-08-23 ENCOUNTER — Ambulatory Visit
Admission: RE | Admit: 2023-08-23 | Discharge: 2023-08-23 | Disposition: A | Discharge status: Home / Self Care | Source: Ambulatory Visit | Attending: Radiation Oncology | Admitting: Radiation Oncology

## 2023-08-23 ENCOUNTER — Other Ambulatory Visit: Payer: Self-pay

## 2023-08-23 ENCOUNTER — Inpatient Hospital Stay

## 2023-08-23 VITALS — BP 126/76 | HR 78 | Temp 98.1°F | Resp 20 | Ht 72.0 in | Wt 153.2 lb

## 2023-08-23 DIAGNOSIS — Z5111 Encounter for antineoplastic chemotherapy: Secondary | ICD-10-CM | POA: Diagnosis not present

## 2023-08-23 DIAGNOSIS — C3431 Malignant neoplasm of lower lobe, right bronchus or lung: Secondary | ICD-10-CM

## 2023-08-23 DIAGNOSIS — Z510 Encounter for antineoplastic radiation therapy: Secondary | ICD-10-CM | POA: Diagnosis not present

## 2023-08-23 DIAGNOSIS — I10 Essential (primary) hypertension: Secondary | ICD-10-CM | POA: Diagnosis not present

## 2023-08-23 DIAGNOSIS — C3411 Malignant neoplasm of upper lobe, right bronchus or lung: Secondary | ICD-10-CM | POA: Diagnosis not present

## 2023-08-23 DIAGNOSIS — Z87891 Personal history of nicotine dependence: Secondary | ICD-10-CM | POA: Diagnosis not present

## 2023-08-23 DIAGNOSIS — M109 Gout, unspecified: Secondary | ICD-10-CM | POA: Diagnosis not present

## 2023-08-23 DIAGNOSIS — K2100 Gastro-esophageal reflux disease with esophagitis, without bleeding: Secondary | ICD-10-CM | POA: Diagnosis not present

## 2023-08-23 DIAGNOSIS — G629 Polyneuropathy, unspecified: Secondary | ICD-10-CM | POA: Diagnosis not present

## 2023-08-23 DIAGNOSIS — K222 Esophageal obstruction: Secondary | ICD-10-CM | POA: Diagnosis not present

## 2023-08-23 DIAGNOSIS — Z8551 Personal history of malignant neoplasm of bladder: Secondary | ICD-10-CM | POA: Diagnosis not present

## 2023-08-23 LAB — CBC WITH DIFFERENTIAL (CANCER CENTER ONLY)
Abs Immature Granulocytes: 0.02 10*3/uL (ref 0.00–0.07)
Basophils Absolute: 0.1 10*3/uL (ref 0.0–0.1)
Basophils Relative: 1 %
Eosinophils Absolute: 0.2 10*3/uL (ref 0.0–0.5)
Eosinophils Relative: 2 %
HCT: 34.5 % — ABNORMAL LOW (ref 39.0–52.0)
Hemoglobin: 11 g/dL — ABNORMAL LOW (ref 13.0–17.0)
Immature Granulocytes: 0 %
Lymphocytes Relative: 11 %
Lymphs Abs: 0.9 10*3/uL (ref 0.7–4.0)
MCH: 27.4 pg (ref 26.0–34.0)
MCHC: 31.9 g/dL (ref 30.0–36.0)
MCV: 85.8 fL (ref 80.0–100.0)
Monocytes Absolute: 0.8 10*3/uL (ref 0.1–1.0)
Monocytes Relative: 9 %
Neutro Abs: 6.6 10*3/uL (ref 1.7–7.7)
Neutrophils Relative %: 77 %
Platelet Count: 229 10*3/uL (ref 150–400)
RBC: 4.02 MIL/uL — ABNORMAL LOW (ref 4.22–5.81)
RDW: 16.2 % — ABNORMAL HIGH (ref 11.5–15.5)
WBC Count: 8.5 10*3/uL (ref 4.0–10.5)
nRBC: 0 % (ref 0.0–0.2)

## 2023-08-23 LAB — CMP (CANCER CENTER ONLY)
ALT: 12 U/L (ref 0–44)
AST: 15 U/L (ref 15–41)
Albumin: 3.7 g/dL (ref 3.5–5.0)
Alkaline Phosphatase: 74 U/L (ref 38–126)
Anion gap: 6 (ref 5–15)
BUN: 26 mg/dL — ABNORMAL HIGH (ref 8–23)
CO2: 28 mmol/L (ref 22–32)
Calcium: 9.6 mg/dL (ref 8.9–10.3)
Chloride: 101 mmol/L (ref 98–111)
Creatinine: 0.68 mg/dL (ref 0.61–1.24)
GFR, Estimated: >60 mL/min (ref >60)
Glucose, Bld: 122 mg/dL — ABNORMAL HIGH (ref 70–99)
Potassium: 4.3 mmol/L (ref 3.5–5.1)
Sodium: 135 mmol/L (ref 135–145)
Total Bilirubin: 0.4 mg/dL (ref 0.0–1.2)
Total Protein: 7.5 g/dL (ref 6.5–8.1)

## 2023-08-23 LAB — RAD ONC ARIA SESSION SUMMARY
Course Elapsed Days: 0
Plan Fractions Treated to Date: 1
Plan Prescribed Dose Per Fraction: 2 Gy
Plan Total Fractions Prescribed: 30
Plan Total Prescribed Dose: 60 Gy
Reference Point Dosage Given to Date: 2 Gy
Reference Point Session Dosage Given: 2 Gy
Session Number: 1

## 2023-08-23 MED ORDER — DEXAMETHASONE SODIUM PHOSPHATE 10 MG/ML IJ SOLN
10.0000 mg | Freq: Once | INTRAMUSCULAR | Status: AC
  Administered 2023-08-23: 10 mg via INTRAVENOUS
  Filled 2023-08-23: qty 1

## 2023-08-23 MED ORDER — SODIUM CHLORIDE 0.9 % IV SOLN: INTRAVENOUS | Status: DC

## 2023-08-23 MED ORDER — SODIUM CHLORIDE 0.9 % IV SOLN
45.0000 mg/m2 | Freq: Once | INTRAVENOUS | Status: AC
  Administered 2023-08-23: 84 mg via INTRAVENOUS
  Filled 2023-08-23: qty 14

## 2023-08-23 MED ORDER — DIPHENHYDRAMINE HCL 50 MG/ML IJ SOLN
50.0000 mg | Freq: Once | INTRAMUSCULAR | Status: AC
  Administered 2023-08-23: 50 mg via INTRAVENOUS
  Filled 2023-08-23: qty 1

## 2023-08-23 MED ORDER — FAMOTIDINE IN NACL 20-0.9 MG/50ML-% IV SOLN
20.0000 mg | Freq: Once | INTRAVENOUS | Status: AC
  Administered 2023-08-23: 20 mg via INTRAVENOUS
  Filled 2023-08-23: qty 50

## 2023-08-23 MED ORDER — SODIUM CHLORIDE 0.9 % IV SOLN
151.2000 mg | Freq: Once | INTRAVENOUS | Status: AC
  Administered 2023-08-23: 150 mg via INTRAVENOUS
  Filled 2023-08-23: qty 15

## 2023-08-23 MED ORDER — PALONOSETRON HCL INJECTION 0.25 MG/5ML
0.2500 mg | Freq: Once | INTRAVENOUS | Status: AC
  Administered 2023-08-23: 0.25 mg via INTRAVENOUS
  Filled 2023-08-23: qty 5

## 2023-08-23 NOTE — Patient Instructions (Addendum)
 [facility] - A DEPT OF [facility]  Discharge Instructions: Thank you for choosing [facility] to provide your oncology and hematology care.   If you have a lab appointment with the Cancer Center, please go directly to the Cancer Center and check in at the registration area.   Wear comfortable clothing and clothing appropriate for easy access to any Portacath or PICC line.   We strive to give you quality time with your provider. You may need to reschedule your appointment if you arrive late (15 or more minutes).  Arriving late affects you and other patients whose appointments are after yours.  Also, if you miss three or more appointments without notifying the office, you may be dismissed from the clinic at the provider's discretion.      For prescription refill requests, have your pharmacy contact our office and allow 72 hours for refills to be completed.    Today you received the following chemotherapy and/or immunotherapy agents : Paclitaxel (Taxol) and Carboplatin    To help prevent nausea and vomiting after your treatment, we encourage you to take your nausea medication as directed.  BELOW ARE SYMPTOMS THAT SHOULD BE REPORTED IMMEDIATELY: *FEVER GREATER THAN 100.4 F (38 C) OR HIGHER *CHILLS OR SWEATING *NAUSEA AND VOMITING THAT IS NOT CONTROLLED WITH YOUR NAUSEA MEDICATION *UNUSUAL SHORTNESS OF BREATH *UNUSUAL BRUISING OR BLEEDING *URINARY PROBLEMS (pain or burning when urinating, or frequent urination) *BOWEL PROBLEMS (unusual diarrhea, constipation, pain near the anus) TENDERNESS IN MOUTH AND THROAT WITH OR WITHOUT PRESENCE OF ULCERS (sore throat, sores in mouth, or a toothache) UNUSUAL RASH, SWELLING OR PAIN  UNUSUAL VAGINAL DISCHARGE OR ITCHING   Items with * indicate a potential emergency and should be followed up as soon as possible or go to the Emergency Department if any problems should occur.  Please show the CHEMOTHERAPY  ALERT CARD or IMMUNOTHERAPY ALERT CARD at check-in to the Emergency Department and triage nurse.  Should you have questions after your visit or need to cancel or reschedule your appointment, please contact [facility] - A DEPT OF [facility]  Dept: [phone]  and follow the prompts.  Office hours are 8:00 a.m. to 4:30 p.m. Monday - Friday. Please note that voicemails left after 4:00 p.m. may not be returned until the following business day.  We are closed weekends and major holidays. You have access to a nurse at all times for urgent questions. Please call the main number to the clinic Dept: [phone] and follow the prompts.   For any non-urgent questions, you may also contact your provider using MyChart. We now offer e-Visits for anyone 55 and older to request care online for non-urgent symptoms. For details visit [email or web address].   Also download the MyChart app! Go to the app store, search "MyChart", open the app, select [place], and log in with your MyChart username and password.  Paclitaxel Injection What is this medication? PACLITAXEL (PAK li TAX el) treats some types of cancer. It works by slowing down the growth of cancer cells. This medicine may be used for other purposes; ask your health care provider or pharmacist if you have questions. COMMON BRAND NAME(S): Onxol, Taxol What should I tell my care team before I take this medication? They need to know if you have any of these conditions: Heart disease Liver disease Low white blood cell levels An unusual or allergic reaction to paclitaxel, other  medications, foods, dyes, or preservatives If you or your partner are pregnant or trying to get pregnant Breast-feeding How should I use this medication? This medication is injected into a vein. It is given by your care team in a hospital or clinic setting. Talk to your care team about the use of this medication in children. While it may be  given to children for selected conditions, precautions do apply. Overdosage: If you think you have taken too much of this medicine contact a poison control center or emergency room at once. NOTE: This medicine is only for you. Do not share this medicine with others. What if I miss a dose? Keep appointments for follow-up doses. It is important not to miss your dose. Call your care team if you are unable to keep an appointment. What may interact with this medication? Do not take this medication with any of the following: Live virus vaccines Other medications may affect the way this medication works. Talk with your care team about all of the medications you take. They may suggest changes to your treatment plan to lower the risk of side effects and to make sure your medications work as intended. This list may not describe all possible interactions. Give your health care provider a list of all the medicines, herbs, non-prescription drugs, or dietary supplements you use. Also tell them if you smoke, drink alcohol, or use illegal drugs. Some items may interact with your medicine. What should I watch for while using this medication? Your condition will be monitored carefully while you are receiving this medication. You may need blood work while taking this medication. This medication may make you feel generally unwell. This is not uncommon as chemotherapy can affect healthy cells as well as cancer cells. Report any side effects. Continue your course of treatment even though you feel ill unless your care team tells you to stop. This medication can cause serious allergic reactions. To reduce the risk, your care team may give you other medications to take before receiving this one. Be sure to follow the directions from your care team. This medication may increase your risk of getting an infection. Call your care team for advice if you get a fever, chills, sore throat, or other symptoms of a cold or flu. Do not  treat yourself. Try to avoid being around people who are sick. This medication may increase your risk to bruise or bleed. Call your care team if you notice any unusual bleeding. Be careful brushing or flossing your teeth or using a toothpick because you may get an infection or bleed more easily. If you have any dental work done, tell your dentist you are receiving this medication. Talk to your care team if you may be pregnant. Serious birth defects can occur if you take this medication during pregnancy. Talk to your care team before breastfeeding. Changes to your treatment plan may be needed. What side effects may I notice from receiving this medication? Side effects that you should report to your care team as soon as possible: Allergic reactions--skin rash, itching, hives, swelling of the face, lips, tongue, or throat Heart rhythm changes--fast or irregular heartbeat, dizziness, feeling faint or lightheaded, chest pain, trouble breathing Increase in blood pressure Infection--fever, chills, cough, sore throat, wounds that don't heal, pain or trouble when passing urine, general feeling of discomfort or being unwell Low blood pressure--dizziness, feeling faint or lightheaded, blurry vision Low red blood cell level--unusual weakness or fatigue, dizziness, headache, trouble breathing Painful swelling, warmth, or  redness of the skin, blisters or sores at the infusion site Pain, tingling, or numbness in the hands or feet Slow heartbeat--dizziness, feeling faint or lightheaded, confusion, trouble breathing, unusual weakness or fatigue Unusual bruising or bleeding Side effects that usually do not require medical attention (report to your care team if they continue or are bothersome): Diarrhea Hair loss Joint pain Loss of appetite Muscle pain Nausea Vomiting This list may not describe all possible side effects. Call your doctor for medical advice about side effects. You may report side effects to FDA  at 1-800-FDA-1088. Where should I keep my medication? This medication is given in a hospital or clinic. It will not be stored at home. NOTE: This sheet is a summary. It may not cover all possible information. If you have questions about this medicine, talk to your doctor, pharmacist, or health care provider.  2024 Elsevier/Gold Standard (2021-07-01 00:00:00)  Carboplatin Injection What is this medication? CARBOPLATIN (KAR boe pla tin) treats some types of cancer. It works by slowing down the growth of cancer cells. This medicine may be used for other purposes; ask your health care provider or pharmacist if you have questions. COMMON BRAND NAME(S): Paraplatin What should I tell my care team before I take this medication? They need to know if you have any of these conditions: Blood disorders Hearing problems Kidney disease Recent or ongoing radiation therapy An unusual or allergic reaction to carboplatin, cisplatin, other medications, foods, dyes, or preservatives Pregnant or trying to get pregnant Breast-feeding How should I use this medication? This medication is injected into a vein. It is given by your care team in a hospital or clinic setting. Talk to your care team about the use of this medication in children. Special care may be needed. Overdosage: If you think you have taken too much of this medicine contact a poison control center or emergency room at once. NOTE: This medicine is only for you. Do not share this medicine with others. What if I miss a dose? Keep appointments for follow-up doses. It is important not to miss your dose. Call your care team if you are unable to keep an appointment. What may interact with this medication? Medications for seizures Some antibiotics, such as amikacin, gentamicin, neomycin, streptomycin, tobramycin Vaccines This list may not describe all possible interactions. Give your health care provider a list of all the medicines, herbs,  non-prescription drugs, or dietary supplements you use. Also tell them if you smoke, drink alcohol, or use illegal drugs. Some items may interact with your medicine. What should I watch for while using this medication? Your condition will be monitored carefully while you are receiving this medication. You may need blood work while taking this medication. This medication may make you feel generally unwell. This is not uncommon, as chemotherapy can affect healthy cells as well as cancer cells. Report any side effects. Continue your course of treatment even though you feel ill unless your care team tells you to stop. In some cases, you may be given additional medications to help with side effects. Follow all directions for their use. This medication may increase your risk of getting an infection. Call your care team for advice if you get a fever, chills, sore throat, or other symptoms of a cold or flu. Do not treat yourself. Try to avoid being around people who are sick. Avoid taking medications that contain aspirin, acetaminophen, ibuprofen, naproxen, or ketoprofen unless instructed by your care team. These medications may hide a fever. Be  careful brushing or flossing your teeth or using a toothpick because you may get an infection or bleed more easily. If you have any dental work done, tell your dentist you are receiving this medication. Talk to your care team if you wish to become pregnant or think you might be pregnant. This medication can cause serious birth defects. Talk to your care team about effective forms of contraception. Do not breast-feed while taking this medication. What side effects may I notice from receiving this medication? Side effects that you should report to your care team as soon as possible: Allergic reactions--skin rash, itching, hives, swelling of the face, lips, tongue, or throat Infection--fever, chills, cough, sore throat, wounds that don't heal, pain or trouble when passing  urine, general feeling of discomfort or being unwell Low red blood cell level--unusual weakness or fatigue, dizziness, headache, trouble breathing Pain, tingling, or numbness in the hands or feet, muscle weakness, change in vision, confusion or trouble speaking, loss of balance or coordination, trouble walking, seizures Unusual bruising or bleeding Side effects that usually do not require medical attention (report to your care team if they continue or are bothersome): Hair loss Nausea Unusual weakness or fatigue Vomiting This list may not describe all possible side effects. Call your doctor for medical advice about side effects. You may report side effects to FDA at 1-800-FDA-1088. Where should I keep my medication? This medication is given in a hospital or clinic. It will not be stored at home. NOTE: This sheet is a summary. It may not cover all possible information. If you have questions about this medicine, talk to your doctor, pharmacist, or health care provider.  2024 Elsevier/Gold Standard (2021-06-03 00:00:00)

## 2023-08-24 ENCOUNTER — Other Ambulatory Visit: Payer: Self-pay

## 2023-08-24 ENCOUNTER — Other Ambulatory Visit: Payer: Self-pay | Admitting: Acute Care

## 2023-08-24 ENCOUNTER — Ambulatory Visit
Admission: RE | Admit: 2023-08-24 | Discharge: 2023-08-24 | Disposition: A | Discharge status: Home / Self Care | Source: Ambulatory Visit | Attending: Radiation Oncology | Admitting: Radiation Oncology

## 2023-08-24 ENCOUNTER — Encounter (HOSPITAL_COMMUNITY): Payer: Self-pay | Admitting: Internal Medicine

## 2023-08-24 ENCOUNTER — Encounter: Payer: Self-pay | Admitting: Internal Medicine

## 2023-08-24 DIAGNOSIS — K222 Esophageal obstruction: Secondary | ICD-10-CM | POA: Diagnosis not present

## 2023-08-24 DIAGNOSIS — Z510 Encounter for antineoplastic radiation therapy: Secondary | ICD-10-CM | POA: Insufficient documentation

## 2023-08-24 DIAGNOSIS — Z87891 Personal history of nicotine dependence: Secondary | ICD-10-CM | POA: Diagnosis not present

## 2023-08-24 DIAGNOSIS — Z8551 Personal history of malignant neoplasm of bladder: Secondary | ICD-10-CM | POA: Diagnosis not present

## 2023-08-24 DIAGNOSIS — G629 Polyneuropathy, unspecified: Secondary | ICD-10-CM | POA: Diagnosis not present

## 2023-08-24 DIAGNOSIS — K2100 Gastro-esophageal reflux disease with esophagitis, without bleeding: Secondary | ICD-10-CM | POA: Diagnosis not present

## 2023-08-24 DIAGNOSIS — J069 Acute upper respiratory infection, unspecified: Secondary | ICD-10-CM

## 2023-08-24 DIAGNOSIS — C3431 Malignant neoplasm of lower lobe, right bronchus or lung: Secondary | ICD-10-CM | POA: Insufficient documentation

## 2023-08-24 DIAGNOSIS — Z5111 Encounter for antineoplastic chemotherapy: Secondary | ICD-10-CM | POA: Diagnosis not present

## 2023-08-24 DIAGNOSIS — I10 Essential (primary) hypertension: Secondary | ICD-10-CM | POA: Insufficient documentation

## 2023-08-24 DIAGNOSIS — C3411 Malignant neoplasm of upper lobe, right bronchus or lung: Secondary | ICD-10-CM | POA: Insufficient documentation

## 2023-08-24 DIAGNOSIS — R051 Acute cough: Secondary | ICD-10-CM

## 2023-08-24 DIAGNOSIS — M109 Gout, unspecified: Secondary | ICD-10-CM | POA: Diagnosis not present

## 2023-08-24 DIAGNOSIS — C61 Malignant neoplasm of prostate: Secondary | ICD-10-CM | POA: Diagnosis not present

## 2023-08-24 LAB — RAD ONC ARIA SESSION SUMMARY
Course Elapsed Days: 1
Plan Fractions Treated to Date: 2
Plan Prescribed Dose Per Fraction: 2 Gy
Plan Total Fractions Prescribed: 30
Plan Total Prescribed Dose: 60 Gy
Reference Point Dosage Given to Date: 4 Gy
Reference Point Session Dosage Given: 2 Gy
Session Number: 2

## 2023-08-24 MED ORDER — BENZONATATE 100 MG PO CAPS: 100.0000 mg | ORAL_CAPSULE | Freq: Four times a day (QID) | ORAL | 0 refills | Status: DC | PRN

## 2023-08-24 NOTE — Telephone Encounter (Signed)
 Copied from CRM [id number]. Topic: Clinical - Medication Refill >> Aug 24, 2023 10:01 AM Russell PARAS wrote: Medication: benzonatate  (TESSALON ) 100 MG capsule   Has the patient contacted their pharmacy? Yes (Agent: If no, request that the patient contact the pharmacy for the refill. If patient does not wish to contact the pharmacy document the reason why and proceed with request.) (Agent: If yes, when and what did the pharmacy advise?)  This is the patient's preferred pharmacy:  [id number] - [place], [place] - [address] [facility] [place] [address] Phone: [phone] Fax: [phone]  Is this the correct pharmacy for this prescription? Yes If no, delete pharmacy and type the correct one.   Has the prescription been filled recently? Yes  Is the patient out of the medication? Yes  Has the patient been seen for an appointment in the last year OR does the patient have an upcoming appointment? Yes  Can we respond through MyChart? Yes  Agent: Please be advised that Rx refills may take up to 3 business days. We ask that you follow-up with your pharmacy.

## 2023-08-24 NOTE — Telephone Encounter (Signed)
-----   Message from Nurse Ileana HERO sent at 08/23/2023  4:15 PM EDT ----- Regarding: First time Paclitaxel Carboplatin. Pt of Mohamed. First time Paclitaxel, Carboplatin. Pt of Mohamed. Tolerated infusion well. No adverse s/s. Nutrition consult placed for poor PO intake and weight loss. Please call to check in with patient/family when possible, thanks!

## 2023-08-24 NOTE — Telephone Encounter (Signed)
 Called & left message at home # to return call tomorrow to let us  know how he is doing since treatment.

## 2023-08-25 ENCOUNTER — Other Ambulatory Visit: Payer: Self-pay

## 2023-08-25 ENCOUNTER — Ambulatory Visit
Admission: RE | Admit: 2023-08-25 | Discharge: 2023-08-25 | Discharge status: Home / Self Care | Source: Ambulatory Visit | Attending: Radiation Oncology

## 2023-08-25 DIAGNOSIS — M109 Gout, unspecified: Secondary | ICD-10-CM | POA: Diagnosis not present

## 2023-08-25 DIAGNOSIS — G629 Polyneuropathy, unspecified: Secondary | ICD-10-CM | POA: Diagnosis not present

## 2023-08-25 DIAGNOSIS — C3411 Malignant neoplasm of upper lobe, right bronchus or lung: Secondary | ICD-10-CM | POA: Diagnosis not present

## 2023-08-25 DIAGNOSIS — Z5111 Encounter for antineoplastic chemotherapy: Secondary | ICD-10-CM | POA: Diagnosis not present

## 2023-08-25 DIAGNOSIS — Z8551 Personal history of malignant neoplasm of bladder: Secondary | ICD-10-CM | POA: Diagnosis not present

## 2023-08-25 DIAGNOSIS — K2100 Gastro-esophageal reflux disease with esophagitis, without bleeding: Secondary | ICD-10-CM | POA: Diagnosis not present

## 2023-08-25 DIAGNOSIS — I10 Essential (primary) hypertension: Secondary | ICD-10-CM | POA: Diagnosis not present

## 2023-08-25 DIAGNOSIS — Z87891 Personal history of nicotine dependence: Secondary | ICD-10-CM | POA: Diagnosis not present

## 2023-08-25 DIAGNOSIS — C3431 Malignant neoplasm of lower lobe, right bronchus or lung: Secondary | ICD-10-CM | POA: Diagnosis not present

## 2023-08-25 DIAGNOSIS — K222 Esophageal obstruction: Secondary | ICD-10-CM | POA: Diagnosis not present

## 2023-08-25 DIAGNOSIS — Z510 Encounter for antineoplastic radiation therapy: Secondary | ICD-10-CM | POA: Diagnosis not present

## 2023-08-25 LAB — RAD ONC ARIA SESSION SUMMARY
Course Elapsed Days: 2
Plan Fractions Treated to Date: 3
Plan Prescribed Dose Per Fraction: 2 Gy
Plan Total Fractions Prescribed: 30
Plan Total Prescribed Dose: 60 Gy
Reference Point Dosage Given to Date: 6 Gy
Reference Point Session Dosage Given: 2 Gy
Session Number: 3

## 2023-08-26 ENCOUNTER — Ambulatory Visit
Admission: RE | Admit: 2023-08-26 | Discharge: 2023-08-26 | Disposition: A | Discharge status: Home / Self Care | Source: Ambulatory Visit | Attending: Radiation Oncology | Admitting: Radiation Oncology

## 2023-08-26 ENCOUNTER — Other Ambulatory Visit: Payer: Self-pay

## 2023-08-26 DIAGNOSIS — K2100 Gastro-esophageal reflux disease with esophagitis, without bleeding: Secondary | ICD-10-CM | POA: Diagnosis not present

## 2023-08-26 DIAGNOSIS — C3431 Malignant neoplasm of lower lobe, right bronchus or lung: Secondary | ICD-10-CM | POA: Diagnosis not present

## 2023-08-26 DIAGNOSIS — K222 Esophageal obstruction: Secondary | ICD-10-CM | POA: Diagnosis not present

## 2023-08-26 DIAGNOSIS — M109 Gout, unspecified: Secondary | ICD-10-CM | POA: Diagnosis not present

## 2023-08-26 DIAGNOSIS — Z87891 Personal history of nicotine dependence: Secondary | ICD-10-CM | POA: Diagnosis not present

## 2023-08-26 DIAGNOSIS — Z8551 Personal history of malignant neoplasm of bladder: Secondary | ICD-10-CM | POA: Diagnosis not present

## 2023-08-26 DIAGNOSIS — C3411 Malignant neoplasm of upper lobe, right bronchus or lung: Secondary | ICD-10-CM | POA: Diagnosis not present

## 2023-08-26 DIAGNOSIS — I10 Essential (primary) hypertension: Secondary | ICD-10-CM | POA: Diagnosis not present

## 2023-08-26 DIAGNOSIS — Z5111 Encounter for antineoplastic chemotherapy: Secondary | ICD-10-CM | POA: Diagnosis not present

## 2023-08-26 DIAGNOSIS — G629 Polyneuropathy, unspecified: Secondary | ICD-10-CM | POA: Diagnosis not present

## 2023-08-26 DIAGNOSIS — Z510 Encounter for antineoplastic radiation therapy: Secondary | ICD-10-CM | POA: Diagnosis not present

## 2023-08-26 LAB — RAD ONC ARIA SESSION SUMMARY
Course Elapsed Days: 3
Plan Fractions Treated to Date: 4
Plan Prescribed Dose Per Fraction: 2 Gy
Plan Total Fractions Prescribed: 30
Plan Total Prescribed Dose: 60 Gy
Reference Point Dosage Given to Date: 8 Gy
Reference Point Session Dosage Given: 2 Gy
Session Number: 4

## 2023-08-28 NOTE — Progress Notes (Unsigned)
 [facility] OFFICE PROGRESS NOTE  [name], FNP 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 Stage 3B due to tumor size and lymph node involvement. Surgery is not feasible due to anatomical involvement, age, and COPD. Diagnosed in June 2025  PRIOR THERAPY: None  CURRENT THERAPY: Concurrent chemoradiation with weekly Carboplatin  for AUC 2 and Paclitaxel  45 mg/m2.  First dose on 08/23/2023  INTERVAL HISTORY: Stephen Meza 89 y.o. male returns to clinic today for follow-up visit.  The patient establish care in the clinic on 08/12/2023.  The patient was recently diagnosed with stage III non-small cell lung cancer.  He is status post his first week of chemotherapy and he tolerated it ***overall well except for **.  Today he denies any fever or chills.  He has been having some ongoing generalized weakness.  Weight loss?  He does have some restricted breathing at baseline and wheezing.  His breathing is ***.  Cough?  Shortness of breath?  He denies any chest pain or hemoptysis.  He denies any nausea, vomiting, diarrhea, or constipation.  He denies any headache or visual changes.  He is here today for evaluation repeat blood work before undergoing cycle #2 MEDICAL HISTORY: Past Medical History:  Diagnosis Date   Bladder cancer (HCC) 03/19/2016   DUODENITIS, WITHOUT HEMORRHAGE 07/14/2007   ERECTILE DYSFUNCTION 07/14/2007   Esophageal stricture 03/19/2016   ESOPHAGITIS 07/14/2007   EXTERNAL HEMORRHOIDS 07/14/2007   GERD 07/14/2007   Gout    HYPERTENSION 07/14/2007   PERIPHERAL NEUROPATHY 07/14/2007   Peripheral neuropathy 04/06/2019   PERIPHERAL VASCULAR DISEASE 07/14/2007   PODAGRA 07/14/2007   Prostate cancer (HCC) 02/2012   pt had screening and then biopsy   Prostate cancer (HCC) 04/13/2012    Mild rise in PSA lead to prostate biopsy - 1:11 positive for adenocarcinoma. CT abd/pelvis was negative except for enlarged prostate.     Treatment - hormonal     Retinal tear of right eye 03/19/2016   Stroke (HCC) 05/09/2023   TOBACCO ABUSE, HX OF 02/12/2010   TONSILLECTOMY, HX OF 07/14/2007   TREMOR, ESSENTIAL 07/14/2007   VITAMIN B12 DEFICIENCY 07/14/2007    ALLERGIES:  is allergic to ciprofloxacin hcl, statins, and nitrofuran derivatives.  MEDICATIONS:  Current Outpatient Medications  Medication Sig Dispense Refill   albuterol  (VENTOLIN  HFA) 108 (90 Base) MCG/ACT inhaler Take 1 puff as needed for shortness of breath or wheezing, 8.5 g 2   allopurinol  (ZYLOPRIM ) 100 MG tablet TAKE 1 TABLET BY MOUTH EVERY DAY 90 tablet 4   aspirin  EC 81 MG tablet Take 1 tablet (81 mg total) by mouth daily. Okay to restart on 07/27/2023.  Swallow whole.     benzonatate  (TESSALON ) 100 MG capsule Take 1 capsule (100 mg total) by mouth every 6 (six) hours as needed for cough. 30 capsule 0   carvedilol  (COREG ) 3.125 MG tablet TAKE 1 TABLET BY MOUTH TWICE A DAY WITH A MEAL 180 tablet 1   clopidogrel  (PLAVIX ) 75 MG tablet TAKE 1 TABLET BY MOUTH EVERY DAY 30 tablet 2   Cyanocobalamin  (B-12) 3000 MCG CAPS Take 3,000 mcg by mouth daily.     diclofenac  Sodium (VOLTAREN ) 1 % GEL Apply 4 g topically 4 (four) times daily. 150 g 3   docusate sodium  (COLACE) 100 MG capsule Take 100 mg by mouth  2 (two) times daily.     ezetimibe  (ZETIA ) 10 MG tablet TAKE 1 TABLET BY MOUTH EVERY DAY 90 tablet 1   lidocaine -prilocaine  (EMLA ) cream Apply 1 Application topically as needed. 30 g 0   omeprazole  (PRILOSEC) 40 MG capsule TAKE 1 CAPSULE BY MOUTH EVERY DAY 90 capsule 2   ondansetron  (ZOFRAN -ODT) 4 MG disintegrating tablet Take 1 tablet (4 mg total) by mouth every 8 (eight) hours as needed for nausea or vomiting. 20 tablet 0   polyethylene glycol (MIRALAX ) 17 g packet Take 17 g by mouth daily as needed for moderate constipation or  severe constipation (not helped with stool softner). 10 each 0   No current facility-administered medications for this visit.    SURGICAL HISTORY:  Past Surgical History:  Procedure Laterality Date   BRONCHIAL BIOPSY  07/26/2023   Procedure: BRONCHOSCOPY, WITH BIOPSY;  Surgeon: [name], MD;  Location[facility] ENDOSCOPY;  Service: Pulmonary;;   BRONCHIAL BRUSHINGS  07/26/2023   Procedure: BRONCHOSCOPY, WITH BRUSH BIOPSY;  Surgeon: [name], MD;  Location: [facility] ENDOSCOPY;  Service: Pulmonary;;   Excision of lipoma     IR IMAGING GUIDED PORT INSERTION  08/16/2023   PROSTATE BIOPSY Bilateral 03/10/2012   surgery to reduce turbinate and straighten deviated septum  2016   TONSILLECTOMY     TRANSCAROTID ARTERY REVASCULARIZATION  Right 05/14/2023   Procedure: TRANSCAROTID ARTERY REVASCULARIZATION (TCAR) UISNG 10mm X 40mm ENROUTE TRANSCAROTID STENT SYSTEM;  Surgeon: Lanis Fonda BRAVO, MD;  Location: [facility] OR;  Service: Vascular;  Laterality: Right;   VIDEO BRONCHOSCOPY  07/26/2023   Procedure: VIDEO BRONCHOSCOPY WITHOUT FLUORO;  Surgeon: [name], MD;  Location: [facility] ENDOSCOPY;  Service: Pulmonary;;    REVIEW OF SYSTEMS:   Review of Systems  Constitutional: Negative for appetite change, chills, fatigue, fever and unexpected weight change.  HENT:   Negative for mouth sores, nosebleeds, sore throat and trouble swallowing.   Eyes: Negative for eye problems and icterus.  Respiratory: Negative for cough, hemoptysis, shortness of breath and wheezing.   Cardiovascular: Negative for chest pain and leg swelling.  Gastrointestinal: Negative for abdominal pain, constipation, diarrhea, nausea and vomiting.  Genitourinary: Negative for bladder incontinence, difficulty urinating, dysuria, frequency and hematuria.   Musculoskeletal: Negative for back pain, gait problem, neck pain and neck stiffness.  Skin: Negative for itching and rash.  Neurological: Negative for dizziness, extremity weakness, gait  problem, headaches, light-headedness and seizures.  Hematological: Negative for adenopathy. Does not bruise/bleed easily.  Psychiatric/Behavioral: Negative for confusion, depression and sleep disturbance. The patient is not nervous/anxious.     PHYSICAL EXAMINATION:  There were no vitals taken for this visit.  ECOG PERFORMANCE STATUS: {CHL ONC ECOG [id number]  Physical Exam  Constitutional: Oriented to person, place, and time and well-developed, well-nourished, and in no distress. No distress.  HENT:  Head: Normocephalic and atraumatic.  Mouth/Throat: Oropharynx is clear and moist. No oropharyngeal exudate.  Eyes: Conjunctivae are normal. Right eye exhibits no discharge. Left eye exhibits no discharge. No scleral icterus.  Neck: Normal range of motion. Neck supple.  Cardiovascular: Normal rate, regular rhythm, normal heart sounds and intact distal pulses.   Pulmonary/Chest: Effort normal and breath sounds normal. No respiratory distress. No wheezes. No rales.  Abdominal: Soft. Bowel sounds are normal. Exhibits no distension and no mass. There is no tenderness.  Musculoskeletal: Normal range of motion. Exhibits no edema.  Lymphadenopathy:    No cervical adenopathy.  Neurological: Alert and oriented to person, place, and time. Exhibits  normal muscle tone. Gait normal. Coordination normal.  Skin: Skin is warm and dry. No rash noted. Not diaphoretic. No erythema. No pallor.  Psychiatric: Mood, memory and judgment normal.  Vitals reviewed.  LABORATORY DATA: Lab Results  Component Value Date   WBC 8.5 08/23/2023   HGB 11.0 (L) 08/23/2023   HCT 34.5 (L) 08/23/2023   MCV 85.8 08/23/2023   PLT 229 08/23/2023      Chemistry      Component Value Date/Time   NA 135 08/23/2023 1202   K 4.3 08/23/2023 1202   CL 101 08/23/2023 1202   CO2 28 08/23/2023 1202   BUN 26 (H) 08/23/2023 1202   CREATININE 0.68 08/23/2023 1202      Component Value Date/Time   CALCIUM  9.6 08/23/2023 1202    ALKPHOS 74 08/23/2023 1202   AST 15 08/23/2023 1202   ALT 12 08/23/2023 1202   BILITOT 0.4 08/23/2023 1202       RADIOGRAPHIC STUDIES:  MR Brain W Wo Contrast Result Date: 08/17/2023 CLINICAL DATA:  Provided history: Malignant neoplasm of lower lobe of right lung. Non-small cell lung cancer, staging. New onset dizziness. Recent history of stroke. EXAM: MRI HEAD WITHOUT AND WITH CONTRAST TECHNIQUE: Multiplanar, multiecho pulse sequences of the brain and surrounding structures were obtained without and with intravenous contrast. CONTRAST:  7mL GADAVIST  GADOBUTROL  1 MMOL/ML IV SOLN COMPARISON:  Non-contrast head CT and CT angiogram head/neck 05/10/2023. Brain MRI 05/09/2023. FINDINGS: Brain: Generalized cerebral atrophy. Known small chronic cortical/subcortical infarct within the right occipital lobe (PCA vascular territory). Small amount of chronic hemosiderin deposition at this site. Additional known small chronic infarcts within the cortical/subcortical right frontal and right parietal lobes, as well as right basal ganglia. Some of these infarcts were better appreciated on the prior brain MRI of 05/10/2023 (acute at that time). Moderate multifocal T2 FLAIR hyperintense signal abnormality elsewhere within the cerebral white matter, nonspecific but compatible chronic small vessel ischemic disease. Mild chronic small vessel ischemic changes also present within the pons. Small chronic infarct again demonstrated within the left cerebellar hemisphere. 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 There is no acute infarct. No evidence of an intracranial mass. No extra-axial fluid collection. No midline shift. No pathologic intracranial enhancement identified. Vascular: Maintained flow voids within the proximal large arterial vessels. Skull and upper  cervical spine: No focal worrisome marrow lesion. Mild C2-C3 grade 1 anterolisthesis. Sinuses/Orbits: No mass or acute finding within the imaged orbits. Prior bilateral ocular lens replacement. No significant paranasal sinus disease. IMPRESSION: 1.  No evidence of an acute intracranial abnormality. 2. No evidence of intracranial metastatic disease. 3. Parenchymal atrophy, chronic small vessel ischemic disease and known chronic infarcts, as described. 4. 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 Electronically Signed   By: Rockey Childs D.O.   On: 08/17/2023 08:37   IR IMAGING GUIDED PORT INSERTION Result Date: 08/16/2023 INDICATION: Port plmt RIGHT lung cancer. EXAM: IMPLANTED PORT A CATH PLACEMENT WITH ULTRASOUND AND FLUOROSCOPIC GUIDANCE MEDICATIONS: None ANESTHESIA/SEDATION: Moderate (conscious) sedation was employed during this procedure. A total of Versed  1 mg and Fentanyl  50 mcg was administered intravenously. Moderate Sedation Time: 15 minutes. The patient's level of consciousness and vital signs were monitored continuously by radiology nursing throughout the procedure under my direct supervision. FLUOROSCOPY: Radiation Exposure Index and estimated  peak skin dose (PSD); Reference air kerma (RAK), 4.4 mGy. Kerma-area product (KAP), 148.6 uGy*m. COMPLICATIONS: None immediate. PROCEDURE: The procedure, risks, benefits, and alternatives were explained to the patient. Questions regarding the procedure were encouraged and answered. The patient understands and consents to the procedure. The LEFT neck and chest were prepped with chlorhexidine  in a sterile fashion, and a sterile drape was applied covering the operative field. Maximum barrier sterile technique with sterile gowns and gloves were used for the procedure. A timeout was performed prior to the  initiation of the procedure. Local anesthesia was provided with 1% lidocaine  with epinephrine . After creating a small venotomy incision, a micropuncture kit was utilized to access the internal jugular vein under direct, real-time ultrasound guidance. Ultrasound image documentation was performed. The microwire was kinked to measure appropriate catheter length. A subcutaneous port pocket was then created along the upper chest wall utilizing a combination of sharp and blunt dissection. The pocket was irrigated with sterile saline. A single lumen power injectable port was chosen for placement. The 8 Fr catheter was tunneled from the port pocket site to the venotomy incision. The port was placed in the pocket. The external catheter was trimmed to appropriate length. At the venotomy, an 8 Fr peel-away sheath was placed over a guidewire under fluoroscopic guidance. The catheter was then placed through the sheath and the sheath was removed. Final catheter positioning was confirmed and documented with a fluoroscopic spot radiograph. The port was accessed with a Huber needle, aspirated and flushed with heparinized saline. The port pocket incision was closed with interrupted 3-0 Vicryl suture then Dermabond was applied, including at the venotomy incision. Dressings were placed. The patient tolerated the procedure well without immediate post procedural complication. IMPRESSION: Successful placement of a LEFT internal jugular approach power injectable Port-A-Cath. The tip of the catheter is positioned within the proximal RIGHT atrium. The catheter is ready for immediate use. Thom Hall, MD [facility] Electronically Signed   By: Thom Hall M.D.   On: 08/16/2023 12:41   NM PET Image Restag (PS) Skull Base To Thigh Result Date: 08/11/2023 CLINICAL DATA:  Subsequent treatment strategy for non-small cell lung cancer. Treatment after initial staging interrupted by  stroke. Restaging needed. EXAM: NUCLEAR MEDICINE PET SKULL BASE TO THIGH TECHNIQUE: 7.59 mCi F-18 FDG was injected intravenously. Full-ring PET imaging was performed from the skull base to thigh after the radiotracer. CT data was obtained and used for attenuation correction and anatomic localization. Fasting blood glucose: 103 mg/dl COMPARISON:  PET-CT 97/72/7974.  Chest CT 07/10/2023. FINDINGS: Mediastinal blood pool activity: SUV max 2.8 NECK: No hypermetabolic cervical lymph nodes are identified. No suspicious activity identified within the pharyngeal mucosal space. Incidental CT findings: Bilateral carotid atherosclerosis with a probable right carotid stent. CHEST: Large hypermetabolic right hilar mass is again noted, slightly enlarged from the previous PET-CT. This currently measures 8.4 x 6.2 cm on image 40/7 (previously 7.6 x 5.6 cm). There is fairly homogeneous hypermetabolic activity within this lesion with an SUV max of 22.3 (previously 26.2). As before, this mass involves all 3 lobes and partially encases the right upper, middle and lower lobe bronchi which remain patent. No peripheral hypermetabolic pulmonary activity or other suspicious pulmonary nodularity. No significant changes small subcarinal node measuring 7 mm short axis on image 74/4 with an SUV max of 5.2. No other separate hypermetabolic mediastinal lymph nodes. Incidental CT findings: Atherosclerosis of the aorta, great vessels and coronary arteries. Mild emphysema with scattered  subpleural reticulation in both lungs and mild asymmetric right upper lobe scarring. No lobar collapse. ABDOMEN/PELVIS: Similar small mildly hypermetabolic left adrenal nodule measuring 2.1 x 1.4 cm with an SUV max of 5.6 (previously 4.5). This nodule measures 27 HU, although is similar in size to prior CT's dating back to 04/27/2017, favoring an adenoma. No hypermetabolic activity within the right adrenal gland, liver, pancreas or spleen. There is no hypermetabolic  nodal activity in the abdomen or pelvis. Incidental CT findings: Contrast material within the renal collecting systems and bladder from preceding brain MRI. Aortic and branch vessel atherosclerosis with a 4.0 x 4.7 cm infrarenal abdominal aortic aneurysm, unchanged from previous PET-CT (remeasured). Treatment changes within the prostate gland. The urinary bladder is trabeculated. SKELETON: There is no hypermetabolic activity to suggest osseous metastatic disease. Incidental CT findings: Multilevel spondylosis. Grossly stable chronic compression deformities at L1 and L2, without hypermetabolic activity. IMPRESSION: 1. Little overall change compared with previous PET-CT of 04/22/2023. The large hypermetabolic right hilar mass has slightly enlarged in the interval with similar intense hypermetabolic activity, consistent with known primary bronchogenic carcinoma. 2. Stable small hypermetabolic subcarinal lymph node, suspicious for nodal metastasis. 3. Similar small mildly hypermetabolic left adrenal nodule, indeterminate although likely an adenoma based on stability from remote CT's. 4. No other evidence of distant metastatic disease. 5. Stable infrarenal abdominal aortic aneurysm measuring 4.7 cm. Recommend CTA or MRA, as appropriate, in 12 months and referral to vascular specialist. Reference: Journal of Vascular Surgery 67.1 (2018): 2-77. J Am Coll Radiol 2013;10:789-794. 6. Aortic Atherosclerosis (ICD10-I70.0) and Emphysema (ICD10-J43.9). Electronically Signed   By: Elsie Perone M.D.   On: 08/11/2023 18:18     ASSESSMENT/PLAN:  This is a very pleasant 89 year old Caucasian male diagnosed with stage IIIb (t3, N2b, M0) non-small cell lung cancer, squamous cell carcinoma.  The patient presented with a right upper lobe and lower lobe ***and with mediastinal lymph node involvement.  The patient was diagnosed in June 2025.  PD-L1 expression?  The patient is currently undergoing a course of concurrent  chemoradiation with carboplatin  for an AUC of 2 and paclitaxel  45 mg/m.  His first dose of treatment was on 08/23/2023. He is status post 1 cycle and tolerated it ***   Labs were reviewed.  Recommend that he***with cycle #2 today scheduled.  We will see him back for follow-up visit in 2 weeks for evaluation and repeat blood work before undergoing cycle #4.  Stroke?  Peripheral neuropathy?  Prostate cancer Previous diagnosis with one positive biopsy out of twelve samples.   Bladder cancer Bladder cancer with previous removal of a lesion.  The patient was advised to call immediately if he has any concerning symptoms in the interval. The patient voices understanding of current disease status and treatment options and is in agreement with the current care plan. All questions were answered. The patient knows to call the clinic with any problems, questions or concerns. We can certainly see the patient much sooner if necessary   No orders of the defined types were placed in this encounter.    I spent {CHL ONC TIME VISIT - DTPQU:[id number]} counseling the patient face to face. The total time spent in the appointment was {CHL ONC TIME VISIT - DTPQU:[id number]}.  Porshe Fleagle L Agamjot Kilgallon, PA-C 08/28/23

## 2023-08-30 ENCOUNTER — Inpatient Hospital Stay

## 2023-08-30 ENCOUNTER — Other Ambulatory Visit: Payer: Self-pay

## 2023-08-30 ENCOUNTER — Other Ambulatory Visit: Payer: Self-pay | Admitting: Physician Assistant

## 2023-08-30 ENCOUNTER — Ambulatory Visit
Admission: RE | Admit: 2023-08-30 | Discharge: 2023-08-30 | Disposition: A | Discharge status: Home / Self Care | Source: Ambulatory Visit | Attending: Radiation Oncology | Admitting: Radiation Oncology

## 2023-08-30 ENCOUNTER — Ambulatory Visit

## 2023-08-30 ENCOUNTER — Other Ambulatory Visit

## 2023-08-30 DIAGNOSIS — K2100 Gastro-esophageal reflux disease with esophagitis, without bleeding: Secondary | ICD-10-CM | POA: Diagnosis not present

## 2023-08-30 DIAGNOSIS — G629 Polyneuropathy, unspecified: Secondary | ICD-10-CM | POA: Diagnosis not present

## 2023-08-30 DIAGNOSIS — Z95828 Presence of other vascular implants and grafts: Secondary | ICD-10-CM | POA: Insufficient documentation

## 2023-08-30 DIAGNOSIS — C3411 Malignant neoplasm of upper lobe, right bronchus or lung: Secondary | ICD-10-CM | POA: Diagnosis not present

## 2023-08-30 DIAGNOSIS — M109 Gout, unspecified: Secondary | ICD-10-CM | POA: Diagnosis not present

## 2023-08-30 DIAGNOSIS — Z452 Encounter for adjustment and management of vascular access device: Secondary | ICD-10-CM | POA: Insufficient documentation

## 2023-08-30 DIAGNOSIS — C3431 Malignant neoplasm of lower lobe, right bronchus or lung: Secondary | ICD-10-CM | POA: Diagnosis not present

## 2023-08-30 DIAGNOSIS — Z510 Encounter for antineoplastic radiation therapy: Secondary | ICD-10-CM | POA: Diagnosis not present

## 2023-08-30 DIAGNOSIS — K222 Esophageal obstruction: Secondary | ICD-10-CM | POA: Diagnosis not present

## 2023-08-30 DIAGNOSIS — I10 Essential (primary) hypertension: Secondary | ICD-10-CM | POA: Diagnosis not present

## 2023-08-30 DIAGNOSIS — Z5111 Encounter for antineoplastic chemotherapy: Secondary | ICD-10-CM | POA: Diagnosis not present

## 2023-08-30 DIAGNOSIS — Z87891 Personal history of nicotine dependence: Secondary | ICD-10-CM | POA: Diagnosis not present

## 2023-08-30 DIAGNOSIS — K5900 Constipation, unspecified: Secondary | ICD-10-CM | POA: Insufficient documentation

## 2023-08-30 DIAGNOSIS — Z8551 Personal history of malignant neoplasm of bladder: Secondary | ICD-10-CM | POA: Insufficient documentation

## 2023-08-30 DIAGNOSIS — Z8546 Personal history of malignant neoplasm of prostate: Secondary | ICD-10-CM | POA: Insufficient documentation

## 2023-08-30 LAB — RAD ONC ARIA SESSION SUMMARY
Course Elapsed Days: 7
Plan Fractions Treated to Date: 5
Plan Prescribed Dose Per Fraction: 2 Gy
Plan Total Fractions Prescribed: 30
Plan Total Prescribed Dose: 60 Gy
Reference Point Dosage Given to Date: 10 Gy
Reference Point Session Dosage Given: 2 Gy
Session Number: 5

## 2023-08-30 LAB — CBC WITH DIFFERENTIAL (CANCER CENTER ONLY)
Abs Immature Granulocytes: 0.05 K/uL (ref 0.00–0.07)
Basophils Absolute: 0 K/uL (ref 0.0–0.1)
Basophils Relative: 1 %
Eosinophils Absolute: 0.2 K/uL (ref 0.0–0.5)
Eosinophils Relative: 3 %
HCT: 31.2 % — ABNORMAL LOW (ref 39.0–52.0)
Hemoglobin: 10 g/dL — ABNORMAL LOW (ref 13.0–17.0)
Immature Granulocytes: 1 %
Lymphocytes Relative: 10 %
Lymphs Abs: 0.6 K/uL — ABNORMAL LOW (ref 0.7–4.0)
MCH: 27.2 pg (ref 26.0–34.0)
MCHC: 32.1 g/dL (ref 30.0–36.0)
MCV: 85 fL (ref 80.0–100.0)
Monocytes Absolute: 0.5 K/uL (ref 0.1–1.0)
Monocytes Relative: 9 %
Neutro Abs: 4.9 K/uL (ref 1.7–7.7)
Neutrophils Relative %: 76 %
Platelet Count: 290 K/uL (ref 150–400)
RBC: 3.67 MIL/uL — ABNORMAL LOW (ref 4.22–5.81)
RDW: 16.2 % — ABNORMAL HIGH (ref 11.5–15.5)
WBC Count: 6.2 K/uL (ref 4.0–10.5)
nRBC: 0 % (ref 0.0–0.2)

## 2023-08-30 LAB — CMP (CANCER CENTER ONLY)
ALT: 10 U/L (ref 0–44)
AST: 12 U/L — ABNORMAL LOW (ref 15–41)
Albumin: 3.4 g/dL — ABNORMAL LOW (ref 3.5–5.0)
Alkaline Phosphatase: 58 U/L (ref 38–126)
Anion gap: 6 (ref 5–15)
BUN: 20 mg/dL (ref 8–23)
CO2: 28 mmol/L (ref 22–32)
Calcium: 9.2 mg/dL (ref 8.9–10.3)
Chloride: 99 mmol/L (ref 98–111)
Creatinine: 0.59 mg/dL — ABNORMAL LOW (ref 0.61–1.24)
GFR, Estimated: >60 mL/min (ref >60)
Glucose, Bld: 117 mg/dL — ABNORMAL HIGH (ref 70–99)
Potassium: 4 mmol/L (ref 3.5–5.1)
Sodium: 133 mmol/L — ABNORMAL LOW (ref 135–145)
Total Bilirubin: 0.4 mg/dL (ref 0.0–1.2)
Total Protein: 7 g/dL (ref 6.5–8.1)

## 2023-08-30 MED ORDER — SODIUM CHLORIDE 0.9% FLUSH
10.0000 mL | Freq: Once | INTRAVENOUS | Status: AC
  Administered 2023-08-30: 10 mL

## 2023-08-30 MED ORDER — HEPARIN SOD (PORK) LOCK FLUSH 100 UNIT/ML IV SOLN
500.0000 [IU] | Freq: Once | INTRAVENOUS | Status: AC
  Administered 2023-08-30: 500 [IU]

## 2023-08-31 ENCOUNTER — Ambulatory Visit: Admitting: Physician Assistant

## 2023-08-31 ENCOUNTER — Ambulatory Visit
Admission: RE | Admit: 2023-08-31 | Discharge: 2023-08-31 | Disposition: A | Discharge status: Home / Self Care | Source: Ambulatory Visit | Attending: Radiation Oncology

## 2023-08-31 ENCOUNTER — Other Ambulatory Visit: Payer: Self-pay

## 2023-08-31 ENCOUNTER — Ambulatory Visit: Admitting: Sports Medicine

## 2023-08-31 ENCOUNTER — Inpatient Hospital Stay

## 2023-08-31 ENCOUNTER — Inpatient Hospital Stay: Admitting: Internal Medicine

## 2023-08-31 ENCOUNTER — Other Ambulatory Visit

## 2023-08-31 VITALS — HR 99

## 2023-08-31 VITALS — BP 105/75 | HR 106 | Temp 97.2°F | Resp 16 | Wt 156.2 lb

## 2023-08-31 DIAGNOSIS — G629 Polyneuropathy, unspecified: Secondary | ICD-10-CM | POA: Diagnosis not present

## 2023-08-31 DIAGNOSIS — I10 Essential (primary) hypertension: Secondary | ICD-10-CM | POA: Diagnosis not present

## 2023-08-31 DIAGNOSIS — C3431 Malignant neoplasm of lower lobe, right bronchus or lung: Secondary | ICD-10-CM

## 2023-08-31 DIAGNOSIS — K2100 Gastro-esophageal reflux disease with esophagitis, without bleeding: Secondary | ICD-10-CM | POA: Diagnosis not present

## 2023-08-31 DIAGNOSIS — Z5111 Encounter for antineoplastic chemotherapy: Secondary | ICD-10-CM | POA: Insufficient documentation

## 2023-08-31 DIAGNOSIS — Z87891 Personal history of nicotine dependence: Secondary | ICD-10-CM | POA: Diagnosis not present

## 2023-08-31 DIAGNOSIS — K222 Esophageal obstruction: Secondary | ICD-10-CM | POA: Diagnosis not present

## 2023-08-31 DIAGNOSIS — C3411 Malignant neoplasm of upper lobe, right bronchus or lung: Secondary | ICD-10-CM | POA: Diagnosis not present

## 2023-08-31 DIAGNOSIS — M109 Gout, unspecified: Secondary | ICD-10-CM | POA: Diagnosis not present

## 2023-08-31 DIAGNOSIS — Z8551 Personal history of malignant neoplasm of bladder: Secondary | ICD-10-CM | POA: Diagnosis not present

## 2023-08-31 DIAGNOSIS — Z510 Encounter for antineoplastic radiation therapy: Secondary | ICD-10-CM | POA: Diagnosis not present

## 2023-08-31 LAB — RAD ONC ARIA SESSION SUMMARY
Course Elapsed Days: 8
Plan Fractions Treated to Date: 6
Plan Prescribed Dose Per Fraction: 2 Gy
Plan Total Fractions Prescribed: 30
Plan Total Prescribed Dose: 60 Gy
Reference Point Dosage Given to Date: 12 Gy
Reference Point Session Dosage Given: 2 Gy
Session Number: 6

## 2023-08-31 MED ORDER — DEXAMETHASONE SODIUM PHOSPHATE 10 MG/ML IJ SOLN
10.0000 mg | Freq: Once | INTRAMUSCULAR | Status: AC
  Administered 2023-08-31: 10 mg via INTRAVENOUS
  Filled 2023-08-31: qty 1

## 2023-08-31 MED ORDER — SODIUM CHLORIDE 0.9 % IV SOLN: INTRAVENOUS | Status: DC

## 2023-08-31 MED ORDER — PALONOSETRON HCL INJECTION 0.25 MG/5ML
0.2500 mg | Freq: Once | INTRAVENOUS | Status: AC
  Administered 2023-08-31: 0.25 mg via INTRAVENOUS
  Filled 2023-08-31: qty 5

## 2023-08-31 MED ORDER — SODIUM CHLORIDE 0.9 % IV SOLN
151.2000 mg | Freq: Once | INTRAVENOUS | Status: AC
  Administered 2023-08-31: 150 mg via INTRAVENOUS
  Filled 2023-08-31: qty 15

## 2023-08-31 MED ORDER — DIPHENHYDRAMINE HCL 50 MG/ML IJ SOLN
25.0000 mg | Freq: Once | INTRAMUSCULAR | Status: AC
  Administered 2023-08-31: 25 mg via INTRAVENOUS
  Filled 2023-08-31: qty 1

## 2023-08-31 MED ORDER — SODIUM CHLORIDE 0.9% FLUSH
10.0000 mL | INTRAVENOUS | Status: DC | PRN
  Administered 2023-08-31: 10 mL

## 2023-08-31 MED ORDER — FAMOTIDINE IN NACL 20-0.9 MG/50ML-% IV SOLN
20.0000 mg | Freq: Once | INTRAVENOUS | Status: AC
  Administered 2023-08-31: 20 mg via INTRAVENOUS
  Filled 2023-08-31: qty 50

## 2023-08-31 MED ORDER — HEPARIN SOD (PORK) LOCK FLUSH 100 UNIT/ML IV SOLN
500.0000 [IU] | Freq: Once | INTRAVENOUS | Status: AC | PRN
  Administered 2023-08-31: 500 [IU]

## 2023-08-31 MED ORDER — SODIUM CHLORIDE 0.9 % IV SOLN
45.0000 mg/m2 | Freq: Once | INTRAVENOUS | Status: AC
  Administered 2023-08-31: 84 mg via INTRAVENOUS
  Filled 2023-08-31: qty 14

## 2023-08-31 NOTE — Patient Instructions (Signed)
 [facility] - A DEPT OF [facility]   Discharge Instructions: Thank you for choosing [facility] to provide your oncology and hematology care.   If you have a lab appointment with the Cancer Center, please go directly to the Cancer Center and check in at the registration area.   Wear comfortable clothing and clothing appropriate for easy access to any Portacath or PICC line.   We strive to give you quality time with your provider. You may need to reschedule your appointment if you arrive late (15 or more minutes).  Arriving late affects you and other patients whose appointments are after yours.  Also, if you miss three or more appointments without notifying the office, you may be dismissed from the clinic at the provider's discretion.      For prescription refill requests, have your pharmacy contact our office and allow 72 hours for refills to be completed.    Today you received the following chemotherapy and/or immunotherapy agents: Paclitaxel (Taxol) and Carboplatin       To help prevent nausea and vomiting after your treatment, we encourage you to take your nausea medication as directed.  BELOW ARE SYMPTOMS THAT SHOULD BE REPORTED IMMEDIATELY: *FEVER GREATER THAN 100.4 F (38 C) OR HIGHER *CHILLS OR SWEATING *NAUSEA AND VOMITING THAT IS NOT CONTROLLED WITH YOUR NAUSEA MEDICATION *UNUSUAL SHORTNESS OF BREATH *UNUSUAL BRUISING OR BLEEDING *URINARY PROBLEMS (pain or burning when urinating, or frequent urination) *BOWEL PROBLEMS (unusual diarrhea, constipation, pain near the anus) TENDERNESS IN MOUTH AND THROAT WITH OR WITHOUT PRESENCE OF ULCERS (sore throat, sores in mouth, or a toothache) UNUSUAL RASH, SWELLING OR PAIN  UNUSUAL VAGINAL DISCHARGE OR ITCHING   Items with * indicate a potential emergency and should be followed up as soon as possible or go to the Emergency Department if any problems should occur.  Please show the CHEMOTHERAPY  ALERT CARD or IMMUNOTHERAPY ALERT CARD at check-in to the Emergency Department and triage nurse.  Should you have questions after your visit or need to cancel or reschedule your appointment, please contact [facility] - A DEPT OF [facility]  Dept: [phone]  and follow the prompts.  Office hours are 8:00 a.m. to 4:30 p.m. Monday - Friday. Please note that voicemails left after 4:00 p.m. may not be returned until the following business day.  We are closed weekends and major holidays. You have access to a nurse at all times for urgent questions. Please call the main number to the clinic Dept: [phone] and follow the prompts.   For any non-urgent questions, you may also contact your provider using MyChart. We now offer e-Visits for anyone 27 and older to request care online for non-urgent symptoms. For details visit [email or web address].   Also download the MyChart app! Go to the app store, search "MyChart", open the app, select , and log in with your MyChart username and password.

## 2023-09-01 ENCOUNTER — Ambulatory Visit
Admission: RE | Admit: 2023-09-01 | Discharge: 2023-09-01 | Disposition: A | Discharge status: Home / Self Care | Source: Ambulatory Visit | Attending: Radiation Oncology

## 2023-09-01 ENCOUNTER — Other Ambulatory Visit: Payer: Self-pay

## 2023-09-01 DIAGNOSIS — G629 Polyneuropathy, unspecified: Secondary | ICD-10-CM | POA: Diagnosis not present

## 2023-09-01 DIAGNOSIS — C3411 Malignant neoplasm of upper lobe, right bronchus or lung: Secondary | ICD-10-CM | POA: Diagnosis not present

## 2023-09-01 DIAGNOSIS — Z8551 Personal history of malignant neoplasm of bladder: Secondary | ICD-10-CM | POA: Diagnosis not present

## 2023-09-01 DIAGNOSIS — C3431 Malignant neoplasm of lower lobe, right bronchus or lung: Secondary | ICD-10-CM | POA: Diagnosis not present

## 2023-09-01 DIAGNOSIS — I10 Essential (primary) hypertension: Secondary | ICD-10-CM | POA: Diagnosis not present

## 2023-09-01 DIAGNOSIS — K222 Esophageal obstruction: Secondary | ICD-10-CM | POA: Diagnosis not present

## 2023-09-01 DIAGNOSIS — K2100 Gastro-esophageal reflux disease with esophagitis, without bleeding: Secondary | ICD-10-CM | POA: Diagnosis not present

## 2023-09-01 DIAGNOSIS — Z510 Encounter for antineoplastic radiation therapy: Secondary | ICD-10-CM | POA: Diagnosis not present

## 2023-09-01 DIAGNOSIS — M109 Gout, unspecified: Secondary | ICD-10-CM | POA: Diagnosis not present

## 2023-09-01 DIAGNOSIS — Z87891 Personal history of nicotine dependence: Secondary | ICD-10-CM | POA: Diagnosis not present

## 2023-09-01 DIAGNOSIS — Z5111 Encounter for antineoplastic chemotherapy: Secondary | ICD-10-CM | POA: Diagnosis not present

## 2023-09-01 LAB — RAD ONC ARIA SESSION SUMMARY
Course Elapsed Days: 9
Plan Fractions Treated to Date: 7
Plan Prescribed Dose Per Fraction: 2 Gy
Plan Total Fractions Prescribed: 30
Plan Total Prescribed Dose: 60 Gy
Reference Point Dosage Given to Date: 14 Gy
Reference Point Session Dosage Given: 2 Gy
Session Number: 7

## 2023-09-02 ENCOUNTER — Other Ambulatory Visit: Payer: Self-pay

## 2023-09-02 ENCOUNTER — Ambulatory Visit
Admission: RE | Admit: 2023-09-02 | Discharge: 2023-09-02 | Disposition: A | Discharge status: Home / Self Care | Source: Ambulatory Visit | Attending: Radiation Oncology | Admitting: Radiation Oncology

## 2023-09-02 DIAGNOSIS — Z8551 Personal history of malignant neoplasm of bladder: Secondary | ICD-10-CM | POA: Diagnosis not present

## 2023-09-02 DIAGNOSIS — K222 Esophageal obstruction: Secondary | ICD-10-CM | POA: Diagnosis not present

## 2023-09-02 DIAGNOSIS — C3411 Malignant neoplasm of upper lobe, right bronchus or lung: Secondary | ICD-10-CM | POA: Diagnosis not present

## 2023-09-02 DIAGNOSIS — I10 Essential (primary) hypertension: Secondary | ICD-10-CM | POA: Diagnosis not present

## 2023-09-02 DIAGNOSIS — C3431 Malignant neoplasm of lower lobe, right bronchus or lung: Secondary | ICD-10-CM | POA: Diagnosis not present

## 2023-09-02 DIAGNOSIS — R350 Frequency of micturition: Secondary | ICD-10-CM | POA: Diagnosis not present

## 2023-09-02 DIAGNOSIS — K2100 Gastro-esophageal reflux disease with esophagitis, without bleeding: Secondary | ICD-10-CM | POA: Diagnosis not present

## 2023-09-02 DIAGNOSIS — G629 Polyneuropathy, unspecified: Secondary | ICD-10-CM | POA: Diagnosis not present

## 2023-09-02 DIAGNOSIS — Z5111 Encounter for antineoplastic chemotherapy: Secondary | ICD-10-CM | POA: Diagnosis not present

## 2023-09-02 DIAGNOSIS — Z87891 Personal history of nicotine dependence: Secondary | ICD-10-CM | POA: Diagnosis not present

## 2023-09-02 DIAGNOSIS — Z510 Encounter for antineoplastic radiation therapy: Secondary | ICD-10-CM | POA: Diagnosis not present

## 2023-09-02 DIAGNOSIS — M109 Gout, unspecified: Secondary | ICD-10-CM | POA: Diagnosis not present

## 2023-09-02 LAB — RAD ONC ARIA SESSION SUMMARY
Course Elapsed Days: 10
Plan Fractions Treated to Date: 8
Plan Prescribed Dose Per Fraction: 2 Gy
Plan Total Fractions Prescribed: 30
Plan Total Prescribed Dose: 60 Gy
Reference Point Dosage Given to Date: 16 Gy
Reference Point Session Dosage Given: 2 Gy
Session Number: 8

## 2023-09-03 ENCOUNTER — Ambulatory Visit
Admission: RE | Admit: 2023-09-03 | Discharge: 2023-09-03 | Disposition: A | Discharge status: Home / Self Care | Source: Ambulatory Visit | Attending: Radiation Oncology

## 2023-09-03 ENCOUNTER — Other Ambulatory Visit: Payer: Self-pay

## 2023-09-03 DIAGNOSIS — Z510 Encounter for antineoplastic radiation therapy: Secondary | ICD-10-CM | POA: Diagnosis not present

## 2023-09-03 DIAGNOSIS — M109 Gout, unspecified: Secondary | ICD-10-CM | POA: Diagnosis not present

## 2023-09-03 DIAGNOSIS — Z87891 Personal history of nicotine dependence: Secondary | ICD-10-CM | POA: Diagnosis not present

## 2023-09-03 DIAGNOSIS — I10 Essential (primary) hypertension: Secondary | ICD-10-CM | POA: Diagnosis not present

## 2023-09-03 DIAGNOSIS — K222 Esophageal obstruction: Secondary | ICD-10-CM | POA: Diagnosis not present

## 2023-09-03 DIAGNOSIS — C3431 Malignant neoplasm of lower lobe, right bronchus or lung: Secondary | ICD-10-CM | POA: Diagnosis not present

## 2023-09-03 DIAGNOSIS — K2100 Gastro-esophageal reflux disease with esophagitis, without bleeding: Secondary | ICD-10-CM | POA: Diagnosis not present

## 2023-09-03 DIAGNOSIS — Z8551 Personal history of malignant neoplasm of bladder: Secondary | ICD-10-CM | POA: Diagnosis not present

## 2023-09-03 DIAGNOSIS — G629 Polyneuropathy, unspecified: Secondary | ICD-10-CM | POA: Diagnosis not present

## 2023-09-03 DIAGNOSIS — Z5111 Encounter for antineoplastic chemotherapy: Secondary | ICD-10-CM | POA: Diagnosis not present

## 2023-09-03 DIAGNOSIS — C3411 Malignant neoplasm of upper lobe, right bronchus or lung: Secondary | ICD-10-CM | POA: Diagnosis not present

## 2023-09-03 LAB — RAD ONC ARIA SESSION SUMMARY
Course Elapsed Days: 11
Plan Fractions Treated to Date: 9
Plan Prescribed Dose Per Fraction: 2 Gy
Plan Total Fractions Prescribed: 30
Plan Total Prescribed Dose: 60 Gy
Reference Point Dosage Given to Date: 18 Gy
Reference Point Session Dosage Given: 2 Gy
Session Number: 9

## 2023-09-06 ENCOUNTER — Encounter: Payer: Self-pay | Admitting: Internal Medicine

## 2023-09-06 ENCOUNTER — Inpatient Hospital Stay

## 2023-09-06 ENCOUNTER — Other Ambulatory Visit: Payer: Self-pay

## 2023-09-06 ENCOUNTER — Ambulatory Visit
Admission: RE | Admit: 2023-09-06 | Discharge: 2023-09-06 | Disposition: A | Discharge status: Home / Self Care | Source: Ambulatory Visit | Attending: Radiation Oncology

## 2023-09-06 VITALS — BP 141/81 | HR 95 | Temp 98.0°F | Resp 16 | Ht 72.0 in | Wt 152.5 lb

## 2023-09-06 DIAGNOSIS — C3431 Malignant neoplasm of lower lobe, right bronchus or lung: Secondary | ICD-10-CM

## 2023-09-06 DIAGNOSIS — M109 Gout, unspecified: Secondary | ICD-10-CM | POA: Diagnosis not present

## 2023-09-06 DIAGNOSIS — C3411 Malignant neoplasm of upper lobe, right bronchus or lung: Secondary | ICD-10-CM | POA: Diagnosis not present

## 2023-09-06 DIAGNOSIS — K222 Esophageal obstruction: Secondary | ICD-10-CM | POA: Diagnosis not present

## 2023-09-06 DIAGNOSIS — Z510 Encounter for antineoplastic radiation therapy: Secondary | ICD-10-CM | POA: Diagnosis not present

## 2023-09-06 DIAGNOSIS — Z8551 Personal history of malignant neoplasm of bladder: Secondary | ICD-10-CM | POA: Diagnosis not present

## 2023-09-06 DIAGNOSIS — G629 Polyneuropathy, unspecified: Secondary | ICD-10-CM | POA: Diagnosis not present

## 2023-09-06 DIAGNOSIS — Z5111 Encounter for antineoplastic chemotherapy: Secondary | ICD-10-CM | POA: Diagnosis not present

## 2023-09-06 DIAGNOSIS — I10 Essential (primary) hypertension: Secondary | ICD-10-CM | POA: Diagnosis not present

## 2023-09-06 DIAGNOSIS — Z95828 Presence of other vascular implants and grafts: Secondary | ICD-10-CM

## 2023-09-06 DIAGNOSIS — K2100 Gastro-esophageal reflux disease with esophagitis, without bleeding: Secondary | ICD-10-CM | POA: Diagnosis not present

## 2023-09-06 DIAGNOSIS — Z87891 Personal history of nicotine dependence: Secondary | ICD-10-CM | POA: Diagnosis not present

## 2023-09-06 LAB — RAD ONC ARIA SESSION SUMMARY
Course Elapsed Days: 14
Plan Fractions Treated to Date: 10
Plan Prescribed Dose Per Fraction: 2 Gy
Plan Total Fractions Prescribed: 30
Plan Total Prescribed Dose: 60 Gy
Reference Point Dosage Given to Date: 20 Gy
Reference Point Session Dosage Given: 2 Gy
Session Number: 10

## 2023-09-06 LAB — CBC WITH DIFFERENTIAL (CANCER CENTER ONLY)
Abs Immature Granulocytes: 0.05 K/uL (ref 0.00–0.07)
Basophils Absolute: 0.1 K/uL (ref 0.0–0.1)
Basophils Relative: 1 %
Eosinophils Absolute: 0.1 K/uL (ref 0.0–0.5)
Eosinophils Relative: 2 %
HCT: 31.4 % — ABNORMAL LOW (ref 39.0–52.0)
Hemoglobin: 10.1 g/dL — ABNORMAL LOW (ref 13.0–17.0)
Immature Granulocytes: 1 %
Lymphocytes Relative: 7 %
Lymphs Abs: 0.4 K/uL — ABNORMAL LOW (ref 0.7–4.0)
MCH: 27.3 pg (ref 26.0–34.0)
MCHC: 32.2 g/dL (ref 30.0–36.0)
MCV: 84.9 fL (ref 80.0–100.0)
Monocytes Absolute: 0.3 K/uL (ref 0.1–1.0)
Monocytes Relative: 6 %
Neutro Abs: 4.3 K/uL (ref 1.7–7.7)
Neutrophils Relative %: 83 %
Platelet Count: 264 K/uL (ref 150–400)
RBC: 3.7 MIL/uL — ABNORMAL LOW (ref 4.22–5.81)
RDW: 16.2 % — ABNORMAL HIGH (ref 11.5–15.5)
WBC Count: 5.2 K/uL (ref 4.0–10.5)
nRBC: 0 % (ref 0.0–0.2)

## 2023-09-06 LAB — CMP (CANCER CENTER ONLY)
ALT: 14 U/L (ref 0–44)
AST: 16 U/L (ref 15–41)
Albumin: 3.5 g/dL (ref 3.5–5.0)
Alkaline Phosphatase: 67 U/L (ref 38–126)
Anion gap: 5 (ref 5–15)
BUN: 23 mg/dL (ref 8–23)
CO2: 29 mmol/L (ref 22–32)
Calcium: 9.4 mg/dL (ref 8.9–10.3)
Chloride: 100 mmol/L (ref 98–111)
Creatinine: 0.63 mg/dL (ref 0.61–1.24)
GFR, Estimated: >60 mL/min (ref >60)
Glucose, Bld: 126 mg/dL — ABNORMAL HIGH (ref 70–99)
Potassium: 4.5 mmol/L (ref 3.5–5.1)
Sodium: 134 mmol/L — ABNORMAL LOW (ref 135–145)
Total Bilirubin: 0.3 mg/dL (ref 0.0–1.2)
Total Protein: 7.2 g/dL (ref 6.5–8.1)

## 2023-09-06 MED ORDER — DIPHENHYDRAMINE HCL 50 MG/ML IJ SOLN
25.0000 mg | Freq: Once | INTRAMUSCULAR | Status: AC
  Administered 2023-09-06: 25 mg via INTRAVENOUS
  Filled 2023-09-06: qty 1

## 2023-09-06 MED ORDER — PALONOSETRON HCL INJECTION 0.25 MG/5ML
0.2500 mg | Freq: Once | INTRAVENOUS | Status: AC
  Administered 2023-09-06: 0.25 mg via INTRAVENOUS
  Filled 2023-09-06: qty 5

## 2023-09-06 MED ORDER — FAMOTIDINE IN NACL 20-0.9 MG/50ML-% IV SOLN
20.0000 mg | Freq: Once | INTRAVENOUS | Status: AC
  Administered 2023-09-06: 20 mg via INTRAVENOUS
  Filled 2023-09-06: qty 50

## 2023-09-06 MED ORDER — SODIUM CHLORIDE 0.9 % IV SOLN: INTRAVENOUS | Status: DC

## 2023-09-06 MED ORDER — DEXAMETHASONE SODIUM PHOSPHATE 10 MG/ML IJ SOLN
10.0000 mg | Freq: Once | INTRAMUSCULAR | Status: AC
  Administered 2023-09-06: 10 mg via INTRAVENOUS
  Filled 2023-09-06: qty 1

## 2023-09-06 MED ORDER — SODIUM CHLORIDE 0.9 % IV SOLN
45.0000 mg/m2 | Freq: Once | INTRAVENOUS | Status: AC
  Administered 2023-09-06: 84 mg via INTRAVENOUS
  Filled 2023-09-06: qty 14

## 2023-09-06 MED ORDER — SODIUM CHLORIDE 0.9 % IV SOLN
151.2000 mg | Freq: Once | INTRAVENOUS | Status: AC
  Administered 2023-09-06: 150 mg via INTRAVENOUS
  Filled 2023-09-06: qty 15

## 2023-09-06 MED ORDER — SODIUM CHLORIDE 0.9% FLUSH
10.0000 mL | Freq: Once | INTRAVENOUS | Status: AC
  Administered 2023-09-06: 10 mL

## 2023-09-06 MED ORDER — SODIUM CHLORIDE 0.9% FLUSH
10.0000 mL | INTRAVENOUS | Status: DC | PRN
  Administered 2023-09-06: 10 mL

## 2023-09-06 MED ORDER — HEPARIN SOD (PORK) LOCK FLUSH 100 UNIT/ML IV SOLN
500.0000 [IU] | Freq: Once | INTRAVENOUS | Status: AC | PRN
  Administered 2023-09-06: 500 [IU]

## 2023-09-06 NOTE — Patient Instructions (Signed)
 [facility]  Discharge Instructions: Thank you for choosing [facility] to provide your oncology and hematology care.   If you have a lab appointment with the Cancer Center, please go directly to the Cancer Center and check in at the registration area.   Wear comfortable clothing and clothing appropriate for easy access to any Portacath or PICC line.   We strive to give you quality time with your provider. You may need to reschedule your appointment if you arrive late (15 or more minutes).  Arriving late affects you and other patients whose appointments are after yours.  Also, if you miss three or more appointments without notifying the office, you may be dismissed from the clinic at the provider's discretion.      For prescription refill requests, have your pharmacy contact our office and allow 72 hours for refills to be completed.    Today you received the following chemotherapy and/or immunotherapy agents paclitaxel, carboplatin      To help prevent nausea and vomiting after your treatment, we encourage you to take your nausea medication as directed.  BELOW ARE SYMPTOMS THAT SHOULD BE REPORTED IMMEDIATELY: *FEVER GREATER THAN 100.4 F (38 C) OR HIGHER *CHILLS OR SWEATING *NAUSEA AND VOMITING THAT IS NOT CONTROLLED WITH YOUR NAUSEA MEDICATION *UNUSUAL SHORTNESS OF BREATH *UNUSUAL BRUISING OR BLEEDING *URINARY PROBLEMS (pain or burning when urinating, or frequent urination) *BOWEL PROBLEMS (unusual diarrhea, constipation, pain near the anus) TENDERNESS IN MOUTH AND THROAT WITH OR WITHOUT PRESENCE OF ULCERS (sore throat, sores in mouth, or a toothache) UNUSUAL RASH, SWELLING OR PAIN  UNUSUAL VAGINAL DISCHARGE OR ITCHING   Items with * indicate a potential emergency and should be followed up as soon as possible or go to the Emergency Department if any problems should occur.  Please show the CHEMOTHERAPY ALERT CARD or  IMMUNOTHERAPY ALERT CARD at check-in to the Emergency Department and triage nurse.  Should you have questions after your visit or need to cancel or reschedule your appointment, please contact [facility]  Dept: [phone]  and follow the prompts.  Office hours are 8:00 a.m. to 4:30 p.m. Monday - Friday. Please note that voicemails left after 4:00 p.m. may not be returned until the following business day.  We are closed weekends and major holidays. You have access to a nurse at all times for urgent questions. Please call the main number to the clinic Dept: [phone] and follow the prompts.   For any non-urgent questions, you may also contact your provider using MyChart. We now offer e-Visits for anyone 44 and older to request care online for non-urgent symptoms. For details visit [email or web address].   Also download the MyChart app! Go to the app store, search "MyChart", open the app, select [place], and log in with your MyChart username and password.

## 2023-09-07 ENCOUNTER — Encounter: Admitting: Nutrition

## 2023-09-07 ENCOUNTER — Ambulatory Visit
Admission: RE | Admit: 2023-09-07 | Discharge: 2023-09-07 | Disposition: A | Discharge status: Home / Self Care | Source: Ambulatory Visit | Attending: Radiation Oncology

## 2023-09-07 ENCOUNTER — Other Ambulatory Visit: Payer: Self-pay

## 2023-09-07 DIAGNOSIS — C3431 Malignant neoplasm of lower lobe, right bronchus or lung: Secondary | ICD-10-CM | POA: Diagnosis not present

## 2023-09-07 DIAGNOSIS — I10 Essential (primary) hypertension: Secondary | ICD-10-CM | POA: Diagnosis not present

## 2023-09-07 DIAGNOSIS — G629 Polyneuropathy, unspecified: Secondary | ICD-10-CM | POA: Diagnosis not present

## 2023-09-07 DIAGNOSIS — Z5111 Encounter for antineoplastic chemotherapy: Secondary | ICD-10-CM | POA: Diagnosis not present

## 2023-09-07 DIAGNOSIS — K2100 Gastro-esophageal reflux disease with esophagitis, without bleeding: Secondary | ICD-10-CM | POA: Diagnosis not present

## 2023-09-07 DIAGNOSIS — Z8551 Personal history of malignant neoplasm of bladder: Secondary | ICD-10-CM | POA: Diagnosis not present

## 2023-09-07 DIAGNOSIS — Z87891 Personal history of nicotine dependence: Secondary | ICD-10-CM | POA: Diagnosis not present

## 2023-09-07 DIAGNOSIS — M109 Gout, unspecified: Secondary | ICD-10-CM | POA: Diagnosis not present

## 2023-09-07 DIAGNOSIS — C3411 Malignant neoplasm of upper lobe, right bronchus or lung: Secondary | ICD-10-CM | POA: Diagnosis not present

## 2023-09-07 DIAGNOSIS — K222 Esophageal obstruction: Secondary | ICD-10-CM | POA: Diagnosis not present

## 2023-09-07 DIAGNOSIS — Z510 Encounter for antineoplastic radiation therapy: Secondary | ICD-10-CM | POA: Diagnosis not present

## 2023-09-07 LAB — RAD ONC ARIA SESSION SUMMARY
Course Elapsed Days: 15
Plan Fractions Treated to Date: 11
Plan Prescribed Dose Per Fraction: 2 Gy
Plan Total Fractions Prescribed: 30
Plan Total Prescribed Dose: 60 Gy
Reference Point Dosage Given to Date: 22 Gy
Reference Point Session Dosage Given: 2 Gy
Session Number: 11

## 2023-09-08 ENCOUNTER — Other Ambulatory Visit: Payer: Self-pay

## 2023-09-08 ENCOUNTER — Ambulatory Visit
Admission: RE | Admit: 2023-09-08 | Discharge: 2023-09-08 | Disposition: A | Discharge status: Home / Self Care | Source: Ambulatory Visit | Attending: Radiation Oncology | Admitting: Radiation Oncology

## 2023-09-08 DIAGNOSIS — C3431 Malignant neoplasm of lower lobe, right bronchus or lung: Secondary | ICD-10-CM | POA: Diagnosis not present

## 2023-09-08 DIAGNOSIS — Z8551 Personal history of malignant neoplasm of bladder: Secondary | ICD-10-CM | POA: Diagnosis not present

## 2023-09-08 DIAGNOSIS — K2100 Gastro-esophageal reflux disease with esophagitis, without bleeding: Secondary | ICD-10-CM | POA: Diagnosis not present

## 2023-09-08 DIAGNOSIS — Z5111 Encounter for antineoplastic chemotherapy: Secondary | ICD-10-CM | POA: Diagnosis not present

## 2023-09-08 DIAGNOSIS — Z87891 Personal history of nicotine dependence: Secondary | ICD-10-CM | POA: Diagnosis not present

## 2023-09-08 DIAGNOSIS — I10 Essential (primary) hypertension: Secondary | ICD-10-CM | POA: Diagnosis not present

## 2023-09-08 DIAGNOSIS — K222 Esophageal obstruction: Secondary | ICD-10-CM | POA: Diagnosis not present

## 2023-09-08 DIAGNOSIS — C3411 Malignant neoplasm of upper lobe, right bronchus or lung: Secondary | ICD-10-CM | POA: Diagnosis not present

## 2023-09-08 DIAGNOSIS — M109 Gout, unspecified: Secondary | ICD-10-CM | POA: Diagnosis not present

## 2023-09-08 DIAGNOSIS — G629 Polyneuropathy, unspecified: Secondary | ICD-10-CM | POA: Diagnosis not present

## 2023-09-08 DIAGNOSIS — Z510 Encounter for antineoplastic radiation therapy: Secondary | ICD-10-CM | POA: Diagnosis not present

## 2023-09-08 LAB — RAD ONC ARIA SESSION SUMMARY
Course Elapsed Days: 16
Plan Fractions Treated to Date: 12
Plan Prescribed Dose Per Fraction: 2 Gy
Plan Total Fractions Prescribed: 30
Plan Total Prescribed Dose: 60 Gy
Reference Point Dosage Given to Date: 24 Gy
Reference Point Session Dosage Given: 2 Gy
Session Number: 12

## 2023-09-09 ENCOUNTER — Ambulatory Visit
Admission: RE | Admit: 2023-09-09 | Discharge: 2023-09-09 | Disposition: A | Discharge status: Home / Self Care | Source: Ambulatory Visit | Attending: Radiation Oncology | Admitting: Radiation Oncology

## 2023-09-09 ENCOUNTER — Other Ambulatory Visit: Payer: Self-pay

## 2023-09-09 DIAGNOSIS — Z5111 Encounter for antineoplastic chemotherapy: Secondary | ICD-10-CM | POA: Diagnosis not present

## 2023-09-09 DIAGNOSIS — K2100 Gastro-esophageal reflux disease with esophagitis, without bleeding: Secondary | ICD-10-CM | POA: Diagnosis not present

## 2023-09-09 DIAGNOSIS — C3431 Malignant neoplasm of lower lobe, right bronchus or lung: Secondary | ICD-10-CM | POA: Diagnosis not present

## 2023-09-09 DIAGNOSIS — G629 Polyneuropathy, unspecified: Secondary | ICD-10-CM | POA: Diagnosis not present

## 2023-09-09 DIAGNOSIS — Z510 Encounter for antineoplastic radiation therapy: Secondary | ICD-10-CM | POA: Diagnosis not present

## 2023-09-09 DIAGNOSIS — Z8551 Personal history of malignant neoplasm of bladder: Secondary | ICD-10-CM | POA: Diagnosis not present

## 2023-09-09 DIAGNOSIS — M109 Gout, unspecified: Secondary | ICD-10-CM | POA: Diagnosis not present

## 2023-09-09 DIAGNOSIS — I10 Essential (primary) hypertension: Secondary | ICD-10-CM | POA: Diagnosis not present

## 2023-09-09 DIAGNOSIS — K222 Esophageal obstruction: Secondary | ICD-10-CM | POA: Diagnosis not present

## 2023-09-09 DIAGNOSIS — Z87891 Personal history of nicotine dependence: Secondary | ICD-10-CM | POA: Diagnosis not present

## 2023-09-09 DIAGNOSIS — C3411 Malignant neoplasm of upper lobe, right bronchus or lung: Secondary | ICD-10-CM | POA: Diagnosis not present

## 2023-09-09 LAB — RAD ONC ARIA SESSION SUMMARY
Course Elapsed Days: 17
Plan Fractions Treated to Date: 13
Plan Prescribed Dose Per Fraction: 2 Gy
Plan Total Fractions Prescribed: 30
Plan Total Prescribed Dose: 60 Gy
Reference Point Dosage Given to Date: 26 Gy
Reference Point Session Dosage Given: 2 Gy
Session Number: 13

## 2023-09-10 ENCOUNTER — Ambulatory Visit
Admission: RE | Admit: 2023-09-10 | Discharge: 2023-09-10 | Disposition: A | Discharge status: Home / Self Care | Source: Ambulatory Visit | Attending: Radiation Oncology | Admitting: Radiation Oncology

## 2023-09-10 ENCOUNTER — Other Ambulatory Visit: Payer: Self-pay

## 2023-09-10 ENCOUNTER — Other Ambulatory Visit: Payer: Self-pay | Admitting: Radiation Oncology

## 2023-09-10 DIAGNOSIS — G629 Polyneuropathy, unspecified: Secondary | ICD-10-CM | POA: Diagnosis not present

## 2023-09-10 DIAGNOSIS — M109 Gout, unspecified: Secondary | ICD-10-CM | POA: Diagnosis not present

## 2023-09-10 DIAGNOSIS — Z8551 Personal history of malignant neoplasm of bladder: Secondary | ICD-10-CM | POA: Diagnosis not present

## 2023-09-10 DIAGNOSIS — C3431 Malignant neoplasm of lower lobe, right bronchus or lung: Secondary | ICD-10-CM | POA: Diagnosis not present

## 2023-09-10 DIAGNOSIS — C3411 Malignant neoplasm of upper lobe, right bronchus or lung: Secondary | ICD-10-CM | POA: Diagnosis not present

## 2023-09-10 DIAGNOSIS — K222 Esophageal obstruction: Secondary | ICD-10-CM | POA: Diagnosis not present

## 2023-09-10 DIAGNOSIS — K2100 Gastro-esophageal reflux disease with esophagitis, without bleeding: Secondary | ICD-10-CM | POA: Diagnosis not present

## 2023-09-10 DIAGNOSIS — I10 Essential (primary) hypertension: Secondary | ICD-10-CM | POA: Diagnosis not present

## 2023-09-10 DIAGNOSIS — Z5111 Encounter for antineoplastic chemotherapy: Secondary | ICD-10-CM | POA: Diagnosis not present

## 2023-09-10 DIAGNOSIS — Z510 Encounter for antineoplastic radiation therapy: Secondary | ICD-10-CM | POA: Diagnosis not present

## 2023-09-10 DIAGNOSIS — Z87891 Personal history of nicotine dependence: Secondary | ICD-10-CM | POA: Diagnosis not present

## 2023-09-10 LAB — RAD ONC ARIA SESSION SUMMARY
Course Elapsed Days: 18
Plan Fractions Treated to Date: 14
Plan Prescribed Dose Per Fraction: 2 Gy
Plan Total Fractions Prescribed: 30
Plan Total Prescribed Dose: 60 Gy
Reference Point Dosage Given to Date: 28 Gy
Reference Point Session Dosage Given: 2 Gy
Session Number: 14

## 2023-09-10 MED ORDER — SUCRALFATE 1 G PO TABS: 1.0000 g | ORAL_TABLET | Freq: Four times a day (QID) | ORAL | 2 refills | Status: DC

## 2023-09-13 ENCOUNTER — Ambulatory Visit
Admission: RE | Admit: 2023-09-13 | Discharge: 2023-09-13 | Disposition: A | Discharge status: Home / Self Care | Source: Ambulatory Visit | Attending: Radiation Oncology

## 2023-09-13 ENCOUNTER — Other Ambulatory Visit: Payer: Self-pay

## 2023-09-13 DIAGNOSIS — M109 Gout, unspecified: Secondary | ICD-10-CM | POA: Diagnosis not present

## 2023-09-13 DIAGNOSIS — Z510 Encounter for antineoplastic radiation therapy: Secondary | ICD-10-CM | POA: Diagnosis not present

## 2023-09-13 DIAGNOSIS — I10 Essential (primary) hypertension: Secondary | ICD-10-CM | POA: Diagnosis not present

## 2023-09-13 DIAGNOSIS — C3431 Malignant neoplasm of lower lobe, right bronchus or lung: Secondary | ICD-10-CM | POA: Diagnosis not present

## 2023-09-13 DIAGNOSIS — G629 Polyneuropathy, unspecified: Secondary | ICD-10-CM | POA: Diagnosis not present

## 2023-09-13 DIAGNOSIS — C3411 Malignant neoplasm of upper lobe, right bronchus or lung: Secondary | ICD-10-CM | POA: Diagnosis not present

## 2023-09-13 DIAGNOSIS — Z87891 Personal history of nicotine dependence: Secondary | ICD-10-CM | POA: Diagnosis not present

## 2023-09-13 DIAGNOSIS — Z8551 Personal history of malignant neoplasm of bladder: Secondary | ICD-10-CM | POA: Diagnosis not present

## 2023-09-13 DIAGNOSIS — K222 Esophageal obstruction: Secondary | ICD-10-CM | POA: Diagnosis not present

## 2023-09-13 DIAGNOSIS — Z5111 Encounter for antineoplastic chemotherapy: Secondary | ICD-10-CM | POA: Diagnosis not present

## 2023-09-13 DIAGNOSIS — K2100 Gastro-esophageal reflux disease with esophagitis, without bleeding: Secondary | ICD-10-CM | POA: Diagnosis not present

## 2023-09-13 LAB — RAD ONC ARIA SESSION SUMMARY
Course Elapsed Days: 21
Plan Fractions Treated to Date: 15
Plan Prescribed Dose Per Fraction: 2 Gy
Plan Total Fractions Prescribed: 30
Plan Total Prescribed Dose: 60 Gy
Reference Point Dosage Given to Date: 30 Gy
Reference Point Session Dosage Given: 2 Gy
Session Number: 15

## 2023-09-14 ENCOUNTER — Inpatient Hospital Stay

## 2023-09-14 ENCOUNTER — Inpatient Hospital Stay (HOSPITAL_BASED_OUTPATIENT_CLINIC_OR_DEPARTMENT_OTHER): Admitting: Internal Medicine

## 2023-09-14 ENCOUNTER — Ambulatory Visit
Admission: RE | Admit: 2023-09-14 | Discharge: 2023-09-14 | Disposition: A | Discharge status: Home / Self Care | Source: Ambulatory Visit | Attending: Radiation Oncology | Admitting: Radiation Oncology

## 2023-09-14 ENCOUNTER — Other Ambulatory Visit: Payer: Self-pay

## 2023-09-14 VITALS — BP 111/75 | HR 106 | Temp 97.2°F | Resp 16 | Wt 156.0 lb

## 2023-09-14 VITALS — HR 84

## 2023-09-14 DIAGNOSIS — C3431 Malignant neoplasm of lower lobe, right bronchus or lung: Secondary | ICD-10-CM

## 2023-09-14 DIAGNOSIS — Z5111 Encounter for antineoplastic chemotherapy: Secondary | ICD-10-CM | POA: Diagnosis not present

## 2023-09-14 DIAGNOSIS — C3411 Malignant neoplasm of upper lobe, right bronchus or lung: Secondary | ICD-10-CM | POA: Diagnosis not present

## 2023-09-14 DIAGNOSIS — K2100 Gastro-esophageal reflux disease with esophagitis, without bleeding: Secondary | ICD-10-CM | POA: Diagnosis not present

## 2023-09-14 DIAGNOSIS — I10 Essential (primary) hypertension: Secondary | ICD-10-CM | POA: Diagnosis not present

## 2023-09-14 DIAGNOSIS — G629 Polyneuropathy, unspecified: Secondary | ICD-10-CM | POA: Diagnosis not present

## 2023-09-14 DIAGNOSIS — K222 Esophageal obstruction: Secondary | ICD-10-CM | POA: Diagnosis not present

## 2023-09-14 DIAGNOSIS — Z95828 Presence of other vascular implants and grafts: Secondary | ICD-10-CM

## 2023-09-14 DIAGNOSIS — Z8551 Personal history of malignant neoplasm of bladder: Secondary | ICD-10-CM | POA: Diagnosis not present

## 2023-09-14 DIAGNOSIS — Z510 Encounter for antineoplastic radiation therapy: Secondary | ICD-10-CM | POA: Diagnosis not present

## 2023-09-14 DIAGNOSIS — Z87891 Personal history of nicotine dependence: Secondary | ICD-10-CM | POA: Diagnosis not present

## 2023-09-14 DIAGNOSIS — M109 Gout, unspecified: Secondary | ICD-10-CM | POA: Diagnosis not present

## 2023-09-14 LAB — CBC WITH DIFFERENTIAL (CANCER CENTER ONLY)
Abs Immature Granulocytes: 0.01 K/uL (ref 0.00–0.07)
Basophils Absolute: 0 K/uL (ref 0.0–0.1)
Basophils Relative: 1 %
Eosinophils Absolute: 0.1 K/uL (ref 0.0–0.5)
Eosinophils Relative: 2 %
HCT: 30.7 % — ABNORMAL LOW (ref 39.0–52.0)
Hemoglobin: 9.7 g/dL — ABNORMAL LOW (ref 13.0–17.0)
Immature Granulocytes: 0 %
Lymphocytes Relative: 8 %
Lymphs Abs: 0.3 K/uL — ABNORMAL LOW (ref 0.7–4.0)
MCH: 27.6 pg (ref 26.0–34.0)
MCHC: 31.6 g/dL (ref 30.0–36.0)
MCV: 87.5 fL (ref 80.0–100.0)
Monocytes Absolute: 0.4 K/uL (ref 0.1–1.0)
Monocytes Relative: 9 %
Neutro Abs: 3 K/uL (ref 1.7–7.7)
Neutrophils Relative %: 80 %
Platelet Count: 183 K/uL (ref 150–400)
RBC: 3.51 MIL/uL — ABNORMAL LOW (ref 4.22–5.81)
RDW: 17.5 % — ABNORMAL HIGH (ref 11.5–15.5)
WBC Count: 3.8 K/uL — ABNORMAL LOW (ref 4.0–10.5)
nRBC: 0 % (ref 0.0–0.2)

## 2023-09-14 LAB — RAD ONC ARIA SESSION SUMMARY
Course Elapsed Days: 22
Plan Fractions Treated to Date: 16
Plan Prescribed Dose Per Fraction: 2 Gy
Plan Total Fractions Prescribed: 30
Plan Total Prescribed Dose: 60 Gy
Reference Point Dosage Given to Date: 32 Gy
Reference Point Session Dosage Given: 2 Gy
Session Number: 16

## 2023-09-14 LAB — CMP (CANCER CENTER ONLY)
ALT: 15 U/L (ref 0–44)
AST: 15 U/L (ref 15–41)
Albumin: 3.6 g/dL (ref 3.5–5.0)
Alkaline Phosphatase: 73 U/L (ref 38–126)
Anion gap: 4 — ABNORMAL LOW (ref 5–15)
BUN: 25 mg/dL — ABNORMAL HIGH (ref 8–23)
CO2: 29 mmol/L (ref 22–32)
Calcium: 9 mg/dL (ref 8.9–10.3)
Chloride: 103 mmol/L (ref 98–111)
Creatinine: 0.58 mg/dL — ABNORMAL LOW (ref 0.61–1.24)
GFR, Estimated: >60 mL/min (ref >60)
Glucose, Bld: 108 mg/dL — ABNORMAL HIGH (ref 70–99)
Potassium: 4 mmol/L (ref 3.5–5.1)
Sodium: 136 mmol/L (ref 135–145)
Total Bilirubin: 0.3 mg/dL (ref 0.0–1.2)
Total Protein: 7.1 g/dL (ref 6.5–8.1)

## 2023-09-14 MED ORDER — SODIUM CHLORIDE 0.9% FLUSH
10.0000 mL | Freq: Once | INTRAVENOUS | Status: AC
  Administered 2023-09-14: 10 mL

## 2023-09-14 MED ORDER — DEXAMETHASONE SODIUM PHOSPHATE 10 MG/ML IJ SOLN
10.0000 mg | Freq: Once | INTRAMUSCULAR | Status: AC
  Administered 2023-09-14: 10 mg via INTRAVENOUS
  Filled 2023-09-14: qty 1

## 2023-09-14 MED ORDER — SODIUM CHLORIDE 0.9 % IV SOLN
151.2000 mg | Freq: Once | INTRAVENOUS | Status: AC
  Administered 2023-09-14: 150 mg via INTRAVENOUS
  Filled 2023-09-14: qty 15

## 2023-09-14 MED ORDER — SODIUM CHLORIDE 0.9 % IV SOLN
45.0000 mg/m2 | Freq: Once | INTRAVENOUS | Status: AC
  Administered 2023-09-14: 84 mg via INTRAVENOUS
  Filled 2023-09-14: qty 14

## 2023-09-14 MED ORDER — SODIUM CHLORIDE 0.9 % IV SOLN: INTRAVENOUS | Status: DC

## 2023-09-14 MED ORDER — FAMOTIDINE IN NACL 20-0.9 MG/50ML-% IV SOLN
20.0000 mg | Freq: Once | INTRAVENOUS | Status: AC
  Administered 2023-09-14: 20 mg via INTRAVENOUS
  Filled 2023-09-14: qty 50

## 2023-09-14 MED ORDER — PALONOSETRON HCL INJECTION 0.25 MG/5ML
0.2500 mg | Freq: Once | INTRAVENOUS | Status: AC
  Administered 2023-09-14: 0.25 mg via INTRAVENOUS
  Filled 2023-09-14: qty 5

## 2023-09-14 MED ORDER — CETIRIZINE HCL 10 MG/ML IV SOLN
10.0000 mg | Freq: Once | INTRAVENOUS | Status: AC
  Administered 2023-09-14: 10 mg via INTRAVENOUS
  Filled 2023-09-14: qty 1

## 2023-09-14 NOTE — Progress Notes (Signed)
 [facility] [phone]   [phone]  OFFICE PROGRESS NOTE  Stephen [name], FNP [address] [place] [address]  DIAGNOSIS: Stage IIIB (T3, N2b, M0) non-small cell lung cancer, squamous cell carcinoma presented with right upper arm lower lobe lung nodules in addition to mediastinal lymphadenopathy diagnosed in June 2025.    PDL1: 0%   PRIOR THERAPY: None   CURRENT THERAPY: Concurrent chemoradiation with weekly Carboplatin  for AUC 2 and Paclitaxel  45 mg/m2.  First dose on 08/23/2023.  Status post 3 cycles of treatment.  INTERVAL HISTORY: Stephen Meza 89 y.o. male returns to the clinic today for  Discussed the use of AI scribe software for clinical note transcription with the patient, who gave verbal consent to proceed.  History of Present Illness Stephen Meza is an 89 year old male with stage III B non-small cell lung cancer who presents for evaluation before starting cycle four of chemoradiation.  He was diagnosed with stage III B non-small cell lung cancer, specifically comasquamous cell carcinoma, in June 2025. He is currently undergoing concurrent chemoradiation with weekly carboplatin  and paclitaxel . He has completed three cycles and is here for evaluation before starting the fourth cycle.  He mentions that the treatment is 'going all right' but experiences 'a little splinting sometimes.' No other side effects are reported from the treatment at this time.  He denied having any current chest pain except for occasional pain on the right side of the chest.  He has no shortness of breath, cough or hemoptysis.  He is wheezing is better.  He denied having any nausea, vomiting, diarrhea or constipation.  He is here today for evaluation before starting cycle #4.     MEDICAL HISTORY: Past Medical History:  Diagnosis Date   Bladder cancer (HCC) 03/19/2016   DUODENITIS, WITHOUT HEMORRHAGE 07/14/2007   ERECTILE  DYSFUNCTION 07/14/2007   Esophageal stricture 03/19/2016   ESOPHAGITIS 07/14/2007   EXTERNAL HEMORRHOIDS 07/14/2007   GERD 07/14/2007   Gout    HYPERTENSION 07/14/2007   PERIPHERAL NEUROPATHY 07/14/2007   Peripheral neuropathy 04/06/2019   PERIPHERAL VASCULAR DISEASE 07/14/2007   PODAGRA 07/14/2007   Prostate cancer (HCC) 02/2012   pt had screening and then biopsy   Prostate cancer (HCC) 04/13/2012   Mild rise in PSA lead to prostate biopsy - 1:11 positive for adenocarcinoma. CT abd/pelvis was negative except for enlarged prostate.     Treatment - hormonal     Retinal tear of right eye 03/19/2016   Stroke (HCC) 05/09/2023   TOBACCO ABUSE, HX OF 02/12/2010   TONSILLECTOMY, HX OF 07/14/2007   TREMOR, ESSENTIAL 07/14/2007   VITAMIN B12 DEFICIENCY 07/14/2007    ALLERGIES:  is allergic to ciprofloxacin hcl, statins, and nitrofuran derivatives.  MEDICATIONS:  Current Outpatient Medications  Medication Sig Dispense Refill   albuterol  (VENTOLIN  HFA) 108 (90 Base) MCG/ACT inhaler Take 1 puff as needed for shortness of breath or wheezing, 8.5 g 2   allopurinol  (ZYLOPRIM ) 100 MG tablet TAKE 1 TABLET BY MOUTH EVERY DAY 90 tablet 4   aspirin  EC 81 MG tablet Take 1 tablet (81 mg total) by mouth daily. Okay to restart on 07/27/2023.  Swallow whole.     benzonatate  (TESSALON ) 100 MG capsule Take 1 capsule (100 mg total) by mouth every 6 (six) hours as needed for cough. 30 capsule 0   carvedilol  (COREG ) 3.125 MG tablet TAKE 1 TABLET BY MOUTH TWICE A DAY WITH A MEAL  180 tablet 1   clopidogrel  (PLAVIX ) 75 MG tablet TAKE 1 TABLET BY MOUTH EVERY DAY 30 tablet 2   Cyanocobalamin  (B-12) 3000 MCG CAPS Take 3,000 mcg by mouth daily.     diclofenac  Sodium (VOLTAREN ) 1 % GEL Apply 4 g topically 4 (four) times daily. 150 g 3   docusate sodium  (COLACE) 100 MG capsule Take 100 mg by mouth 2 (two) times daily.     ezetimibe  (ZETIA ) 10 MG tablet TAKE 1 TABLET BY MOUTH EVERY DAY 90 tablet 1   lidocaine -prilocaine   (EMLA ) cream Apply 1 Application topically as needed. 30 g 0   omeprazole  (PRILOSEC) 40 MG capsule TAKE 1 CAPSULE BY MOUTH EVERY DAY 90 capsule 2   ondansetron  (ZOFRAN -ODT) 4 MG disintegrating tablet Take 1 tablet (4 mg total) by mouth every 8 (eight) hours as needed for nausea or vomiting. 20 tablet 0   polyethylene glycol (MIRALAX ) 17 g packet Take 17 g by mouth daily as needed for moderate constipation or severe constipation (not helped with stool softner). 10 each 0   sucralfate  (CARAFATE ) 1 g tablet Take 1 tablet (1 g total) by mouth 4 (four) times daily. Dissolve each tablet in 15 cc water before use. 120 tablet 2   No current facility-administered medications for this visit.    SURGICAL HISTORY:  Past Surgical History:  Procedure Laterality Date   BRONCHIAL BIOPSY  07/26/2023   Procedure: BRONCHOSCOPY, WITH BIOPSY;  Surgeon: [name], MD;  Location: [facility];  Service: Pulmonary;;   BRONCHIAL BRUSHINGS  07/26/2023   Procedure: BRONCHOSCOPY, WITH BRUSH BIOPSY;  Surgeon: [name], MD;  Location: [facility];  Service: Pulmonary;;   Excision of lipoma     IR IMAGING GUIDED PORT INSERTION  08/16/2023   PROSTATE BIOPSY Bilateral 03/10/2012   surgery to reduce turbinate and straighten deviated septum  2016   TONSILLECTOMY     TRANSCAROTID ARTERY REVASCULARIZATION  Right 05/14/2023   Procedure: TRANSCAROTID ARTERY REVASCULARIZATION (TCAR) UISNG 10mm X 40mm ENROUTE TRANSCAROTID STENT SYSTEM;  Surgeon: Lanis Fonda BRAVO, MD;  Location: [facility] OR;  Service: Vascular;  Laterality: Right;   VIDEO BRONCHOSCOPY  07/26/2023   Procedure: VIDEO BRONCHOSCOPY WITHOUT FLUORO;  Surgeon: [name], MD;  Location: [facility];  Service: Pulmonary;;    REVIEW OF SYSTEMS:  Constitutional: positive for fatigue Eyes: negative Ears, nose, mouth, throat, and face: negative Respiratory: positive for pleurisy/chest pain Cardiovascular: negative Gastrointestinal:  negative Genitourinary:negative Integument/breast: negative Hematologic/lymphatic: negative Musculoskeletal:positive for muscle weakness Neurological: positive for paresthesia Behavioral/Psych: negative Endocrine: negative Allergic/Immunologic: negative   PHYSICAL EXAMINATION: General appearance: alert, cooperative, fatigued, and no distress Head: Normocephalic, without obvious abnormality, atraumatic Neck: no adenopathy, no JVD, supple, symmetrical, trachea midline, and thyroid  not enlarged, symmetric, no tenderness/mass/nodules Lymph nodes: Cervical, supraclavicular, and axillary nodes normal. Resp: clear to auscultation bilaterally Back: symmetric, no curvature. ROM normal. No CVA tenderness. Cardio: regular rate and rhythm, S1, S2 normal, no murmur, click, rub or gallop GI: soft, non-tender; bowel sounds normal; no masses,  no organomegaly Extremities: extremities normal, atraumatic, no cyanosis or edema Neurologic: Alert and oriented X 3, normal strength and tone. Normal symmetric reflexes. Normal coordination and gait  ECOG PERFORMANCE STATUS: 1 - Symptomatic but completely ambulatory  Blood pressure 111/75, pulse (!) 106, temperature (!) 97.2 F (36.2 C), temperature source Temporal, resp. rate 16, weight 156 lb (70.8 kg), SpO2 98%.  LABORATORY DATA: Lab Results  Component Value Date   WBC 3.8 (L) 09/14/2023   HGB 9.7 (L)  09/14/2023   HCT 30.7 (L) 09/14/2023   MCV 87.5 09/14/2023   PLT 183 09/14/2023      Chemistry      Component Value Date/Time   NA 136 09/14/2023 1039   K 4.0 09/14/2023 1039   CL 103 09/14/2023 1039   CO2 29 09/14/2023 1039   BUN 25 (H) 09/14/2023 1039   CREATININE 0.58 (L) 09/14/2023 1039      Component Value Date/Time   CALCIUM  9.0 09/14/2023 1039   ALKPHOS 73 09/14/2023 1039   AST 15 09/14/2023 1039   ALT 15 09/14/2023 1039   BILITOT 0.3 09/14/2023 1039       RADIOGRAPHIC STUDIES: IR IMAGING GUIDED PORT INSERTION Result Date:  08/16/2023 INDICATION: Port plmt RIGHT lung cancer. EXAM: IMPLANTED PORT A CATH PLACEMENT WITH ULTRASOUND AND FLUOROSCOPIC GUIDANCE MEDICATIONS: None ANESTHESIA/SEDATION: Moderate (conscious) sedation was employed during this procedure. A total of Versed  1 mg and Fentanyl  50 mcg was administered intravenously. Moderate Sedation Time: 15 minutes. The patient's level of consciousness and vital signs were monitored continuously by radiology nursing throughout the procedure under my direct supervision. FLUOROSCOPY: Radiation Exposure Index and estimated peak skin dose (PSD); Reference air kerma (RAK), 4.4 mGy. Kerma-area product (KAP), 148.6 uGy*m. COMPLICATIONS: None immediate. PROCEDURE: The procedure, risks, benefits, and alternatives were explained to the patient. Questions regarding the procedure were encouraged and answered. The patient understands and consents to the procedure. The LEFT neck and chest were prepped with chlorhexidine  in a sterile fashion, and a sterile drape was applied covering the operative field. Maximum barrier sterile technique with sterile gowns and gloves were used for the procedure. A timeout was performed prior to the initiation of the procedure. Local anesthesia was provided with 1% lidocaine  with epinephrine . After creating a small venotomy incision, a micropuncture kit was utilized to access the internal jugular vein under direct, real-time ultrasound guidance. Ultrasound image documentation was performed. The microwire was kinked to measure appropriate catheter length. A subcutaneous port pocket was then created along the upper chest wall utilizing a combination of sharp and blunt dissection. The pocket was irrigated with sterile saline. A single lumen power injectable port was chosen for placement. The 8 Fr catheter was tunneled from the port pocket site to the venotomy incision. The port was placed in the pocket. The external catheter was trimmed to appropriate length. At the  venotomy, an 8 Fr peel-away sheath was placed over a guidewire under fluoroscopic guidance. The catheter was then placed through the sheath and the sheath was removed. Final catheter positioning was confirmed and documented with a fluoroscopic spot radiograph. The port was accessed with a Huber needle, aspirated and flushed with heparinized saline. The port pocket incision was closed with interrupted 3-0 Vicryl suture then Dermabond was applied, including at the venotomy incision. Dressings were placed. The patient tolerated the procedure well without immediate post procedural complication. IMPRESSION: Successful placement of a LEFT internal jugular approach power injectable Port-A-Cath. The tip of the catheter is positioned within the proximal RIGHT atrium. The catheter is ready for immediate use. Thom Hall, MD [facility] Radiology Electronically Signed   By: Thom Hall M.D.   On: 08/16/2023 12:41    ASSESSMENT AND PLAN: This is a very pleasant 89 years old white male with Stage IIIB (T3, N2b, M0) non-small cell lung cancer, squamous cell carcinoma presented with right upper arm lower lobe lung nodules in addition to mediastinal lymphadenopathy diagnosed in June 2025.    PDL1: 0%  He is currently undergoing a course of concurrent chemoradiation with weekly carboplatin  for AUC of 2 and paclitaxel  45 Mg/M2 status post 3 cycles. Assessment and Plan Assessment & Plan Stage 3B non-small cell lung cancer, comasquamous cell carcinoma Diagnosed in June 2025, currently undergoing concurrent chemoradiation with weekly carboplatin  and paclitaxel . Completed three cycles with some splinting but no significant side effects. He has been tolerating this treatment fairly well with no concerning adverse effects except for the mild fatigue. I recommended for him to proceed with cycle #4 today as planned. I will see him back for follow-up visit in 2 weeks for evaluation  before starting cycle #6. The patient was advised to call immediately if he has any other concerning symptoms in the interval. The patient voices understanding of current disease status and treatment options and is in agreement with the current care plan.  All questions were answered. The patient knows to call the clinic with any problems, questions or concerns. We can certainly see the patient much sooner if necessary.  The total time spent in the appointment was 30 minutes including review of chart and various tests results, discussions about plan of care and coordination of care plan .  Disclaimer: This note was dictated with voice recognition software. Similar sounding words can inadvertently be transcribed and may not be corrected upon review.

## 2023-09-14 NOTE — Patient Instructions (Signed)
 [facility]  Discharge Instructions: Thank you for choosing [facility] to provide your oncology and hematology care.   If you have a lab appointment with the Cancer Center, please go directly to the Cancer Center and check in at the registration area.   Wear comfortable clothing and clothing appropriate for easy access to any Portacath or PICC line.   We strive to give you quality time with your provider. You may need to reschedule your appointment if you arrive late (15 or more minutes).  Arriving late affects you and other patients whose appointments are after yours.  Also, if you miss three or more appointments without notifying the office, you may be dismissed from the clinic at the provider's discretion.      For prescription refill requests, have your pharmacy contact our office and allow 72 hours for refills to be completed.    Today you received the following chemotherapy and/or immunotherapy agents paclitaxel, carboplatin      To help prevent nausea and vomiting after your treatment, we encourage you to take your nausea medication as directed.  BELOW ARE SYMPTOMS THAT SHOULD BE REPORTED IMMEDIATELY: *FEVER GREATER THAN 100.4 F (38 C) OR HIGHER *CHILLS OR SWEATING *NAUSEA AND VOMITING THAT IS NOT CONTROLLED WITH YOUR NAUSEA MEDICATION *UNUSUAL SHORTNESS OF BREATH *UNUSUAL BRUISING OR BLEEDING *URINARY PROBLEMS (pain or burning when urinating, or frequent urination) *BOWEL PROBLEMS (unusual diarrhea, constipation, pain near the anus) TENDERNESS IN MOUTH AND THROAT WITH OR WITHOUT PRESENCE OF ULCERS (sore throat, sores in mouth, or a toothache) UNUSUAL RASH, SWELLING OR PAIN  UNUSUAL VAGINAL DISCHARGE OR ITCHING   Items with * indicate a potential emergency and should be followed up as soon as possible or go to the Emergency Department if any problems should occur.  Please show the CHEMOTHERAPY ALERT CARD or  IMMUNOTHERAPY ALERT CARD at check-in to the Emergency Department and triage nurse.  Should you have questions after your visit or need to cancel or reschedule your appointment, please contact [facility]  Dept: [phone]  and follow the prompts.  Office hours are 8:00 a.m. to 4:30 p.m. Monday - Friday. Please note that voicemails left after 4:00 p.m. may not be returned until the following business day.  We are closed weekends and major holidays. You have access to a nurse at all times for urgent questions. Please call the main number to the clinic Dept: [phone] and follow the prompts.   For any non-urgent questions, you may also contact your provider using MyChart. We now offer e-Visits for anyone 44 and older to request care online for non-urgent symptoms. For details visit [email or web address].   Also download the MyChart app! Go to the app store, search "MyChart", open the app, select [place], and log in with your MyChart username and password.

## 2023-09-15 ENCOUNTER — Ambulatory Visit
Admission: RE | Admit: 2023-09-15 | Discharge: 2023-09-15 | Disposition: A | Discharge status: Home / Self Care | Source: Ambulatory Visit | Attending: Radiation Oncology

## 2023-09-15 ENCOUNTER — Other Ambulatory Visit: Payer: Self-pay

## 2023-09-15 DIAGNOSIS — K2100 Gastro-esophageal reflux disease with esophagitis, without bleeding: Secondary | ICD-10-CM | POA: Diagnosis not present

## 2023-09-15 DIAGNOSIS — Z5111 Encounter for antineoplastic chemotherapy: Secondary | ICD-10-CM | POA: Diagnosis not present

## 2023-09-15 DIAGNOSIS — M109 Gout, unspecified: Secondary | ICD-10-CM | POA: Diagnosis not present

## 2023-09-15 DIAGNOSIS — I10 Essential (primary) hypertension: Secondary | ICD-10-CM | POA: Diagnosis not present

## 2023-09-15 DIAGNOSIS — Z87891 Personal history of nicotine dependence: Secondary | ICD-10-CM | POA: Diagnosis not present

## 2023-09-15 DIAGNOSIS — Z8551 Personal history of malignant neoplasm of bladder: Secondary | ICD-10-CM | POA: Diagnosis not present

## 2023-09-15 DIAGNOSIS — K222 Esophageal obstruction: Secondary | ICD-10-CM | POA: Diagnosis not present

## 2023-09-15 DIAGNOSIS — C3431 Malignant neoplasm of lower lobe, right bronchus or lung: Secondary | ICD-10-CM | POA: Diagnosis not present

## 2023-09-15 DIAGNOSIS — G629 Polyneuropathy, unspecified: Secondary | ICD-10-CM | POA: Diagnosis not present

## 2023-09-15 DIAGNOSIS — Z510 Encounter for antineoplastic radiation therapy: Secondary | ICD-10-CM | POA: Diagnosis not present

## 2023-09-15 DIAGNOSIS — C3411 Malignant neoplasm of upper lobe, right bronchus or lung: Secondary | ICD-10-CM | POA: Diagnosis not present

## 2023-09-15 LAB — RAD ONC ARIA SESSION SUMMARY
Course Elapsed Days: 23
Plan Fractions Treated to Date: 17
Plan Prescribed Dose Per Fraction: 2 Gy
Plan Total Fractions Prescribed: 30
Plan Total Prescribed Dose: 60 Gy
Reference Point Dosage Given to Date: 34 Gy
Reference Point Session Dosage Given: 2 Gy
Session Number: 17

## 2023-09-16 ENCOUNTER — Other Ambulatory Visit: Payer: Self-pay

## 2023-09-16 ENCOUNTER — Ambulatory Visit
Admission: RE | Admit: 2023-09-16 | Discharge: 2023-09-16 | Disposition: A | Discharge status: Home / Self Care | Source: Ambulatory Visit | Attending: Radiation Oncology

## 2023-09-16 DIAGNOSIS — Z5111 Encounter for antineoplastic chemotherapy: Secondary | ICD-10-CM | POA: Diagnosis not present

## 2023-09-16 DIAGNOSIS — K222 Esophageal obstruction: Secondary | ICD-10-CM | POA: Diagnosis not present

## 2023-09-16 DIAGNOSIS — G629 Polyneuropathy, unspecified: Secondary | ICD-10-CM | POA: Diagnosis not present

## 2023-09-16 DIAGNOSIS — I10 Essential (primary) hypertension: Secondary | ICD-10-CM | POA: Diagnosis not present

## 2023-09-16 DIAGNOSIS — Z87891 Personal history of nicotine dependence: Secondary | ICD-10-CM | POA: Diagnosis not present

## 2023-09-16 DIAGNOSIS — C3411 Malignant neoplasm of upper lobe, right bronchus or lung: Secondary | ICD-10-CM | POA: Diagnosis not present

## 2023-09-16 DIAGNOSIS — M109 Gout, unspecified: Secondary | ICD-10-CM | POA: Diagnosis not present

## 2023-09-16 DIAGNOSIS — C3431 Malignant neoplasm of lower lobe, right bronchus or lung: Secondary | ICD-10-CM | POA: Diagnosis not present

## 2023-09-16 DIAGNOSIS — K2100 Gastro-esophageal reflux disease with esophagitis, without bleeding: Secondary | ICD-10-CM | POA: Diagnosis not present

## 2023-09-16 DIAGNOSIS — Z510 Encounter for antineoplastic radiation therapy: Secondary | ICD-10-CM | POA: Diagnosis not present

## 2023-09-16 DIAGNOSIS — Z8551 Personal history of malignant neoplasm of bladder: Secondary | ICD-10-CM | POA: Diagnosis not present

## 2023-09-16 LAB — RAD ONC ARIA SESSION SUMMARY
Course Elapsed Days: 24
Plan Fractions Treated to Date: 18
Plan Prescribed Dose Per Fraction: 2 Gy
Plan Total Fractions Prescribed: 30
Plan Total Prescribed Dose: 60 Gy
Reference Point Dosage Given to Date: 36 Gy
Reference Point Session Dosage Given: 2 Gy
Session Number: 18

## 2023-09-17 ENCOUNTER — Encounter: Payer: Self-pay | Admitting: Nutrition

## 2023-09-17 ENCOUNTER — Ambulatory Visit
Admission: RE | Admit: 2023-09-17 | Discharge: 2023-09-17 | Disposition: A | Discharge status: Home / Self Care | Source: Ambulatory Visit | Attending: Radiation Oncology | Admitting: Radiation Oncology

## 2023-09-17 ENCOUNTER — Inpatient Hospital Stay: Admitting: Nutrition

## 2023-09-17 ENCOUNTER — Other Ambulatory Visit: Payer: Self-pay

## 2023-09-17 DIAGNOSIS — K222 Esophageal obstruction: Secondary | ICD-10-CM | POA: Diagnosis not present

## 2023-09-17 DIAGNOSIS — Z510 Encounter for antineoplastic radiation therapy: Secondary | ICD-10-CM | POA: Diagnosis not present

## 2023-09-17 DIAGNOSIS — Z87891 Personal history of nicotine dependence: Secondary | ICD-10-CM | POA: Diagnosis not present

## 2023-09-17 DIAGNOSIS — G629 Polyneuropathy, unspecified: Secondary | ICD-10-CM | POA: Diagnosis not present

## 2023-09-17 DIAGNOSIS — M109 Gout, unspecified: Secondary | ICD-10-CM | POA: Diagnosis not present

## 2023-09-17 DIAGNOSIS — K2100 Gastro-esophageal reflux disease with esophagitis, without bleeding: Secondary | ICD-10-CM | POA: Diagnosis not present

## 2023-09-17 DIAGNOSIS — C3431 Malignant neoplasm of lower lobe, right bronchus or lung: Secondary | ICD-10-CM | POA: Diagnosis not present

## 2023-09-17 DIAGNOSIS — Z5111 Encounter for antineoplastic chemotherapy: Secondary | ICD-10-CM | POA: Diagnosis not present

## 2023-09-17 DIAGNOSIS — I10 Essential (primary) hypertension: Secondary | ICD-10-CM | POA: Diagnosis not present

## 2023-09-17 DIAGNOSIS — C3411 Malignant neoplasm of upper lobe, right bronchus or lung: Secondary | ICD-10-CM | POA: Diagnosis not present

## 2023-09-17 DIAGNOSIS — Z8551 Personal history of malignant neoplasm of bladder: Secondary | ICD-10-CM | POA: Diagnosis not present

## 2023-09-17 LAB — RAD ONC ARIA SESSION SUMMARY
Course Elapsed Days: 25
Plan Fractions Treated to Date: 19
Plan Prescribed Dose Per Fraction: 2 Gy
Plan Total Fractions Prescribed: 30
Plan Total Prescribed Dose: 60 Gy
Reference Point Dosage Given to Date: 38 Gy
Reference Point Session Dosage Given: 2 Gy
Session Number: 19

## 2023-09-17 NOTE — Progress Notes (Signed)
 Telephone call to remind patient of 2:30 nutrition appointment made. Patient/family wish to cancel this appointment and states there is no need for an appointment at this time.

## 2023-09-20 ENCOUNTER — Other Ambulatory Visit: Payer: Self-pay

## 2023-09-20 ENCOUNTER — Other Ambulatory Visit: Payer: Self-pay | Admitting: Acute Care

## 2023-09-20 ENCOUNTER — Ambulatory Visit
Admission: RE | Admit: 2023-09-20 | Discharge: 2023-09-20 | Disposition: A | Discharge status: Home / Self Care | Source: Ambulatory Visit | Attending: Radiation Oncology | Admitting: Radiation Oncology

## 2023-09-20 DIAGNOSIS — C3411 Malignant neoplasm of upper lobe, right bronchus or lung: Secondary | ICD-10-CM | POA: Diagnosis not present

## 2023-09-20 DIAGNOSIS — J069 Acute upper respiratory infection, unspecified: Secondary | ICD-10-CM

## 2023-09-20 DIAGNOSIS — Z87891 Personal history of nicotine dependence: Secondary | ICD-10-CM | POA: Diagnosis not present

## 2023-09-20 DIAGNOSIS — I10 Essential (primary) hypertension: Secondary | ICD-10-CM | POA: Diagnosis not present

## 2023-09-20 DIAGNOSIS — C3431 Malignant neoplasm of lower lobe, right bronchus or lung: Secondary | ICD-10-CM | POA: Diagnosis not present

## 2023-09-20 DIAGNOSIS — K2100 Gastro-esophageal reflux disease with esophagitis, without bleeding: Secondary | ICD-10-CM | POA: Diagnosis not present

## 2023-09-20 DIAGNOSIS — M109 Gout, unspecified: Secondary | ICD-10-CM | POA: Diagnosis not present

## 2023-09-20 DIAGNOSIS — Z510 Encounter for antineoplastic radiation therapy: Secondary | ICD-10-CM | POA: Diagnosis not present

## 2023-09-20 DIAGNOSIS — R051 Acute cough: Secondary | ICD-10-CM

## 2023-09-20 DIAGNOSIS — K222 Esophageal obstruction: Secondary | ICD-10-CM | POA: Diagnosis not present

## 2023-09-20 DIAGNOSIS — G629 Polyneuropathy, unspecified: Secondary | ICD-10-CM | POA: Diagnosis not present

## 2023-09-20 DIAGNOSIS — Z8551 Personal history of malignant neoplasm of bladder: Secondary | ICD-10-CM | POA: Diagnosis not present

## 2023-09-20 DIAGNOSIS — Z5111 Encounter for antineoplastic chemotherapy: Secondary | ICD-10-CM | POA: Diagnosis not present

## 2023-09-20 LAB — RAD ONC ARIA SESSION SUMMARY
Course Elapsed Days: 28
Plan Fractions Treated to Date: 20
Plan Prescribed Dose Per Fraction: 2 Gy
Plan Total Fractions Prescribed: 30
Plan Total Prescribed Dose: 60 Gy
Reference Point Dosage Given to Date: 40 Gy
Reference Point Session Dosage Given: 2 Gy
Session Number: 20

## 2023-09-21 ENCOUNTER — Inpatient Hospital Stay

## 2023-09-21 ENCOUNTER — Other Ambulatory Visit: Payer: Self-pay

## 2023-09-21 ENCOUNTER — Ambulatory Visit
Admission: RE | Admit: 2023-09-21 | Discharge: 2023-09-21 | Disposition: A | Discharge status: Home / Self Care | Source: Ambulatory Visit | Attending: Radiation Oncology | Admitting: Radiation Oncology

## 2023-09-21 VITALS — BP 119/88 | HR 103 | Temp 98.0°F | Resp 16

## 2023-09-21 DIAGNOSIS — C3431 Malignant neoplasm of lower lobe, right bronchus or lung: Secondary | ICD-10-CM

## 2023-09-21 DIAGNOSIS — Z8551 Personal history of malignant neoplasm of bladder: Secondary | ICD-10-CM | POA: Diagnosis not present

## 2023-09-21 DIAGNOSIS — Z5111 Encounter for antineoplastic chemotherapy: Secondary | ICD-10-CM | POA: Diagnosis not present

## 2023-09-21 DIAGNOSIS — I10 Essential (primary) hypertension: Secondary | ICD-10-CM | POA: Diagnosis not present

## 2023-09-21 DIAGNOSIS — K2100 Gastro-esophageal reflux disease with esophagitis, without bleeding: Secondary | ICD-10-CM | POA: Diagnosis not present

## 2023-09-21 DIAGNOSIS — C3411 Malignant neoplasm of upper lobe, right bronchus or lung: Secondary | ICD-10-CM | POA: Diagnosis not present

## 2023-09-21 DIAGNOSIS — Z510 Encounter for antineoplastic radiation therapy: Secondary | ICD-10-CM | POA: Diagnosis not present

## 2023-09-21 DIAGNOSIS — Z87891 Personal history of nicotine dependence: Secondary | ICD-10-CM | POA: Diagnosis not present

## 2023-09-21 DIAGNOSIS — Z95828 Presence of other vascular implants and grafts: Secondary | ICD-10-CM

## 2023-09-21 DIAGNOSIS — M109 Gout, unspecified: Secondary | ICD-10-CM | POA: Diagnosis not present

## 2023-09-21 DIAGNOSIS — G629 Polyneuropathy, unspecified: Secondary | ICD-10-CM | POA: Diagnosis not present

## 2023-09-21 DIAGNOSIS — K222 Esophageal obstruction: Secondary | ICD-10-CM | POA: Diagnosis not present

## 2023-09-21 LAB — CBC WITH DIFFERENTIAL (CANCER CENTER ONLY)
Abs Immature Granulocytes: 0.01 10*3/uL (ref 0.00–0.07)
Basophils Absolute: 0 10*3/uL (ref 0.0–0.1)
Basophils Relative: 1 %
Eosinophils Absolute: 0.1 10*3/uL (ref 0.0–0.5)
Eosinophils Relative: 2 %
HCT: 31.4 % — ABNORMAL LOW (ref 39.0–52.0)
Hemoglobin: 9.9 g/dL — ABNORMAL LOW (ref 13.0–17.0)
Immature Granulocytes: 0 %
Lymphocytes Relative: 8 %
Lymphs Abs: 0.3 10*3/uL — ABNORMAL LOW (ref 0.7–4.0)
MCH: 27.6 pg (ref 26.0–34.0)
MCHC: 31.5 g/dL (ref 30.0–36.0)
MCV: 87.5 fL (ref 80.0–100.0)
Monocytes Absolute: 0.3 10*3/uL (ref 0.1–1.0)
Monocytes Relative: 10 %
Neutro Abs: 2.8 10*3/uL (ref 1.7–7.7)
Neutrophils Relative %: 79 %
Platelet Count: 142 10*3/uL — ABNORMAL LOW (ref 150–400)
RBC: 3.59 MIL/uL — ABNORMAL LOW (ref 4.22–5.81)
RDW: 18.1 % — ABNORMAL HIGH (ref 11.5–15.5)
WBC Count: 3.5 10*3/uL — ABNORMAL LOW (ref 4.0–10.5)
nRBC: 0 % (ref 0.0–0.2)

## 2023-09-21 LAB — CMP (CANCER CENTER ONLY)
ALT: 10 U/L (ref 0–44)
AST: 16 U/L (ref 15–41)
Albumin: 3.7 g/dL (ref 3.5–5.0)
Alkaline Phosphatase: 69 U/L (ref 38–126)
Anion gap: 5 (ref 5–15)
BUN: 27 mg/dL — ABNORMAL HIGH (ref 8–23)
CO2: 28 mmol/L (ref 22–32)
Calcium: 9.1 mg/dL (ref 8.9–10.3)
Chloride: 101 mmol/L (ref 98–111)
Creatinine: 0.66 mg/dL (ref 0.61–1.24)
GFR, Estimated: >60 mL/min (ref >60)
Glucose, Bld: 116 mg/dL — ABNORMAL HIGH (ref 70–99)
Potassium: 4.2 mmol/L (ref 3.5–5.1)
Sodium: 134 mmol/L — ABNORMAL LOW (ref 135–145)
Total Bilirubin: 0.4 mg/dL (ref 0.0–1.2)
Total Protein: 7.2 g/dL (ref 6.5–8.1)

## 2023-09-21 LAB — RAD ONC ARIA SESSION SUMMARY
Course Elapsed Days: 29
Plan Fractions Treated to Date: 21
Plan Prescribed Dose Per Fraction: 2 Gy
Plan Total Fractions Prescribed: 30
Plan Total Prescribed Dose: 60 Gy
Reference Point Dosage Given to Date: 42 Gy
Reference Point Session Dosage Given: 2 Gy
Session Number: 21

## 2023-09-21 MED ORDER — HEPARIN SOD (PORK) LOCK FLUSH 100 UNIT/ML IV SOLN: 500.0000 [IU] | Freq: Once | INTRAVENOUS | Status: DC | PRN

## 2023-09-21 MED ORDER — FAMOTIDINE IN NACL 20-0.9 MG/50ML-% IV SOLN
20.0000 mg | Freq: Once | INTRAVENOUS | Status: AC
  Administered 2023-09-21: 20 mg via INTRAVENOUS
  Filled 2023-09-21: qty 50

## 2023-09-21 MED ORDER — CETIRIZINE HCL 10 MG/ML IV SOLN
10.0000 mg | Freq: Once | INTRAVENOUS | Status: AC
  Administered 2023-09-21: 10 mg via INTRAVENOUS
  Filled 2023-09-21: qty 1

## 2023-09-21 MED ORDER — DEXAMETHASONE SODIUM PHOSPHATE 10 MG/ML IJ SOLN
10.0000 mg | Freq: Once | INTRAMUSCULAR | Status: AC
  Administered 2023-09-21: 10 mg via INTRAVENOUS
  Filled 2023-09-21: qty 1

## 2023-09-21 MED ORDER — SODIUM CHLORIDE 0.9 % IV SOLN
151.2000 mg | Freq: Once | INTRAVENOUS | Status: AC
  Administered 2023-09-21: 150 mg via INTRAVENOUS
  Filled 2023-09-21: qty 15

## 2023-09-21 MED ORDER — SODIUM CHLORIDE 0.9 % IV SOLN
45.0000 mg/m2 | Freq: Once | INTRAVENOUS | Status: AC
  Administered 2023-09-21: 84 mg via INTRAVENOUS
  Filled 2023-09-21: qty 14

## 2023-09-21 MED ORDER — SODIUM CHLORIDE 0.9% FLUSH: 10.0000 mL | INTRAVENOUS | Status: DC | PRN

## 2023-09-21 MED ORDER — PALONOSETRON HCL INJECTION 0.25 MG/5ML
0.2500 mg | Freq: Once | INTRAVENOUS | Status: AC
  Administered 2023-09-21: 0.25 mg via INTRAVENOUS
  Filled 2023-09-21: qty 5

## 2023-09-21 MED ORDER — SODIUM CHLORIDE 0.9 % IV SOLN: INTRAVENOUS | Status: DC

## 2023-09-21 MED ORDER — SODIUM CHLORIDE 0.9% FLUSH
10.0000 mL | Freq: Once | INTRAVENOUS | Status: AC
  Administered 2023-09-21: 10 mL

## 2023-09-21 NOTE — Patient Instructions (Signed)
 [facility]  Discharge Instructions: Thank you for choosing [facility] to provide your oncology and hematology care.   If you have a lab appointment with the Cancer Center, please go directly to the Cancer Center and check in at the registration area.   Wear comfortable clothing and clothing appropriate for easy access to any Portacath or PICC line.   We strive to give you quality time with your provider. You may need to reschedule your appointment if you arrive late (15 or more minutes).  Arriving late affects you and other patients whose appointments are after yours.  Also, if you miss three or more appointments without notifying the office, you may be dismissed from the clinic at the provider's discretion.      For prescription refill requests, have your pharmacy contact our office and allow 72 hours for refills to be completed.    Today you received the following chemotherapy and/or immunotherapy agents taxol and carboplatin      To help prevent nausea and vomiting after your treatment, we encourage you to take your nausea medication as directed.  BELOW ARE SYMPTOMS THAT SHOULD BE REPORTED IMMEDIATELY: *FEVER GREATER THAN 100.4 F (38 C) OR HIGHER *CHILLS OR SWEATING *NAUSEA AND VOMITING THAT IS NOT CONTROLLED WITH YOUR NAUSEA MEDICATION *UNUSUAL SHORTNESS OF BREATH *UNUSUAL BRUISING OR BLEEDING *URINARY PROBLEMS (pain or burning when urinating, or frequent urination) *BOWEL PROBLEMS (unusual diarrhea, constipation, pain near the anus) TENDERNESS IN MOUTH AND THROAT WITH OR WITHOUT PRESENCE OF ULCERS (sore throat, sores in mouth, or a toothache) UNUSUAL RASH, SWELLING OR PAIN  UNUSUAL VAGINAL DISCHARGE OR ITCHING   Items with * indicate a potential emergency and should be followed up as soon as possible or go to the Emergency Department if any problems should occur.  Please show the CHEMOTHERAPY ALERT CARD or  IMMUNOTHERAPY ALERT CARD at check-in to the Emergency Department and triage nurse.  Should you have questions after your visit or need to cancel or reschedule your appointment, please contact [facility]  Dept: [phone]  and follow the prompts.  Office hours are 8:00 a.m. to 4:30 p.m. Monday - Friday. Please note that voicemails left after 4:00 p.m. may not be returned until the following business day.  We are closed weekends and major holidays. You have access to a nurse at all times for urgent questions. Please call the main number to the clinic Dept: [phone] and follow the prompts.   For any non-urgent questions, you may also contact your provider using MyChart. We now offer e-Visits for anyone 62 and older to request care online for non-urgent symptoms. For details visit [email or web address].   Also download the MyChart app! Go to the app store, search "MyChart", open the app, select [place], and log in with your MyChart username and password.

## 2023-09-22 ENCOUNTER — Other Ambulatory Visit: Payer: Self-pay

## 2023-09-22 ENCOUNTER — Ambulatory Visit
Admission: RE | Admit: 2023-09-22 | Discharge: 2023-09-22 | Disposition: A | Discharge status: Home / Self Care | Source: Ambulatory Visit | Attending: Radiation Oncology | Admitting: Radiation Oncology

## 2023-09-22 DIAGNOSIS — I10 Essential (primary) hypertension: Secondary | ICD-10-CM | POA: Diagnosis not present

## 2023-09-22 DIAGNOSIS — Z87891 Personal history of nicotine dependence: Secondary | ICD-10-CM | POA: Diagnosis not present

## 2023-09-22 DIAGNOSIS — Z5111 Encounter for antineoplastic chemotherapy: Secondary | ICD-10-CM | POA: Diagnosis not present

## 2023-09-22 DIAGNOSIS — K2100 Gastro-esophageal reflux disease with esophagitis, without bleeding: Secondary | ICD-10-CM | POA: Diagnosis not present

## 2023-09-22 DIAGNOSIS — M109 Gout, unspecified: Secondary | ICD-10-CM | POA: Diagnosis not present

## 2023-09-22 DIAGNOSIS — Z510 Encounter for antineoplastic radiation therapy: Secondary | ICD-10-CM | POA: Diagnosis not present

## 2023-09-22 DIAGNOSIS — K222 Esophageal obstruction: Secondary | ICD-10-CM | POA: Diagnosis not present

## 2023-09-22 DIAGNOSIS — C3431 Malignant neoplasm of lower lobe, right bronchus or lung: Secondary | ICD-10-CM | POA: Diagnosis not present

## 2023-09-22 DIAGNOSIS — C3411 Malignant neoplasm of upper lobe, right bronchus or lung: Secondary | ICD-10-CM | POA: Diagnosis not present

## 2023-09-22 DIAGNOSIS — G629 Polyneuropathy, unspecified: Secondary | ICD-10-CM | POA: Diagnosis not present

## 2023-09-22 DIAGNOSIS — Z8551 Personal history of malignant neoplasm of bladder: Secondary | ICD-10-CM | POA: Diagnosis not present

## 2023-09-22 LAB — RAD ONC ARIA SESSION SUMMARY
Course Elapsed Days: 30
Plan Fractions Treated to Date: 22
Plan Prescribed Dose Per Fraction: 2 Gy
Plan Total Fractions Prescribed: 30
Plan Total Prescribed Dose: 60 Gy
Reference Point Dosage Given to Date: 44 Gy
Reference Point Session Dosage Given: 2 Gy
Session Number: 22

## 2023-09-23 ENCOUNTER — Other Ambulatory Visit: Payer: Self-pay

## 2023-09-23 ENCOUNTER — Ambulatory Visit
Admission: RE | Admit: 2023-09-23 | Discharge: 2023-09-23 | Disposition: A | Discharge status: Home / Self Care | Source: Ambulatory Visit | Attending: Radiation Oncology

## 2023-09-23 DIAGNOSIS — C3431 Malignant neoplasm of lower lobe, right bronchus or lung: Secondary | ICD-10-CM | POA: Diagnosis not present

## 2023-09-23 DIAGNOSIS — Z5111 Encounter for antineoplastic chemotherapy: Secondary | ICD-10-CM | POA: Diagnosis not present

## 2023-09-23 DIAGNOSIS — K2100 Gastro-esophageal reflux disease with esophagitis, without bleeding: Secondary | ICD-10-CM | POA: Diagnosis not present

## 2023-09-23 DIAGNOSIS — I10 Essential (primary) hypertension: Secondary | ICD-10-CM | POA: Diagnosis not present

## 2023-09-23 DIAGNOSIS — G629 Polyneuropathy, unspecified: Secondary | ICD-10-CM | POA: Diagnosis not present

## 2023-09-23 DIAGNOSIS — Z87891 Personal history of nicotine dependence: Secondary | ICD-10-CM | POA: Diagnosis not present

## 2023-09-23 DIAGNOSIS — K222 Esophageal obstruction: Secondary | ICD-10-CM | POA: Diagnosis not present

## 2023-09-23 DIAGNOSIS — M109 Gout, unspecified: Secondary | ICD-10-CM | POA: Diagnosis not present

## 2023-09-23 DIAGNOSIS — Z8551 Personal history of malignant neoplasm of bladder: Secondary | ICD-10-CM | POA: Diagnosis not present

## 2023-09-23 DIAGNOSIS — Z510 Encounter for antineoplastic radiation therapy: Secondary | ICD-10-CM | POA: Diagnosis not present

## 2023-09-23 DIAGNOSIS — C3411 Malignant neoplasm of upper lobe, right bronchus or lung: Secondary | ICD-10-CM | POA: Diagnosis not present

## 2023-09-23 LAB — RAD ONC ARIA SESSION SUMMARY
Course Elapsed Days: 31
Plan Fractions Treated to Date: 23
Plan Prescribed Dose Per Fraction: 2 Gy
Plan Total Fractions Prescribed: 30
Plan Total Prescribed Dose: 60 Gy
Reference Point Dosage Given to Date: 46 Gy
Reference Point Session Dosage Given: 2 Gy
Session Number: 23

## 2023-09-24 ENCOUNTER — Ambulatory Visit
Admission: RE | Admit: 2023-09-24 | Discharge: 2023-09-24 | Disposition: A | Discharge status: Home / Self Care | Source: Ambulatory Visit | Attending: Radiation Oncology | Admitting: Radiation Oncology

## 2023-09-24 ENCOUNTER — Other Ambulatory Visit: Payer: Self-pay

## 2023-09-24 DIAGNOSIS — C3431 Malignant neoplasm of lower lobe, right bronchus or lung: Secondary | ICD-10-CM | POA: Insufficient documentation

## 2023-09-24 DIAGNOSIS — J449 Chronic obstructive pulmonary disease, unspecified: Secondary | ICD-10-CM | POA: Diagnosis not present

## 2023-09-24 DIAGNOSIS — K5900 Constipation, unspecified: Secondary | ICD-10-CM | POA: Diagnosis not present

## 2023-09-24 DIAGNOSIS — Z8551 Personal history of malignant neoplasm of bladder: Secondary | ICD-10-CM | POA: Diagnosis not present

## 2023-09-24 DIAGNOSIS — Z5111 Encounter for antineoplastic chemotherapy: Secondary | ICD-10-CM | POA: Diagnosis not present

## 2023-09-24 DIAGNOSIS — Z452 Encounter for adjustment and management of vascular access device: Secondary | ICD-10-CM | POA: Diagnosis not present

## 2023-09-24 DIAGNOSIS — R7989 Other specified abnormal findings of blood chemistry: Secondary | ICD-10-CM | POA: Diagnosis not present

## 2023-09-24 DIAGNOSIS — Z510 Encounter for antineoplastic radiation therapy: Secondary | ICD-10-CM | POA: Diagnosis not present

## 2023-09-24 DIAGNOSIS — Z87891 Personal history of nicotine dependence: Secondary | ICD-10-CM | POA: Diagnosis not present

## 2023-09-24 DIAGNOSIS — Z8546 Personal history of malignant neoplasm of prostate: Secondary | ICD-10-CM | POA: Diagnosis not present

## 2023-09-24 DIAGNOSIS — R630 Anorexia: Secondary | ICD-10-CM | POA: Diagnosis not present

## 2023-09-24 DIAGNOSIS — D701 Agranulocytosis secondary to cancer chemotherapy: Secondary | ICD-10-CM | POA: Diagnosis not present

## 2023-09-24 LAB — RAD ONC ARIA SESSION SUMMARY
Course Elapsed Days: 32
Plan Fractions Treated to Date: 24
Plan Prescribed Dose Per Fraction: 2 Gy
Plan Total Fractions Prescribed: 30
Plan Total Prescribed Dose: 60 Gy
Reference Point Dosage Given to Date: 48 Gy
Reference Point Session Dosage Given: 2 Gy
Session Number: 24

## 2023-09-27 ENCOUNTER — Encounter

## 2023-09-27 ENCOUNTER — Other Ambulatory Visit: Payer: Self-pay

## 2023-09-27 ENCOUNTER — Inpatient Hospital Stay

## 2023-09-27 ENCOUNTER — Ambulatory Visit
Admission: RE | Admit: 2023-09-27 | Discharge: 2023-09-27 | Disposition: A | Discharge status: Home / Self Care | Source: Ambulatory Visit | Attending: Radiation Oncology

## 2023-09-27 ENCOUNTER — Inpatient Hospital Stay: Admitting: Internal Medicine

## 2023-09-27 ENCOUNTER — Inpatient Hospital Stay: Attending: Internal Medicine

## 2023-09-27 VITALS — BP 107/73 | HR 93 | Resp 18

## 2023-09-27 VITALS — BP 106/87 | HR 103 | Temp 98.0°F | Resp 16 | Ht 72.0 in | Wt 156.0 lb

## 2023-09-27 DIAGNOSIS — Z5111 Encounter for antineoplastic chemotherapy: Secondary | ICD-10-CM | POA: Diagnosis not present

## 2023-09-27 DIAGNOSIS — J449 Chronic obstructive pulmonary disease, unspecified: Secondary | ICD-10-CM | POA: Insufficient documentation

## 2023-09-27 DIAGNOSIS — K5900 Constipation, unspecified: Secondary | ICD-10-CM | POA: Insufficient documentation

## 2023-09-27 DIAGNOSIS — R7989 Other specified abnormal findings of blood chemistry: Secondary | ICD-10-CM | POA: Insufficient documentation

## 2023-09-27 DIAGNOSIS — Z95828 Presence of other vascular implants and grafts: Secondary | ICD-10-CM

## 2023-09-27 DIAGNOSIS — Z452 Encounter for adjustment and management of vascular access device: Secondary | ICD-10-CM | POA: Insufficient documentation

## 2023-09-27 DIAGNOSIS — C3490 Malignant neoplasm of unspecified part of unspecified bronchus or lung: Secondary | ICD-10-CM | POA: Diagnosis not present

## 2023-09-27 DIAGNOSIS — Z510 Encounter for antineoplastic radiation therapy: Secondary | ICD-10-CM | POA: Diagnosis not present

## 2023-09-27 DIAGNOSIS — D701 Agranulocytosis secondary to cancer chemotherapy: Secondary | ICD-10-CM | POA: Insufficient documentation

## 2023-09-27 DIAGNOSIS — C3431 Malignant neoplasm of lower lobe, right bronchus or lung: Secondary | ICD-10-CM

## 2023-09-27 DIAGNOSIS — Z8551 Personal history of malignant neoplasm of bladder: Secondary | ICD-10-CM | POA: Insufficient documentation

## 2023-09-27 DIAGNOSIS — Z8546 Personal history of malignant neoplasm of prostate: Secondary | ICD-10-CM | POA: Insufficient documentation

## 2023-09-27 DIAGNOSIS — Z87891 Personal history of nicotine dependence: Secondary | ICD-10-CM | POA: Diagnosis not present

## 2023-09-27 DIAGNOSIS — R630 Anorexia: Secondary | ICD-10-CM | POA: Insufficient documentation

## 2023-09-27 LAB — CMP (CANCER CENTER ONLY)
ALT: 14 U/L (ref 0–44)
AST: 19 U/L (ref 15–41)
Albumin: 3.8 g/dL (ref 3.5–5.0)
Alkaline Phosphatase: 61 U/L (ref 38–126)
Anion gap: 5 (ref 5–15)
BUN: 24 mg/dL — ABNORMAL HIGH (ref 8–23)
CO2: 28 mmol/L (ref 22–32)
Calcium: 8.9 mg/dL (ref 8.9–10.3)
Chloride: 102 mmol/L (ref 98–111)
Creatinine: 0.61 mg/dL (ref 0.61–1.24)
GFR, Estimated: >60 mL/min (ref >60)
Glucose, Bld: 110 mg/dL — ABNORMAL HIGH (ref 70–99)
Potassium: 3.9 mmol/L (ref 3.5–5.1)
Sodium: 135 mmol/L (ref 135–145)
Total Bilirubin: 0.4 mg/dL (ref 0.0–1.2)
Total Protein: 6.9 g/dL (ref 6.5–8.1)

## 2023-09-27 LAB — CBC WITH DIFFERENTIAL (CANCER CENTER ONLY)
Abs Immature Granulocytes: 0.02 K/uL (ref 0.00–0.07)
Basophils Absolute: 0 K/uL (ref 0.0–0.1)
Basophils Relative: 1 %
Eosinophils Absolute: 0 K/uL (ref 0.0–0.5)
Eosinophils Relative: 1 %
HCT: 28.1 % — ABNORMAL LOW (ref 39.0–52.0)
Hemoglobin: 9.2 g/dL — ABNORMAL LOW (ref 13.0–17.0)
Immature Granulocytes: 1 %
Lymphocytes Relative: 10 %
Lymphs Abs: 0.2 K/uL — ABNORMAL LOW (ref 0.7–4.0)
MCH: 28.7 pg (ref 26.0–34.0)
MCHC: 32.7 g/dL (ref 30.0–36.0)
MCV: 87.5 fL (ref 80.0–100.0)
Monocytes Absolute: 0.1 K/uL (ref 0.1–1.0)
Monocytes Relative: 6 %
Neutro Abs: 1.9 K/uL (ref 1.7–7.7)
Neutrophils Relative %: 81 %
Platelet Count: 102 K/uL — ABNORMAL LOW (ref 150–400)
RBC: 3.21 MIL/uL — ABNORMAL LOW (ref 4.22–5.81)
RDW: 18.7 % — ABNORMAL HIGH (ref 11.5–15.5)
WBC Count: 2.3 K/uL — ABNORMAL LOW (ref 4.0–10.5)
nRBC: 0 % (ref 0.0–0.2)

## 2023-09-27 LAB — RAD ONC ARIA SESSION SUMMARY
Course Elapsed Days: 35
Plan Fractions Treated to Date: 25
Plan Prescribed Dose Per Fraction: 2 Gy
Plan Total Fractions Prescribed: 30
Plan Total Prescribed Dose: 60 Gy
Reference Point Dosage Given to Date: 50 Gy
Reference Point Session Dosage Given: 2 Gy
Session Number: 25

## 2023-09-27 MED ORDER — CETIRIZINE HCL 10 MG/ML IV SOLN
10.0000 mg | Freq: Once | INTRAVENOUS | Status: AC
  Administered 2023-09-27: 10 mg via INTRAVENOUS
  Filled 2023-09-27: qty 1

## 2023-09-27 MED ORDER — SODIUM CHLORIDE 0.9% FLUSH: 10.0000 mL | INTRAVENOUS | Status: DC | PRN

## 2023-09-27 MED ORDER — FAMOTIDINE IN NACL 20-0.9 MG/50ML-% IV SOLN
20.0000 mg | Freq: Once | INTRAVENOUS | Status: AC
  Administered 2023-09-27: 20 mg via INTRAVENOUS
  Filled 2023-09-27: qty 50

## 2023-09-27 MED ORDER — SODIUM CHLORIDE 0.9 % IV SOLN
151.2000 mg | Freq: Once | INTRAVENOUS | Status: AC
  Administered 2023-09-27: 150 mg via INTRAVENOUS
  Filled 2023-09-27: qty 15

## 2023-09-27 MED ORDER — PALONOSETRON HCL INJECTION 0.25 MG/5ML
0.2500 mg | Freq: Once | INTRAVENOUS | Status: AC
  Administered 2023-09-27: 0.25 mg via INTRAVENOUS
  Filled 2023-09-27: qty 5

## 2023-09-27 MED ORDER — SODIUM CHLORIDE 0.9 % IV SOLN: INTRAVENOUS | Status: DC

## 2023-09-27 MED ORDER — SODIUM CHLORIDE 0.9% FLUSH
10.0000 mL | Freq: Once | INTRAVENOUS | Status: AC
Start: 2023-09-27 — End: 2023-09-27
  Administered 2023-09-27: 10 mL

## 2023-09-27 MED ORDER — DEXAMETHASONE SODIUM PHOSPHATE 10 MG/ML IJ SOLN
10.0000 mg | Freq: Once | INTRAMUSCULAR | Status: AC
  Administered 2023-09-27: 10 mg via INTRAVENOUS
  Filled 2023-09-27: qty 1

## 2023-09-27 MED ORDER — SODIUM CHLORIDE 0.9 % IV SOLN
45.0000 mg/m2 | Freq: Once | INTRAVENOUS | Status: AC
  Administered 2023-09-27: 84 mg via INTRAVENOUS
  Filled 2023-09-27: qty 14

## 2023-09-27 NOTE — Progress Notes (Signed)
 [facility] [phone]   [phone]  OFFICE PROGRESS NOTE  Stephen [name], FNP [address] [place] [address]  DIAGNOSIS: Stage IIIB (T3, N2b, M0) non-small cell lung cancer, squamous cell carcinoma presented with right upper arm lower lobe lung nodules in addition to mediastinal lymphadenopathy diagnosed in June 2025.    PDL1: 0%   PRIOR THERAPY: None   CURRENT THERAPY: Concurrent chemoradiation with weekly Carboplatin  for AUC 2 and Paclitaxel  45 mg/m2.  First dose on 08/23/2023.  Status post 5 cycles of treatment.  INTERVAL HISTORY: Stephen Meza 89 y.o. male returns to the clinic today for follow-up visit accompanied by his wife and daughter Stephen Meza. Discussed the use of AI scribe software for clinical note transcription with the patient, who gave verbal consent to proceed.  History of Present Illness Stephen Meza is an 89 year old male with stage three B nonsmall cell lung cancer who presents for evaluation before starting cycle number six of chemoradiation. He is accompanied by his wife and youngest daughter, Stephen Meza.  He was diagnosed with stage three B nonsmall cell lung cancer, squamous cell carcinoma, in June 2025 and is currently undergoing concurrent chemoradiation therapy with weekly carboplatin  and paclitaxel . He has completed five cycles and is here for evaluation before starting the sixth cycle.  He feels 'okay' for the week and notes improved breathing. However, he experiences weakness in his legs and describes a persistent sensation under his ribs, which he describes as 'something against the bone.'  He experienced an unusual bowel pattern starting Friday, with frequent urges to defecate, occurring about eight times in a short period, despite not being constipated. He uses two types of stool softeners and takes Miralax  daily. The stool was hard, and he had to get up multiple times during  the night to attempt bowel movements.  His white blood cell count is low.    MEDICAL HISTORY: Past Medical History:  Diagnosis Date   Bladder cancer (HCC) 03/19/2016   DUODENITIS, WITHOUT HEMORRHAGE 07/14/2007   ERECTILE DYSFUNCTION 07/14/2007   Esophageal stricture 03/19/2016   ESOPHAGITIS 07/14/2007   EXTERNAL HEMORRHOIDS 07/14/2007   GERD 07/14/2007   Gout    HYPERTENSION 07/14/2007   PERIPHERAL NEUROPATHY 07/14/2007   Peripheral neuropathy 04/06/2019   PERIPHERAL VASCULAR DISEASE 07/14/2007   PODAGRA 07/14/2007   Prostate cancer (HCC) 02/2012   pt had screening and then biopsy   Prostate cancer (HCC) 04/13/2012   Mild rise in PSA lead to prostate biopsy - 1:11 positive for adenocarcinoma. CT abd/pelvis was negative except for enlarged prostate.     Treatment - hormonal     Retinal tear of right eye 03/19/2016   Stroke (HCC) 05/09/2023   TOBACCO ABUSE, HX OF 02/12/2010   TONSILLECTOMY, HX OF 07/14/2007   TREMOR, ESSENTIAL 07/14/2007   VITAMIN B12 DEFICIENCY 07/14/2007    ALLERGIES:  is allergic to ciprofloxacin hcl, statins, and nitrofuran derivatives.  MEDICATIONS:  Current Outpatient Medications  Medication Sig Dispense Refill   albuterol  (VENTOLIN  HFA) 108 (90 Base) MCG/ACT inhaler Take 1 puff as needed for shortness of breath or wheezing, 8.5 g 2   allopurinol  (ZYLOPRIM ) 100 MG tablet TAKE 1 TABLET BY MOUTH EVERY DAY 90 tablet 4   aspirin  EC 81 MG tablet Take 1 tablet (81 mg total) by mouth daily. Okay to restart on 07/27/2023.  Swallow whole.     benzonatate  (TESSALON ) 100 MG capsule TAKE 1 CAPSULE (  100 MG TOTAL) BY MOUTH EVERY 6 (SIX) HOURS AS NEEDED FOR COUGH 30 capsule 0   carvedilol  (COREG ) 3.125 MG tablet TAKE 1 TABLET BY MOUTH TWICE A DAY WITH A MEAL 180 tablet 1   clopidogrel  (PLAVIX ) 75 MG tablet TAKE 1 TABLET BY MOUTH EVERY DAY 30 tablet 2   Cyanocobalamin  (B-12) 3000 MCG CAPS Take 3,000 mcg by mouth daily.     diclofenac  Sodium (VOLTAREN ) 1 % GEL Apply 4  g topically 4 (four) times daily. 150 g 3   docusate sodium  (COLACE) 100 MG capsule Take 100 mg by mouth 2 (two) times daily.     ezetimibe  (ZETIA ) 10 MG tablet TAKE 1 TABLET BY MOUTH EVERY DAY 90 tablet 1   lidocaine -prilocaine  (EMLA ) cream Apply 1 Application topically as needed. 30 g 0   omeprazole  (PRILOSEC) 40 MG capsule TAKE 1 CAPSULE BY MOUTH EVERY DAY 90 capsule 2   ondansetron  (ZOFRAN -ODT) 4 MG disintegrating tablet Take 1 tablet (4 mg total) by mouth every 8 (eight) hours as needed for nausea or vomiting. 20 tablet 0   polyethylene glycol (MIRALAX ) 17 g packet Take 17 g by mouth daily as needed for moderate constipation or severe constipation (not helped with stool softner). 10 each 0   sucralfate  (CARAFATE ) 1 g tablet Take 1 tablet (1 g total) by mouth 4 (four) times daily. Dissolve each tablet in 15 cc water before use. 120 tablet 2   No current facility-administered medications for this visit.    SURGICAL HISTORY:  Past Surgical History:  Procedure Laterality Date   BRONCHIAL BIOPSY  07/26/2023   Procedure: BRONCHOSCOPY, WITH BIOPSY;  Surgeon: [name], MD;  Location: [facility];  Service: Pulmonary;;   BRONCHIAL BRUSHINGS  07/26/2023   Procedure: BRONCHOSCOPY, WITH BRUSH BIOPSY;  Surgeon: [name], MD;  Location: [facility];  Service: Pulmonary;;   Excision of lipoma     IR IMAGING GUIDED PORT INSERTION  08/16/2023   PROSTATE BIOPSY Bilateral 03/10/2012   surgery to reduce turbinate and straighten deviated septum  2016   TONSILLECTOMY     TRANSCAROTID ARTERY REVASCULARIZATION  Right 05/14/2023   Procedure: TRANSCAROTID ARTERY REVASCULARIZATION (TCAR) UISNG 10mm X 40mm ENROUTE TRANSCAROTID STENT SYSTEM;  Surgeon: Lanis Fonda BRAVO, MD;  Location: [facility] OR;  Service: Vascular;  Laterality: Right;   VIDEO BRONCHOSCOPY  07/26/2023   Procedure: VIDEO BRONCHOSCOPY WITHOUT FLUORO;  Surgeon: [name], MD;  Location: [facility];  Service: Pulmonary;;    REVIEW OF  SYSTEMS:  Constitutional: positive for fatigue Eyes: negative Ears, nose, mouth, throat, and face: negative Respiratory: positive for pleurisy/chest pain Cardiovascular: negative Gastrointestinal: positive for constipation Genitourinary:negative Integument/breast: negative Hematologic/lymphatic: negative Musculoskeletal:positive for arthralgias Neurological: negative Behavioral/Psych: negative Endocrine: negative Allergic/Immunologic: negative   PHYSICAL EXAMINATION: General appearance: alert, cooperative, fatigued, and no distress Head: Normocephalic, without obvious abnormality, atraumatic Neck: no adenopathy, no JVD, supple, symmetrical, trachea midline, and thyroid  not enlarged, symmetric, no tenderness/mass/nodules Lymph nodes: Cervical, supraclavicular, and axillary nodes normal. Resp: clear to auscultation bilaterally Back: symmetric, no curvature. ROM normal. No CVA tenderness. Cardio: regular rate and rhythm, S1, S2 normal, no murmur, click, rub or gallop GI: soft, non-tender; bowel sounds normal; no masses,  no organomegaly Extremities: extremities normal, atraumatic, no cyanosis or edema Neurologic: Alert and oriented X 3, normal strength and tone. Normal symmetric reflexes. Normal coordination and gait  ECOG PERFORMANCE STATUS: 1 - Symptomatic but completely ambulatory  Blood pressure 106/87, pulse (!) 103, temperature 98 F (36.7 C), temperature  source Temporal, resp. rate 16, height 6' (1.829 m), weight 156 lb (70.8 kg), SpO2 100%.  LABORATORY DATA: Lab Results  Component Value Date   WBC 2.3 (L) 09/27/2023   HGB 9.2 (L) 09/27/2023   HCT 28.1 (L) 09/27/2023   MCV 87.5 09/27/2023   PLT 102 (L) 09/27/2023      Chemistry      Component Value Date/Time   NA 135 09/27/2023 0948   K 3.9 09/27/2023 0948   CL 102 09/27/2023 0948   CO2 28 09/27/2023 0948   BUN 24 (H) 09/27/2023 0948   CREATININE 0.61 09/27/2023 0948      Component Value Date/Time   CALCIUM  8.9  09/27/2023 0948   ALKPHOS 61 09/27/2023 0948   AST 19 09/27/2023 0948   ALT 14 09/27/2023 0948   BILITOT 0.4 09/27/2023 0948       RADIOGRAPHIC STUDIES: No results found.   ASSESSMENT AND PLAN: This is a very pleasant 89 years old white male with Stage IIIB (T3, N2b, M0) non-small cell lung cancer, squamous cell carcinoma presented with right upper arm lower lobe lung nodules in addition to mediastinal lymphadenopathy diagnosed in June 2025.    PDL1: 0% He is currently undergoing a course of concurrent chemoradiation with weekly carboplatin  for AUC of 2 and paclitaxel  45 Mg/M2 status post 5 cycles. Assessment and Plan Assessment & Plan Stage IIIB non-small cell lung cancer, squamous cell carcinoma undergoing concurrent chemoradiation Currently undergoing concurrent chemoradiation with weekly carboplatin  and paclitaxel . He has completed five cycles and is being evaluated before starting the sixth cycle. Reports feeling weaker, particularly in his legs, due to cumulative treatment effects. Anticipated improvement in strength post-treatment with a recovery period. - Continue with the sixth cycle of chemoradiation next week if lab results permit - Order chest CT scan after three weeks of recovery post-treatment - Discuss potential immunotherapy based on scan results  Chemotherapy-induced neutropenia White blood cell count is low, likely due to cumulative effects of chemoradiation as he approaches the end of his treatment regimen. - Monitor white blood cell count with next week's lab tests - Make a decision on administering the last treatment based on lab results  Altered bowel habits during chemotherapy Reports frequent small bowel movements, described as the size of a walnut, occurring multiple times in a short period. This is not typical constipation but may be related to dietary factors or treatment effects. He uses stool softeners and Miralax  daily. - Continue current bowel regimen  with stool softeners and Miralax  The patient was advised to call immediately if he has any concerning symptoms in the interval.  The patient voices understanding of current disease status and treatment options and is in agreement with the current care plan.  All questions were answered. The patient knows to call the clinic with any problems, questions or concerns. We can certainly see the patient much sooner if necessary.  The total time spent in the appointment was 30 minutes including review of chart and various tests results, discussions about plan of care and coordination of care plan .  Disclaimer: This note was dictated with voice recognition software. Similar sounding words can inadvertently be transcribed and may not be corrected upon review.

## 2023-09-27 NOTE — Patient Instructions (Signed)
 [facility]  Discharge Instructions: Thank you for choosing [facility] to provide your oncology and hematology care.   If you have a lab appointment with the Cancer Center, please go directly to the Cancer Center and check in at the registration area.   Wear comfortable clothing and clothing appropriate for easy access to any Portacath or PICC line.   We strive to give you quality time with your provider. You may need to reschedule your appointment if you arrive late (15 or more minutes).  Arriving late affects you and other patients whose appointments are after yours.  Also, if you miss three or more appointments without notifying the office, you may be dismissed from the clinic at the provider's discretion.      For prescription refill requests, have your pharmacy contact our office and allow 72 hours for refills to be completed.    Today you received the following chemotherapy and/or immunotherapy agents paclitaxel, carboplatin      To help prevent nausea and vomiting after your treatment, we encourage you to take your nausea medication as directed.  BELOW ARE SYMPTOMS THAT SHOULD BE REPORTED IMMEDIATELY: *FEVER GREATER THAN 100.4 F (38 C) OR HIGHER *CHILLS OR SWEATING *NAUSEA AND VOMITING THAT IS NOT CONTROLLED WITH YOUR NAUSEA MEDICATION *UNUSUAL SHORTNESS OF BREATH *UNUSUAL BRUISING OR BLEEDING *URINARY PROBLEMS (pain or burning when urinating, or frequent urination) *BOWEL PROBLEMS (unusual diarrhea, constipation, pain near the anus) TENDERNESS IN MOUTH AND THROAT WITH OR WITHOUT PRESENCE OF ULCERS (sore throat, sores in mouth, or a toothache) UNUSUAL RASH, SWELLING OR PAIN  UNUSUAL VAGINAL DISCHARGE OR ITCHING   Items with * indicate a potential emergency and should be followed up as soon as possible or go to the Emergency Department if any problems should occur.  Please show the CHEMOTHERAPY ALERT CARD or  IMMUNOTHERAPY ALERT CARD at check-in to the Emergency Department and triage nurse.  Should you have questions after your visit or need to cancel or reschedule your appointment, please contact [facility]  Dept: [phone]  and follow the prompts.  Office hours are 8:00 a.m. to 4:30 p.m. Monday - Friday. Please note that voicemails left after 4:00 p.m. may not be returned until the following business day.  We are closed weekends and major holidays. You have access to a nurse at all times for urgent questions. Please call the main number to the clinic Dept: [phone] and follow the prompts.   For any non-urgent questions, you may also contact your provider using MyChart. We now offer e-Visits for anyone 44 and older to request care online for non-urgent symptoms. For details visit [email or web address].   Also download the MyChart app! Go to the app store, search "MyChart", open the app, select [place], and log in with your MyChart username and password.

## 2023-09-28 ENCOUNTER — Encounter: Admitting: Dietician

## 2023-09-28 ENCOUNTER — Telehealth: Payer: Self-pay | Admitting: Internal Medicine

## 2023-09-28 ENCOUNTER — Other Ambulatory Visit: Payer: Self-pay

## 2023-09-28 ENCOUNTER — Ambulatory Visit
Admission: RE | Admit: 2023-09-28 | Discharge: 2023-09-28 | Disposition: A | Discharge status: Home / Self Care | Source: Ambulatory Visit | Attending: Radiation Oncology

## 2023-09-28 DIAGNOSIS — C3431 Malignant neoplasm of lower lobe, right bronchus or lung: Secondary | ICD-10-CM | POA: Diagnosis not present

## 2023-09-28 DIAGNOSIS — Z452 Encounter for adjustment and management of vascular access device: Secondary | ICD-10-CM | POA: Diagnosis not present

## 2023-09-28 DIAGNOSIS — Z8551 Personal history of malignant neoplasm of bladder: Secondary | ICD-10-CM | POA: Diagnosis not present

## 2023-09-28 DIAGNOSIS — R7989 Other specified abnormal findings of blood chemistry: Secondary | ICD-10-CM | POA: Diagnosis not present

## 2023-09-28 DIAGNOSIS — Z5111 Encounter for antineoplastic chemotherapy: Secondary | ICD-10-CM | POA: Diagnosis not present

## 2023-09-28 DIAGNOSIS — Z510 Encounter for antineoplastic radiation therapy: Secondary | ICD-10-CM | POA: Diagnosis not present

## 2023-09-28 DIAGNOSIS — D701 Agranulocytosis secondary to cancer chemotherapy: Secondary | ICD-10-CM | POA: Diagnosis not present

## 2023-09-28 DIAGNOSIS — K5900 Constipation, unspecified: Secondary | ICD-10-CM | POA: Diagnosis not present

## 2023-09-28 DIAGNOSIS — J449 Chronic obstructive pulmonary disease, unspecified: Secondary | ICD-10-CM | POA: Diagnosis not present

## 2023-09-28 DIAGNOSIS — Z87891 Personal history of nicotine dependence: Secondary | ICD-10-CM | POA: Diagnosis not present

## 2023-09-28 LAB — RAD ONC ARIA SESSION SUMMARY
Course Elapsed Days: 36
Plan Fractions Treated to Date: 26
Plan Prescribed Dose Per Fraction: 2 Gy
Plan Total Fractions Prescribed: 30
Plan Total Prescribed Dose: 60 Gy
Reference Point Dosage Given to Date: 52 Gy
Reference Point Session Dosage Given: 2 Gy
Session Number: 26

## 2023-09-28 NOTE — Telephone Encounter (Signed)
 Left the patient a voicemail with the scheduled appointment details.

## 2023-09-29 ENCOUNTER — Other Ambulatory Visit: Payer: Self-pay

## 2023-09-29 ENCOUNTER — Ambulatory Visit
Admission: RE | Admit: 2023-09-29 | Discharge: 2023-09-29 | Disposition: A | Discharge status: Home / Self Care | Source: Ambulatory Visit | Attending: Radiation Oncology | Admitting: Radiation Oncology

## 2023-09-29 ENCOUNTER — Ambulatory Visit: Admitting: Family Medicine

## 2023-09-29 DIAGNOSIS — K5900 Constipation, unspecified: Secondary | ICD-10-CM | POA: Diagnosis not present

## 2023-09-29 DIAGNOSIS — Z8551 Personal history of malignant neoplasm of bladder: Secondary | ICD-10-CM | POA: Diagnosis not present

## 2023-09-29 DIAGNOSIS — Z5111 Encounter for antineoplastic chemotherapy: Secondary | ICD-10-CM | POA: Diagnosis not present

## 2023-09-29 DIAGNOSIS — C3431 Malignant neoplasm of lower lobe, right bronchus or lung: Secondary | ICD-10-CM | POA: Diagnosis not present

## 2023-09-29 DIAGNOSIS — Z452 Encounter for adjustment and management of vascular access device: Secondary | ICD-10-CM | POA: Diagnosis not present

## 2023-09-29 DIAGNOSIS — J449 Chronic obstructive pulmonary disease, unspecified: Secondary | ICD-10-CM | POA: Diagnosis not present

## 2023-09-29 DIAGNOSIS — D701 Agranulocytosis secondary to cancer chemotherapy: Secondary | ICD-10-CM | POA: Diagnosis not present

## 2023-09-29 DIAGNOSIS — Z510 Encounter for antineoplastic radiation therapy: Secondary | ICD-10-CM | POA: Diagnosis not present

## 2023-09-29 DIAGNOSIS — R7989 Other specified abnormal findings of blood chemistry: Secondary | ICD-10-CM | POA: Diagnosis not present

## 2023-09-29 DIAGNOSIS — Z87891 Personal history of nicotine dependence: Secondary | ICD-10-CM | POA: Diagnosis not present

## 2023-09-29 LAB — RAD ONC ARIA SESSION SUMMARY
Course Elapsed Days: 37
Plan Fractions Treated to Date: 27
Plan Prescribed Dose Per Fraction: 2 Gy
Plan Total Fractions Prescribed: 30
Plan Total Prescribed Dose: 60 Gy
Reference Point Dosage Given to Date: 54 Gy
Reference Point Session Dosage Given: 2 Gy
Session Number: 27

## 2023-09-30 ENCOUNTER — Other Ambulatory Visit: Payer: Self-pay

## 2023-09-30 ENCOUNTER — Ambulatory Visit
Admission: RE | Admit: 2023-09-30 | Discharge: 2023-09-30 | Disposition: A | Discharge status: Home / Self Care | Source: Ambulatory Visit | Attending: Radiation Oncology | Admitting: Radiation Oncology

## 2023-09-30 DIAGNOSIS — R7989 Other specified abnormal findings of blood chemistry: Secondary | ICD-10-CM | POA: Diagnosis not present

## 2023-09-30 DIAGNOSIS — Z87891 Personal history of nicotine dependence: Secondary | ICD-10-CM | POA: Diagnosis not present

## 2023-09-30 DIAGNOSIS — Z5111 Encounter for antineoplastic chemotherapy: Secondary | ICD-10-CM | POA: Diagnosis not present

## 2023-09-30 DIAGNOSIS — J449 Chronic obstructive pulmonary disease, unspecified: Secondary | ICD-10-CM | POA: Diagnosis not present

## 2023-09-30 DIAGNOSIS — Z510 Encounter for antineoplastic radiation therapy: Secondary | ICD-10-CM | POA: Diagnosis not present

## 2023-09-30 DIAGNOSIS — C3431 Malignant neoplasm of lower lobe, right bronchus or lung: Secondary | ICD-10-CM | POA: Diagnosis not present

## 2023-09-30 DIAGNOSIS — Z452 Encounter for adjustment and management of vascular access device: Secondary | ICD-10-CM | POA: Diagnosis not present

## 2023-09-30 DIAGNOSIS — K5900 Constipation, unspecified: Secondary | ICD-10-CM | POA: Diagnosis not present

## 2023-09-30 DIAGNOSIS — Z8551 Personal history of malignant neoplasm of bladder: Secondary | ICD-10-CM | POA: Diagnosis not present

## 2023-09-30 DIAGNOSIS — D701 Agranulocytosis secondary to cancer chemotherapy: Secondary | ICD-10-CM | POA: Diagnosis not present

## 2023-09-30 LAB — RAD ONC ARIA SESSION SUMMARY
Course Elapsed Days: 38
Plan Fractions Treated to Date: 28
Plan Prescribed Dose Per Fraction: 2 Gy
Plan Total Fractions Prescribed: 30
Plan Total Prescribed Dose: 60 Gy
Reference Point Dosage Given to Date: 56 Gy
Reference Point Session Dosage Given: 2 Gy
Session Number: 28

## 2023-10-01 ENCOUNTER — Ambulatory Visit
Admission: RE | Admit: 2023-10-01 | Discharge: 2023-10-01 | Disposition: A | Discharge status: Home / Self Care | Source: Ambulatory Visit | Attending: Radiation Oncology | Admitting: Radiation Oncology

## 2023-10-01 ENCOUNTER — Other Ambulatory Visit: Payer: Self-pay

## 2023-10-01 DIAGNOSIS — Z8551 Personal history of malignant neoplasm of bladder: Secondary | ICD-10-CM | POA: Diagnosis not present

## 2023-10-01 DIAGNOSIS — Z5111 Encounter for antineoplastic chemotherapy: Secondary | ICD-10-CM | POA: Diagnosis not present

## 2023-10-01 DIAGNOSIS — Z87891 Personal history of nicotine dependence: Secondary | ICD-10-CM | POA: Diagnosis not present

## 2023-10-01 DIAGNOSIS — K5900 Constipation, unspecified: Secondary | ICD-10-CM | POA: Diagnosis not present

## 2023-10-01 DIAGNOSIS — C3431 Malignant neoplasm of lower lobe, right bronchus or lung: Secondary | ICD-10-CM

## 2023-10-01 DIAGNOSIS — R7989 Other specified abnormal findings of blood chemistry: Secondary | ICD-10-CM | POA: Diagnosis not present

## 2023-10-01 DIAGNOSIS — J449 Chronic obstructive pulmonary disease, unspecified: Secondary | ICD-10-CM | POA: Diagnosis not present

## 2023-10-01 DIAGNOSIS — Z510 Encounter for antineoplastic radiation therapy: Secondary | ICD-10-CM | POA: Diagnosis not present

## 2023-10-01 DIAGNOSIS — D701 Agranulocytosis secondary to cancer chemotherapy: Secondary | ICD-10-CM | POA: Diagnosis not present

## 2023-10-01 DIAGNOSIS — Z452 Encounter for adjustment and management of vascular access device: Secondary | ICD-10-CM | POA: Diagnosis not present

## 2023-10-01 LAB — RAD ONC ARIA SESSION SUMMARY
Course Elapsed Days: 39
Plan Fractions Treated to Date: 29
Plan Prescribed Dose Per Fraction: 2 Gy
Plan Total Fractions Prescribed: 30
Plan Total Prescribed Dose: 60 Gy
Reference Point Dosage Given to Date: 58 Gy
Reference Point Session Dosage Given: 2 Gy
Session Number: 29

## 2023-10-01 MED ORDER — SONAFINE EX EMUL
1.0000 | Freq: Two times a day (BID) | CUTANEOUS | Status: DC
  Administered 2023-10-01: 1 via TOPICAL

## 2023-10-04 ENCOUNTER — Other Ambulatory Visit: Payer: Self-pay | Admitting: Medical Oncology

## 2023-10-04 ENCOUNTER — Inpatient Hospital Stay

## 2023-10-04 ENCOUNTER — Other Ambulatory Visit: Payer: Self-pay

## 2023-10-04 ENCOUNTER — Encounter: Payer: Self-pay | Admitting: Internal Medicine

## 2023-10-04 ENCOUNTER — Telehealth: Payer: Self-pay | Admitting: Medical Oncology

## 2023-10-04 ENCOUNTER — Other Ambulatory Visit: Payer: Self-pay | Admitting: Radiation Oncology

## 2023-10-04 ENCOUNTER — Telehealth: Payer: Self-pay

## 2023-10-04 ENCOUNTER — Ambulatory Visit
Admission: RE | Admit: 2023-10-04 | Discharge: 2023-10-04 | Disposition: A | Discharge status: Home / Self Care | Source: Ambulatory Visit | Attending: Radiation Oncology | Admitting: Radiation Oncology

## 2023-10-04 DIAGNOSIS — K5900 Constipation, unspecified: Secondary | ICD-10-CM | POA: Diagnosis not present

## 2023-10-04 DIAGNOSIS — Z8551 Personal history of malignant neoplasm of bladder: Secondary | ICD-10-CM | POA: Diagnosis not present

## 2023-10-04 DIAGNOSIS — R748 Abnormal levels of other serum enzymes: Secondary | ICD-10-CM

## 2023-10-04 DIAGNOSIS — Z5111 Encounter for antineoplastic chemotherapy: Secondary | ICD-10-CM | POA: Diagnosis not present

## 2023-10-04 DIAGNOSIS — Z510 Encounter for antineoplastic radiation therapy: Secondary | ICD-10-CM | POA: Diagnosis not present

## 2023-10-04 DIAGNOSIS — D701 Agranulocytosis secondary to cancer chemotherapy: Secondary | ICD-10-CM | POA: Diagnosis not present

## 2023-10-04 DIAGNOSIS — Z95828 Presence of other vascular implants and grafts: Secondary | ICD-10-CM

## 2023-10-04 DIAGNOSIS — Z452 Encounter for adjustment and management of vascular access device: Secondary | ICD-10-CM | POA: Diagnosis not present

## 2023-10-04 DIAGNOSIS — C3431 Malignant neoplasm of lower lobe, right bronchus or lung: Secondary | ICD-10-CM | POA: Diagnosis not present

## 2023-10-04 DIAGNOSIS — R7989 Other specified abnormal findings of blood chemistry: Secondary | ICD-10-CM | POA: Diagnosis not present

## 2023-10-04 DIAGNOSIS — J449 Chronic obstructive pulmonary disease, unspecified: Secondary | ICD-10-CM | POA: Diagnosis not present

## 2023-10-04 DIAGNOSIS — Z87891 Personal history of nicotine dependence: Secondary | ICD-10-CM | POA: Diagnosis not present

## 2023-10-04 LAB — CBC WITH DIFFERENTIAL (CANCER CENTER ONLY)
Abs Immature Granulocytes: 0.01 K/uL (ref 0.00–0.07)
Basophils Absolute: 0 K/uL (ref 0.0–0.1)
Basophils Relative: 1 %
Eosinophils Absolute: 0 K/uL (ref 0.0–0.5)
Eosinophils Relative: 0 %
HCT: 27.3 % — ABNORMAL LOW (ref 39.0–52.0)
Hemoglobin: 9 g/dL — ABNORMAL LOW (ref 13.0–17.0)
Immature Granulocytes: 1 %
Lymphocytes Relative: 7 %
Lymphs Abs: 0.1 K/uL — ABNORMAL LOW (ref 0.7–4.0)
MCH: 28.8 pg (ref 26.0–34.0)
MCHC: 33 g/dL (ref 30.0–36.0)
MCV: 87.2 fL (ref 80.0–100.0)
Monocytes Absolute: 0.2 K/uL (ref 0.1–1.0)
Monocytes Relative: 12 %
Neutro Abs: 1.5 K/uL — ABNORMAL LOW (ref 1.7–7.7)
Neutrophils Relative %: 79 %
Platelet Count: 112 K/uL — ABNORMAL LOW (ref 150–400)
RBC: 3.13 MIL/uL — ABNORMAL LOW (ref 4.22–5.81)
RDW: 19.7 % — ABNORMAL HIGH (ref 11.5–15.5)
WBC Count: 1.8 K/uL — ABNORMAL LOW (ref 4.0–10.5)
nRBC: 0 % (ref 0.0–0.2)

## 2023-10-04 LAB — CMP (CANCER CENTER ONLY)
ALT: 248 U/L — ABNORMAL HIGH (ref 0–44)
AST: 261 U/L (ref 15–41)
Albumin: 4.1 g/dL (ref 3.5–5.0)
Alkaline Phosphatase: 118 U/L (ref 38–126)
Anion gap: 7 (ref 5–15)
BUN: 26 mg/dL — ABNORMAL HIGH (ref 8–23)
CO2: 29 mmol/L (ref 22–32)
Calcium: 9.6 mg/dL (ref 8.9–10.3)
Chloride: 99 mmol/L (ref 98–111)
Creatinine: 0.63 mg/dL (ref 0.61–1.24)
GFR, Estimated: >60 mL/min (ref >60)
Glucose, Bld: 130 mg/dL — ABNORMAL HIGH (ref 70–99)
Potassium: 3.8 mmol/L (ref 3.5–5.1)
Sodium: 135 mmol/L (ref 135–145)
Total Bilirubin: 1.5 mg/dL — ABNORMAL HIGH (ref 0.0–1.2)
Total Protein: 7.3 g/dL (ref 6.5–8.1)

## 2023-10-04 LAB — RAD ONC ARIA SESSION SUMMARY
Course Elapsed Days: 42
Plan Fractions Treated to Date: 30
Plan Prescribed Dose Per Fraction: 2 Gy
Plan Total Fractions Prescribed: 30
Plan Total Prescribed Dose: 60 Gy
Reference Point Dosage Given to Date: 60 Gy
Reference Point Session Dosage Given: 2 Gy
Session Number: 30

## 2023-10-04 MED ORDER — METHYLPREDNISOLONE 4 MG PO TABS
ORAL_TABLET | ORAL | 0 refills | Status: DC
Start: 2023-10-04 — End: 2023-11-08

## 2023-10-04 MED ORDER — SODIUM CHLORIDE 0.9% FLUSH
10.0000 mL | Freq: Once | INTRAVENOUS | Status: AC
  Administered 2023-10-04 (×2): 10 mL

## 2023-10-04 NOTE — Telephone Encounter (Signed)
 Instructed [place] that [employer]. PA-C, in Radiation Therapy  reported that Stephen Meza may have inflammation of the cartilage between the ribs, which can cause pain and swelling. This is possibly related to the location of his disease, which is posterior near the chest wall. [place] was advised to continue using the prescribed cream. She was also informed that skin changes may take up to a month to fully resolve.

## 2023-10-04 NOTE — Progress Notes (Signed)
 No treatment today due to the patient's elevated LFTs.

## 2023-10-04 NOTE — Telephone Encounter (Signed)
 CRITICAL VALUE STICKER  CRITICAL VALUE: AST [id number]  RECEIVER (on-site recipient of call):Tayen Narang, LPN  DATE & TIME NOTIFIED: 10/04/23  03:08  MESSENGER (representative from lab): Powell  MD NOTIFIED: Sherrod Sherrod MD  TIME OF NOTIFICATION: 03:09  RESPONSE:

## 2023-10-04 NOTE — Progress Notes (Signed)
 Nutrition Assessment   Reason for Assessment:   Referral from Dr Gatha   ASSESSMENT:  89 year old male with lung cancer, stage IIIB.  Past medical history of COPB, bladder cancer, esophageal stricture, GERD, gout, HTN, PVD, prostate cancer.  Patient receiving concurrent chemotherapy and radiation (carbo/taxol )  Met with patient and wife during infusion.  Patient experiencing pain and may have inflammation of cartilage between ribs.  Wife reports pain was intense last night.  Patient vomited times 4 and thinks it maybe related to the pain as patient has had 6 chemo treatments without nausea.  Reports that appetite is reduced and he has lost weight since starting treatment.  Dentures do not fit well and having to blend meats to make easier to chew.  Drinking premier protein shake TID.  Also making smoothie with vegetables, fruits, avocado and almond milk, and protein powder.  No bowel movement for about 4 days but had one last night.  Eating mostly soups.  Wife gave patient nausea medication last night and then 8 hours later.     Medications: zofran , colace, miralax , B 12, carafate , medrol  added today   Labs: reviewed   Anthropometrics:   Height: 72 inches Weight: 156 lb on 8/4 160 lb on 07/26/23 2% weight loss in the last 2 months BMI: 20   Estimated Energy Needs  Kcals: 1775-2100 Protein: 88-105 g Fluid: 1775-2100 ml   NUTRITION DIAGNOSIS: Inadequate oral intake related to cancer and related treatment side effects as evidenced by pain (?radiation), constipation, nausea.      INTERVENTION:  Recommend switching to ensure complete vs premier protein (350 calories, 30 g protein) for added calories Recommend whole milk vs almond milk for added calories and protein in smoothie.  Continue protein powder in smoothie Continue bowel regimen.  Reviewed foods to choose with nausea.  Handout provided If not nauseated would encourage high calorie, high protein foods.  Discussed with  wife.  Contact information provided   MONITORING, EVALUATION, GOAL: weight trends, intake   Next Visit: no follow-up RD available if needed  Jelitza Manninen B. Dasie SOLON, CSO, LDN Registered Dietitian [phone]

## 2023-10-04 NOTE — Telephone Encounter (Signed)
 Patient reports a new or worsening issue that began yesterday. He is experiencing pain under the right rib, an area where he has previously had discomfort, but the pain has significantly worsened since yesterday.  His wife reports feeling a raised, soft area approximately the size of a 50-cent piece in the same region, which is very tender to touch. He has not tried any analgesic.   Last night, the patient vomited four times after 9 PM and was unable to sleep. As of today, he continues to have right-sided rib pain, but there has been no further vomiting.  Additionally, the patient endorses a rash on his right side that began during the fourth week of radiation therapy. He is currently using a topical cream for it.  He is presenting today for radiation and infusion treatment.  I told pt /wife to let XRT know about this raised area as wife indicated the pain location may is in his treatment field.

## 2023-10-05 ENCOUNTER — Telehealth: Payer: Self-pay | Admitting: Medical Oncology

## 2023-10-05 ENCOUNTER — Ambulatory Visit
Admission: RE | Admit: 2023-10-05 | Discharge: 2023-10-05 | Disposition: A | Discharge status: Home / Self Care | Source: Ambulatory Visit | Attending: Radiation Oncology | Admitting: Radiation Oncology

## 2023-10-05 ENCOUNTER — Other Ambulatory Visit: Payer: Self-pay

## 2023-10-05 ENCOUNTER — Ambulatory Visit (HOSPITAL_COMMUNITY)
Admission: RE | Admit: 2023-10-05 | Discharge: 2023-10-05 | Disposition: A | Discharge status: Home / Self Care | Source: Ambulatory Visit | Attending: Internal Medicine | Admitting: Internal Medicine

## 2023-10-05 DIAGNOSIS — Z510 Encounter for antineoplastic radiation therapy: Secondary | ICD-10-CM | POA: Diagnosis not present

## 2023-10-05 DIAGNOSIS — C3431 Malignant neoplasm of lower lobe, right bronchus or lung: Secondary | ICD-10-CM | POA: Diagnosis not present

## 2023-10-05 DIAGNOSIS — Z5111 Encounter for antineoplastic chemotherapy: Secondary | ICD-10-CM | POA: Diagnosis not present

## 2023-10-05 DIAGNOSIS — R109 Unspecified abdominal pain: Secondary | ICD-10-CM | POA: Diagnosis not present

## 2023-10-05 DIAGNOSIS — K5900 Constipation, unspecified: Secondary | ICD-10-CM | POA: Diagnosis not present

## 2023-10-05 DIAGNOSIS — J449 Chronic obstructive pulmonary disease, unspecified: Secondary | ICD-10-CM | POA: Diagnosis not present

## 2023-10-05 DIAGNOSIS — D701 Agranulocytosis secondary to cancer chemotherapy: Secondary | ICD-10-CM | POA: Diagnosis not present

## 2023-10-05 DIAGNOSIS — Z452 Encounter for adjustment and management of vascular access device: Secondary | ICD-10-CM | POA: Diagnosis not present

## 2023-10-05 DIAGNOSIS — I7143 Infrarenal abdominal aortic aneurysm, without rupture: Secondary | ICD-10-CM | POA: Diagnosis not present

## 2023-10-05 DIAGNOSIS — Z8551 Personal history of malignant neoplasm of bladder: Secondary | ICD-10-CM | POA: Diagnosis not present

## 2023-10-05 DIAGNOSIS — Z87891 Personal history of nicotine dependence: Secondary | ICD-10-CM | POA: Diagnosis not present

## 2023-10-05 DIAGNOSIS — R7989 Other specified abnormal findings of blood chemistry: Secondary | ICD-10-CM | POA: Diagnosis not present

## 2023-10-05 DIAGNOSIS — R748 Abnormal levels of other serum enzymes: Secondary | ICD-10-CM | POA: Diagnosis not present

## 2023-10-05 LAB — RAD ONC ARIA SESSION SUMMARY
Course Elapsed Days: 43
Plan Fractions Treated to Date: 1
Plan Prescribed Dose Per Fraction: 2 Gy
Plan Total Fractions Prescribed: 3
Plan Total Prescribed Dose: 6 Gy
Reference Point Dosage Given to Date: 2 Gy
Reference Point Session Dosage Given: 2 Gy
Session Number: 31

## 2023-10-05 NOTE — Telephone Encounter (Addendum)
 Pt informed to start medrol  dose pak today .  Pt is better today with less pain and swelling has gone down. He had a good BM yesterday and expelled some gas.   CT scan results messaged to Corean Ku.

## 2023-10-06 ENCOUNTER — Other Ambulatory Visit: Payer: Self-pay

## 2023-10-06 ENCOUNTER — Ambulatory Visit

## 2023-10-06 ENCOUNTER — Emergency Department (HOSPITAL_COMMUNITY)

## 2023-10-06 ENCOUNTER — Emergency Department (HOSPITAL_COMMUNITY)
Admission: EM | Admit: 2023-10-06 | Discharge: 2023-10-06 | Disposition: A | Discharge status: Home / Self Care | Source: Ambulatory Visit | Attending: Emergency Medicine | Admitting: Emergency Medicine

## 2023-10-06 DIAGNOSIS — R918 Other nonspecific abnormal finding of lung field: Secondary | ICD-10-CM | POA: Diagnosis not present

## 2023-10-06 DIAGNOSIS — Z7982 Long term (current) use of aspirin: Secondary | ICD-10-CM | POA: Insufficient documentation

## 2023-10-06 DIAGNOSIS — R55 Syncope and collapse: Secondary | ICD-10-CM

## 2023-10-06 DIAGNOSIS — R531 Weakness: Secondary | ICD-10-CM | POA: Diagnosis not present

## 2023-10-06 DIAGNOSIS — C3490 Malignant neoplasm of unspecified part of unspecified bronchus or lung: Secondary | ICD-10-CM | POA: Diagnosis not present

## 2023-10-06 DIAGNOSIS — R0989 Other specified symptoms and signs involving the circulatory and respiratory systems: Secondary | ICD-10-CM | POA: Diagnosis not present

## 2023-10-06 DIAGNOSIS — Z8546 Personal history of malignant neoplasm of prostate: Secondary | ICD-10-CM | POA: Diagnosis not present

## 2023-10-06 DIAGNOSIS — Z8551 Personal history of malignant neoplasm of bladder: Secondary | ICD-10-CM | POA: Diagnosis not present

## 2023-10-06 DIAGNOSIS — J984 Other disorders of lung: Secondary | ICD-10-CM | POA: Diagnosis not present

## 2023-10-06 DIAGNOSIS — Z87891 Personal history of nicotine dependence: Secondary | ICD-10-CM | POA: Diagnosis not present

## 2023-10-06 DIAGNOSIS — C3411 Malignant neoplasm of upper lobe, right bronchus or lung: Secondary | ICD-10-CM

## 2023-10-06 LAB — CBC WITH DIFFERENTIAL/PLATELET
Abs Immature Granulocytes: 0 K/uL (ref 0.00–0.07)
Basophils Absolute: 0 K/uL (ref 0.0–0.1)
Basophils Relative: 1 %
Eosinophils Absolute: 0 K/uL (ref 0.0–0.5)
Eosinophils Relative: 0 %
HCT: 27.2 % — ABNORMAL LOW (ref 39.0–52.0)
Hemoglobin: 8.5 g/dL — ABNORMAL LOW (ref 13.0–17.0)
Immature Granulocytes: 0 %
Lymphocytes Relative: 6 %
Lymphs Abs: 0.1 K/uL — ABNORMAL LOW (ref 0.7–4.0)
MCH: 29.1 pg (ref 26.0–34.0)
MCHC: 31.3 g/dL (ref 30.0–36.0)
MCV: 93.2 fL (ref 80.0–100.0)
Monocytes Absolute: 0.3 K/uL (ref 0.1–1.0)
Monocytes Relative: 20 %
Neutro Abs: 1.2 K/uL — ABNORMAL LOW (ref 1.7–7.7)
Neutrophils Relative %: 73 %
Platelets: 126 K/uL — ABNORMAL LOW (ref 150–400)
RBC: 2.92 MIL/uL — ABNORMAL LOW (ref 4.22–5.81)
RDW: 20.9 % — ABNORMAL HIGH (ref 11.5–15.5)
WBC: 1.6 K/uL — ABNORMAL LOW (ref 4.0–10.5)
nRBC: 0 % (ref 0.0–0.2)

## 2023-10-06 LAB — BASIC METABOLIC PANEL WITH GFR
Anion gap: 11 (ref 5–15)
BUN: 29 mg/dL — ABNORMAL HIGH (ref 8–23)
CO2: 22 mmol/L (ref 22–32)
Calcium: 8.6 mg/dL — ABNORMAL LOW (ref 8.9–10.3)
Chloride: 99 mmol/L (ref 98–111)
Creatinine, Ser: 0.84 mg/dL (ref 0.61–1.24)
GFR, Estimated: >60 mL/min (ref >60)
Glucose, Bld: 129 mg/dL — ABNORMAL HIGH (ref 70–99)
Potassium: 3.6 mmol/L (ref 3.5–5.1)
Sodium: 132 mmol/L — ABNORMAL LOW (ref 135–145)

## 2023-10-06 LAB — HEPATIC FUNCTION PANEL
ALT: 93 U/L — ABNORMAL HIGH (ref 0–44)
AST: 45 U/L — ABNORMAL HIGH (ref 15–41)
Albumin: 3.3 g/dL — ABNORMAL LOW (ref 3.5–5.0)
Alkaline Phosphatase: 81 U/L (ref 38–126)
Bilirubin, Direct: <0.1 mg/dL (ref 0.0–0.2)
Total Bilirubin: 1 mg/dL (ref 0.0–1.2)
Total Protein: 6.6 g/dL (ref 6.5–8.1)

## 2023-10-06 LAB — LIPASE, BLOOD: Lipase: 34 U/L (ref 11–51)

## 2023-10-06 LAB — CBG MONITORING, ED: Glucose-Capillary: 108 mg/dL — ABNORMAL HIGH (ref 70–99)

## 2023-10-06 MED ORDER — HEPARIN SOD (PORK) LOCK FLUSH 100 UNIT/ML IV SOLN
500.0000 [IU] | Freq: Once | INTRAVENOUS | Status: AC
  Administered 2023-10-06 (×2): 500 [IU]
  Filled 2023-10-06: qty 5

## 2023-10-06 MED ORDER — SODIUM CHLORIDE 0.9 % IV SOLN: INTRAVENOUS | Status: DC

## 2023-10-06 MED ORDER — SODIUM CHLORIDE 0.9 % IV BOLUS
500.0000 mL | Freq: Once | INTRAVENOUS | Status: AC
  Administered 2023-10-06 (×2): 500 mL via INTRAVENOUS

## 2023-10-06 NOTE — ED Notes (Signed)
 ED Provider at bedside.

## 2023-10-06 NOTE — Discharge Instructions (Signed)
 Keep your follow-up with radiation oncology as scheduled for tomorrow.  Give the cancer center a call to let them know that you were seen in the emergency department.  Liver function tests look better.  Return for any new or worse symptoms.

## 2023-10-06 NOTE — ED Triage Notes (Signed)
 Pt brought from [facility] for syncopal episode. Pt has been dizzy and has bilateral leg weakness. Pt was assisted to wheelchair, no head injury.

## 2023-10-06 NOTE — ED Notes (Signed)
 Pt up to bedside commode. Call bell within reach, spouse at bedside

## 2023-10-06 NOTE — Telephone Encounter (Signed)
 Appt 8/20 with [name]

## 2023-10-06 NOTE — ED Provider Notes (Addendum)
 [place] EMERGENCY DEPARTMENT AT [facility] Provider Note   CSN: [id number] Arrival date & time: 10/06/23  1522     Patient presents with: Loss of Consciousness   Stephen Meza is a 89 y.o. male.   Patient was on his way in for radiation treatment today.  Then while in the wheelchair had about a 1 minute episode of syncope as they were coming through the door to the cancer center.  Patient now back to baseline.  But has been suffering from increased generalized weakness prickly in his legs and worsening neuropathy which he was told by his oncologist Dr. Gatha that this would be made worse by the chemo.  Patient right now without any new complaints.  Patient is currently receiving chemotherapy and radiation treatments.  Patient has felt for a while that his legs would give out.  Last seen in the office by Dr. Gatha on August 4.  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  Current therapy is chemoradiation with weekly carboplatin  and paclitaxel .  First dose was June 30.  Patient denies any headache any new loss of movement or numbness.  He did have a stroke back in the spring time.  Does have a stent in his left carotid artery.  Past medical history significant for esophagitis prostate cancer stroke in March 2025 bladder cancer in 2018 patient is a former smoker.       Prior to Admission medications   Medication Sig Start Date End Date Taking? Authorizing Provider  albuterol  (VENTOLIN  HFA) 108 (90 Base) MCG/ACT inhaler Take 1 puff as needed for shortness of breath or wheezing, 08/03/23   Ruthell Lauraine FALCON, NP  allopurinol  (ZYLOPRIM ) 100 MG tablet TAKE 1 TABLET BY MOUTH EVERY DAY 07/12/23   Harden Jerona GAILS, MD  aspirin  EC 81 MG tablet Take 1 tablet (81 mg total) by mouth daily. Okay to restart on 07/27/2023.   Swallow whole. 07/26/23   [name], MD  benzonatate  (TESSALON ) 100 MG capsule TAKE 1 CAPSULE (100 MG TOTAL) BY MOUTH EVERY 6 (SIX) HOURS AS NEEDED FOR COUGH 09/20/23 09/19/24  Ruthell Lauraine FALCON, NP  carvedilol  (COREG ) 3.125 MG tablet TAKE 1 TABLET BY MOUTH TWICE A DAY WITH A MEAL 07/28/23   [name], FNP  clopidogrel  (PLAVIX ) 75 MG tablet TAKE 1 TABLET BY MOUTH EVERY DAY 08/23/23   [name], FNP  Cyanocobalamin  (B-12) 3000 MCG CAPS Take 3,000 mcg by mouth daily.    [provider]  diclofenac  Sodium (VOLTAREN ) 1 % GEL Apply 4 g topically 4 (four) times daily. 06/09/23   [name], FNP  docusate sodium  (COLACE) 100 MG capsule Take 100 mg by mouth 2 (two) times daily. 07/12/23   [provider]  ezetimibe  (ZETIA ) 10 MG tablet TAKE 1 TABLET BY MOUTH EVERY DAY 07/02/23   [name], FNP  lidocaine -prilocaine  (EMLA ) cream Apply 1 Application topically as needed. 08/17/23   Sherrod Sherrod, MD  methylPREDNISolone  (MEDROL ) 4 MG tablet Take 6 tabs po day 1, 5 tabs po day 2, 4 tabs po day 3, 3 tabs po day 4, 2 tabs po day 5, 1 tab po day 6 then stop 10/04/23   Lanell Donald Stagger, PA-C  omeprazole  (PRILOSEC) 40 MG capsule TAKE 1 CAPSULE BY MOUTH EVERY DAY 03/31/23   Norleen Lynwood ORN, MD  ondansetron  (  ZOFRAN -ODT) 4 MG disintegrating tablet Take 1 tablet (4 mg total) by mouth every 8 (eight) hours as needed for nausea or vomiting. 08/17/23   Sherrod Sherrod, MD  polyethylene glycol (MIRALAX ) 17 g packet Take 17 g by mouth daily as needed for moderate constipation or severe constipation (not helped with stool softner). 05/15/23   Pokhrel, Vernal, MD  sucralfate  (CARAFATE ) 1 g tablet Take 1 tablet (1 g total) by mouth 4 (four) times daily. Dissolve each tablet in 15 cc water before use. 09/10/23   Dewey Rush, MD  tamsulosin  (FLOMAX ) 0.4 MG CAPS capsule Take 0.4 mg by mouth at bedtime. 09/02/23   [provider]    Allergies: Ciprofloxacin hcl,  Statins, and Nitrofuran derivatives    Review of Systems  Constitutional:  Positive for fatigue. Negative for chills and fever.  HENT:  Negative for ear pain and sore throat.   Eyes:  Negative for pain and visual disturbance.  Respiratory:  Negative for cough and shortness of breath.   Cardiovascular:  Negative for chest pain and palpitations.  Gastrointestinal:  Negative for abdominal pain and vomiting.  Genitourinary:  Negative for dysuria and hematuria.  Musculoskeletal:  Negative for arthralgias and back pain.  Skin:  Negative for color change and rash.  Neurological:  Positive for syncope. Negative for seizures.  All other systems reviewed and are negative.   Updated Vital Signs BP 111/67   Pulse 92   Temp 98.1 F (36.7 C)   Resp 17   SpO2 100%   Physical Exam Vitals and nursing note reviewed.  Constitutional:      General: He is not in acute distress.    Appearance: Normal appearance. He is well-developed. He is not ill-appearing.  HENT:     Head: Normocephalic and atraumatic.     Mouth/Throat:     Mouth: Mucous membranes are dry.  Eyes:     Extraocular Movements: Extraocular movements intact.     Conjunctiva/sclera: Conjunctivae normal.     Pupils: Pupils are equal, round, and reactive to light.  Cardiovascular:     Rate and Rhythm: Normal rate and regular rhythm.     Heart sounds: No murmur heard. Pulmonary:     Effort: Pulmonary effort is normal. No respiratory distress.     Breath sounds: Normal breath sounds.  Abdominal:     Palpations: Abdomen is soft.     Tenderness: There is no abdominal tenderness.  Musculoskeletal:        General: No swelling.     Cervical back: Normal range of motion and neck supple. No rigidity.  Skin:    General: Skin is warm and dry.     Capillary Refill: Capillary refill takes less than 2 seconds.  Neurological:     General: No focal deficit present.     Mental Status: He is alert and oriented to person, place, and time.      Cranial Nerves: No cranial nerve deficit.     Sensory: No sensory deficit.     Motor: No weakness.     Comments: Some weakness bilaterally lower extremities.  But able to pick both legs up off the bed.  Previously had deficits to the left arm which is recovered from the previous stroke.  Psychiatric:        Mood and Affect: Mood normal.     (all labs ordered are listed, but only abnormal results are displayed) Labs Reviewed  BASIC METABOLIC PANEL WITH GFR - Abnormal; Notable for the following components:  Result Value   Sodium 132 (*)    Glucose, Bld 129 (*)    BUN 29 (*)    Calcium  8.6 (*)    All other components within normal limits  CBC WITH DIFFERENTIAL/PLATELET - Abnormal; Notable for the following components:   WBC 1.6 (*)    RBC 2.92 (*)    Hemoglobin 8.5 (*)    HCT 27.2 (*)    RDW 20.9 (*)    Platelets 126 (*)    Neutro Abs 1.2 (*)    Lymphs Abs 0.1 (*)    All other components within normal limits  HEPATIC FUNCTION PANEL - Abnormal; Notable for the following components:   Albumin 3.3 (*)    AST 45 (*)    ALT 93 (*)    All other components within normal limits  CBG MONITORING, ED - Abnormal; Notable for the following components:   Glucose-Capillary 108 (*)    All other components within normal limits  LIPASE, BLOOD    EKG: EKG Interpretation Date/Time:  Wednesday October 06 2023 15:26:45 EDT Ventricular Rate:  101 PR Interval:  217 QRS Duration:  87 QT Interval:  330 QTC Calculation: 428 R Axis:   -55  Text Interpretation: Sinus tachycardia Prolonged PR interval Left anterior fascicular block Consider anterior infarct No significant change since last tracing Confirmed by Taquana Bartley [phone]) on 10/06/2023 4:02:03 PM  Radiology: US  Abdomen Complete Result Date: 10/05/2023 CLINICAL DATA:  Elevated liver function tests, right upper quadrant abdominal pain. EXAM: ABDOMEN ULTRASOUND COMPLETE COMPARISON:  None Available. FINDINGS: Gallbladder: No gallstones  or wall thickening visualized. No sonographic Murphy sign noted by sonographer. Foci of echogenicity seen within gallbladder wall suggesting adenomyomatosis. Common bile duct: Diameter: 5 mm which is within normal limits. Liver: No focal lesion identified. Within normal limits in parenchymal echogenicity. Portal vein is patent on color Doppler imaging with normal direction of blood flow towards the liver. IVC: No abnormality visualized. Pancreas: Not visualized due to overlying bowel gas. Spleen: Size and appearance within normal limits. Right Kidney: Length: 9.7 cm. Echogenicity within normal limits. No mass or hydronephrosis visualized. Left Kidney: Length: 10.4 cm. 1.8 cm cyst is noted. Echogenicity within normal limits. No mass or hydronephrosis visualized. Abdominal aorta: 4.1 cm infrarenal abdominal aortic aneurysm. Other findings: None. IMPRESSION: Pancreas not visualized due to overlying bowel gas. 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 4.1 cm infrarenal abdominal aortic aneurysm. Recommend follow-up CT or MR as appropriate in 12 months and referral to or continued care with vascular specialist. (Ref.: J Vasc Surg. 2018; 67:2-77 and J Am Coll Radiol 2013;10(10):789-794.) Electronically Signed   By: Lynwood Landy Raddle M.D.   On: 10/05/2023 09:14     Procedures   Medications Ordered in the ED  0.9 %  sodium chloride  infusion ( Intravenous New Bag/Given 10/06/23 1659)  sodium chloride  0.9 % bolus 500 mL (499 mLs Intravenous New Bag/Given 10/06/23 1646)                                    Medical Decision Making Amount and/or Complexity of Data Reviewed Labs: ordered. Radiology: ordered.  Risk Prescription drug management.   Hepatic function AST 45 ALT 93 but much better than it was on August 11.  Were both those were very high.  Total bili is normal alk phos is normal.  Lipase is normal.  Sodium down a little bit at 132 renal function GFR greater than  60.  Glucose 129.  CBC white count 1.6  hemoglobin 8.5 platelets 126.  Two-view chest x-ray is pending.  Patient EKG cardiac monitoring here without any arrhythmia.  Patient is a clinically.  A little dry.  Will give some fluids.  Patient remaining stable here.  Chest x-ray without any new or worse findings.  Right perihilar mass corresponds and no malignancy is they suspect a small right pleural effusion patient's oxygen saturations have been good.  Patient given some IV fluids here.  If still feeling well should be stable for discharge home and close follow-up with oncology.  In addition cardiac monitoring without evidence of any significant arrhythmias.   Final diagnoses:  Syncope, unspecified syncope type    ED Discharge Orders     None          Geraldene Hamilton, MD 10/06/23 [id number]    Geraldene Hamilton, MD 10/06/23 8066    Geraldene Hamilton, MD 10/06/23 [id number]

## 2023-10-07 ENCOUNTER — Ambulatory Visit
Admission: RE | Admit: 2023-10-07 | Discharge: 2023-10-07 | Disposition: A | Discharge status: Home / Self Care | Source: Ambulatory Visit | Attending: Radiation Oncology | Admitting: Radiation Oncology

## 2023-10-07 ENCOUNTER — Ambulatory Visit

## 2023-10-07 ENCOUNTER — Other Ambulatory Visit: Payer: Self-pay

## 2023-10-07 ENCOUNTER — Telehealth: Payer: Self-pay | Admitting: Medical Oncology

## 2023-10-07 DIAGNOSIS — J449 Chronic obstructive pulmonary disease, unspecified: Secondary | ICD-10-CM | POA: Diagnosis not present

## 2023-10-07 DIAGNOSIS — Z5111 Encounter for antineoplastic chemotherapy: Secondary | ICD-10-CM | POA: Diagnosis not present

## 2023-10-07 DIAGNOSIS — Z452 Encounter for adjustment and management of vascular access device: Secondary | ICD-10-CM | POA: Diagnosis not present

## 2023-10-07 DIAGNOSIS — Z8551 Personal history of malignant neoplasm of bladder: Secondary | ICD-10-CM | POA: Diagnosis not present

## 2023-10-07 DIAGNOSIS — R7989 Other specified abnormal findings of blood chemistry: Secondary | ICD-10-CM | POA: Diagnosis not present

## 2023-10-07 DIAGNOSIS — K5900 Constipation, unspecified: Secondary | ICD-10-CM | POA: Diagnosis not present

## 2023-10-07 DIAGNOSIS — D701 Agranulocytosis secondary to cancer chemotherapy: Secondary | ICD-10-CM | POA: Diagnosis not present

## 2023-10-07 DIAGNOSIS — C3431 Malignant neoplasm of lower lobe, right bronchus or lung: Secondary | ICD-10-CM | POA: Diagnosis not present

## 2023-10-07 DIAGNOSIS — Z510 Encounter for antineoplastic radiation therapy: Secondary | ICD-10-CM | POA: Diagnosis not present

## 2023-10-07 LAB — RAD ONC ARIA SESSION SUMMARY
Course Elapsed Days: 45
Plan Fractions Treated to Date: 2
Plan Prescribed Dose Per Fraction: 2 Gy
Plan Total Fractions Prescribed: 3
Plan Total Prescribed Dose: 6 Gy
Reference Point Dosage Given to Date: 4 Gy
Reference Point Session Dosage Given: 2 Gy
Session Number: 32

## 2023-10-07 NOTE — Telephone Encounter (Signed)
 Wife reported that pt Passed out  at Cancer center and taken to ED . Received IVF then d/c. LFTs better. Pt has f/u appt next week . Receiving xrt  with final tx tomorrow.  Wife asked to let Dr. Sherrod know of the event.   F/U next wed.

## 2023-10-08 ENCOUNTER — Telehealth: Payer: Self-pay | Admitting: Medical Oncology

## 2023-10-08 ENCOUNTER — Ambulatory Visit
Admission: RE | Admit: 2023-10-08 | Discharge: 2023-10-08 | Disposition: A | Discharge status: Home / Self Care | Source: Ambulatory Visit | Attending: Radiation Oncology | Admitting: Radiation Oncology

## 2023-10-08 ENCOUNTER — Other Ambulatory Visit: Payer: Self-pay

## 2023-10-08 ENCOUNTER — Other Ambulatory Visit: Payer: Self-pay | Admitting: Medical Oncology

## 2023-10-08 ENCOUNTER — Inpatient Hospital Stay

## 2023-10-08 DIAGNOSIS — E860 Dehydration: Secondary | ICD-10-CM

## 2023-10-08 DIAGNOSIS — Z8551 Personal history of malignant neoplasm of bladder: Secondary | ICD-10-CM | POA: Diagnosis not present

## 2023-10-08 DIAGNOSIS — Z5111 Encounter for antineoplastic chemotherapy: Secondary | ICD-10-CM | POA: Diagnosis not present

## 2023-10-08 DIAGNOSIS — J449 Chronic obstructive pulmonary disease, unspecified: Secondary | ICD-10-CM | POA: Diagnosis not present

## 2023-10-08 DIAGNOSIS — D701 Agranulocytosis secondary to cancer chemotherapy: Secondary | ICD-10-CM | POA: Diagnosis not present

## 2023-10-08 DIAGNOSIS — R7989 Other specified abnormal findings of blood chemistry: Secondary | ICD-10-CM | POA: Diagnosis not present

## 2023-10-08 DIAGNOSIS — Z510 Encounter for antineoplastic radiation therapy: Secondary | ICD-10-CM | POA: Diagnosis not present

## 2023-10-08 DIAGNOSIS — C3431 Malignant neoplasm of lower lobe, right bronchus or lung: Secondary | ICD-10-CM | POA: Diagnosis not present

## 2023-10-08 DIAGNOSIS — Z87891 Personal history of nicotine dependence: Secondary | ICD-10-CM | POA: Diagnosis not present

## 2023-10-08 DIAGNOSIS — K5900 Constipation, unspecified: Secondary | ICD-10-CM | POA: Diagnosis not present

## 2023-10-08 DIAGNOSIS — Z452 Encounter for adjustment and management of vascular access device: Secondary | ICD-10-CM | POA: Diagnosis not present

## 2023-10-08 LAB — RAD ONC ARIA SESSION SUMMARY
Course Elapsed Days: 46
Plan Fractions Treated to Date: 3
Plan Prescribed Dose Per Fraction: 2 Gy
Plan Total Fractions Prescribed: 3
Plan Total Prescribed Dose: 6 Gy
Reference Point Dosage Given to Date: 6 Gy
Reference Point Session Dosage Given: 2 Gy
Session Number: 33

## 2023-10-08 MED ORDER — SODIUM CHLORIDE 0.9 % IV SOLN: INTRAVENOUS | Status: DC

## 2023-10-08 NOTE — Patient Instructions (Signed)

## 2023-10-08 NOTE — Progress Notes (Signed)
 IVF ordered and wife notified.

## 2023-10-08 NOTE — Telephone Encounter (Signed)
 Pt started medrol  dose pak on wed. ( For elevated LFT's)  Since starting them he has felt dizzy. In the ED on wed his LFT s came down . Today before xrt he said he felt like he was going to pass out .   Per Dr. Tina IVF ordered and wife notified to complete medrol  dose pak.

## 2023-10-09 ENCOUNTER — Other Ambulatory Visit: Payer: Self-pay | Admitting: Acute Care

## 2023-10-09 DIAGNOSIS — J069 Acute upper respiratory infection, unspecified: Secondary | ICD-10-CM

## 2023-10-09 DIAGNOSIS — R051 Acute cough: Secondary | ICD-10-CM

## 2023-10-10 NOTE — Progress Notes (Unsigned)
 [facility] OFFICE PROGRESS NOTE  [name], FNP 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 Stage 3B due to tumor size and lymph node involvement. Surgery is not feasible due to anatomical involvement, age, and COPD. Diagnosed in June 2025   PDL1: 0%  PRIOR THERAPY: Concurrent chemoradiation with weekly Carboplatin  for AUC 2 and Paclitaxel  45 mg/m2.  First dose on 08/23/2023. Status post 6 cycles.   CURRENT THERAPY:  None    INTERVAL HISTORY: Stephen Meza 89 y.o. male returns to the clinic today for a follow up visit. The patient recently completed 6 cycles of chemoRT. He was found to have elevated LFTs and was given a medrol  dosepack. Tylenol ? Taking tylenol  for the rib discomfort? He also came to the clinic for IVF. He also had went to the ER on 10/06/23.   Since last week, he is feeling ***.; He reports fatigue and ***.   He denies any fever, or chills.  Night sweats?  He had been previously seeing a member the nutritionist team.  His appetite and weight have Has**.  Tessalon  for cough?  Chest discomfort?  Shortness of breath?  He denies any hemoptysis.  He denies any nausea, vomiting, diarrhea, or constipation.  He denies any headache or visual changes.  He is here today for evaluation repeat blood work.  He is scheduled for restaging CT scan around 10/26/2023    MEDICAL HISTORY: Past Medical History:  Diagnosis Date   Bladder cancer (HCC) 03/19/2016   DUODENITIS, WITHOUT HEMORRHAGE 07/14/2007   ERECTILE DYSFUNCTION 07/14/2007   Esophageal stricture 03/19/2016   ESOPHAGITIS 07/14/2007   EXTERNAL HEMORRHOIDS 07/14/2007   GERD 07/14/2007   Gout    HYPERTENSION 07/14/2007   PERIPHERAL NEUROPATHY 07/14/2007   Peripheral neuropathy 04/06/2019   PERIPHERAL VASCULAR DISEASE  07/14/2007   PODAGRA 07/14/2007   Prostate cancer (HCC) 02/2012   pt had screening and then biopsy   Prostate cancer (HCC) 04/13/2012   Mild rise in PSA lead to prostate biopsy - 1:11 positive for adenocarcinoma. CT abd/pelvis was negative except for enlarged prostate.     Treatment - hormonal     Retinal tear of right eye 03/19/2016   Stroke (HCC) 05/09/2023   TOBACCO ABUSE, HX OF 02/12/2010   TONSILLECTOMY, HX OF 07/14/2007   TREMOR, ESSENTIAL 07/14/2007   VITAMIN B12 DEFICIENCY 07/14/2007    ALLERGIES:  is allergic to ciprofloxacin hcl, statins, and nitrofuran derivatives.  MEDICATIONS:  Current Outpatient Medications  Medication Sig Dispense Refill   albuterol  (VENTOLIN  HFA) 108 (90 Base) MCG/ACT inhaler Take 1 puff as needed for shortness of breath or wheezing, 8.5 g 2   allopurinol  (ZYLOPRIM ) 100 MG tablet TAKE 1 TABLET BY MOUTH EVERY DAY 90 tablet 4   aspirin  EC 81 MG tablet Take 1 tablet (81 mg total) by mouth daily. Okay to restart on 07/27/2023.  Swallow whole.     benzonatate  (TESSALON ) 100 MG capsule TAKE 1 CAPSULE (100 MG TOTAL) BY MOUTH EVERY 6 (SIX) HOURS AS NEEDED FOR COUGH 30 capsule 0   carvedilol  (COREG ) 3.125 MG tablet TAKE 1 TABLET BY MOUTH TWICE A DAY WITH A MEAL 180 tablet 1   clopidogrel  (PLAVIX ) 75 MG tablet TAKE 1 TABLET BY MOUTH EVERY DAY 30 tablet 2   Cyanocobalamin  (B-12) 3000 MCG CAPS Take 3,000 mcg  by mouth daily.     diclofenac  Sodium (VOLTAREN ) 1 % GEL Apply 4 g topically 4 (four) times daily. 150 g 3   docusate sodium  (COLACE) 100 MG capsule Take 100 mg by mouth 2 (two) times daily.     ezetimibe  (ZETIA ) 10 MG tablet TAKE 1 TABLET BY MOUTH EVERY DAY 90 tablet 1   lidocaine -prilocaine  (EMLA ) cream Apply 1 Application topically as needed. 30 g 0   methylPREDNISolone  (MEDROL ) 4 MG tablet Take 6 tabs po day 1, 5 tabs po day 2, 4 tabs po day 3, 3 tabs po day 4, 2 tabs po day 5, 1 tab po day 6 then stop 21 tablet 0   omeprazole  (PRILOSEC) 40 MG capsule TAKE 1  CAPSULE BY MOUTH EVERY DAY 90 capsule 2   ondansetron  (ZOFRAN -ODT) 4 MG disintegrating tablet Take 1 tablet (4 mg total) by mouth every 8 (eight) hours as needed for nausea or vomiting. 20 tablet 0   polyethylene glycol (MIRALAX ) 17 g packet Take 17 g by mouth daily as needed for moderate constipation or severe constipation (not helped with stool softner). 10 each 0   sucralfate  (CARAFATE ) 1 g tablet Take 1 tablet (1 g total) by mouth 4 (four) times daily. Dissolve each tablet in 15 cc water before use. 120 tablet 2   tamsulosin  (FLOMAX ) 0.4 MG CAPS capsule Take 0.4 mg by mouth at bedtime.     No current facility-administered medications for this visit.    SURGICAL HISTORY:  Past Surgical History:  Procedure Laterality Date   BRONCHIAL BIOPSY  07/26/2023   Procedure: BRONCHOSCOPY, WITH BIOPSY;  Surgeon: [name], MD;  Location: [facility] ENDOSCOPY;  Service: Pulmonary;;   BRONCHIAL BRUSHINGS  07/26/2023   Procedure: BRONCHOSCOPY, WITH BRUSH BIOPSY;  Surgeon: [name], MD;  Location: [facility] ENDOSCOPY;  Service: Pulmonary;;   Excision of lipoma     IR IMAGING GUIDED PORT INSERTION  08/16/2023   PROSTATE BIOPSY Bilateral 03/10/2012   surgery to reduce turbinate and straighten deviated septum  2016   TONSILLECTOMY     TRANSCAROTID ARTERY REVASCULARIZATION  Right 05/14/2023   Procedure: TRANSCAROTID ARTERY REVASCULARIZATION (TCAR) UISNG 10mm X 40mm ENROUTE TRANSCAROTID STENT SYSTEM;  Surgeon: Lanis Fonda BRAVO, MD;  Location: [facility] OR;  Service: Vascular;  Laterality: Right;   VIDEO BRONCHOSCOPY  07/26/2023   Procedure: VIDEO BRONCHOSCOPY WITHOUT FLUORO;  Surgeon: [name], MD;  Location: [facility] ENDOSCOPY;  Service: Pulmonary;;    REVIEW OF SYSTEMS:   Review of Systems  Constitutional: Negative for appetite change, chills, fatigue, fever and unexpected weight change.  HENT:   Negative for mouth sores, nosebleeds, sore throat and trouble swallowing.   Eyes: Negative for eye problems and icterus.   Respiratory: Negative for cough, hemoptysis, shortness of breath and wheezing.   Cardiovascular: Negative for chest pain and leg swelling.  Gastrointestinal: Negative for abdominal pain, constipation, diarrhea, nausea and vomiting.  Genitourinary: Negative for bladder incontinence, difficulty urinating, dysuria, frequency and hematuria.   Musculoskeletal: Negative for back pain, gait problem, neck pain and neck stiffness.  Skin: Negative for itching and rash.  Neurological: Negative for dizziness, extremity weakness, gait problem, headaches, light-headedness and seizures.  Hematological: Negative for adenopathy. Does not bruise/bleed easily.  Psychiatric/Behavioral: Negative for confusion, depression and sleep disturbance. The patient is not nervous/anxious.     PHYSICAL EXAMINATION:  There were no vitals taken for this visit.  ECOG PERFORMANCE STATUS: {CHL ONC ECOG [id number]  Physical Exam  Constitutional: Oriented to person, place,  and time and well-developed, well-nourished, and in no distress. No distress.  HENT:  Head: Normocephalic and atraumatic.  Mouth/Throat: Oropharynx is clear and moist. No oropharyngeal exudate.  Eyes: Conjunctivae are normal. Right eye exhibits no discharge. Left eye exhibits no discharge. No scleral icterus.  Neck: Normal range of motion. Neck supple.  Cardiovascular: Normal rate, regular rhythm, normal heart sounds and intact distal pulses.   Pulmonary/Chest: Effort normal and breath sounds normal. No respiratory distress. No wheezes. No rales.  Abdominal: Soft. Bowel sounds are normal. Exhibits no distension and no mass. There is no tenderness.  Musculoskeletal: Normal range of motion. Exhibits no edema.  Lymphadenopathy:    No cervical adenopathy.  Neurological: Alert and oriented to person, place, and time. Exhibits normal muscle tone. Gait normal. Coordination normal.  Skin: Skin is warm and dry. No rash noted. Not diaphoretic. No erythema. No  pallor.  Psychiatric: Mood, memory and judgment normal.  Vitals reviewed.  LABORATORY DATA: Lab Results  Component Value Date   WBC 1.6 (L) 10/06/2023   HGB 8.5 (L) 10/06/2023   HCT 27.2 (L) 10/06/2023   MCV 93.2 10/06/2023   PLT 126 (L) 10/06/2023      Chemistry      Component Value Date/Time   NA 132 (L) 10/06/2023 1553   K 3.6 10/06/2023 1553   CL 99 10/06/2023 1553   CO2 22 10/06/2023 1553   BUN 29 (H) 10/06/2023 1553   CREATININE 0.84 10/06/2023 1553   CREATININE 0.63 10/04/2023 1335      Component Value Date/Time   CALCIUM  8.6 (L) 10/06/2023 1553   ALKPHOS 81 10/06/2023 1651   AST 45 (H) 10/06/2023 1651   AST 261 (HH) 10/04/2023 1335   ALT 93 (H) 10/06/2023 1651   ALT 248 (H) 10/04/2023 1335   BILITOT 1.0 10/06/2023 1651   BILITOT 1.5 (H) 10/04/2023 1335       RADIOGRAPHIC STUDIES:  DG Chest Port 1 View Result Date: 10/06/2023 CLINICAL DATA:  Syncope. EXAM: PORTABLE CHEST 1 VIEW COMPARISON:  Radiograph 06/04/2023, chest CT 08/10/2023 FINDINGS: Lung volumes are low. Right chest port remains in place. Aortic atherosclerosis and tortuosity as before. Right perihilar masslike opacity, increasing surrounding spiculation from prior radiograph. Suspect small right pleural effusion. Underlying chronic lung disease. No pulmonary edema. No pneumothorax. IMPRESSION: 1. Right perihilar masslike opacity, corresponds to known malignancy. 2. Suspect small right pleural effusion. 3. Underlying chronic lung disease. Chronic aortic atherosclerosis and tortuosity. Electronically Signed   By: Andrea Gasman M.D.   On: 10/06/2023 18:49   US  Abdomen Complete Result Date: 10/05/2023 CLINICAL DATA:  Elevated liver function tests, right upper quadrant abdominal pain. EXAM: ABDOMEN ULTRASOUND COMPLETE COMPARISON:  None Available. FINDINGS: Gallbladder: No gallstones or wall thickening visualized. No sonographic Murphy sign noted by sonographer. Foci of echogenicity seen within gallbladder  wall suggesting adenomyomatosis. Common bile duct: Diameter: 5 mm which is within normal limits. Liver: No focal lesion identified. Within normal limits in parenchymal echogenicity. Portal vein is patent on color Doppler imaging with normal direction of blood flow towards the liver. IVC: No abnormality visualized. Pancreas: Not visualized due to overlying bowel gas. Spleen: Size and appearance within normal limits. Right Kidney: Length: 9.7 cm. Echogenicity within normal limits. No mass or hydronephrosis visualized. Left Kidney: Length: 10.4 cm. 1.8 cm cyst is noted. Echogenicity within normal limits. No mass or hydronephrosis visualized. Abdominal aorta: 4.1 cm infrarenal abdominal aortic aneurysm. Other findings: None. IMPRESSION: Pancreas not visualized due to overlying bowel gas. 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 4.1 cm infrarenal abdominal aortic aneurysm. Recommend follow-up CT or MR as appropriate in 12 months and referral to or continued care with vascular specialist. (Ref.: J Vasc Surg. 2018; 67:2-77 and J Am Coll Radiol 2013;10(10):789-794.) Electronically Signed   By: Lynwood Landy Raddle M.D.   On: 10/05/2023 09:14     ASSESSMENT/PLAN:  This is a very pleasant 89 year old Caucasian male diagnosed with stage IIIb (t3, N2b, M0) non-small cell lung cancer, squamous cell carcinoma.  The patient presented with a right upper lobe and lower lobe and with mediastinal lymph node involvement.   The patient was diagnosed in June 2025.   His PDL1 expression is 0%.   The patient is currently undergoing a course of concurrent chemoradiation with carboplatin  for an AUC of 2 and paclitaxel  45 mg/m.  He is status post 6 cycle and tolerated it well.   He has been having elevated LFTs.   He had repeat labs today. The patient was last seen with Dr. Sherrod. I will arrange for ***  He is scheduled for a repeat CT scan on *** to assess his treatment response.;   We will see him back after the CT scan to  review the results and next steps.   ***tylenol  ***  Prostate cancer Previous diagnosis with one positive biopsy out of twelve samples.   Bladder cancer Bladder cancer with previous removal of a lesion. He will check with urology if ok to resume flomax . I don't see this as being an issue with his treatment but may affect his orthostatic hypotension. He was advised to change positions slowly.   Managed with iron  supplementation every other day. - Continue iron  supplementation every other day. - Monitor hemoglobin levels regularly.   - Ensure adequate hydration and nutrition.     The patient was advised to call immediately if he has any concerning symptoms in the interval. The patient voices understanding of current disease status and treatment options and is in agreement with the current care plan. All questions were answered. The patient knows to call the clinic with any problems, questions or concerns. We can certainly see the patient much sooner if necessary    No orders of the defined types were placed in this encounter.    I spent {CHL ONC TIME VISIT - DTPQU:[id number]} counseling the patient face to face. The total time spent in the appointment was {CHL ONC TIME VISIT - DTPQU:[id number]}.  Kanishk Stroebel L Eliannah Hinde, PA-C 10/10/23

## 2023-10-11 ENCOUNTER — Other Ambulatory Visit: Payer: Self-pay | Admitting: Family Medicine

## 2023-10-11 DIAGNOSIS — K5909 Other constipation: Secondary | ICD-10-CM

## 2023-10-11 DIAGNOSIS — Z8673 Personal history of transient ischemic attack (TIA), and cerebral infarction without residual deficits: Secondary | ICD-10-CM

## 2023-10-11 DIAGNOSIS — R051 Acute cough: Secondary | ICD-10-CM

## 2023-10-11 DIAGNOSIS — J069 Acute upper respiratory infection, unspecified: Secondary | ICD-10-CM

## 2023-10-11 MED ORDER — DOCUSATE SODIUM 100 MG PO CAPS: 100.0000 mg | ORAL_CAPSULE | Freq: Two times a day (BID) | ORAL | 2 refills | Status: DC

## 2023-10-11 MED ORDER — POLYETHYLENE GLYCOL 3350 17 G PO PACK: 17.0000 g | PACK | Freq: Every day | ORAL | 0 refills | Status: DC | PRN

## 2023-10-11 MED ORDER — CLOPIDOGREL BISULFATE 75 MG PO TABS: 75.0000 mg | ORAL_TABLET | Freq: Every day | ORAL | 2 refills | Status: DC

## 2023-10-11 NOTE — Radiation Completion Notes (Addendum)
  Radiation Oncology         [phone] ________________________________  Name: Jenel A. Estis MRN: [id number]  Date of Service: 10/08/2023  DOB: 22-Jan-1934  End of Treatment Note  Diagnosis: Stage IIIB, cT3N2M0, NSCLC, Squamous Cell Carcinoma of the RLL invading the hilum and RUL   Intent: Curative     ==========DELIVERED PLANS==========  First Treatment Date: 2023-08-23 Last Treatment Date: 2023-10-08   Plan Name: Lung_R Site: Lung, Right Technique: 3D Mode: Photon Dose Per Fraction: 2 Gy Prescribed Dose (Delivered / Prescribed): 60 Gy / 60 Gy Prescribed Fxs (Delivered / Prescribed): 30 / 30   Plan Name: Lung_R_Bst Site: Lung, Right Technique: 3D Mode: Photon Dose Per Fraction: 2 Gy Prescribed Dose (Delivered / Prescribed): 6 Gy / 6 Gy Prescribed Fxs (Delivered / Prescribed): 3 / 3     ==========ON TREATMENT VISIT DATES========== 2023-08-26, 2023-09-03, 2023-09-10, 2023-09-17, 2023-09-24, 2023-10-01, 2023-10-08    See weekly On Treatment Notes in Epic for details in the Media tab (listed as Progress notes on the On Treatment Visit Dates listed above). The patient tolerated radiation. He developed fatigue and anticipated skin changes in the treatment field. He did not complain of esophagitis  The patient will receive a call in about one month from the radiation oncology department. He will continue follow up with Dr. Sherrod as well. He will see Dr. Buckley as needed for management of his multifocal brain infarcts.     Donald KYM Husband, PAC

## 2023-10-11 NOTE — Telephone Encounter (Signed)
 Copied from CRM #[id number]. Topic: Clinical - Medication Refill >> Oct 11, 2023  2:51 PM Leah C wrote: Medication: clopidogrel  (PLAVIX ) 75 MG tablet; benzonatate  (TESSALON ) 100 MG capsule, polyethylene glycol (MIRALAX ) 17 g packet, docusate sodium  (COLACE) 100 MG capsule  Has the patient contacted their pharmacy? Patient's spouse [facility] to [facility]ills but they stated to call Clinic because some medications are under different doctors, and they didn't receive the refill request for the blood thinner plavix .    This is the patient's preferred pharmacy:  [id number] [place], [place] - [address] [facility] [place] [address] Phone: [phone] Fax: [phone]  Is this the correct pharmacy for this prescription? Yes If no, delete pharmacy and type the correct one.   Has the prescription been filled recently? No  Is the patient out of the medication? Yes  Has the patient been seen for an appointment in the last year OR does the patient have an upcoming appointment? Yes  Can we respond through MyChart? Yes  Agent: Please be advised that Rx refills may take up to 3 business days. We ask that you follow-up with your pharmacy.

## 2023-10-12 ENCOUNTER — Other Ambulatory Visit: Payer: Self-pay | Admitting: Medical Oncology

## 2023-10-12 DIAGNOSIS — C3431 Malignant neoplasm of lower lobe, right bronchus or lung: Secondary | ICD-10-CM

## 2023-10-13 ENCOUNTER — Inpatient Hospital Stay: Admitting: Physician Assistant

## 2023-10-13 ENCOUNTER — Inpatient Hospital Stay

## 2023-10-13 VITALS — BP 102/67 | HR 100 | Temp 97.2°F | Resp 16 | Wt 154.4 lb

## 2023-10-13 DIAGNOSIS — C3431 Malignant neoplasm of lower lobe, right bronchus or lung: Secondary | ICD-10-CM

## 2023-10-13 DIAGNOSIS — Z452 Encounter for adjustment and management of vascular access device: Secondary | ICD-10-CM | POA: Diagnosis not present

## 2023-10-13 DIAGNOSIS — J449 Chronic obstructive pulmonary disease, unspecified: Secondary | ICD-10-CM | POA: Diagnosis not present

## 2023-10-13 DIAGNOSIS — Z8551 Personal history of malignant neoplasm of bladder: Secondary | ICD-10-CM | POA: Diagnosis not present

## 2023-10-13 DIAGNOSIS — R7989 Other specified abnormal findings of blood chemistry: Secondary | ICD-10-CM | POA: Diagnosis not present

## 2023-10-13 DIAGNOSIS — D701 Agranulocytosis secondary to cancer chemotherapy: Secondary | ICD-10-CM | POA: Diagnosis not present

## 2023-10-13 DIAGNOSIS — Z5111 Encounter for antineoplastic chemotherapy: Secondary | ICD-10-CM | POA: Diagnosis not present

## 2023-10-13 DIAGNOSIS — Z510 Encounter for antineoplastic radiation therapy: Secondary | ICD-10-CM | POA: Diagnosis not present

## 2023-10-13 DIAGNOSIS — K5900 Constipation, unspecified: Secondary | ICD-10-CM | POA: Diagnosis not present

## 2023-10-13 LAB — CMP (CANCER CENTER ONLY)
ALT: 38 U/L (ref 0–44)
AST: 21 U/L (ref 15–41)
Albumin: 3.9 g/dL (ref 3.5–5.0)
Alkaline Phosphatase: 71 U/L (ref 38–126)
Anion gap: 6 (ref 5–15)
BUN: 16 mg/dL (ref 8–23)
CO2: 29 mmol/L (ref 22–32)
Calcium: 9.1 mg/dL (ref 8.9–10.3)
Chloride: 102 mmol/L (ref 98–111)
Creatinine: 0.62 mg/dL (ref 0.61–1.24)
GFR, Estimated: >60 mL/min (ref >60)
Glucose, Bld: 96 mg/dL (ref 70–99)
Potassium: 3.8 mmol/L (ref 3.5–5.1)
Sodium: 137 mmol/L (ref 135–145)
Total Bilirubin: 0.4 mg/dL (ref 0.0–1.2)
Total Protein: 6.9 g/dL (ref 6.5–8.1)

## 2023-10-13 LAB — CBC WITH DIFFERENTIAL (CANCER CENTER ONLY)
Abs Immature Granulocytes: 0.01 K/uL (ref 0.00–0.07)
Basophils Absolute: 0 K/uL (ref 0.0–0.1)
Basophils Relative: 1 %
Eosinophils Absolute: 0 K/uL (ref 0.0–0.5)
Eosinophils Relative: 1 %
HCT: 30.6 % — ABNORMAL LOW (ref 39.0–52.0)
Hemoglobin: 9.8 g/dL — ABNORMAL LOW (ref 13.0–17.0)
Immature Granulocytes: 0 %
Lymphocytes Relative: 6 %
Lymphs Abs: 0.2 K/uL — ABNORMAL LOW (ref 0.7–4.0)
MCH: 30.2 pg (ref 26.0–34.0)
MCHC: 32 g/dL (ref 30.0–36.0)
MCV: 94.2 fL (ref 80.0–100.0)
Monocytes Absolute: 0.6 K/uL (ref 0.1–1.0)
Monocytes Relative: 19 %
Neutro Abs: 2.4 K/uL (ref 1.7–7.7)
Neutrophils Relative %: 73 %
Platelet Count: 193 K/uL (ref 150–400)
RBC: 3.25 MIL/uL — ABNORMAL LOW (ref 4.22–5.81)
RDW: 22.7 % — ABNORMAL HIGH (ref 11.5–15.5)
WBC Count: 3.3 K/uL — ABNORMAL LOW (ref 4.0–10.5)
nRBC: 0 % (ref 0.0–0.2)

## 2023-10-13 LAB — SAMPLE TO BLOOD BANK

## 2023-10-19 ENCOUNTER — Other Ambulatory Visit

## 2023-10-19 ENCOUNTER — Ambulatory Visit: Admitting: Physician Assistant

## 2023-10-21 ENCOUNTER — Telehealth: Payer: Self-pay

## 2023-10-21 NOTE — Telephone Encounter (Signed)
 LVM for patient to return call.

## 2023-10-21 NOTE — Telephone Encounter (Signed)
 Copied from CRM [id number]. Topic: Appointments - Appointment Scheduling >> Oct 21, 2023 10:50 AM Stephen Meza wrote: Patient/patient representative is calling to schedule an appointment. Refer to attachments for appointment information. Patient is calling to schedule labs before his office visit with Corean Ku on 11/08/2023; however, no active orders.

## 2023-10-22 ENCOUNTER — Encounter: Payer: Self-pay | Admitting: Pharmacist

## 2023-10-22 NOTE — Progress Notes (Signed)
 Pharmacy Quality Measure Review   This patient is appearing on a report for being at risk of failing the adherence measure for cholesterol (statin) medications this calendar year.   Medication: Rosuvastatin  20mg   Last fill date: 08/24/2023 for 90 day supply   Insurance report was not up to date. No action needed at this time.   Lum Ricks, PharmD Candidate  [facility] [employer]   Darrelyn Drum, PharmD, [employer], CPP Clinical Pharmacist Practitioner [facility] [phone]

## 2023-10-26 ENCOUNTER — Ambulatory Visit (HOSPITAL_COMMUNITY)
Admission: RE | Admit: 2023-10-26 | Discharge: 2023-10-26 | Disposition: A | Discharge status: Home / Self Care | Source: Ambulatory Visit | Attending: Internal Medicine | Admitting: Internal Medicine

## 2023-10-26 ENCOUNTER — Telehealth: Payer: Self-pay

## 2023-10-26 ENCOUNTER — Other Ambulatory Visit: Payer: Self-pay | Admitting: Physician Assistant

## 2023-10-26 ENCOUNTER — Inpatient Hospital Stay: Attending: Physician Assistant

## 2023-10-26 ENCOUNTER — Other Ambulatory Visit

## 2023-10-26 DIAGNOSIS — C3490 Malignant neoplasm of unspecified part of unspecified bronchus or lung: Secondary | ICD-10-CM | POA: Insufficient documentation

## 2023-10-26 DIAGNOSIS — D649 Anemia, unspecified: Secondary | ICD-10-CM | POA: Insufficient documentation

## 2023-10-26 DIAGNOSIS — C3431 Malignant neoplasm of lower lobe, right bronchus or lung: Secondary | ICD-10-CM

## 2023-10-26 DIAGNOSIS — I499 Cardiac arrhythmia, unspecified: Secondary | ICD-10-CM

## 2023-10-26 DIAGNOSIS — R1011 Right upper quadrant pain: Secondary | ICD-10-CM | POA: Diagnosis not present

## 2023-10-26 DIAGNOSIS — I714 Abdominal aortic aneurysm, without rupture, unspecified: Secondary | ICD-10-CM | POA: Diagnosis not present

## 2023-10-26 DIAGNOSIS — R269 Unspecified abnormalities of gait and mobility: Secondary | ICD-10-CM | POA: Insufficient documentation

## 2023-10-26 DIAGNOSIS — J700 Acute pulmonary manifestations due to radiation: Secondary | ICD-10-CM | POA: Diagnosis not present

## 2023-10-26 DIAGNOSIS — R918 Other nonspecific abnormal finding of lung field: Secondary | ICD-10-CM | POA: Diagnosis not present

## 2023-10-26 LAB — CMP (CANCER CENTER ONLY)
ALT: 13 U/L (ref 0–44)
AST: 23 U/L (ref 15–41)
Albumin: 3.5 g/dL (ref 3.5–5.0)
Alkaline Phosphatase: 63 U/L (ref 38–126)
Anion gap: 6 (ref 5–15)
BUN: 23 mg/dL (ref 8–23)
CO2: 29 mmol/L (ref 22–32)
Calcium: 9 mg/dL (ref 8.9–10.3)
Chloride: 99 mmol/L (ref 98–111)
Creatinine: 0.68 mg/dL (ref 0.61–1.24)
GFR, Estimated: >60 mL/min (ref >60)
Glucose, Bld: 117 mg/dL — ABNORMAL HIGH (ref 70–99)
Potassium: 4.4 mmol/L (ref 3.5–5.1)
Sodium: 134 mmol/L — ABNORMAL LOW (ref 135–145)
Total Bilirubin: 0.3 mg/dL (ref 0.0–1.2)
Total Protein: 6.9 g/dL (ref 6.5–8.1)

## 2023-10-26 LAB — CBC WITH DIFFERENTIAL (CANCER CENTER ONLY)
Abs Immature Granulocytes: 0.02 K/uL (ref 0.00–0.07)
Basophils Absolute: 0 K/uL (ref 0.0–0.1)
Basophils Relative: 1 %
Eosinophils Absolute: 0.1 K/uL (ref 0.0–0.5)
Eosinophils Relative: 3 %
HCT: 27 % — ABNORMAL LOW (ref 39.0–52.0)
Hemoglobin: 8.8 g/dL — ABNORMAL LOW (ref 13.0–17.0)
Immature Granulocytes: 1 %
Lymphocytes Relative: 5 %
Lymphs Abs: 0.2 K/uL — ABNORMAL LOW (ref 0.7–4.0)
MCH: 30.4 pg (ref 26.0–34.0)
MCHC: 32.6 g/dL (ref 30.0–36.0)
MCV: 93.4 fL (ref 80.0–100.0)
Monocytes Absolute: 0.7 K/uL (ref 0.1–1.0)
Monocytes Relative: 16 %
Neutro Abs: 3.2 K/uL (ref 1.7–7.7)
Neutrophils Relative %: 74 %
Platelet Count: 224 K/uL (ref 150–400)
RBC: 2.89 MIL/uL — ABNORMAL LOW (ref 4.22–5.81)
RDW: 21.1 % — ABNORMAL HIGH (ref 11.5–15.5)
WBC Count: 4.3 K/uL (ref 4.0–10.5)
nRBC: 0 % (ref 0.0–0.2)

## 2023-10-26 MED ORDER — IOHEXOL 300 MG/ML  SOLN
100.0000 mL | Freq: Once | INTRAMUSCULAR | Status: AC | PRN
  Administered 2023-10-26: 100 mL via INTRAVENOUS

## 2023-10-26 NOTE — Telephone Encounter (Signed)
 Copied from CRM #[id number]. Topic: General - Other >> Oct 26, 2023 11:05 AM [name] wrote: Reason for CRM: pt wife calling in regards to missed call from [place]. Reached out to [facility] they stated Stephen Meza is not available will call pt back.

## 2023-10-26 NOTE — Telephone Encounter (Signed)
 Spoke with patients wife, patient has labs pended from 05/06 he can come by and have drawn

## 2023-11-02 ENCOUNTER — Other Ambulatory Visit: Payer: Self-pay | Admitting: Medical Oncology

## 2023-11-02 ENCOUNTER — Other Ambulatory Visit

## 2023-11-02 ENCOUNTER — Inpatient Hospital Stay: Admitting: Internal Medicine

## 2023-11-02 VITALS — BP 103/64 | HR 106 | Temp 97.3°F | Resp 17 | Ht 72.0 in | Wt 153.4 lb

## 2023-11-02 DIAGNOSIS — E782 Mixed hyperlipidemia: Secondary | ICD-10-CM | POA: Diagnosis not present

## 2023-11-02 DIAGNOSIS — C3431 Malignant neoplasm of lower lobe, right bronchus or lung: Secondary | ICD-10-CM

## 2023-11-02 DIAGNOSIS — M170 Bilateral primary osteoarthritis of knee: Secondary | ICD-10-CM

## 2023-11-02 DIAGNOSIS — I499 Cardiac arrhythmia, unspecified: Secondary | ICD-10-CM

## 2023-11-02 DIAGNOSIS — G621 Alcoholic polyneuropathy: Secondary | ICD-10-CM

## 2023-11-02 DIAGNOSIS — E538 Deficiency of other specified B group vitamins: Secondary | ICD-10-CM | POA: Diagnosis not present

## 2023-11-02 DIAGNOSIS — R269 Unspecified abnormalities of gait and mobility: Secondary | ICD-10-CM | POA: Diagnosis not present

## 2023-11-02 DIAGNOSIS — R7303 Prediabetes: Secondary | ICD-10-CM

## 2023-11-02 DIAGNOSIS — R918 Other nonspecific abnormal finding of lung field: Secondary | ICD-10-CM

## 2023-11-02 DIAGNOSIS — D649 Anemia, unspecified: Secondary | ICD-10-CM | POA: Diagnosis not present

## 2023-11-02 DIAGNOSIS — Z8673 Personal history of transient ischemic attack (TIA), and cerebral infarction without residual deficits: Secondary | ICD-10-CM

## 2023-11-02 DIAGNOSIS — Z79899 Other long term (current) drug therapy: Secondary | ICD-10-CM | POA: Diagnosis not present

## 2023-11-02 DIAGNOSIS — D6481 Anemia due to antineoplastic chemotherapy: Secondary | ICD-10-CM

## 2023-11-02 DIAGNOSIS — J700 Acute pulmonary manifestations due to radiation: Secondary | ICD-10-CM | POA: Diagnosis not present

## 2023-11-02 LAB — CBC WITH DIFFERENTIAL/PLATELET
Basophils Absolute: 0 K/uL (ref 0.0–0.1)
Basophils Relative: 0.6 % (ref 0.0–3.0)
Eosinophils Absolute: 0.1 K/uL (ref 0.0–0.7)
Eosinophils Relative: 2 % (ref 0.0–5.0)
HCT: 28.3 % — ABNORMAL LOW (ref 39.0–52.0)
Hemoglobin: 9.4 g/dL — ABNORMAL LOW (ref 13.0–17.0)
Lymphocytes Relative: 3.9 % — ABNORMAL LOW (ref 12.0–46.0)
Lymphs Abs: 0.2 K/uL — ABNORMAL LOW (ref 0.7–4.0)
MCHC: 33.1 g/dL (ref 30.0–36.0)
MCV: 89.7 fl (ref 78.0–100.0)
Monocytes Absolute: 1 K/uL (ref 0.1–1.0)
Monocytes Relative: 18.1 % — ABNORMAL HIGH (ref 3.0–12.0)
Neutro Abs: 4.1 K/uL (ref 1.4–7.7)
Neutrophils Relative %: 75.4 % (ref 43.0–77.0)
Platelets: 311 K/uL (ref 150.0–400.0)
RBC: 3.16 Mil/uL — ABNORMAL LOW (ref 4.22–5.81)
RDW: 23.1 % — ABNORMAL HIGH (ref 11.5–15.5)
WBC: 5.5 K/uL (ref 4.0–10.5)

## 2023-11-02 LAB — TSH: TSH: 4.73 u[IU]/mL (ref 0.35–5.50)

## 2023-11-02 LAB — COMPREHENSIVE METABOLIC PANEL WITH GFR
ALT: 26 U/L (ref 0–53)
AST: 22 U/L (ref 0–37)
Albumin: 3.6 g/dL (ref 3.5–5.2)
Alkaline Phosphatase: 74 U/L (ref 39–117)
BUN: 17 mg/dL (ref 6–23)
CO2: 26 meq/L (ref 19–32)
Calcium: 9.3 mg/dL (ref 8.4–10.5)
Chloride: 95 meq/L — ABNORMAL LOW (ref 96–112)
Creatinine, Ser: 0.72 mg/dL (ref 0.40–1.50)
GFR: 81.65 mL/min (ref >60.00)
Glucose, Bld: 108 mg/dL — ABNORMAL HIGH (ref 70–99)
Potassium: 3.9 meq/L (ref 3.5–5.1)
Sodium: 130 meq/L — ABNORMAL LOW (ref 135–145)
Total Bilirubin: 0.6 mg/dL (ref 0.2–1.2)
Total Protein: 7.3 g/dL (ref 6.0–8.3)

## 2023-11-02 LAB — LIPID PANEL
Cholesterol: 127 mg/dL (ref 0–200)
HDL: 38.8 mg/dL — ABNORMAL LOW (ref >39.00)
LDL Cholesterol: 69 mg/dL (ref 0–99)
NonHDL: 88.08
Total CHOL/HDL Ratio: 3
Triglycerides: 96 mg/dL (ref 0.0–149.0)
VLDL: 19.2 mg/dL (ref 0.0–40.0)

## 2023-11-02 LAB — HEMOGLOBIN A1C: Hgb A1c MFr Bld: 5.9 % (ref 4.6–6.5)

## 2023-11-02 LAB — VITAMIN B12: Vitamin B-12: 541 pg/mL (ref 211–911)

## 2023-11-02 LAB — MICROALBUMIN / CREATININE URINE RATIO
Creatinine,U: 114.8 mg/dL
Microalb Creat Ratio: 19.5 mg/g (ref 0.0–30.0)
Microalb, Ur: 2.2 mg/dL — ABNORMAL HIGH (ref 0.0–1.9)

## 2023-11-02 NOTE — Patient Instructions (Addendum)
 Dr Sherrod has ordered the following labs to be done at your next appointment  and before every treatment with the immunotherapy , Durvalumab.  Labs to be drawn - CBC with differential, Chemistry 12 panel, TSH (Thyroid  stimulating hormone ), and T4.

## 2023-11-02 NOTE — Progress Notes (Signed)
 Stephen Meza [facility] [phone]   [phone]  OFFICE PROGRESS NOTE  Stephen [name], FNP [address] [place] [address]  DIAGNOSIS: Stage IIIB (T3, N2b, M0) non-small cell lung cancer, squamous cell carcinoma presented with right upper arm lower lobe lung nodules in addition to mediastinal lymphadenopathy diagnosed in June 2025.    PDL1: 0%   PRIOR THERAPY: Concurrent chemoradiation with weekly Carboplatin  for AUC 2 and Paclitaxel  45 mg/m2.  First dose on 08/23/2023.  Status post 6 cycles of treatment.  Last dose was [place] on 09/27/2023.   CURRENT THERAPY: Consolidation treatment with immunotherapy with durvalumab 1500 mg IV every 4 weeks.  First dose 11/09/2023.   INTERVAL HISTORY: Stephen Meza 89 y.o. male returns to the clinic today for follow-up visit accompanied by his wife and daughter Stephen Meza.  His other daughter was available by phone during the visit.Discussed the use of AI scribe software for clinical note transcription with the patient, who gave verbal consent to proceed.  History of Present Illness Stephen Meza is an 89 year old male with stage 3B non-small cell lung cancer who presents for evaluation and discussion of recent CT scan results and treatment options. He is accompanied by his youngest daughter, Stephen Meza, and his other daughter, Stephen Meza, is on the phone.  He was diagnosed with stage 3B non-small cell lung cancer, squamous cell carcinoma, in June 2025 and underwent concurrent chemoradiation with weekly carboplatin  and paclitaxel . A recent CT scan of the chest measured the tumor at 5.6 x 3.0 cm, previously 8.1 x 5.7 cm.  He feels 'okay' but notes a decline in his walking ability since the treatment. No chest pain or hemoptysis, but there is an increase in coughing and sputum production, described as thick but not bloody. His weight is stable at 150 pounds, though he has not regained weight lost  during treatment. His appetite is poor, and he has difficulty chewing due to a dental issue, with a dental appointment scheduled for next week.  He experiences fatigue and has been noted to have anemia, though he does not meet the criteria for a blood transfusion. His liver enzymes, which were previously elevated, are now within normal limits.  His daughters express concern about his quality of life, noting that he is very inactive at home and his walking has worsened since treatment. He also experiences significant fatigue, which may be related to his anemia.    MEDICAL HISTORY: Past Medical History:  Diagnosis Date   Bladder cancer (HCC) 03/19/2016   DUODENITIS, WITHOUT HEMORRHAGE 07/14/2007   ERECTILE DYSFUNCTION 07/14/2007   Esophageal stricture 03/19/2016   ESOPHAGITIS 07/14/2007   EXTERNAL HEMORRHOIDS 07/14/2007   GERD 07/14/2007   Gout    HYPERTENSION 07/14/2007   PERIPHERAL NEUROPATHY 07/14/2007   Peripheral neuropathy 04/06/2019   PERIPHERAL VASCULAR DISEASE 07/14/2007   PODAGRA 07/14/2007   Prostate cancer (HCC) 02/2012   pt had screening and then biopsy   Prostate cancer (HCC) 04/13/2012   Mild rise in PSA lead to prostate biopsy - 1:11 positive for adenocarcinoma. CT abd/pelvis was negative except for enlarged prostate.     Treatment - hormonal     Retinal tear of right eye 03/19/2016   Stroke (HCC) 05/09/2023   TOBACCO ABUSE, HX OF 02/12/2010   TONSILLECTOMY, HX OF 07/14/2007   TREMOR, ESSENTIAL 07/14/2007   VITAMIN B12 DEFICIENCY 07/14/2007    ALLERGIES:  is allergic to ciprofloxacin hcl, statins, and  nitrofuran derivatives.  MEDICATIONS:  Current Outpatient Medications  Medication Sig Dispense Refill   albuterol  (VENTOLIN  HFA) 108 (90 Base) MCG/ACT inhaler Take 1 puff as needed for shortness of breath or wheezing, 8.5 g 2   allopurinol  (ZYLOPRIM ) 100 MG tablet TAKE 1 TABLET BY MOUTH EVERY DAY 90 tablet 4   aspirin  EC 81 MG tablet Take 1 tablet (81 mg total)  by mouth daily. Okay to restart on 07/27/2023.  Swallow whole.     benzonatate  (TESSALON ) 100 MG capsule TAKE 1 CAPSULE (100 MG TOTAL) BY MOUTH EVERY 6 (SIX) HOURS AS NEEDED FOR COUGH 30 capsule 1   carvedilol  (COREG ) 3.125 MG tablet TAKE 1 TABLET BY MOUTH TWICE A DAY WITH A MEAL 180 tablet 1   clopidogrel  (PLAVIX ) 75 MG tablet Take 1 tablet (75 mg total) by mouth daily. 30 tablet 2   Cyanocobalamin  (B-12) 3000 MCG CAPS Take 3,000 mcg by mouth daily.     diclofenac  Sodium (VOLTAREN ) 1 % GEL Apply 4 g topically 4 (four) times daily. 150 g 3   docusate sodium  (COLACE) 100 MG capsule Take 1 capsule (100 mg total) by mouth 2 (two) times daily. 10 capsule 2   ezetimibe  (ZETIA ) 10 MG tablet TAKE 1 TABLET BY MOUTH EVERY DAY 90 tablet 1   lidocaine -prilocaine  (EMLA ) cream Apply 1 Application topically as needed. 30 g 0   methylPREDNISolone  (MEDROL ) 4 MG tablet Take 6 tabs po day 1, 5 tabs po day 2, 4 tabs po day 3, 3 tabs po day 4, 2 tabs po day 5, 1 tab po day 6 then stop 21 tablet 0   omeprazole  (PRILOSEC) 40 MG capsule TAKE 1 CAPSULE BY MOUTH EVERY DAY 90 capsule 2   ondansetron  (ZOFRAN -ODT) 4 MG disintegrating tablet Take 1 tablet (4 mg total) by mouth every 8 (eight) hours as needed for nausea or vomiting. 20 tablet 0   polyethylene glycol (MIRALAX ) 17 g packet Take 17 g by mouth daily as needed for moderate constipation or severe constipation (not helped with stool softner). 10 each 0   sucralfate  (CARAFATE ) 1 g tablet Take 1 tablet (1 g total) by mouth 4 (four) times daily. Dissolve each tablet in 15 cc water before use. 120 tablet 2   tamsulosin  (FLOMAX ) 0.4 MG CAPS capsule Take 0.4 mg by mouth at bedtime.     No current facility-administered medications for this visit.    SURGICAL HISTORY:  Past Surgical History:  Procedure Laterality Date   BRONCHIAL BIOPSY  07/26/2023   Procedure: BRONCHOSCOPY, WITH BIOPSY;  Surgeon: [name], MD;  Location: [facility] ENDOSCOPY;  Service: Pulmonary;;    BRONCHIAL BRUSHINGS  07/26/2023   Procedure: BRONCHOSCOPY, WITH BRUSH BIOPSY;  Surgeon: [name], MD;  Location: [facility] ENDOSCOPY;  Service: Pulmonary;;   Excision of lipoma     IR IMAGING GUIDED PORT INSERTION  08/16/2023   PROSTATE BIOPSY Bilateral 03/10/2012   surgery to reduce turbinate and straighten deviated septum  2016   TONSILLECTOMY     TRANSCAROTID ARTERY REVASCULARIZATION  Right 05/14/2023   Procedure: TRANSCAROTID ARTERY REVASCULARIZATION (TCAR) UISNG 10mm X 40mm ENROUTE TRANSCAROTID STENT SYSTEM;  Surgeon: Lanis Fonda BRAVO, MD;  Location: [facility] OR;  Service: Vascular;  Laterality: Right;   VIDEO BRONCHOSCOPY  07/26/2023   Procedure: VIDEO BRONCHOSCOPY WITHOUT FLUORO;  Surgeon: [name], MD;  Location: [facility] ENDOSCOPY;  Service: Pulmonary;;    REVIEW OF SYSTEMS:  Constitutional: positive for fatigue and weight loss Eyes: negative Ears, nose, mouth, throat,  and face: negative Respiratory: positive for cough Cardiovascular: negative Gastrointestinal: negative Genitourinary:negative Integument/breast: negative Hematologic/lymphatic: negative Musculoskeletal:negative Neurological: negative Behavioral/Psych: negative Endocrine: negative Allergic/Immunologic: negative   PHYSICAL EXAMINATION: General appearance: alert, cooperative, fatigued, and no distress Head: Normocephalic, without obvious abnormality, atraumatic Neck: no adenopathy, no JVD, supple, symmetrical, trachea midline, and thyroid  not enlarged, symmetric, no tenderness/mass/nodules Lymph nodes: Cervical, supraclavicular, and axillary nodes normal. Resp: clear to auscultation bilaterally Back: symmetric, no curvature. ROM normal. No CVA tenderness. Cardio: regular rate and rhythm, S1, S2 normal, no murmur, click, rub or gallop GI: soft, non-tender; bowel sounds normal; no masses,  no organomegaly Extremities: extremities normal, atraumatic, no cyanosis or edema Neurologic: Alert and oriented X 3, normal strength  and tone. Normal symmetric reflexes. Normal coordination and gait  ECOG PERFORMANCE STATUS: 1 - Symptomatic but completely ambulatory  Blood pressure 103/64, pulse (!) 106, temperature (!) 97.3 F (36.3 C), resp. rate 17, height 6' (1.829 m), weight 153 lb 6.4 oz (69.6 kg), SpO2 99%.  LABORATORY DATA: Lab Results  Component Value Date   WBC 4.3 10/26/2023   HGB 8.8 (L) 10/26/2023   HCT 27.0 (L) 10/26/2023   MCV 93.4 10/26/2023   PLT 224 10/26/2023      Chemistry      Component Value Date/Time   NA 134 (L) 10/26/2023 1407   K 4.4 10/26/2023 1407   CL 99 10/26/2023 1407   CO2 29 10/26/2023 1407   BUN 23 10/26/2023 1407   CREATININE 0.68 10/26/2023 1407      Component Value Date/Time   CALCIUM  9.0 10/26/2023 1407   ALKPHOS 63 10/26/2023 1407   AST 23 10/26/2023 1407   ALT 13 10/26/2023 1407   BILITOT 0.3 10/26/2023 1407       RADIOGRAPHIC STUDIES: CT Chest W Contrast Result Date: 10/29/2023 CLINICAL DATA:  Non-small-cell lung cancer staging, metastatic disease evaluation, right upper quadrant abdominal pain * Tracking Code: BO * EXAM: CT CHEST AND ABDOMEN WITH CONTRAST TECHNIQUE: Multidetector CT imaging of the chest and abdomen was performed following the standard protocol during bolus administration of intravenous contrast. RADIATION DOSE REDUCTION: This exam was performed according to the departmental dose-optimization program which includes automated exposure control, adjustment of the mA and/or kV according to patient size and/or use of iterative reconstruction technique. CONTRAST:  OMNIPAQUE  IOHEXOL  300 MG/ML  SOLN COMPARISON:  PET-CT, 08/11/2023 FINDINGS: CT CHEST FINDINGS Cardiovascular: Left chest port catheter. Severe aortic atherosclerosis. Normal heart size. No pericardial effusion. Mediastinum/Nodes: Similar prominence of pretracheal lymph nodes measuring up to 1.2 x 1.0 cm (series 2, image 66). Diminished thickness of treated soft tissue about the right hilum  (series 2, image 67). Thyroid  gland, trachea, and esophagus demonstrate no significant findings. Lungs/Pleura: Diminished size of a large right hilar mass, measuring 5.6 x 3.0 cm, previously 8.1 x 5.7 cm when measured similarly (series 7, image 72). Interval development of extensive ground-glass in the adjacent perihilar right lung. Extensive ground-glass in the more dependent right lung base (series 7, image 102). Mild underlying emphysema and pulmonary fibrosis featuring irregular peripheral interstitial opacity, septal thickening, and areas of subpleural bronchiolectasis as well as probable honeycombing in the lung bases. No pleural effusion or pneumothorax. Musculoskeletal: No chest wall abnormality. No acute osseous findings. Disc degenerative disease and bridging osteophytosis throughout the thoracic spine in keeping with DISH. CT ABDOMEN FINDINGS Hepatobiliary: No solid liver abnormality is seen. No gallstones, gallbladder wall thickening, or biliary dilatation. Pancreas: Unremarkable. No pancreatic ductal dilatation or surrounding inflammatory changes. Spleen:  Normal in size without significant abnormality. Adrenals/Urinary Tract: Unchanged bilateral adrenal nodules, most likely adenomata (series 2, image 16). Kidneys are normal, without renal calculi, solid lesion, or hydronephrosis. Stomach/Bowel: Stomach is within normal limits. Appendix appears normal. No evidence of bowel wall thickening, distention, or inflammatory changes. Vascular/Lymphatic: Aneurysm of the infrarenal abdominal aorta with a large burden of left eccentric mural thrombus measuring up to 4.7 x 4.2 cm (series 2, image 35). No enlarged abdominal lymph nodes. Other: No abdominal wall hernia or abnormality. No ascites. Musculoskeletal: No acute osseous findings. Unchanged wedge deformities of L1 and L2. IMPRESSION: 1. Diminished size of a large right hilar mass and diminished thickness of treated soft tissue about the right hilum. 2.  Interval development of extensive ground-glass in the adjacent perihilar right lung, consistent with developing radiation pneumonitis and fibrosis. 3. Extensive ground-glass in the more dependent right lung base, more suspicious for infection or aspiration given distribution 4. No evidence of lymphadenopathy or metastatic disease in the abdomen. 5. Unchanged bilateral adrenal nodules, most likely adenomata given stability and low-level FDG avidity. Continued attention on follow-up. 6. Aneurysm of the infrarenal abdominal aorta with a large burden of left eccentric mural thrombus measuring up to 4.7 x 4.2 cm. Recommend vascular referral if not already obtained and if clinically appropriate given advanced patient age and comorbidities. 7. Emphysema and UIP pattern pulmonary fibrosis. Aortic Atherosclerosis (ICD10-I70.0) and Emphysema (ICD10-J43.9). Electronically Signed   By: Marolyn JONETTA Jaksch M.D.   On: 10/29/2023 07:48   CT ABDOMEN W CONTRAST Result Date: 10/29/2023 CLINICAL DATA:  Non-small-cell lung cancer staging, metastatic disease evaluation, right upper quadrant abdominal pain * Tracking Code: BO * EXAM: CT CHEST AND ABDOMEN WITH CONTRAST TECHNIQUE: Multidetector CT imaging of the chest and abdomen was performed following the standard protocol during bolus administration of intravenous contrast. RADIATION DOSE REDUCTION: This exam was performed according to the departmental dose-optimization program which includes automated exposure control, adjustment of the mA and/or kV according to patient size and/or use of iterative reconstruction technique. CONTRAST:  OMNIPAQUE  IOHEXOL  300 MG/ML  SOLN COMPARISON:  PET-CT, 08/11/2023 FINDINGS: CT CHEST FINDINGS Cardiovascular: Left chest port catheter. Severe aortic atherosclerosis. Normal heart size. No pericardial effusion. Mediastinum/Nodes: Similar prominence of pretracheal lymph nodes measuring up to 1.2 x 1.0 cm (series 2, image 66). Diminished thickness of  treated soft tissue about the right hilum (series 2, image 67). Thyroid  gland, trachea, and esophagus demonstrate no significant findings. Lungs/Pleura: Diminished size of a large right hilar mass, measuring 5.6 x 3.0 cm, previously 8.1 x 5.7 cm when measured similarly (series 7, image 72). Interval development of extensive ground-glass in the adjacent perihilar right lung. Extensive ground-glass in the more dependent right lung base (series 7, image 102). Mild underlying emphysema and pulmonary fibrosis featuring irregular peripheral interstitial opacity, septal thickening, and areas of subpleural bronchiolectasis as well as probable honeycombing in the lung bases. No pleural effusion or pneumothorax. Musculoskeletal: No chest wall abnormality. No acute osseous findings. Disc degenerative disease and bridging osteophytosis throughout the thoracic spine in keeping with DISH. CT ABDOMEN FINDINGS Hepatobiliary: No solid liver abnormality is seen. No gallstones, gallbladder wall thickening, or biliary dilatation. Pancreas: Unremarkable. No pancreatic ductal dilatation or surrounding inflammatory changes. Spleen: Normal in size without significant abnormality. Adrenals/Urinary Tract: Unchanged bilateral adrenal nodules, most likely adenomata (series 2, image 16). Kidneys are normal, without renal calculi, solid lesion, or hydronephrosis. Stomach/Bowel: Stomach is within normal limits. Appendix appears normal. No evidence of bowel wall thickening, distention, or  inflammatory changes. Vascular/Lymphatic: Aneurysm of the infrarenal abdominal aorta with a large burden of left eccentric mural thrombus measuring up to 4.7 x 4.2 cm (series 2, image 35). No enlarged abdominal lymph nodes. Other: No abdominal wall hernia or abnormality. No ascites. Musculoskeletal: No acute osseous findings. Unchanged wedge deformities of L1 and L2. IMPRESSION: 1. Diminished size of a large right hilar mass and diminished thickness of treated  soft tissue about the right hilum. 2. Interval development of extensive ground-glass in the adjacent perihilar right lung, consistent with developing radiation pneumonitis and fibrosis. 3. Extensive ground-glass in the more dependent right lung base, more suspicious for infection or aspiration given distribution 4. No evidence of lymphadenopathy or metastatic disease in the abdomen. 5. Unchanged bilateral adrenal nodules, most likely adenomata given stability and low-level FDG avidity. Continued attention on follow-up. 6. Aneurysm of the infrarenal abdominal aorta with a large burden of left eccentric mural thrombus measuring up to 4.7 x 4.2 cm. Recommend vascular referral if not already obtained and if clinically appropriate given advanced patient age and comorbidities. 7. Emphysema and UIP pattern pulmonary fibrosis. Aortic Atherosclerosis (ICD10-I70.0) and Emphysema (ICD10-J43.9). Electronically Signed   By: Marolyn JONETTA Jaksch M.D.   On: 10/29/2023 07:48   DG Chest Port 1 View Result Date: 10/06/2023 CLINICAL DATA:  Syncope. EXAM: PORTABLE CHEST 1 VIEW COMPARISON:  Radiograph 06/04/2023, chest CT 08/10/2023 FINDINGS: Lung volumes are low. Right chest port remains in place. Aortic atherosclerosis and tortuosity as before. Right perihilar masslike opacity, increasing surrounding spiculation from prior radiograph. Suspect small right pleural effusion. Underlying chronic lung disease. No pulmonary edema. No pneumothorax. IMPRESSION: 1. Right perihilar masslike opacity, corresponds to known malignancy. 2. Suspect small right pleural effusion. 3. Underlying chronic lung disease. Chronic aortic atherosclerosis and tortuosity. Electronically Signed   By: Andrea Gasman M.D.   On: 10/06/2023 18:49   US  Abdomen Complete Result Date: 10/05/2023 CLINICAL DATA:  Elevated liver function tests, right upper quadrant abdominal pain. EXAM: ABDOMEN ULTRASOUND COMPLETE COMPARISON:  None Available. FINDINGS: Gallbladder: No  gallstones or wall thickening visualized. No sonographic Murphy sign noted by sonographer. Foci of echogenicity seen within gallbladder wall suggesting adenomyomatosis. Common bile duct: Diameter: 5 mm which is within normal limits. Liver: No focal lesion identified. Within normal limits in parenchymal echogenicity. Portal vein is patent on color Doppler imaging with normal direction of blood flow towards the liver. IVC: No abnormality visualized. Pancreas: Not visualized due to overlying bowel gas. Spleen: Size and appearance within normal limits. Right Kidney: Length: 9.7 cm. Echogenicity within normal limits. No mass or hydronephrosis visualized. Left Kidney: Length: 10.4 cm. 1.8 cm cyst is noted. Echogenicity within normal limits. No mass or hydronephrosis visualized. Abdominal aorta: 4.1 cm infrarenal abdominal aortic aneurysm. Other findings: None. IMPRESSION: Pancreas not visualized due to overlying bowel gas. 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 4.1 cm infrarenal abdominal aortic aneurysm. Recommend follow-up CT or MR as appropriate in 12 months and referral to or continued care with vascular specialist. (Ref.: J Vasc Surg. 2018; 67:2-77 and J Am Coll Radiol 2013;10(10):789-794.) Electronically Signed   By: Lynwood Landy Raddle M.D.   On: 10/05/2023 09:14     ASSESSMENT AND PLAN: This is a very pleasant 89 years old white male with Stage IIIB (T3, N2b, M0) non-small cell lung cancer, squamous cell carcinoma presented with right upper arm lower lobe lung nodules in addition to mediastinal lymphadenopathy diagnosed in June 2025.    PDL1: 0% He underwent a course of concurrent chemoradiation with weekly carboplatin   for AUC of 2 and paclitaxel  45 Mg/M2 status post 6 cycles. He had repeat CT scan of the chest, abdomen and pelvis performed recently.  I personally and independently reviewed the scan images and discussed the result and showed the images to the patient and his family. His scan showed partial  response with decrease in the size of the right lower lobe lung mass. 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 Assessment and Plan Assessment & Plan Stage IIIB non-small cell lung cancer, squamous cell carcinoma, post-chemoradiation The lung cancer has responded well to chemoradiation, with tumor size reduced from 8.1 x 5.7 cm to 5.6 x 3.0 cm. No lymphadenopathy or metastatic disease in the abdomen is evident. Standard care involves one year of immunotherapy to prevent regrowth and metastasis, with infusions every four weeks. Risks include fatigue, skin rash, diarrhea, thyroid  issues, and organ inflammation. The decision to proceed with immunotherapy is left to him and his family, considering his age and health status. Alternatively, monitoring with scans every few months is an option. 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  Anemia in the context of malignancy and prior chemoradiation Anemia is likely secondary to malignancy and prior chemoradiation. Hemoglobin levels are above the transfusion threshold of 7 g/dL.  Decreased appetite and weight loss Decreased appetite and weight loss persist post-treatment, though weight has recently stabilized. Appetite is affected by dental issues, with a dental appointment scheduled.  Gait disturbance and decreased mobility Worsening gait disturbance and decreased mobility are likely due to neuropathy and anemia, with increased walking difficulties post-chemoradiation.  Goals of Care The decision regarding immunotherapy is influenced by his age, health status, and personal values. The family considers quality of life  and potential side effects, aware that immunotherapy must start within six weeks of the last chemoradiation session. He may start treatment and discontinue if adverse effects outweigh benefits. The patient was advised to call immediately if he has any concerning symptoms in the interval.  The patient voices understanding of current disease status and treatment options and is in agreement with the current care plan.  All questions were answered. The patient knows to call the clinic with any problems, questions or concerns. We can certainly see the patient much sooner if necessary.  The total time spent in the appointment was 35 minutes including review of chart and various tests results, discussions about plan of care and coordination of care plan .  Disclaimer: This note was dictated with voice recognition software. Similar sounding words can inadvertently be transcribed and may not be corrected upon review.

## 2023-11-02 NOTE — Progress Notes (Signed)
 DISCONTINUE ON PATHWAY REGIMEN - Non-Small Cell Lung     A cycle is every 7 days, concurrent with RT:     Paclitaxel       Carboplatin    **Always confirm dose/schedule in your pharmacy ordering system**  REASON: Continuation Of Treatment PRIOR TREATMENT: [id number]: Carboplatin  AUC=2 + Paclitaxel  45 mg/m2 Weekly During Radiation TREATMENT RESPONSE: Partial Response (PR)  START ON PATHWAY REGIMEN - Non-Small Cell Lung     A cycle is every 28 days:     Durvalumab   **Always confirm dose/schedule in your pharmacy ordering system**  Patient Characteristics: Preoperative or Nonsurgical Candidate (Clinical Staging), Stage IIB (N2a only) or Stage III - Nonsurgical Candidate, PS = 0,1 Therapeutic Status: Preoperative or Nonsurgical Candidate (Clinical Staging) AJCC T Category: cT3 AJCC N Category: cN2b AJCC M Category: cM0 AJCC 9 Stage Grouping: IIIB Check here if patient was staged using an edition other than AJCC Staging 9th Edition: true ECOG Performance Status: 1 Intent of Therapy: Curative Intent, Discussed with Patient

## 2023-11-03 ENCOUNTER — Telehealth: Payer: Self-pay | Admitting: Internal Medicine

## 2023-11-04 ENCOUNTER — Other Ambulatory Visit: Payer: Self-pay

## 2023-11-04 ENCOUNTER — Ambulatory Visit: Payer: Self-pay | Admitting: Family Medicine

## 2023-11-04 NOTE — Progress Notes (Signed)
 Will discuss on follow-up OV, no medication changes needed

## 2023-11-07 DIAGNOSIS — E871 Hypo-osmolality and hyponatremia: Secondary | ICD-10-CM | POA: Insufficient documentation

## 2023-11-07 DIAGNOSIS — D638 Anemia in other chronic diseases classified elsewhere: Secondary | ICD-10-CM | POA: Insufficient documentation

## 2023-11-07 NOTE — Patient Instructions (Signed)
 Continue current medication regimen.  Follow up with oncology as scheduled.   I have ordered labs for your next appointment with me.   Follow up with me in about 3 months for labs and medication management, sooner if needed.

## 2023-11-07 NOTE — Progress Notes (Signed)
 "  Established Patient Office Visit  Subjective:     Patient ID: Stephen Meza, male    DOB: Apr 18, 1933, 89 y.o.   MRN: [id number]  No chief complaint on file.   HPI  Discussed the use of AI scribe software for clinical note transcription with the patient, who gave verbal consent to proceed.  History of Present Illness Stephen Meza is an 89 year old male with non small cell lung cancer and remote stroke who presents with episodes of passing out and difficulty breathing.  Syncope - Four episodes of syncope today, beginning at 9 AM, with additional episodes at 11 AM and later in the day - Appears pale and lacks energy  Dyspnea - Difficulty breathing began this morning and was severe enough to prompt this visit - No chest pain  Cardiac arrhythmia - History of irregular rhythm - Rapid and irregular heart rate on multiple EKGs - No prior cardiology evaluation - Previously on carvedilol   Anemia and fatigue - History of anemia - Recently completed seven weeks of chemotherapy and radiation - Feels exhausted  Cerebrovascular disease - Recent stroke earlier this year following pneumonia - Currently on Plavix  after carotid endarterectomy  Nocturia and lower urinary tract symptoms - Nocturia - Previously on tamsulosin  without relief  Gastrointestinal symptoms - Uses over-the-counter stool softeners and Miralax  for irregular bowel movements - Experienced one episode of nausea     ROS Per HPI      Objective:    There were no vitals taken for this visit.   Physical Exam Vitals and nursing note reviewed.  Constitutional:      General: He is in acute distress.     Appearance: He is ill-appearing.  HENT:     Head: Normocephalic and atraumatic.  Eyes:     Extraocular Movements: Extraocular movements intact.  Cardiovascular:     Rate and Rhythm: Tachycardia present. Rhythm irregular.  Pulmonary:     Effort: Pulmonary effort is normal. No respiratory  distress.     Breath sounds: Rhonchi and rales present. No wheezing.     Comments: Increased work of breathing, even at rest. Cannot speak in complete sentences without gasping Musculoskeletal:     Right lower leg: No edema.     Left lower leg: No edema.     Comments: In w/c for visit, cannot stand on his own today  Lymphadenopathy:     Cervical: No cervical adenopathy.  Skin:    General: Skin is warm and dry.     Coloration: Skin is cyanotic and pale.     Comments: Cyanosis to both hands, nose and lips, the rest of his skin in very pale  Neurological:     Mental Status: He is alert and oriented to person, place, and time.     Motor: Weakness present.  Psychiatric:        Behavior: Behavior normal.     Results for orders placed or performed during the hospital encounter of 11/08/23  Culture, blood (Routine x 2)   Specimen: BLOOD  Result Value Ref Range   Specimen Description BLOOD SITE NOT SPECIFIED    Special Requests      BOTTLES DRAWN AEROBIC AND ANAEROBIC Blood Culture results may not be optimal due to an inadequate volume of blood received in culture bottles   Culture      NO GROWTH 3 DAYS Performed at [facility], [address] [place], [place] [address]    Report Status PENDING  Culture, blood (Routine x 2)   Specimen: BLOOD  Result Value Ref Range   Specimen Description BLOOD SITE NOT SPECIFIED    Special Requests      BOTTLES DRAWN AEROBIC AND ANAEROBIC Blood Culture results may not be optimal due to an inadequate volume of blood received in culture bottles   Culture      NO GROWTH 3 DAYS Performed at [facility], [address], [place], [place] [address]    Report Status PENDING   Resp panel by RT-PCR (RSV, Flu A&B, Covid) Anterior Nasal Swab   Specimen: Anterior Nasal Swab  Result Value Ref Range   SARS Coronavirus 2 by RT PCR NEGATIVE NEGATIVE   Influenza A by PCR NEGATIVE NEGATIVE   Influenza B by PCR NEGATIVE NEGATIVE   Resp Syncytial  Virus by PCR NEGATIVE NEGATIVE  Respiratory (~20 pathogens) panel by PCR   Specimen: Nasopharyngeal Swab; Respiratory  Result Value Ref Range   Adenovirus NOT DETECTED NOT DETECTED   Coronavirus 229E NOT DETECTED NOT DETECTED   Coronavirus HKU1 NOT DETECTED NOT DETECTED   Coronavirus NL63 NOT DETECTED NOT DETECTED   Coronavirus OC43 NOT DETECTED NOT DETECTED   Metapneumovirus NOT DETECTED NOT DETECTED   Rhinovirus / Enterovirus DETECTED (A) NOT DETECTED   Influenza A NOT DETECTED NOT DETECTED   Influenza B NOT DETECTED NOT DETECTED   Parainfluenza Virus 1 NOT DETECTED NOT DETECTED   Parainfluenza Virus 2 NOT DETECTED NOT DETECTED   Parainfluenza Virus 3 NOT DETECTED NOT DETECTED   Parainfluenza Virus 4 NOT DETECTED NOT DETECTED   Respiratory Syncytial Virus NOT DETECTED NOT DETECTED   Bordetella pertussis NOT DETECTED NOT DETECTED   Bordetella Parapertussis NOT DETECTED NOT DETECTED   Chlamydophila pneumoniae NOT DETECTED NOT DETECTED   Mycoplasma pneumoniae NOT DETECTED NOT DETECTED  MRSA Next Gen by PCR, Nasal  Result Value Ref Range   MRSA by PCR Next Gen NOT DETECTED NOT DETECTED  Comprehensive metabolic panel  Result Value Ref Range   Sodium 126 (L) 135 - 145 mmol/L   Potassium 3.8 3.5 - 5.1 mmol/L   Chloride 95 (L) 98 - 111 mmol/L   CO2 19 (L) 22 - 32 mmol/L   Glucose, Bld 141 (H) 70 - 99 mg/dL   BUN 29 (H) 8 - 23 mg/dL   Creatinine, Ser 9.20 0.61 - 1.24 mg/dL   Calcium  8.0 (L) 8.9 - 10.3 mg/dL   Total Protein 5.9 (L) 6.5 - 8.1 g/dL   Albumin  2.3 (L) 3.5 - 5.0 g/dL   AST 36 15 - 41 U/L   ALT 30 0 - 44 U/L   Alkaline Phosphatase 81 38 - 126 U/L   Total Bilirubin 0.5 0.0 - 1.2 mg/dL   GFR, Estimated >39 >39 mL/min   Anion gap 12 5 - 15  CBC with Differential  Result Value Ref Range   WBC 9.0 4.0 - 10.5 K/uL   RBC 2.81 (L) 4.22 - 5.81 MIL/uL   Hemoglobin 8.2 (L) 13.0 - 17.0 g/dL   HCT 72.9 (L) 60.9 - 47.9 %   MCV 96.1 80.0 - 100.0 fL   MCH 29.2 26.0 - 34.0 pg    MCHC 30.4 30.0 - 36.0 g/dL   RDW 80.2 (H) 88.4 - 84.4 %   Platelets 213 150 - 400 K/uL   nRBC 0.0 0.0 - 0.2 %   Neutrophils Relative % 89 %   Neutro Abs 8.0 (H) 1.7 - 7.7 K/uL   Lymphocytes Relative 2 %  Lymphs Abs 0.2 (L) 0.7 - 4.0 K/uL   Monocytes Relative 9 %   Monocytes Absolute 0.8 0.1 - 1.0 K/uL   Eosinophils Relative 0 %   Eosinophils Absolute 0.0 0.0 - 0.5 K/uL   Basophils Relative 0 %   Basophils Absolute 0.0 0.0 - 0.1 K/uL   Immature Granulocytes 0 %   Abs Immature Granulocytes 0.04 0.00 - 0.07 K/uL  Protime-INR  Result Value Ref Range   Prothrombin Time 16.9 (H) 11.4 - 15.2 seconds   INR 1.3 (H) 0.8 - 1.2  Urinalysis, w/ Reflex to Culture (Infection Suspected) -Urine, Clean Catch  Result Value Ref Range   Specimen Source URINE, CATHETERIZED    Color, Urine YELLOW YELLOW   APPearance CLEAR CLEAR   Specific Gravity, Urine 1.023 1.005 - 1.030   pH 6.0 5.0 - 8.0   Glucose, UA NEGATIVE NEGATIVE mg/dL   Hgb urine dipstick NEGATIVE NEGATIVE   Bilirubin Urine NEGATIVE NEGATIVE   Ketones, ur NEGATIVE NEGATIVE mg/dL   Protein, ur NEGATIVE NEGATIVE mg/dL   Nitrite NEGATIVE NEGATIVE   Leukocytes,Ua NEGATIVE NEGATIVE   RBC / HPF 0-5 0 - 5 RBC/hpf   WBC, UA 0-5 0 - 5 WBC/hpf   Bacteria, UA NONE SEEN NONE SEEN   Squamous Epithelial / HPF 0-5 0 - 5 /HPF   Mucus PRESENT    Hyaline Casts, UA PRESENT   Osmolality  Result Value Ref Range   Osmolality 287 275 - 295 mOsm/kg  Basic metabolic panel  Result Value Ref Range   Sodium 131 (L) 135 - 145 mmol/L   Potassium 3.2 (L) 3.5 - 5.1 mmol/L   Chloride 101 98 - 111 mmol/L   CO2 19 (L) 22 - 32 mmol/L   Glucose, Bld 117 (H) 70 - 99 mg/dL   BUN 19 8 - 23 mg/dL   Creatinine, Ser 9.30 0.61 - 1.24 mg/dL   Calcium  7.9 (L) 8.9 - 10.3 mg/dL   GFR, Estimated >39 >39 mL/min   Anion gap 11 5 - 15  Procalcitonin  Result Value Ref Range   Procalcitonin 0.23 ng/mL  Strep pneumoniae urinary antigen  Result Value Ref Range   Strep  Pneumo Urinary Antigen NEGATIVE NEGATIVE  CBC  Result Value Ref Range   WBC 9.9 4.0 - 10.5 K/uL   RBC 3.49 (L) 4.22 - 5.81 MIL/uL   Hemoglobin 10.1 (L) 13.0 - 17.0 g/dL   HCT 67.3 (L) 60.9 - 47.9 %   MCV 93.4 80.0 - 100.0 fL   MCH 28.9 26.0 - 34.0 pg   MCHC 31.0 30.0 - 36.0 g/dL   RDW 79.9 (H) 88.4 - 84.4 %   Platelets 249 150 - 400 K/uL   nRBC 0.0 0.0 - 0.2 %  Magnesium   Result Value Ref Range   Magnesium  2.1 1.7 - 2.4 mg/dL  Phosphorus  Result Value Ref Range   Phosphorus 3.7 2.5 - 4.6 mg/dL  Basic metabolic panel with GFR  Result Value Ref Range   Sodium 132 (L) 135 - 145 mmol/L   Potassium 4.4 3.5 - 5.1 mmol/L   Chloride 100 98 - 111 mmol/L   CO2 22 22 - 32 mmol/L   Glucose, Bld 145 (H) 70 - 99 mg/dL   BUN 20 8 - 23 mg/dL   Creatinine, Ser 9.26 0.61 - 1.24 mg/dL   Calcium  8.7 (L) 8.9 - 10.3 mg/dL   GFR, Estimated >39 >39 mL/min   Anion gap 10 5 - 15  Glucose, capillary  Result  Value Ref Range   Glucose-Capillary 128 (H) 70 - 99 mg/dL  Sedimentation rate  Result Value Ref Range   Sed Rate 81 (H) 0 - 16 mm/hr  Phosphorus  Result Value Ref Range   Phosphorus 3.9 2.5 - 4.6 mg/dL  Renal function panel  Result Value Ref Range   Sodium 131 (L) 135 - 145 mmol/L   Potassium 3.6 3.5 - 5.1 mmol/L   Chloride 102 98 - 111 mmol/L   CO2 19 (L) 22 - 32 mmol/L   Glucose, Bld 116 (H) 70 - 99 mg/dL   BUN 27 (H) 8 - 23 mg/dL   Creatinine, Ser 9.22 0.61 - 1.24 mg/dL   Calcium  8.3 (L) 8.9 - 10.3 mg/dL   Phosphorus 3.8 2.5 - 4.6 mg/dL   Albumin  2.4 (L) 3.5 - 5.0 g/dL   GFR, Estimated >39 >39 mL/min   Anion gap 10 5 - 15  Magnesium   Result Value Ref Range   Magnesium  1.9 1.7 - 2.4 mg/dL  CBC  Result Value Ref Range   WBC 11.2 (H) 4.0 - 10.5 K/uL   RBC 2.82 (L) 4.22 - 5.81 MIL/uL   Hemoglobin 8.3 (L) 13.0 - 17.0 g/dL   HCT 73.9 (L) 60.9 - 47.9 %   MCV 92.2 80.0 - 100.0 fL   MCH 29.4 26.0 - 34.0 pg   MCHC 31.9 30.0 - 36.0 g/dL   RDW 80.2 (H) 88.4 - 84.4 %   Platelets 216  150 - 400 K/uL   nRBC 0.0 0.0 - 0.2 %  C-reactive protein  Result Value Ref Range   CRP 12.1 (H) <1.0 mg/dL  Glucose, capillary  Result Value Ref Range   Glucose-Capillary 130 (H) 70 - 99 mg/dL  Glucose, capillary  Result Value Ref Range   Glucose-Capillary 113 (H) 70 - 99 mg/dL  Blood gas, arterial  Result Value Ref Range   pH, Arterial 7.46 (H) 7.35 - 7.45   pCO2 arterial 31 (L) 32 - 48 mmHg   pO2, Arterial 170 (H) 83 - 108 mmHg   Bicarbonate 22.0 20.0 - 28.0 mmol/L   Acid-base deficit 0.9 0.0 - 2.0 mmol/L   O2 Saturation 99.1 %   Patient temperature 36.9    Collection site LEFT RADIAL    Drawn by CELINE RAMOS,RT    Allens test (pass/fail) PASS PASS  Glucose, capillary  Result Value Ref Range   Glucose-Capillary 120 (H) 70 - 99 mg/dL  Glucose, capillary  Result Value Ref Range   Glucose-Capillary 142 (H) 70 - 99 mg/dL  Glucose, capillary  Result Value Ref Range   Glucose-Capillary 130 (H) 70 - 99 mg/dL  Glucose, capillary  Result Value Ref Range   Glucose-Capillary 139 (H) 70 - 99 mg/dL  CBC  Result Value Ref Range   WBC 6.8 4.0 - 10.5 K/uL   RBC 2.84 (L) 4.22 - 5.81 MIL/uL   Hemoglobin 8.3 (L) 13.0 - 17.0 g/dL   HCT 73.5 (L) 60.9 - 47.9 %   MCV 93.0 80.0 - 100.0 fL   MCH 29.2 26.0 - 34.0 pg   MCHC 31.4 30.0 - 36.0 g/dL   RDW 80.0 (H) 88.4 - 84.4 %   Platelets 200 150 - 400 K/uL   nRBC 0.0 0.0 - 0.2 %  Comprehensive metabolic panel with GFR  Result Value Ref Range   Sodium 135 135 - 145 mmol/L   Potassium 3.8 3.5 - 5.1 mmol/L   Chloride 100 98 - 111 mmol/L   CO2 22  22 - 32 mmol/L   Glucose, Bld 123 (H) 70 - 99 mg/dL   BUN 23 8 - 23 mg/dL   Creatinine, Ser 9.31 0.61 - 1.24 mg/dL   Calcium  8.5 (L) 8.9 - 10.3 mg/dL   Total Protein 5.7 (L) 6.5 - 8.1 g/dL   Albumin  2.5 (L) 3.5 - 5.0 g/dL   AST 45 (H) 15 - 41 U/L   ALT 42 0 - 44 U/L   Alkaline Phosphatase 82 38 - 126 U/L   Total Bilirubin 0.6 0.0 - 1.2 mg/dL   GFR, Estimated >39 >39 mL/min   Anion gap 13  5 - 15  C-reactive protein  Result Value Ref Range   CRP 7.6 (H) <1.0 mg/dL  Glucose, capillary  Result Value Ref Range   Glucose-Capillary 142 (H) 70 - 99 mg/dL  Glucose, capillary  Result Value Ref Range   Glucose-Capillary 161 (H) 70 - 99 mg/dL  Glucose, capillary  Result Value Ref Range   Glucose-Capillary 128 (H) 70 - 99 mg/dL  Glucose, capillary  Result Value Ref Range   Glucose-Capillary 119 (H) 70 - 99 mg/dL  Glucose, capillary  Result Value Ref Range   Glucose-Capillary 122 (H) 70 - 99 mg/dL  I-Stat Lactic Acid, ED  Result Value Ref Range   Lactic Acid, Venous 2.9 (HH) 0.5 - 1.9 mmol/L   Comment NOTIFIED PHYSICIAN   I-Stat Lactic Acid, ED  Result Value Ref Range   Lactic Acid, Venous 1.2 0.5 - 1.9 mmol/L  CBG monitoring, ED  Result Value Ref Range   Glucose-Capillary 126 (H) 70 - 99 mg/dL  CBG monitoring, ED  Result Value Ref Range   Glucose-Capillary 136 (H) 70 - 99 mg/dL  CBG monitoring, ED  Result Value Ref Range   Glucose-Capillary 146 (H) 70 - 99 mg/dL  ECHOCARDIOGRAM COMPLETE  Result Value Ref Range   Weight 2,433.88 oz   Height 72 in   BP 114/78 mmHg   Single Plane A4C EF 40.7 %   S' Lateral 3.00 cm   Est EF 55 - 60%   Troponin I (High Sensitivity)  Result Value Ref Range   Troponin I (High Sensitivity) 98 (H) <18 ng/L  Troponin I (High Sensitivity)  Result Value Ref Range   Troponin I (High Sensitivity) 102 (HH) <18 ng/L  Troponin I (High Sensitivity)  Result Value Ref Range   Troponin I (High Sensitivity) 99 (H) <18 ng/L      BP Readings from Last 3 Encounters:  11/11/23 (!) 144/88  11/02/23 103/64  10/13/23 102/67   Wt Readings from Last 3 Encounters:  11/08/23 152 lb 1.9 oz (69 kg)  11/02/23 153 lb 6.4 oz (69.6 kg)  10/13/23 154 lb 6.4 oz (70 kg)      Last CBC Lab Results  Component Value Date   WBC 6.8 11/11/2023   HGB 8.3 (L) 11/11/2023   HCT 26.4 (L) 11/11/2023   MCV 93.0 11/11/2023   MCH 29.2 11/11/2023   RDW 19.9 (H)  11/11/2023   PLT 200 11/11/2023   Last metabolic panel Lab Results  Component Value Date   GLUCOSE 123 (H) 11/11/2023   NA 135 11/11/2023   K 3.8 11/11/2023   CL 100 11/11/2023   CO2 22 11/11/2023   BUN 23 11/11/2023   CREATININE 0.68 11/11/2023   GFR 81.65 11/02/2023   CALCIUM  8.5 (L) 11/11/2023   PHOS 3.9 11/10/2023   PHOS 3.8 11/10/2023   PROT 5.7 (L) 11/11/2023   ALBUMIN  2.5 (L)  11/11/2023   BILITOT 0.6 11/11/2023   ALKPHOS 82 11/11/2023   AST 45 (H) 11/11/2023   ALT 42 11/11/2023   ANIONGAP 13 11/11/2023   Last lipids Lab Results  Component Value Date   CHOL 127 11/02/2023   HDL 38.80 (L) 11/02/2023   LDLCALC 69 11/02/2023   LDLDIRECT 93.0 03/15/2014   TRIG 96.0 11/02/2023   CHOLHDL 3 11/02/2023   Last hemoglobin A1c Lab Results  Component Value Date   HGBA1C 5.9 11/02/2023   Last thyroid  functions Lab Results  Component Value Date   TSH 4.73 11/02/2023   Last vitamin D  Lab Results  Component Value Date   VD25OH 61.85 11/06/2022   Last vitamin B12 and Folate Lab Results  Component Value Date   VITAMINB12 541 11/02/2023   FOLATE 14.8 05/10/2023         Assessment & Plan:   Assessment and Plan Assessment & Plan Cardiac arrhythmia with syncope and collapse Irregular heart rhythm with syncope, prolonged PR interval, and left anterior fascicular block. Potential exacerbation by hypotension and anemia. Increased risk with Zofran . - Transfer to hospital for cardiac evaluation and management. - Avoid Zofran  due to risk of arrhythmia.  Dyspnea/SOB Severe dyspnea possibly due to anemia, cardiac arrhythmia, or lung pathology. Oxygen saturation low. - Transfer to hospital for evaluation of dyspnea.  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 Recent chemotherapy completed. Anemia contributing to dyspnea and syncope. Consider iron  or blood transfusion. - Transfer to hospital for  potential iron  or blood transfusion.  Hyponatremia - Could be related to recent antineoplastic treatment, dehydration - Could be affecting cardiac rhythm  Port A Cath in Place - stable  Gait Disorder, Generalized Weakness, Weight loss Syncope and collapse affecting mobility. Weakness likely due to anemia, cardiac issues, and cancer treatment. - Transfer to hospital for comprehensive evaluation.  Essential hypertension Blood pressure low, possibly due to treatments and health status. Carvedilol  previously used but may exacerbate current issues. - Evaluate blood pressure management at the hospital.  To ER via EMS   No orders of the defined types were placed in this encounter.    No orders of the defined types were placed in this encounter.   Return for to the ER with EMS.  Corean LITTIE Ku, FNP   "

## 2023-11-08 ENCOUNTER — Ambulatory Visit: Admitting: Family Medicine

## 2023-11-08 ENCOUNTER — Other Ambulatory Visit: Payer: Self-pay

## 2023-11-08 ENCOUNTER — Encounter: Payer: Self-pay | Admitting: Family Medicine

## 2023-11-08 ENCOUNTER — Emergency Department (HOSPITAL_COMMUNITY)

## 2023-11-08 ENCOUNTER — Ambulatory Visit
Admission: RE | Admit: 2023-11-08 | Discharge: 2023-11-08 | Disposition: A | Discharge status: Home / Self Care | Source: Ambulatory Visit | Attending: Radiation Oncology | Admitting: Radiation Oncology

## 2023-11-08 ENCOUNTER — Inpatient Hospital Stay (HOSPITAL_COMMUNITY)
Admission: EM | Admit: 2023-11-08 | Discharge: 2023-11-24 | DRG: 193 | Disposition: E | Source: Ambulatory Visit | Attending: Internal Medicine | Admitting: Internal Medicine

## 2023-11-08 VITALS — BP 100/60 | HR 124 | Wt 153.0 lb

## 2023-11-08 DIAGNOSIS — R269 Unspecified abnormalities of gait and mobility: Secondary | ICD-10-CM

## 2023-11-08 DIAGNOSIS — M109 Gout, unspecified: Secondary | ICD-10-CM | POA: Diagnosis not present

## 2023-11-08 DIAGNOSIS — Z87891 Personal history of nicotine dependence: Secondary | ICD-10-CM

## 2023-11-08 DIAGNOSIS — Z79899 Other long term (current) drug therapy: Secondary | ICD-10-CM

## 2023-11-08 DIAGNOSIS — E878 Other disorders of electrolyte and fluid balance, not elsewhere classified: Secondary | ICD-10-CM | POA: Diagnosis present

## 2023-11-08 DIAGNOSIS — R404 Transient alteration of awareness: Secondary | ICD-10-CM | POA: Diagnosis not present

## 2023-11-08 DIAGNOSIS — C3431 Malignant neoplasm of lower lobe, right bronchus or lung: Secondary | ICD-10-CM | POA: Insufficient documentation

## 2023-11-08 DIAGNOSIS — R531 Weakness: Secondary | ICD-10-CM

## 2023-11-08 DIAGNOSIS — E278 Other specified disorders of adrenal gland: Secondary | ICD-10-CM | POA: Diagnosis present

## 2023-11-08 DIAGNOSIS — Z95828 Presence of other vascular implants and grafts: Secondary | ICD-10-CM

## 2023-11-08 DIAGNOSIS — J80 Acute respiratory distress syndrome: Secondary | ICD-10-CM | POA: Diagnosis present

## 2023-11-08 DIAGNOSIS — D638 Anemia in other chronic diseases classified elsewhere: Secondary | ICD-10-CM

## 2023-11-08 DIAGNOSIS — Y842 Radiological procedure and radiotherapy as the cause of abnormal reaction of the patient, or of later complication, without mention of misadventure at the time of the procedure: Secondary | ICD-10-CM | POA: Diagnosis present

## 2023-11-08 DIAGNOSIS — R634 Abnormal weight loss: Secondary | ICD-10-CM

## 2023-11-08 DIAGNOSIS — Z7902 Long term (current) use of antithrombotics/antiplatelets: Secondary | ICD-10-CM

## 2023-11-08 DIAGNOSIS — E785 Hyperlipidemia, unspecified: Secondary | ICD-10-CM | POA: Diagnosis present

## 2023-11-08 DIAGNOSIS — E871 Hypo-osmolality and hyponatremia: Secondary | ICD-10-CM | POA: Diagnosis not present

## 2023-11-08 DIAGNOSIS — I10 Essential (primary) hypertension: Secondary | ICD-10-CM | POA: Diagnosis not present

## 2023-11-08 DIAGNOSIS — J700 Acute pulmonary manifestations due to radiation: Secondary | ICD-10-CM | POA: Diagnosis not present

## 2023-11-08 DIAGNOSIS — I82812 Embolism and thrombosis of superficial veins of left lower extremities: Secondary | ICD-10-CM | POA: Diagnosis present

## 2023-11-08 DIAGNOSIS — Z8546 Personal history of malignant neoplasm of prostate: Secondary | ICD-10-CM

## 2023-11-08 DIAGNOSIS — I739 Peripheral vascular disease, unspecified: Secondary | ICD-10-CM | POA: Diagnosis present

## 2023-11-08 DIAGNOSIS — C3491 Malignant neoplasm of unspecified part of right bronchus or lung: Secondary | ICD-10-CM | POA: Diagnosis not present

## 2023-11-08 DIAGNOSIS — Z923 Personal history of irradiation: Secondary | ICD-10-CM

## 2023-11-08 DIAGNOSIS — Z789 Other specified health status: Secondary | ICD-10-CM | POA: Diagnosis not present

## 2023-11-08 DIAGNOSIS — I7121 Aneurysm of the ascending aorta, without rupture: Secondary | ICD-10-CM | POA: Diagnosis not present

## 2023-11-08 DIAGNOSIS — N400 Enlarged prostate without lower urinary tract symptoms: Secondary | ICD-10-CM | POA: Diagnosis present

## 2023-11-08 DIAGNOSIS — G720 Drug-induced myopathy: Secondary | ICD-10-CM

## 2023-11-08 DIAGNOSIS — B9789 Other viral agents as the cause of diseases classified elsewhere: Secondary | ICD-10-CM | POA: Diagnosis not present

## 2023-11-08 DIAGNOSIS — R0602 Shortness of breath: Secondary | ICD-10-CM | POA: Diagnosis not present

## 2023-11-08 DIAGNOSIS — E782 Mixed hyperlipidemia: Secondary | ICD-10-CM

## 2023-11-08 DIAGNOSIS — R42 Dizziness and giddiness: Secondary | ICD-10-CM | POA: Diagnosis not present

## 2023-11-08 DIAGNOSIS — J470 Bronchiectasis with acute lower respiratory infection: Secondary | ICD-10-CM | POA: Diagnosis not present

## 2023-11-08 DIAGNOSIS — I959 Hypotension, unspecified: Secondary | ICD-10-CM | POA: Diagnosis present

## 2023-11-08 DIAGNOSIS — J479 Bronchiectasis, uncomplicated: Secondary | ICD-10-CM | POA: Diagnosis not present

## 2023-11-08 DIAGNOSIS — R54 Age-related physical debility: Secondary | ICD-10-CM | POA: Diagnosis present

## 2023-11-08 DIAGNOSIS — D630 Anemia in neoplastic disease: Secondary | ICD-10-CM | POA: Diagnosis not present

## 2023-11-08 DIAGNOSIS — Z515 Encounter for palliative care: Secondary | ICD-10-CM

## 2023-11-08 DIAGNOSIS — R339 Retention of urine, unspecified: Secondary | ICD-10-CM | POA: Diagnosis not present

## 2023-11-08 DIAGNOSIS — R042 Hemoptysis: Secondary | ICD-10-CM | POA: Diagnosis not present

## 2023-11-08 DIAGNOSIS — R509 Fever, unspecified: Secondary | ICD-10-CM | POA: Diagnosis present

## 2023-11-08 DIAGNOSIS — Z743 Need for continuous supervision: Secondary | ICD-10-CM | POA: Diagnosis not present

## 2023-11-08 DIAGNOSIS — E872 Acidosis, unspecified: Secondary | ICD-10-CM | POA: Diagnosis present

## 2023-11-08 DIAGNOSIS — Z881 Allergy status to other antibiotic agents status: Secondary | ICD-10-CM

## 2023-11-08 DIAGNOSIS — I444 Left anterior fascicular block: Secondary | ICD-10-CM | POA: Diagnosis present

## 2023-11-08 DIAGNOSIS — E876 Hypokalemia: Secondary | ICD-10-CM | POA: Diagnosis not present

## 2023-11-08 DIAGNOSIS — Z711 Person with feared health complaint in whom no diagnosis is made: Secondary | ICD-10-CM | POA: Diagnosis not present

## 2023-11-08 DIAGNOSIS — Z7982 Long term (current) use of aspirin: Secondary | ICD-10-CM

## 2023-11-08 DIAGNOSIS — S2241XA Multiple fractures of ribs, right side, initial encounter for closed fracture: Secondary | ICD-10-CM | POA: Diagnosis not present

## 2023-11-08 DIAGNOSIS — G8929 Other chronic pain: Secondary | ICD-10-CM

## 2023-11-08 DIAGNOSIS — E860 Dehydration: Secondary | ICD-10-CM | POA: Diagnosis not present

## 2023-11-08 DIAGNOSIS — K219 Gastro-esophageal reflux disease without esophagitis: Secondary | ICD-10-CM | POA: Diagnosis present

## 2023-11-08 DIAGNOSIS — R7989 Other specified abnormal findings of blood chemistry: Secondary | ICD-10-CM | POA: Diagnosis not present

## 2023-11-08 DIAGNOSIS — J90 Pleural effusion, not elsewhere classified: Secondary | ICD-10-CM | POA: Diagnosis not present

## 2023-11-08 DIAGNOSIS — J1289 Other viral pneumonia: Principal | ICD-10-CM | POA: Diagnosis present

## 2023-11-08 DIAGNOSIS — I498 Other specified cardiac arrhythmias: Secondary | ICD-10-CM

## 2023-11-08 DIAGNOSIS — M7989 Other specified soft tissue disorders: Secondary | ICD-10-CM | POA: Diagnosis not present

## 2023-11-08 DIAGNOSIS — I82443 Acute embolism and thrombosis of tibial vein, bilateral: Secondary | ICD-10-CM | POA: Diagnosis present

## 2023-11-08 DIAGNOSIS — Z803 Family history of malignant neoplasm of breast: Secondary | ICD-10-CM

## 2023-11-08 DIAGNOSIS — I480 Paroxysmal atrial fibrillation: Secondary | ICD-10-CM | POA: Diagnosis present

## 2023-11-08 DIAGNOSIS — B348 Other viral infections of unspecified site: Secondary | ICD-10-CM | POA: Diagnosis not present

## 2023-11-08 DIAGNOSIS — I21A1 Myocardial infarction type 2: Secondary | ICD-10-CM | POA: Diagnosis present

## 2023-11-08 DIAGNOSIS — Z1152 Encounter for screening for COVID-19: Secondary | ICD-10-CM

## 2023-11-08 DIAGNOSIS — I82451 Acute embolism and thrombosis of right peroneal vein: Secondary | ICD-10-CM | POA: Diagnosis present

## 2023-11-08 DIAGNOSIS — J9601 Acute respiratory failure with hypoxia: Secondary | ICD-10-CM | POA: Diagnosis not present

## 2023-11-08 DIAGNOSIS — Z8551 Personal history of malignant neoplasm of bladder: Secondary | ICD-10-CM

## 2023-11-08 DIAGNOSIS — C3490 Malignant neoplasm of unspecified part of unspecified bronchus or lung: Secondary | ICD-10-CM | POA: Diagnosis not present

## 2023-11-08 DIAGNOSIS — Z7189 Other specified counseling: Secondary | ICD-10-CM | POA: Diagnosis not present

## 2023-11-08 DIAGNOSIS — R55 Syncope and collapse: Secondary | ICD-10-CM

## 2023-11-08 DIAGNOSIS — R918 Other nonspecific abnormal finding of lung field: Secondary | ICD-10-CM | POA: Diagnosis not present

## 2023-11-08 DIAGNOSIS — Z8673 Personal history of transient ischemic attack (TIA), and cerebral infarction without residual deficits: Secondary | ICD-10-CM

## 2023-11-08 DIAGNOSIS — Z9221 Personal history of antineoplastic chemotherapy: Secondary | ICD-10-CM

## 2023-11-08 DIAGNOSIS — Z66 Do not resuscitate: Secondary | ICD-10-CM | POA: Diagnosis present

## 2023-11-08 DIAGNOSIS — R000 Tachycardia, unspecified: Secondary | ICD-10-CM | POA: Diagnosis not present

## 2023-11-08 DIAGNOSIS — Z888 Allergy status to other drugs, medicaments and biological substances status: Secondary | ICD-10-CM

## 2023-11-08 DIAGNOSIS — J984 Other disorders of lung: Secondary | ICD-10-CM | POA: Diagnosis not present

## 2023-11-08 DIAGNOSIS — L97511 Non-pressure chronic ulcer of other part of right foot limited to breakdown of skin: Secondary | ICD-10-CM | POA: Insufficient documentation

## 2023-11-08 DIAGNOSIS — N2889 Other specified disorders of kidney and ureter: Secondary | ICD-10-CM | POA: Diagnosis present

## 2023-11-08 HISTORY — DX: Headache, unspecified: R51.9

## 2023-11-08 LAB — CBC WITH DIFFERENTIAL/PLATELET
Abs Immature Granulocytes: 0.04 K/uL (ref 0.00–0.07)
Basophils Absolute: 0 K/uL (ref 0.0–0.1)
Basophils Relative: 0 %
Eosinophils Absolute: 0 K/uL (ref 0.0–0.5)
Eosinophils Relative: 0 %
HCT: 27 % — ABNORMAL LOW (ref 39.0–52.0)
Hemoglobin: 8.2 g/dL — ABNORMAL LOW (ref 13.0–17.0)
Immature Granulocytes: 0 %
Lymphocytes Relative: 2 %
Lymphs Abs: 0.2 K/uL — ABNORMAL LOW (ref 0.7–4.0)
MCH: 29.2 pg (ref 26.0–34.0)
MCHC: 30.4 g/dL (ref 30.0–36.0)
MCV: 96.1 fL (ref 80.0–100.0)
Monocytes Absolute: 0.8 K/uL (ref 0.1–1.0)
Monocytes Relative: 9 %
Neutro Abs: 8 K/uL — ABNORMAL HIGH (ref 1.7–7.7)
Neutrophils Relative %: 89 %
Platelets: 213 K/uL (ref 150–400)
RBC: 2.81 MIL/uL — ABNORMAL LOW (ref 4.22–5.81)
RDW: 19.7 % — ABNORMAL HIGH (ref 11.5–15.5)
WBC: 9 K/uL (ref 4.0–10.5)
nRBC: 0 % (ref 0.0–0.2)

## 2023-11-08 LAB — COMPREHENSIVE METABOLIC PANEL WITH GFR
ALT: 30 U/L (ref 0–44)
AST: 36 U/L (ref 15–41)
Albumin: 2.3 g/dL — ABNORMAL LOW (ref 3.5–5.0)
Alkaline Phosphatase: 81 U/L (ref 38–126)
Anion gap: 12 (ref 5–15)
BUN: 29 mg/dL — ABNORMAL HIGH (ref 8–23)
CO2: 19 mmol/L — ABNORMAL LOW (ref 22–32)
Calcium: 8 mg/dL — ABNORMAL LOW (ref 8.9–10.3)
Chloride: 95 mmol/L — ABNORMAL LOW (ref 98–111)
Creatinine, Ser: 0.79 mg/dL (ref 0.61–1.24)
GFR, Estimated: >60 mL/min (ref >60)
Glucose, Bld: 141 mg/dL — ABNORMAL HIGH (ref 70–99)
Potassium: 3.8 mmol/L (ref 3.5–5.1)
Sodium: 126 mmol/L — ABNORMAL LOW (ref 135–145)
Total Bilirubin: 0.5 mg/dL (ref 0.0–1.2)
Total Protein: 5.9 g/dL — ABNORMAL LOW (ref 6.5–8.1)

## 2023-11-08 LAB — URINALYSIS, W/ REFLEX TO CULTURE (INFECTION SUSPECTED)
Bacteria, UA: NONE SEEN
Bilirubin Urine: NEGATIVE
Glucose, UA: NEGATIVE mg/dL
Hgb urine dipstick: NEGATIVE
Ketones, ur: NEGATIVE mg/dL
Leukocytes,Ua: NEGATIVE
Nitrite: NEGATIVE
Protein, ur: NEGATIVE mg/dL
Specific Gravity, Urine: 1.023 (ref 1.005–1.030)
pH: 6 (ref 5.0–8.0)

## 2023-11-08 LAB — I-STAT CG4 LACTIC ACID, ED
Lactic Acid, Venous: 1.2 mmol/L (ref 0.5–1.9)
Lactic Acid, Venous: 2.9 mmol/L (ref 0.5–1.9)

## 2023-11-08 LAB — TROPONIN I (HIGH SENSITIVITY): Troponin I (High Sensitivity): 98 ng/L — ABNORMAL HIGH (ref <18)

## 2023-11-08 LAB — PROTIME-INR
INR: 1.3 — ABNORMAL HIGH (ref 0.8–1.2)
Prothrombin Time: 16.9 s — ABNORMAL HIGH (ref 11.4–15.2)

## 2023-11-08 MED ORDER — SODIUM CHLORIDE 0.9 % IV BOLUS
1000.0000 mL | Freq: Once | INTRAVENOUS | Status: AC
  Administered 2023-11-08: 1000 mL via INTRAVENOUS

## 2023-11-08 MED ORDER — PREDNISONE 20 MG PO TABS
60.0000 mg | ORAL_TABLET | Freq: Once | ORAL | Status: AC
  Administered 2023-11-09: 60 mg via ORAL
  Filled 2023-11-08: qty 3

## 2023-11-08 MED ORDER — IOHEXOL 350 MG/ML SOLN
75.0000 mL | Freq: Once | INTRAVENOUS | Status: AC | PRN
  Administered 2023-11-08: 75 mL via INTRAVENOUS

## 2023-11-08 NOTE — ED Provider Notes (Signed)
 [place] EMERGENCY DEPARTMENT AT [facility] Provider Note HPI Stephen Meza is a 89 y.o. male with a PMH of recently diagnosed lung cancer on CRT (last in August), HTN, gout, prior CVA on ASA and Plavix , B12 deficiency with peripheral neuropathy below who presents to the emergency department with generalized weakness, shortness of breath since Friday along with a syncopal episodes starting this morning.  Patient does not wear oxygen at home.  No chest pain or fevers.  He has been having a cough with some streaks of blood in his sputum.  Patient had a syncopal event in his wheelchair and did not hit his head or sustain injuries  Past Medical History:  Diagnosis Date   Bladder cancer (HCC) 03/19/2016   DUODENITIS, WITHOUT HEMORRHAGE 07/14/2007   ERECTILE DYSFUNCTION 07/14/2007   Esophageal stricture 03/19/2016   ESOPHAGITIS 07/14/2007   EXTERNAL HEMORRHOIDS 07/14/2007   GERD 07/14/2007   Gout    HYPERTENSION 07/14/2007   PERIPHERAL NEUROPATHY 07/14/2007   Peripheral neuropathy 04/06/2019   PERIPHERAL VASCULAR DISEASE 07/14/2007   PODAGRA 07/14/2007   Prostate cancer (HCC) 02/2012   pt had screening and then biopsy   Prostate cancer (HCC) 04/13/2012   Mild rise in PSA lead to prostate biopsy - 1:11 positive for adenocarcinoma. CT abd/pelvis was negative except for enlarged prostate.     Treatment - hormonal     Retinal tear of right eye 03/19/2016   Stroke (HCC) 05/09/2023   TOBACCO ABUSE, HX OF 02/12/2010   TONSILLECTOMY, HX OF 07/14/2007   TREMOR, ESSENTIAL 07/14/2007   VITAMIN B12 DEFICIENCY 07/14/2007   Past Surgical History:  Procedure Laterality Date   BRONCHIAL BIOPSY  07/26/2023   Procedure: BRONCHOSCOPY, WITH BIOPSY;  Surgeon: [name], MD;  Location: [facility] ENDOSCOPY;  Service: Pulmonary;;   BRONCHIAL BRUSHINGS  07/26/2023   Procedure: BRONCHOSCOPY, WITH BRUSH BIOPSY;  Surgeon: [name], MD;  Location: [facility] ENDOSCOPY;  Service: Pulmonary;;   Excision  of lipoma     IR IMAGING GUIDED PORT INSERTION  08/16/2023   PROSTATE BIOPSY Bilateral 03/10/2012   surgery to reduce turbinate and straighten deviated septum  2016   TONSILLECTOMY     TRANSCAROTID ARTERY REVASCULARIZATION  Right 05/14/2023   Procedure: TRANSCAROTID ARTERY REVASCULARIZATION (TCAR) UISNG 10mm X 40mm ENROUTE TRANSCAROTID STENT SYSTEM;  Surgeon: Lanis Fonda BRAVO, MD;  Location: [facility] OR;  Service: Vascular;  Laterality: Right;   VIDEO BRONCHOSCOPY  07/26/2023   Procedure: VIDEO BRONCHOSCOPY WITHOUT FLUORO;  Surgeon: [name], MD;  Location: [facility] ENDOSCOPY;  Service: Pulmonary;;    Review of Systems Pertinent positives and negative findings are listed as part of the History of Present Illness and MDM  Physical Exam Vitals:   11/08/23 1709 11/08/23 1710 11/08/23 1800 11/08/23 1900  BP: 126/70  119/77 122/72  Pulse: (!) 105  94 92  Resp: (!) 21  (!) 22 18  Temp: 98.5 F (36.9 C)     TempSrc: Oral     SpO2: 92%  90% 94%  Weight:  69 kg    Height:  6' (1.829 m)       Constitutional Nursing notes reviewed Vital signs reviewed  HEENT No obvious trauma Pupils round, equal, and reactive to light. Pupils cross midline Neck supple  Respiratory Mild tachypnea Hypoxia requiring nasal cannula Lungs clear to auscultation bilaterally  CV Tachycardia with regular rhythm No pitting edema  Abdomen Soft, non-tender, non-distended No peritonitis  MSK Atraumatic No obvious deformity ROM appropriate  Neuro Awake  and alert Pupils cross midline Moving all extremities    MDM:  Initial Differential Diagnoses includes arrhythmia, conduction block, ACS, pulmonary embolism, pneumonia, pneumothorax, progression of lung cancer  I reviewed the patient's vitals, the nursing triage note and evaluated the patient at bedside.   I reviewed the patient's external records which show that the patient has newly diagnosed lung cancer and is s/p CRT in August.  He is supposed to start  immunotherapy tomorrow.  He is coming in from family medicine clinic for a syncopal event along with shortness of breath and hypoxia.  Patient has been feeling progressively weaker over the last week.  He started noticing shortness of breath on Friday with streaks of blood when coughing.  He is not on any blood thinners but does take aspirin  and Plavix  for prior CVA.  I considered ACS and reviewed/interpreted the EKG. EKG shows sinus tachycardia with no evidence of acute ischemic changes or high grade conduction block. There is no STEMI or contiguous T wave inversions. No evidence of new-onset arrythmia. The PR, QRS, and QT intervals are normal.  Initial troponin elevated to 98.  Likely demand ischemia in the absence of acute ischemic changes on EKG and absence of chest pain.  Labs interpreted by myself show concern for dehydration.  Patient has hyponatremia with sodium of 126, hypochloremia with chloride of 95, metabolic acidosis with bicarb of 19.  He was given a bolus of normal saline.  Remainder of the labs show hemoglobin of 8.2 which is just below the patient's baseline of around 9.  No leukocytosis.  CT PE shows interval development of multilobar ground glass opacities and interlobular septal thickening which may represent pulmonary fat embolism, ARDS, postradiation pneumonitis.  There are some filling defects and subsegmental pulmonary arteries that may suggest artifact or tiny pulmonary emboli.  Discussed with Dr. Dub with pulmonolgy. He thinks the scan is more consistent with radiation pneumonitis.  He recommends 60 mg of prednisone  daily.  I gave the patient his first dose in the ED.  He does not recommend starting anticoagulation at this time as he has low suspicion for PE.  Patient is on 4 L of oxygen.  He is admitted for further workup and care  Procedures: Procedures  Medications administered in the ED: Medications  predniSONE  (DELTASONE ) tablet 60 mg (has no administration in  time range)  sodium chloride  0.9 % bolus 1,000 mL (1,000 mLs Intravenous New Bag/Given 11/08/23 1951)  iohexol  (OMNIPAQUE ) 350 MG/ML injection 75 mL (75 mLs Intravenous Contrast Given 11/08/23 1913)     Impression: 1. Acute hypoxic respiratory failure (HCC)   2. Radiation pneumonitis (HCC)      Patient's presentation is most consistent with acute presentation with potential threat to life or bodily function.  Disposition: ED Disposition:  Admit     ED Discharge Orders     None             Dionisio Blunt, MD 11/08/23 7673    Simon Lavonia SAILOR, MD 11/09/23 0005

## 2023-11-08 NOTE — ED Triage Notes (Signed)
 Pt to the ed from Dr. Lawerance via ems with a cc of sob and feeling unwell since Friday. Pt has a  Lung cancer with active chemo treatment. Pt is  wheel chair bound at baseline ems gave  500 fluids en route. PT denies fever/ chills chest pain.  Pt A&Ox4

## 2023-11-08 NOTE — Progress Notes (Signed)
  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   Symptoms of fatigue {ACTIONS; HAVE/HAVE NOT:19434} improved since completing therapy.  Symptoms of skin changes {ACTIONS; HAVE/HAVE NOT:19434} improved since completing therapy.  Symptoms of esophagitis {ACTIONS; HAVE/HAVE WNU:80565} improved since completing therapy.   The patient has scheduled follow up with {his/her/their:21314} medical oncologist Dr. PIERRETTE for ongoing care, and was encouraged to call if {he/she/they:23295} develops concerns or questions regarding radiation.

## 2023-11-08 NOTE — H&P (Signed)
 History and Physical    Stephen Meza FMW:[id number] DOB: Jul 21, 1933 DOA: 11/08/2023  PCP: [name], FNP  Patient coming from: Home  Chief Complaint: Shortness of breath  HPI: Stephen Meza is a 89 y.o. male with medical history significant of hypertension, hyperlipidemia, PVD, A-fib, CVA in March 2025, lung cancer status post concurrent chemoradiation, history of prostate and bladder cancer, gout, chronic anemia presenting for evaluation of shortness of breath and syncope.  ED Course: Vital signs on arrival: Temperature 98.5 F, pulse 105, respiratory rate 21, blood pressure 126/70, SpO2 92% on 2 L [place].  Labs showing no leukocytosis, hemoglobin 8.2, MCV 96.1, sodium 126, chloride 95, bicarb 19, anion gap 12, glucose 141, BUN 29, creatinine 0.8, calcium  8.0, albumin  2.3, normal LFTs, INR 1.3, blood cultures in process, UA pending, lactic acid 2.9, COVID/influenza/RSV PCR pending, troponin 98.  EKG showing sinus tachycardia, borderline PR prolongation, LAFB, and no acute ischemic changes in comparison to previous EKG.    CT angiogram chest showing interval development of multi lobar large areas of new ground glass attenuation, interlobular septal thickening cystic changes/bronchiectasis most consistent with pulmonary fat embolism.  Differentials include ARDS and postradiation/postchemotherapy pneumonitis.  No large filling defect to the level of the segmental arteries.  Some filling defects in subsegmental branches may represent artifact, however, tiny emboli not excluded.  Showing right renal mass, grossly stable to prior consistent with known malignancy.  Small right pleural effusion, slightly increased from prior.  Left adrenal nodule stable to prior.  Stable aneurysmal dilation of ascending aorta and enlarged pulmonary artery.  Patient was given 1 L normal saline.  EDP discussed the case and CT findings with on-call pulmonary critical care physician who felt that PE is  less likely.  They recommended starting oral prednisone  60 mg daily and admission to medicine service.  PCCM will consult.  Review of Systems:  ROS  Past Medical History:  Diagnosis Date   Bladder cancer (HCC) 03/19/2016   DUODENITIS, WITHOUT HEMORRHAGE 07/14/2007   ERECTILE DYSFUNCTION 07/14/2007   Esophageal stricture 03/19/2016   ESOPHAGITIS 07/14/2007   EXTERNAL HEMORRHOIDS 07/14/2007   GERD 07/14/2007   Gout    HYPERTENSION 07/14/2007   PERIPHERAL NEUROPATHY 07/14/2007   Peripheral neuropathy 04/06/2019   PERIPHERAL VASCULAR DISEASE 07/14/2007   PODAGRA 07/14/2007   Prostate cancer (HCC) 02/2012   pt had screening and then biopsy   Prostate cancer (HCC) 04/13/2012   Mild rise in PSA lead to prostate biopsy - 1:11 positive for adenocarcinoma. CT abd/pelvis was negative except for enlarged prostate.     Treatment - hormonal     Retinal tear of right eye 03/19/2016   Stroke (HCC) 05/09/2023   TOBACCO ABUSE, HX OF 02/12/2010   TONSILLECTOMY, HX OF 07/14/2007   TREMOR, ESSENTIAL 07/14/2007   VITAMIN B12 DEFICIENCY 07/14/2007    Past Surgical History:  Procedure Laterality Date   BRONCHIAL BIOPSY  07/26/2023   Procedure: BRONCHOSCOPY, WITH BIOPSY;  Surgeon: [name], MD;  Location: [facility] ENDOSCOPY;  Service: Pulmonary;;   BRONCHIAL BRUSHINGS  07/26/2023   Procedure: BRONCHOSCOPY, WITH BRUSH BIOPSY;  Surgeon: [name], MD;  Location: [facility] ENDOSCOPY;  Service: Pulmonary;;   Excision of lipoma     IR IMAGING GUIDED PORT INSERTION  08/16/2023   PROSTATE BIOPSY Bilateral 03/10/2012   surgery to reduce turbinate and straighten deviated septum  2016   TONSILLECTOMY     TRANSCAROTID ARTERY REVASCULARIZATION  Right 05/14/2023   Procedure: TRANSCAROTID ARTERY REVASCULARIZATION (TCAR) UISNG  10mm X 40mm ENROUTE TRANSCAROTID STENT SYSTEM;  Surgeon: Lanis Fonda BRAVO, MD;  Location: [facility] OR;  Service: Vascular;  Laterality: Right;   VIDEO BRONCHOSCOPY  07/26/2023   Procedure: VIDEO  BRONCHOSCOPY WITHOUT FLUORO;  Surgeon: [name], MD;  Location: [facility] ENDOSCOPY;  Service: Pulmonary;;     reports that he has quit smoking. His smoking use included cigarettes. He started smoking about 29 years ago. He has a 22.3 pack-year smoking history. He has never used smokeless tobacco. He reports that he does not currently use alcohol. He reports that he does not use drugs.  Allergies  Allergen Reactions   Ciprofloxacin Hcl Other (See Comments)    numbness   Statins Other (See Comments)    Intolerance, joint and muscle pain   Nitrofuran Derivatives Rash    Family History  Problem Relation Age of Onset   Hypothyroidism Daughter    Breast cancer Other    Cancer Mother    Other Father    Breast cancer Sister     Prior to Admission medications   Medication Sig Start Date End Date Taking? Authorizing Provider  allopurinol  (ZYLOPRIM ) 100 MG tablet TAKE 1 TABLET BY MOUTH EVERY DAY 07/12/23  Yes Harden Jerona GAILS, MD  aspirin  EC 81 MG tablet Take 1 tablet (81 mg total) by mouth daily. Okay to restart on 07/27/2023.  Swallow whole. 07/26/23  Yes [name], MD  benzonatate  (TESSALON ) 100 MG capsule TAKE 1 CAPSULE (100 MG TOTAL) BY MOUTH EVERY 6 (SIX) HOURS AS NEEDED FOR COUGH 10/11/23 10/10/24 Yes Ruthell Lauraine FALCON, NP  carvedilol  (COREG ) 3.125 MG tablet TAKE 1 TABLET BY MOUTH TWICE A DAY WITH A MEAL 07/28/23  Yes [name], FNP  clopidogrel  (PLAVIX ) 75 MG tablet Take 1 tablet (75 mg total) by mouth daily. 10/11/23  Yes [name], FNP  Cyanocobalamin  (B-12) 3000 MCG CAPS Take 3,000 mcg by mouth daily.   Yes [provider]  diclofenac  Sodium (VOLTAREN ) 1 % GEL Apply 4 g topically 4 (four) times daily. Patient taking differently: Apply 2 g topically at bedtime. 06/09/23  Yes [name], FNP  docusate sodium  (COLACE) 100 MG capsule Take 1 capsule (100 mg total) by mouth 2 (two) times daily. 10/11/23  Yes [name], FNP  ezetimibe  (ZETIA )  10 MG tablet TAKE 1 TABLET BY MOUTH EVERY DAY 07/02/23  Yes [name], FNP  lidocaine -prilocaine  (EMLA ) cream Apply 1 Application topically as needed. 08/17/23  Yes Sherrod Sherrod, MD  omeprazole  (PRILOSEC) 40 MG capsule Take 40 mg by mouth daily.   Yes [provider]  polyethylene glycol (MIRALAX ) 17 g packet Take 17 g by mouth daily as needed for moderate constipation or severe constipation (not helped with stool softner). 10/11/23  Yes [name], FNP    Physical Exam: Vitals:   11/08/23 1709 11/08/23 1710 11/08/23 1800 11/08/23 1900  BP: 126/70  119/77 122/72  Pulse: (!) 105  94 92  Resp: (!) 21  (!) 22 18  Temp: 98.5 F (36.9 C)     TempSrc: Oral     SpO2: 92%  90% 94%  Weight:  69 kg    Height:  6' (1.829 m)      Physical Exam  Labs on Admission: I have personally reviewed following labs and imaging studies  CBC: Recent Labs  Lab 11/02/23 1038 11/08/23 1716  WBC 5.5 9.0  NEUTROABS 4.1 8.0*  HGB 9.4* 8.2*  HCT 28.3* 27.0*  MCV  89.7 96.1  PLT 311.0 213   Basic Metabolic Panel: Recent Labs  Lab 11/02/23 1038 11/08/23 1716  NA 130* 126*  K 3.9 3.8  CL 95* 95*  CO2 26 19*  GLUCOSE 108* 141*  BUN 17 29*  CREATININE 0.72 0.79  CALCIUM  9.3 8.0*   GFR: Estimated Creatinine Clearance: 62.3 mL/min (by C-G formula based on SCr of 0.79 mg/dL). Liver Function Tests: Recent Labs  Lab 11/02/23 1038 11/08/23 1716  AST 22 36  ALT 26 30  ALKPHOS 74 81  BILITOT 0.6 0.5  PROT 7.3 5.9*  ALBUMIN  3.6 2.3*   No results for input(s): LIPASE, AMYLASE in the last 168 hours. No results for input(s): AMMONIA in the last 168 hours. Coagulation Profile: Recent Labs  Lab 11/08/23 1716  INR 1.3*   Cardiac Enzymes: No results for input(s): CKTOTAL, CKMB, CKMBINDEX, TROPONINI in the last 168 hours. BNP (last 3 results) No results for input(s): PROBNP in the last 8760 hours. HbA1C: No results for input(s): HGBA1C in the  last 72 hours. CBG: No results for input(s): GLUCAP in the last 168 hours. Lipid Profile: No results for input(s): CHOL, HDL, LDLCALC, TRIG, CHOLHDL, LDLDIRECT in the last 72 hours. Thyroid  Function Tests: No results for input(s): TSH, T4TOTAL, FREET4, T3FREE, THYROIDAB in the last 72 hours. Anemia Panel: No results for input(s): VITAMINB12, FOLATE, FERRITIN, TIBC, IRON , RETICCTPCT in the last 72 hours. Urine analysis:    Component Value Date/Time   COLORURINE YELLOW 05/21/2023 1448   APPEARANCEUR CLEAR 05/21/2023 1448   LABSPEC 1.020 05/21/2023 1448   PHURINE 6.0 05/21/2023 1448   GLUCOSEU NEGATIVE 05/21/2023 1448   HGBUR NEGATIVE 05/21/2023 1448   BILIRUBINUR NEGATIVE 05/21/2023 1448   KETONESUR NEGATIVE 05/21/2023 1448   UROBILINOGEN 1.0 05/21/2023 1448   NITRITE NEGATIVE 05/21/2023 1448   LEUKOCYTESUR TRACE (A) 05/21/2023 1448    Radiological Exams on Admission: CT Angio Chest PE W and/or Wo Contrast Addendum Date: 11/08/2023 ADDENDUM REPORT: 11/08/2023 20:30 ADDENDUM: Critical Value/emergent results were called by telephone at the time of interpretation by Dr Duwaine Severs on Sept 15, 530 p.m. to provider Dr. Simon who verbally acknowledged these results. Electronically Signed   By: Megan  Zare M.D.   On: 11/08/2023 20:30   Result Date: 11/08/2023 CLINICAL DATA:  High probability for PE, non-small cell lung cancer. EXAM: CT ANGIOGRAPHY CHEST WITH CONTRAST TECHNIQUE: Multidetector CT imaging of the chest was performed using the standard protocol during bolus administration of intravenous contrast. Multiplanar CT image reconstructions and MIPs were obtained to evaluate the vascular anatomy. RADIATION DOSE REDUCTION: This exam was performed according to the departmental dose-optimization program which includes automated exposure control, adjustment of the mA and/or kV according to patient size and/or use of iterative reconstruction technique. CONTRAST:   75mL OMNIPAQUE  IOHEXOL  350 MG/ML SOLN COMPARISON:  PET-CT August 11, 2023, chest CT October 26, 2023. FINDINGS: Cardiovascular: Many of the distal subsegmental branches predominantly in right lower lobe are under opacified possibility due to ongoing postradiation/posttreatment pneumonitis versus parenchymal infectious/inflammatory process. No large pulmonary embolism to the level of segmental branches. The heart size is enlarged. Pulmonary artery is dilated measuring up to 37 mm. 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 Right hepatic lobe calcification. Mediastinum/Nodes: Multi station lymphadenopathy index lymph node in right lower paratracheal measures 1.3 cm. Soft tissue in right hilar region may represent atelectasis/consolidation versus conglomerate lymph nodes Lungs/Pleura: Progressive ground-glass attenuation, interlobular septal thickening and now involving entire lobes, previously predominantly in right lower lobe. There  is a interval increase in right-sided pleural effusion now small. The right hilar mass is grossly stable measures 4.3 x 5.4 cm, previously measured 3 x 5.6 cm. Upper Abdomen: Suggestive of prior granulomatous disease. Punctate calcification is also identified in the spleen. Left adrenal nodule measuring approximately 2.3 x 1.4 cm, stable. Right adrenal is slightly thickened. Hiatal hernia. Musculoskeletal: Multilevel degenerative changes of the spine. Right sixth and 8 rib healed fracture. DISH . Review of the MIP images confirms the above findings. IMPRESSION: Interval development of multi lobar large areas of new ground-glass attenuation, interlobular septal thickening cystic changes/bronchiectasis most consistent with pulmonary fat embolism. Differentials include acute respiratory distress syndrome (ARDS) and posttreatment changes (postradiation/post chemotherapy pneumonitis in the presence of recent therapy). No large filling defect to the level of segmental arteries. Some  filling defects in subsegmental branches may represent artifact however tiny emboli can not be excluded. Right hilar mass, grossly stable to prior consistent with known malignancy. Small right pleural effusion, slightly increased to prior. Left adrenal nodule stable to prior. Stable aneurysmal dilation of ascending aorta and enlarged pulmonary artery. https://radiopaedia.org/articles/[email or web address]-fat-embolism?lang=us  Electronically Signed: By: Duwaine Severs M.D. On: 11/08/2023 20:20    Assessment and Plan  Acute hypoxemic respiratory failure Patient is currently requiring 4 L [place] to maintain sats in the 90s.  No respiratory distress.  No fever or leukocytosis.  CT angiogram chest showing interval development of multi lobar large areas of new ground glass attenuation, interlobular septal thickening cystic changes/bronchiectasis most consistent with pulmonary fat embolism.  Differentials include ARDS and postradiation/postchemotherapy pneumonitis.  No large filling defect to the level of the segmental arteries.  Some filling defects in subsegmental branches may represent artifact, however, tiny emboli not excluded.  EDP discussed the case and CT findings with on-call pulmonary critical care physician who felt that PE is less likely.  They recommended starting oral prednisone  60 mg daily and admission to medicine service.  PCCM will consult. -Admit to progressive care unit for close monitoring -Continue prednisone  60 mg daily -Continue supplemental oxygen -Continuous pulse ox -Check procalcitonin level  Syncope PCCM feels PE is less likely.  Continue cardiac monitoring and echocardiogram ordered.  Check orthostatics, fall precautions.  Hyponatremia ?Poor p.o. intake in the setting of cancer versus SIADH.  Sodium 126, previously 130 on labs 6 days ago.  Patient was given 1 L IV fluids in the ED.  Repeat labs ordered to check sodium level and monitor every 6 hours.  Check serum osmolarity.  Mild lactic  acidosis Likely due to dehydration.  No fever or leukocytosis to suggest infection/sepsis.  Mild tachycardia resolved after IV fluids.  Not hypotensive.  Repeat lactate ordered.  Follow-up UA and blood cultures.  Follow-up COVID/influenza/RSV PCR.  Check procalcitonin level.  Elevated troponin Likely due to demand ischemia.  Troponin 98.  EKG without acute ischemic changes and patient is not endorsing chest pain.  Trend troponin.  Hypertension  Hyperlipidemia  PVD  Paroxysmal A-fib  History of CVA  Stage IIIb non-small cell lung cancer, squamous cell carcinoma, status post chemoradiation Last oncology office visit was on 11/02/2023 and plan was to start him on immunotherapy.  Outpatient oncology follow-up.  Gout  Chronic normocytic anemia No significant change compared to labs done over the past 6 weeks.  No signs of bleeding.  DVT prophylaxis: {Blank single:19197::Lovenox ,SQ Heparin ,IV heparin  gtt,Xarelto,Eliquis ,Coumadin,SCDs,***} Code Status: {Blank single:19197::Full Code,DNR,DNR/DNI,Comfort Care,***} Family Communication: ***  Consults called: ***  Level of care: {Blank single:19197::Med-Surg,Telemetry bed,Progressive Care Unit,Step Down Unit} Admission status: ***  Time Spent: 75+ minutes***  Editha Ram MD [facility]  If 7PM-7AM, please contact night-coverage [email or web address]  11/08/2023, 10:53 PM

## 2023-11-08 NOTE — Progress Notes (Signed)
   11/08/23 2351  Oxygen Therapy/Pulse Ox  O2 Device HHFNC  Heated High Flow Nasal Cannula  Yes  Heated High Flow Nasal Cannula  Adult Large  $ Adult Large Yes  O2 Therapy Oxygen humidified  Heater temperature 93.2 F (34 C)  O2 Flow Rate (L/min) 60 L/min  FiO2 (%) 70 %   RT called to room unable to get a sat pt having increased WOB pt was 10L [place]  Placed pt on HHFNC per provider 60L 70%

## 2023-11-09 ENCOUNTER — Ambulatory Visit

## 2023-11-09 ENCOUNTER — Encounter (HOSPITAL_COMMUNITY): Payer: Self-pay | Admitting: Internal Medicine

## 2023-11-09 ENCOUNTER — Inpatient Hospital Stay (HOSPITAL_COMMUNITY)

## 2023-11-09 ENCOUNTER — Inpatient Hospital Stay

## 2023-11-09 ENCOUNTER — Telehealth: Payer: Self-pay

## 2023-11-09 DIAGNOSIS — R55 Syncope and collapse: Secondary | ICD-10-CM

## 2023-11-09 DIAGNOSIS — D638 Anemia in other chronic diseases classified elsewhere: Secondary | ICD-10-CM

## 2023-11-09 DIAGNOSIS — B348 Other viral infections of unspecified site: Secondary | ICD-10-CM

## 2023-11-09 DIAGNOSIS — R7989 Other specified abnormal findings of blood chemistry: Secondary | ICD-10-CM

## 2023-11-09 DIAGNOSIS — E872 Acidosis, unspecified: Secondary | ICD-10-CM

## 2023-11-09 DIAGNOSIS — I480 Paroxysmal atrial fibrillation: Secondary | ICD-10-CM

## 2023-11-09 DIAGNOSIS — C3491 Malignant neoplasm of unspecified part of right bronchus or lung: Secondary | ICD-10-CM

## 2023-11-09 DIAGNOSIS — R042 Hemoptysis: Secondary | ICD-10-CM

## 2023-11-09 DIAGNOSIS — R339 Retention of urine, unspecified: Secondary | ICD-10-CM

## 2023-11-09 DIAGNOSIS — E876 Hypokalemia: Secondary | ICD-10-CM

## 2023-11-09 DIAGNOSIS — M109 Gout, unspecified: Secondary | ICD-10-CM

## 2023-11-09 DIAGNOSIS — K219 Gastro-esophageal reflux disease without esophagitis: Secondary | ICD-10-CM

## 2023-11-09 LAB — CBC
HCT: 32.6 % — ABNORMAL LOW (ref 39.0–52.0)
Hemoglobin: 10.1 g/dL — ABNORMAL LOW (ref 13.0–17.0)
MCH: 28.9 pg (ref 26.0–34.0)
MCHC: 31 g/dL (ref 30.0–36.0)
MCV: 93.4 fL (ref 80.0–100.0)
Platelets: 249 K/uL (ref 150–400)
RBC: 3.49 MIL/uL — ABNORMAL LOW (ref 4.22–5.81)
RDW: 20 % — ABNORMAL HIGH (ref 11.5–15.5)
WBC: 9.9 K/uL (ref 4.0–10.5)
nRBC: 0 % (ref 0.0–0.2)

## 2023-11-09 LAB — BASIC METABOLIC PANEL WITH GFR
Anion gap: 11 (ref 5–15)
Anion gap: 10 (ref 5–15)
BUN: 19 mg/dL (ref 8–23)
BUN: 20 mg/dL (ref 8–23)
CO2: 19 mmol/L — ABNORMAL LOW (ref 22–32)
CO2: 22 mmol/L (ref 22–32)
Calcium: 7.9 mg/dL — ABNORMAL LOW (ref 8.9–10.3)
Calcium: 8.7 mg/dL — ABNORMAL LOW (ref 8.9–10.3)
Chloride: 101 mmol/L (ref 98–111)
Chloride: 100 mmol/L (ref 98–111)
Creatinine, Ser: 0.69 mg/dL (ref 0.61–1.24)
Creatinine, Ser: 0.73 mg/dL (ref 0.61–1.24)
GFR, Estimated: >60 mL/min (ref >60)
GFR, Estimated: >60 mL/min (ref >60)
Glucose, Bld: 117 mg/dL — ABNORMAL HIGH (ref 70–99)
Glucose, Bld: 145 mg/dL — ABNORMAL HIGH (ref 70–99)
Potassium: 3.2 mmol/L — ABNORMAL LOW (ref 3.5–5.1)
Potassium: 4.4 mmol/L (ref 3.5–5.1)
Sodium: 131 mmol/L — ABNORMAL LOW (ref 135–145)
Sodium: 132 mmol/L — ABNORMAL LOW (ref 135–145)

## 2023-11-09 LAB — CBG MONITORING, ED
Glucose-Capillary: 126 mg/dL — ABNORMAL HIGH (ref 70–99)
Glucose-Capillary: 136 mg/dL — ABNORMAL HIGH (ref 70–99)
Glucose-Capillary: 146 mg/dL — ABNORMAL HIGH (ref 70–99)

## 2023-11-09 LAB — RESPIRATORY PANEL BY PCR

## 2023-11-09 LAB — ECHOCARDIOGRAM COMPLETE
Height: 72 in
S' Lateral: 3 cm
Single Plane A4C EF: 40.7 %
Weight: 2433.88 [oz_av]

## 2023-11-09 LAB — RESP PANEL BY RT-PCR (RSV, FLU A&B, COVID)  RVPGX2
Influenza A by PCR: NEGATIVE
Influenza B by PCR: NEGATIVE
Resp Syncytial Virus by PCR: NEGATIVE
SARS Coronavirus 2 by RT PCR: NEGATIVE

## 2023-11-09 LAB — MRSA NEXT GEN BY PCR, NASAL: MRSA by PCR Next Gen: NOT DETECTED

## 2023-11-09 LAB — GLUCOSE, CAPILLARY
Glucose-Capillary: 128 mg/dL — ABNORMAL HIGH (ref 70–99)
Glucose-Capillary: 130 mg/dL — ABNORMAL HIGH (ref 70–99)

## 2023-11-09 LAB — TROPONIN I (HIGH SENSITIVITY)
Troponin I (High Sensitivity): 102 ng/L (ref <18)
Troponin I (High Sensitivity): 99 ng/L — ABNORMAL HIGH (ref <18)

## 2023-11-09 LAB — OSMOLALITY: Osmolality: 287 mosm/kg (ref 275–295)

## 2023-11-09 LAB — PHOSPHORUS: Phosphorus: 3.7 mg/dL (ref 2.5–4.6)

## 2023-11-09 LAB — MAGNESIUM: Magnesium: 2.1 mg/dL (ref 1.7–2.4)

## 2023-11-09 LAB — STREP PNEUMONIAE URINARY ANTIGEN: Strep Pneumo Urinary Antigen: NEGATIVE

## 2023-11-09 LAB — PROCALCITONIN: Procalcitonin: 0.23 ng/mL

## 2023-11-09 MED ORDER — VANCOMYCIN HCL 1500 MG/300ML IV SOLN
1500.0000 mg | Freq: Once | INTRAVENOUS | Status: AC
  Administered 2023-11-09: 1500 mg via INTRAVENOUS
  Filled 2023-11-09: qty 300

## 2023-11-09 MED ORDER — HYALURONIDASE HUMAN 150 UNIT/ML IJ SOLN
150.0000 [IU] | Freq: Once | INTRAMUSCULAR | Status: AC
Start: 2023-11-09 — End: 2023-11-09
  Administered 2023-11-09: 150 [IU] via SUBCUTANEOUS
  Filled 2023-11-09: qty 1

## 2023-11-09 MED ORDER — EZETIMIBE 10 MG PO TABS
10.0000 mg | ORAL_TABLET | Freq: Every day | ORAL | Status: DC
Start: 2023-11-09 — End: 2023-11-13
  Administered 2023-11-09 – 2023-11-12 (×4): 10 mg via ORAL
  Filled 2023-11-09 (×4): qty 1

## 2023-11-09 MED ORDER — CEFTRIAXONE SODIUM 1 G IJ SOLR
1.0000 g | Freq: Once | INTRAMUSCULAR | Status: AC
  Administered 2023-11-09: 1 g via INTRAVENOUS
  Filled 2023-11-09: qty 10

## 2023-11-09 MED ORDER — CARVEDILOL 3.125 MG PO TABS
3.1250 mg | ORAL_TABLET | Freq: Two times a day (BID) | ORAL | Status: DC
Start: 2023-11-09 — End: 2023-11-10
  Administered 2023-11-09 – 2023-11-10 (×3): 3.125 mg via ORAL
  Filled 2023-11-09 (×3): qty 1

## 2023-11-09 MED ORDER — INSULIN ASPART 100 UNIT/ML IJ SOLN
2.0000 [IU] | INTRAMUSCULAR | Status: DC
  Administered 2023-11-09 (×3): 2 [IU] via SUBCUTANEOUS
  Administered 2023-11-10: 4 [IU] via SUBCUTANEOUS
  Administered 2023-11-11 (×2): 2 [IU] via SUBCUTANEOUS

## 2023-11-09 MED ORDER — ACETAMINOPHEN 650 MG RE SUPP: 650.0000 mg | Freq: Four times a day (QID) | RECTAL | Status: DC | PRN

## 2023-11-09 MED ORDER — SODIUM CHLORIDE 0.9 % IV SOLN
500.0000 mg | INTRAVENOUS | Status: DC
  Administered 2023-11-09: 500 mg via INTRAVENOUS
  Filled 2023-11-09: qty 5

## 2023-11-09 MED ORDER — ASPIRIN 81 MG PO TBEC
81.0000 mg | DELAYED_RELEASE_TABLET | Freq: Every day | ORAL | Status: DC
  Administered 2023-11-09 – 2023-11-12 (×4): 81 mg via ORAL
  Filled 2023-11-09 (×4): qty 1

## 2023-11-09 MED ORDER — PREDNISONE 20 MG PO TABS: 60.0000 mg | ORAL_TABLET | Freq: Every day | ORAL | Status: DC

## 2023-11-09 MED ORDER — SODIUM CHLORIDE 0.9 % IV SOLN: 1.0000 g | INTRAVENOUS | Status: DC

## 2023-11-09 MED ORDER — ALLOPURINOL 100 MG PO TABS
100.0000 mg | ORAL_TABLET | Freq: Every day | ORAL | Status: DC
Start: 2023-11-09 — End: 2023-11-13
  Administered 2023-11-09 – 2023-11-12 (×4): 100 mg via ORAL
  Filled 2023-11-09 (×4): qty 1

## 2023-11-09 MED ORDER — POTASSIUM CHLORIDE 20 MEQ PO PACK
40.0000 meq | PACK | Freq: Once | ORAL | Status: AC
  Administered 2023-11-09: 40 meq via ORAL
  Filled 2023-11-09: qty 2

## 2023-11-09 MED ORDER — ACETAMINOPHEN 325 MG PO TABS
650.0000 mg | ORAL_TABLET | Freq: Four times a day (QID) | ORAL | Status: DC | PRN
  Administered 2023-11-12: 650 mg via ORAL
  Filled 2023-11-09: qty 2

## 2023-11-09 MED ORDER — AZITHROMYCIN 500 MG PO TABS
500.0000 mg | ORAL_TABLET | Freq: Every day | ORAL | Status: AC
  Administered 2023-11-10 – 2023-11-11 (×2): 500 mg via ORAL
  Filled 2023-11-09 (×2): qty 1

## 2023-11-09 MED ORDER — BENZONATATE 100 MG PO CAPS: 100.0000 mg | ORAL_CAPSULE | Freq: Three times a day (TID) | ORAL | Status: DC | PRN

## 2023-11-09 MED ORDER — HYDROCORTISONE SOD SUC (PF) 100 MG IJ SOLR
100.0000 mg | Freq: Three times a day (TID) | INTRAMUSCULAR | Status: DC
  Administered 2023-11-09 – 2023-11-10 (×4): 100 mg via INTRAVENOUS
  Filled 2023-11-09 (×4): qty 2

## 2023-11-09 MED ORDER — IPRATROPIUM-ALBUTEROL 0.5-2.5 (3) MG/3ML IN SOLN
3.0000 mL | RESPIRATORY_TRACT | Status: DC | PRN
  Administered 2023-11-10: 3 mL via RESPIRATORY_TRACT
  Filled 2023-11-09: qty 3

## 2023-11-09 MED ORDER — PANTOPRAZOLE SODIUM 40 MG PO TBEC
40.0000 mg | DELAYED_RELEASE_TABLET | Freq: Every day | ORAL | Status: DC
  Administered 2023-11-09 – 2023-11-12 (×4): 40 mg via ORAL
  Filled 2023-11-09 (×5): qty 1

## 2023-11-09 NOTE — Progress Notes (Signed)
  Echocardiogram 2D Echocardiogram has been performed.  Stephen Meza 11/09/2023, 3:09 PM

## 2023-11-09 NOTE — ED Notes (Signed)
 Patient's IV on the right AC infiltrated and needed to be removed. MD was made aware and ordered HYLENEX  to administer around the infiltrated area.

## 2023-11-09 NOTE — ED Notes (Signed)
 Pt had a BM. Bed linen, diaper, bed pad changed. Pericare provided with warm soap and water with assist from NT.

## 2023-11-09 NOTE — Progress Notes (Signed)
Reviewed Chart

## 2023-11-09 NOTE — ED Notes (Signed)
 CCMD called to place the patient on cardiac monitoring services.

## 2023-11-09 NOTE — Consult Note (Signed)
 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 History provided mostly by his wife and daughter at bedside. He is planned to start immunotherapy (Durvalumab) tomorrow. Patient has continued to feel dizzy since after he was started on treatment for his cancer due to poor oral intake. In the last few days, he is having dyspnea with even minimal exertion and today he had 3 episodes of syncope which were witnessed by family.  No falls, head/neck injury,  or seizure-like activity reported.  Denied aspiration/choking with fluids. Denied chest pain, N/V/D, rash, wheezing, and URTI symptoms. He is not HOT. Smoked but quit 35 yrs ago. On Albuterol  PRN but he never used it. Initially stable on 2-4 L nasal cannula but later had increasing oxygen requirements.  Now on 60 L HFNC, 72% FiO2 with SpO2 94%, sinus tachycardia 106. No acute ischemic changes on EKG.   Pertinent  Medical History  Lung stage 3B SqCC of RUL dx June 2025, HTN, dyslipidemia, PVD, A-fib, CVA in March 2025, right carotid stent, prostate and bladder cancer, gout, anemia of chronic illness, peripheral neuropathy, BPH, GERD   Significant Hospital Events: Including procedures, antibiotic start and stop dates in addition to  other pertinent events   11/08/2023 - aDMIT   Interim History / Subjective:   9/16 - still in ER bed 47. On HFNC. Wife and daughter at bedside. AT baseline not on o2. Finished chemo/XR some 4 weeks ago. Immune therapy pending. Presented with fever, dyspnea, hyempptysis, and syncope  Dramatic change in CT chest at admit versus 13 days earlier: Sudden increase in GGO  Afe brile sine amdit DNormal WBC RHINOVIRUS POISITIVE  Objective    Blood pressure 112/88, pulse 97, temperature 97.9 F (36.6 C), temperature source Oral, resp. rate (!) 23, height 6' (1.829 m), weight 69 kg, SpO2 99%.    FiO2 (%):  [65 %-70 %] 65 %   Intake/Output Summary (Last 24 hours) at 11/09/2023 0944 Last data filed at 11/09/2023 0746 Gross per 24 hour  Intake 1618.6 ml  Output --  Net 1618.6 ml   Filed Weights   11/08/23 1710  Weight: 69 kg    Examination: General: alert, oriented x4, and comfortable. On HFFNC 60 L, 75%. SpO2 94%, RR 18 HENT: PERL, normal pharynx and oral mucosa. No LNE or thyromegaly. No JVD Lungs: symmetrical air entry bilaterally. Diffuse crackles. No wheezing. Port-a-cath Cardiovascular: NL S1/S2. No m/g/r Abdomen: no distension or tenderness Extremities: no edema. Symmetrical  Neuro: mild left hand and foot weakness, +4/5, compared to the right since his stroke March 2025   Resolved problem list   Assessment and Plan  Acute hypoxic resp failure and submassive hemoptysis. 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 Remains on HFNC (MRSA PCRc Neg)  Plan  - ok for CAP abx - Azithro  - dc ceftriaxone  - dc vanc = DC PREDNISONE  but START HYDROCORTISONE  for CAP (but might need change to Soluemdrol ) - await urine legionella  - await Fung[place]ell - check ESR  - o2 for pulse ox goal > 88%   Lung stage 3B SqCC of RUL dx June 2025, s/p 6 cycles of chemoRx (last  one was 09/27/2023), and 7 weeks of radiation therapy (last one was 10/08/2023)  Plan  - per onc - immunotherapy on  hold  HTN, dyslipidemia, PVD, A-fib, CVA in March 2025 (right carotid stent on ASA and Plavix ) Type 2 elevated Trop due to acute hypoxic resp failure -Echo  -Hold Plavix   -Ok to resume ASA since he has carotid stent in March  Anemia of chronic illness -Monitor labs  HypoNa -Monitor labs  Lactic acidosis due to hypoxia and resp distress: resolved  DNR/DNI   Wife and ddaughet updated  SIGNATURE    Dr. Dorethia Cave, M.D., F.C.C.P,  Pulmonary and Critical Care Medicine Staff Physician, [facility] Director - Interstitial Lung Disease  Program  Pulmonary Fibrosis [facility] at [facility] [place], [place], [address]   Pager: [phone], If no answer  -> Check AMION or Try [phone] Telephone (clinical office): [phone] Telephone (research): [phone]  9:44 AM 11/09/2023    LABS    PULMONARY No results for input(s): PHART, PCO2ART, PO2ART, HCO3, TCO2, O2SAT in the last 168 hours.  Invalid input(s): PCO2, PO2  CBC Recent Labs  Lab 11/02/23 1038 11/08/23 1716  HGB 9.4* 8.2*  HCT 28.3* 27.0*  WBC 5.5 9.0  PLT 311.0 213    COAGULATION Recent Labs  Lab 11/08/23 1716  INR 1.3*    CARDIAC  No results for input(s): TROPONINI in the last 168 hours. No results for input(s): PROBNP in the last 168 hours.   CHEMISTRY Recent Labs  Lab 11/02/23 1038 11/08/23 1716 11/09/23 0413  NA 130* 126* 131*  K 3.9 3.8 3.2*  CL 95* 95* 101  CO2 26 19* 19*  GLUCOSE 108* 141* 117*  BUN 17 29* 19  CREATININE 0.72 0.79 0.69  CALCIUM  9.3 8.0* 7.9*   Estimated Creatinine Clearance: 62.3 mL/min (by C-G formula based on SCr of 0.69 mg/dL).   LIVER Recent Labs  Lab 11/02/23 1038 11/08/23 1716  AST 22 36  ALT 26 30  ALKPHOS 74 81  BILITOT 0.6 0.5  PROT 7.3 5.9*  ALBUMIN  3.6  2.3*  INR  --  1.3*     INFECTIOUS Recent Labs  Lab 11/08/23 1723 11/08/23 2331 11/09/23 0413  LATICACIDVEN 2.9* 1.2  --   PROCALCITON  --   --  0.23     ENDOCRINE CBG (last 3)  Recent Labs    11/09/23 0529 11/09/23 0802  GLUCAP 126* 136*         IMAGING x48h  - image(s) personally visualized  -   highlighted in bold CT Angio Chest PE W and/or Wo Contrast Addendum Date: 11/08/2023 ADDENDUM REPORT: 11/08/2023 20:30 ADDENDUM: Critical Value/emergent results were called by telephone at the time of interpretation by Dr Duwaine Severs on Sept 15, 530 p.m. to provider Dr. Simon who verbally acknowledged these results. Electronically Signed   By: Megan  Zare M.D.   On: 11/08/2023 20:30   Result  Date: 11/08/2023 CLINICAL DATA:  High probability for PE, non-small cell lung cancer. EXAM: CT ANGIOGRAPHY CHEST WITH CONTRAST TECHNIQUE: Multidetector CT imaging of the chest was performed using the standard protocol during bolus administration of intravenous contrast. Multiplanar CT image reconstructions and MIPs were obtained to evaluate the vascular anatomy. RADIATION DOSE REDUCTION: This exam was performed according to the departmental dose-optimization program which includes automated exposure control, adjustment of the mA and/or kV according to patient size and/or use of iterative reconstruction technique. CONTRAST:  75mL OMNIPAQUE  IOHEXOL  350 MG/ML SOLN COMPARISON:  PET-CT August 11, 2023, chest CT October 26, 2023. FINDINGS: Cardiovascular: Many of the distal subsegmental branches predominantly in right lower lobe are under opacified possibility due to ongoing postradiation/posttreatment pneumonitis versus parenchymal infectious/inflammatory process. No large pulmonary embolism to the level of segmental branches. The heart size is enlarged. Pulmonary artery is dilated measuring up to 37 mm. 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 Right hepatic lobe calcification.  Mediastinum/Nodes: Multi station lymphadenopathy index lymph node in right lower paratracheal measures 1.3 cm. Soft tissue in right hilar region may represent atelectasis/consolidation versus conglomerate lymph nodes Lungs/Pleura: Progressive ground-glass attenuation, interlobular septal thickening and now involving entire lobes, previously predominantly in right lower lobe. There is a interval increase in right-sided pleural effusion now small. The right hilar mass is grossly stable measures 4.3 x 5.4 cm, previously measured 3 x 5.6 cm. Upper Abdomen: Suggestive of prior granulomatous disease. Punctate calcification is also identified in the spleen. Left adrenal nodule measuring approximately 2.3 x 1.4 cm, stable. Right adrenal is slightly thickened. Hiatal hernia. Musculoskeletal: Multilevel degenerative changes of the spine. Right sixth and 8 rib healed fracture. DISH . Review of the MIP images confirms the above findings. IMPRESSION: Interval development of multi lobar large areas of new ground-glass attenuation, interlobular septal thickening cystic changes/bronchiectasis most consistent with pulmonary fat embolism. Differentials include acute respiratory distress syndrome (ARDS) and posttreatment changes (postradiation/post chemotherapy pneumonitis in the presence of recent therapy). No large filling defect to the level of segmental arteries. Some filling defects in subsegmental branches may represent artifact however tiny emboli can not be excluded. Right hilar mass, grossly stable to prior consistent with known malignancy. Small right pleural effusion, slightly increased to prior. Left adrenal nodule stable to prior. Stable aneurysmal dilation of ascending aorta and enlarged pulmonary artery. https://radiopaedia.org/articles/pulmonary-fat-embolism?lang=us  Electronically Signed: By: Megan  Zare M.D. On: 11/08/2023 20:20

## 2023-11-09 NOTE — Consult Note (Signed)
 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 History provided mostly by his wife and daughter at bedside. He is planned to start immunotherapy (Durvalumab) tomorrow. Patient has continued to feel dizzy since after he was started on treatment for his cancer due to poor oral intake. In the last few days, he is having dyspnea with even minimal exertion and today he had 3 episodes of syncope which were witnessed by family.  No falls, head/neck injury,  or seizure-like activity reported.  Denied aspiration/choking with fluids. Denied chest pain, N/V/D, rash, wheezing, and URTI symptoms. He is not HOT. Smoked but quit 35 yrs ago. On Albuterol  PRN but he never used it. Initially stable on 2-4 L nasal cannula but later had increasing oxygen requirements.  Now on 60 L HFNC, 72% FiO2 with SpO2 94%, sinus tachycardia 106. No acute ischemic changes on EKG.   Pertinent  Medical History  Lung stage 3B SqCC of RUL dx June 2025, HTN, dyslipidemia, PVD, A-fib, CVA in March 2025, right carotid stent, prostate and bladder cancer, gout, anemia of chronic illness, peripheral neuropathy, BPH, GERD   Significant Hospital Events: Including procedures, antibiotic start and stop dates in addition to  other pertinent events     Interim History / Subjective:    Objective    Blood pressure 122/72, pulse 92, temperature 98.5 F (36.9 C), temperature source Oral, resp. rate 18, height 6' (1.829 m), weight 69 kg, SpO2 94%.    FiO2 (%):  [70 %] 70 %   Intake/Output Summary (Last 24 hours) at 11/09/2023 0125 Last data filed at 11/09/2023 0102 Gross per 24 hour  Intake 1000 ml  Output --  Net 1000 ml   Filed Weights   11/08/23 1710  Weight: 69 kg    Examination: General: alert, oriented x4, and comfortable. On HFFNC 60 L, 75%. SpO2 94%, RR 18 HENT: PERL, normal pharynx and oral mucosa. No LNE or thyromegaly. No JVD Lungs: symmetrical air entry bilaterally. Diffuse crackles. No wheezing. Port-a-cath Cardiovascular: NL S1/S2. No m/g/r Abdomen: no distension or tenderness Extremities: no edema. Symmetrical  Neuro: mild left hand and foot weakness, +4/5, compared to the right since his stroke March 2025   Resolved problem list   Assessment and Plan  Acute hypoxic resp failure and submassive hemoptysis. 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   -f/u COVID/influenza/RSV PCR  -Resp pathogen panel  -PCR PJP (given steroids for 5 days one month ago), less likely -Fungitell -Resp Cx -f/u BCx2 -Urine Ags -PCT -Rocephin , Zithromax , one dose of Vanco -Steroids 60 mg po daily -ISS -I/S -Duoneb PRN  Lung stage 3B SqCC of RUL dx June 2025, s/p 6 cycles of chemoRx (last one was  09/27/2023), and 7 weeks of radiation therapy (last one was 10/08/2023)  HTN, dyslipidemia, PVD, A-fib, CVA in March 2025 (right carotid stent on ASA and Plavix ) Type 2 elevated Trop due to acute hypoxic resp failure -Echo  -Hold Plavix   -Ok to resume ASA since he has carotid stent in March  Anemia of chronic illness -Monitor labs  HypoNa -Monitor labs  Lactic acidosis due to hypoxia and resp distress:  resolved  DNR/DNI  Labs   CBC: Recent Labs  Lab 11/02/23 1038 11/08/23 1716  WBC 5.5 9.0  NEUTROABS 4.1 8.0*  HGB 9.4* 8.2*  HCT 28.3* 27.0*  MCV 89.7 96.1  PLT 311.0 213    Basic Metabolic Panel: Recent Labs  Lab 11/02/23 1038 11/08/23 1716  NA 130* 126*  K 3.9 3.8  CL 95* 95*  CO2 26 19*  GLUCOSE 108* 141*  BUN 17 29*  CREATININE 0.72 0.79  CALCIUM  9.3 8.0*   GFR: Estimated Creatinine Clearance: 62.3 mL/min (by C-G formula based on SCr of 0.79 mg/dL). Recent Labs  Lab 11/02/23 1038 11/08/23 1716 11/08/23 1723 11/08/23 2331  WBC 5.5 9.0  --   --   LATICACIDVEN  --   --  2.9* 1.2    Liver Function Tests: Recent Labs  Lab 11/02/23 1038 11/08/23 1716  AST 22 36  ALT 26 30  ALKPHOS 74 81  BILITOT 0.6 0.5  PROT 7.3 5.9*  ALBUMIN  3.6 2.3*   No results for input(s): LIPASE, AMYLASE in the last 168 hours. No results for input(s): AMMONIA in the last 168 hours.  ABG No results found for: PHART, PCO2ART, PO2ART, HCO3, TCO2, ACIDBASEDEF, O2SAT   Coagulation Profile: Recent Labs  Lab 11/08/23 1716  INR 1.3*    Cardiac Enzymes: No results for input(s): CKTOTAL, CKMB, CKMBINDEX, TROPONINI in the last 168 hours.  HbA1C: Hgb A1c MFr Bld  Date/Time Value Ref Range Status  11/02/2023 10:38 AM 5.9 4.6 - 6.5 % Final    Comment:    Glycemic Control Guidelines for People with Diabetes:Non Diabetic:  <6%Goal of Therapy: <7%Additional Action Suggested:  >8%   05/10/2023 09:39 AM 5.5 4.8 - 5.6 % Final    Comment:    (NOTE) Pre diabetes:          5.7%-6.4%  Diabetes:              >6.4%  Glycemic control for   <7.0% adults with diabetes     CBG: No results for input(s): GLUCAP in the last 168 hours.  Review of Systems:   Review of Systems  Constitutional:  Positive for fever, malaise/fatigue and weight loss. Negative for chills and diaphoresis.  HENT:  Negative for congestion, nosebleeds, sinus pain and sore throat.    Respiratory:  Positive for cough, hemoptysis, sputum production and shortness of breath. Negative for wheezing and stridor.   Cardiovascular:  Negative for chest pain, palpitations, orthopnea, claudication, leg swelling and PND.  Gastrointestinal:  Negative for diarrhea, nausea and vomiting.  Skin:  Negative for itching and rash.  Neurological:  Positive for dizziness, focal weakness (chronic) and loss of consciousness. Negative for tremors, sensory change, speech change, seizures and headaches.     Past Medical History:  He,  has a past medical history of Bladder cancer (HCC) (03/19/2016), DUODENITIS, WITHOUT HEMORRHAGE (07/14/2007), ERECTILE DYSFUNCTION (07/14/2007), Esophageal stricture (03/19/2016), ESOPHAGITIS (07/14/2007), EXTERNAL HEMORRHOIDS (07/14/2007), GERD (07/14/2007), Gout, HYPERTENSION (07/14/2007), PERIPHERAL NEUROPATHY (07/14/2007), Peripheral neuropathy (04/06/2019), PERIPHERAL VASCULAR DISEASE (07/14/2007), PODAGRA (07/14/2007), Prostate cancer (HCC) (02/2012), Prostate  cancer (HCC) (04/13/2012), Retinal tear of right eye (03/19/2016), Stroke (HCC) (05/09/2023), TOBACCO ABUSE, HX OF (02/12/2010), TONSILLECTOMY, HX OF (07/14/2007), TREMOR, ESSENTIAL (07/14/2007), and VITAMIN B12 DEFICIENCY (07/14/2007).   Surgical History:   Past Surgical History:  Procedure Laterality Date   BRONCHIAL BIOPSY  07/26/2023   Procedure: BRONCHOSCOPY, WITH BIOPSY;  Surgeon: [name], MD;  Location: [facility] ENDOSCOPY;  Service: Pulmonary;;   BRONCHIAL BRUSHINGS  07/26/2023   Procedure: BRONCHOSCOPY, WITH BRUSH BIOPSY;  Surgeon: [name], MD;  Location: [facility] ENDOSCOPY;  Service: Pulmonary;;   Excision of lipoma     IR IMAGING GUIDED PORT INSERTION  08/16/2023   PROSTATE BIOPSY Bilateral 03/10/2012   surgery to reduce turbinate and straighten deviated septum  2016   TONSILLECTOMY     TRANSCAROTID ARTERY REVASCULARIZATION  Right 05/14/2023   Procedure: TRANSCAROTID ARTERY REVASCULARIZATION (TCAR)  UISNG 10mm X 40mm ENROUTE TRANSCAROTID STENT SYSTEM;  Surgeon: Lanis Fonda BRAVO, MD;  Location[facility];  Service: Vascular;  Laterality: Right;   VIDEO BRONCHOSCOPY  07/26/2023   Procedure: VIDEO BRONCHOSCOPY WITHOUT FLUORO;  Surgeon: [name], MD;  Location: [facility] ENDOSCOPY;  Service: Pulmonary;;     Social History:   reports that he has quit smoking. His smoking use included cigarettes. He started smoking about 29 years ago. He has a 22.3 pack-year smoking history. He has never used smokeless tobacco. He reports that he does not currently use alcohol. He reports that he does not use drugs.   Family History:  His family history includes Breast cancer in his sister and another family member; Cancer in his mother; Hypothyroidism in his daughter; Other in his father.   Allergies Allergies  Allergen Reactions   Ciprofloxacin Hcl Other (See Comments)    numbness   Statins Other (See Comments)    Intolerance, joint and muscle pain   Nitrofuran Derivatives Rash     Home Medications  Prior to Admission medications   Medication Sig Start Date End Date Taking? Authorizing Provider  allopurinol  (ZYLOPRIM ) 100 MG tablet TAKE 1 TABLET BY MOUTH EVERY DAY 07/12/23  Yes Harden Jerona GAILS, MD  aspirin  EC 81 MG tablet Take 1 tablet (81 mg total) by mouth daily. Okay to restart on 07/27/2023.  Swallow whole. 07/26/23  Yes [name], MD  benzonatate  (TESSALON ) 100 MG capsule TAKE 1 CAPSULE (100 MG TOTAL) BY MOUTH EVERY 6 (SIX) HOURS AS NEEDED FOR COUGH 10/11/23 10/10/24 Yes Ruthell Lauraine FALCON, NP  carvedilol  (COREG ) 3.125 MG tablet TAKE 1 TABLET BY MOUTH TWICE A DAY WITH A MEAL 07/28/23  Yes [name], FNP  clopidogrel  (PLAVIX ) 75 MG tablet Take 1 tablet (75 mg total) by mouth daily. 10/11/23  Yes [name], FNP  Cyanocobalamin  (B-12) 3000 MCG CAPS Take 3,000 mcg by mouth daily.   Yes [provider]  diclofenac  Sodium (VOLTAREN ) 1 % GEL Apply 4 g topically 4 (four) times  daily. Patient taking differently: Apply 2 g topically at bedtime. 06/09/23  Yes [name], FNP  docusate sodium  (COLACE) 100 MG capsule Take 1 capsule (100 mg total) by mouth 2 (two) times daily. 10/11/23  Yes [name], FNP  ezetimibe  (ZETIA ) 10 MG tablet TAKE 1 TABLET BY MOUTH EVERY DAY 07/02/23  Yes [name], FNP  lidocaine -prilocaine  (EMLA ) cream Apply 1 Application topically as needed. 08/17/23  Yes Sherrod Sherrod, MD  omeprazole  (PRILOSEC) 40 MG capsule Take 40 mg by mouth daily.   Yes [provider]  polyethylene  glycol (MIRALAX ) 17 g packet Take 17 g by mouth daily as needed for moderate constipation or severe constipation (not helped with stool softner). 10/11/23  Yes [name], FNP    Thank you  Mancel Ply, MD [facility] Pager: see AMION

## 2023-11-09 NOTE — ED Notes (Signed)
 RT coming to assess patient

## 2023-11-09 NOTE — Plan of Care (Signed)

## 2023-11-09 NOTE — Telephone Encounter (Signed)
 Received a phone call from the patient's wife notifying that the patient is currently hospitalized and will not be attending his scheduled infusion today. She stated that during the patient's 48-month check-up with his PCP yesterday, he was referred to the ER due to experiencing "difficulty breathing and spitting up some blood." Advised the wife to contact our office once the patient is discharged so we can assist with rescheduling the infusion appointment. Wife voiced understanding.

## 2023-11-09 NOTE — ED Notes (Signed)
 Echo at bedside

## 2023-11-09 NOTE — Progress Notes (Signed)
 PROGRESS NOTE  Stephen Meza FMW:[id number] DOB: 1933/05/05   PCP: [name], FNP  Patient is from: Home.  Lives with family.  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  Admitted with working diagnosis of radiation pneumonitis and possible infectious pneumonia.  Initially required 2 to 4 L by [place] but later had increasing oxygen requirement requiring 60 L by HFNC with 70% FiO2.  CT chest with interval development of multilobar large areas of new ground glass attenuation, interlobar septal thickening cystic change/bronchiectasis.  RVP returned positive for rhinovirus.  PCCM consulted and recommended steroid and CAP coverage.  Subjective: Seen and examined earlier this morning.  No major events overnight or this morning.  Patient's wife and daughter at bedside.  Patient states his breathing is about the same.  Still with productive cough with blood-tinged sputum.  Denies chest pain.   Objective: Vitals:   11/09/23 0915 11/09/23 0945 11/09/23 1045 11/09/23 1130  BP: 114/73 (!) 136/90 118/74   Pulse: 98 96 90   Resp: (!) 33 20 (!) 25   Temp:    98 F (36.7 C)  TempSrc:      SpO2: 95% 98% 96%   Weight:      Height:        Examination:  GENERAL: No apparent distress.  Nontoxic. HEENT: MMM.  Vision and hearing grossly intact.  NECK: Supple.  No apparent JVD.  RESP: On HFNC.  No IWOB.  Bibasilar  crackles. CVS:  RRR. Heart sounds normal.  ABD/GI/GU: BS+. Abd soft, NTND.  MSK/EXT:  Moves extremities. No apparent deformity.  Trace BLE edema. SKIN: no apparent skin lesion or wound NEURO: AA.  Oriented appropriately.  No apparent focal neuro deficit. PSYCH: Calm. Normal affect.   Consultants:  Pulmonology  Procedures: None  Microbiology summarized: COVID-19, influenza and RSV PCR nonreactive A-20 pathogen RVP positive for rhinovirus Blood cultures NGTD  Assessment and plan: Acute hypoxemic respiratory failure due to rhinovirus ARDS/ALI: Remains on 40 L HFNC with 65% FiO2.  Exam with bibasilar crackles. -Appreciate help by PCCM-continue Zithromax .  Changed prednisone  to hydrocortisone  -Continue nebulizers -Continue aspirin .  Plavix  on hold due to hemoptysis. -Continue PPI -Wean oxygen as able.  Recurrent syncope due to the above?  Due to dehydration? -Follow echocardiogram. -Orthostatic vitals at some point.   Hyponatremia: Improved with IV fluid. -Continue monitoring  Mild lactic acidosis: Likely due to respiratory failure.  Resolved.  Elevated troponin: Likely demand ischemia in the setting of respiratory failure.  No chest pain.  No acute ischemic finding on EKG. -Follow echocardiogram  Difficulty emptying bladder/suprapubic discomfort: Carries history of prostate cancer/bladder cancer and BPH. -Start Flomax    Paroxysmal A-fib: Rate controlled.  Currently in sinus rhythm.  Not on AC. -Continue Coreg . -Optimize electrolytes   History of CVA and PVD and carotid artery stent in 04/2023 -Plavix  on hold -Continue low-dose aspirin    Stage IIIb SQCC s/p chemoradiation: -Plan to start immunotherapy outpatient   Gout -Continue allopurinol .  Anemia of chronic disease: Relatively stable -Continue monitoring  Hypokalemia -Monitor replenish K and Mg as appropriate   GERD -Continue PPI.  Body mass index is 20.63 kg/m.          DVT prophylaxis:  SCDs  Start: 11/09/23 0048  Code Status: DNR Family Communication: Updated patient's wife and daughter at bedside Level of care: Progressive Status is: Inpatient Remains inpatient appropriate because: Acute respiratory failure with hypoxia/ARDS   Final disposition: To be determined   55 minutes with more than 50% spent in reviewing records, counseling patient/family and coordinating care.   Sch Meds:  Scheduled Meds:  allopurinol   100 mg Oral Daily   aspirin  EC  81 mg Oral Daily   [START ON 11/10/2023] azithromycin   500 mg Oral Daily   carvedilol   3.125 mg Oral BID WC   ezetimibe   10 mg Oral Daily   hydrocortisone  sod succinate (SOLU-CORTEF ) inj  100 mg Intravenous Q8H   insulin  aspart  2-6 Units Subcutaneous Q4H   pantoprazole   40 mg Oral Daily   Continuous Infusions: PRN Meds:.acetaminophen  **OR** acetaminophen , benzonatate , ipratropium-albuterol   Antimicrobials: Anti-infectives (From admission, onward)    Start     Dose/Rate Route Frequency Ordered Stop   11/10/23 1000  azithromycin  (ZITHROMAX ) tablet 500 mg        500 mg Oral Daily 11/09/23 0827 11/12/23 0959   11/09/23 2200  cefTRIAXone  (ROCEPHIN ) 1 g in sodium chloride  0.9 % 100 mL IVPB  Status:  Discontinued        1 g 200 mL/hr over 30 Minutes Intravenous Every 24 hours 11/09/23 0125 11/09/23 0959   11/09/23 0200  azithromycin  (ZITHROMAX ) 500 mg in sodium chloride  0.9 % 250 mL IVPB  Status:  Discontinued        500 mg 250 mL/hr over 60 Minutes Intravenous Every 24 hours 11/09/23 0125 11/09/23 0827   11/09/23 0130  vancomycin  (VANCOREADY) IVPB 1500 mg/300 mL        1,500 mg 150 mL/hr over 120 Minutes Intravenous  Once 11/09/23 0125 11/09/23 0543   11/09/23 0130  cefTRIAXone  (ROCEPHIN ) 1 g in sodium chloride  0.9 % 100 mL IVPB        1 g 200 mL/hr over 30 Minutes Intravenous  Once 11/09/23 0127 11/09/23 0322        I have personally reviewed the following labs and images: CBC: Recent Labs  Lab 11/08/23 1716  WBC  9.0  NEUTROABS 8.0*  HGB 8.2*  HCT 27.0*  MCV 96.1  PLT 213   BMP &GFR Recent Labs  Lab 11/08/23 1716 11/09/23 0413  NA 126* 131*  K 3.8 3.2*  CL 95* 101  CO2 19* 19*  GLUCOSE 141* 117*  BUN 29* 19  CREATININE 0.79 0.69  CALCIUM  8.0* 7.9*   Estimated Creatinine Clearance: 62.3 mL/min (by C-G formula based on SCr of 0.69 mg/dL). Liver & Pancreas: Recent Labs  Lab 11/08/23 1716  AST 36  ALT 30  ALKPHOS 81  BILITOT 0.5  PROT 5.9*  ALBUMIN  2.3*   No results for input(s): LIPASE, AMYLASE in the last 168 hours. No results for input(s): AMMONIA in the last 168 hours. Diabetic: No results for input(s): HGBA1C in the last 72 hours. Recent Labs  Lab 11/09/23 0529 11/09/23 0802 11/09/23 1147  GLUCAP 126* 136* 146*   Cardiac Enzymes: No results for input(s): CKTOTAL, CKMB, CKMBINDEX, TROPONINI in the last 168 hours. No results for input(s): PROBNP in the last 8760 hours. Coagulation Profile: Recent Labs  Lab  11/08/23 1716  INR 1.3*   Thyroid  Function Tests: No results for input(s): TSH, T4TOTAL, FREET4, T3FREE, THYROIDAB in the last 72 hours. Lipid Profile: No results for input(s): CHOL, HDL, LDLCALC, TRIG, CHOLHDL, LDLDIRECT in the last 72 hours. Anemia Panel: No results for input(s): VITAMINB12, FOLATE, FERRITIN, TIBC, IRON , RETICCTPCT in the last 72 hours. Urine analysis:    Component Value Date/Time   COLORURINE YELLOW 11/08/2023 2319   APPEARANCEUR CLEAR 11/08/2023 2319   LABSPEC 1.023 11/08/2023 2319   PHURINE 6.0 11/08/2023 2319   GLUCOSEU NEGATIVE 11/08/2023 2319   GLUCOSEU NEGATIVE 05/21/2023 1448   HGBUR NEGATIVE 11/08/2023 2319   BILIRUBINUR NEGATIVE 11/08/2023 2319   KETONESUR NEGATIVE 11/08/2023 2319   PROTEINUR NEGATIVE 11/08/2023 2319   UROBILINOGEN 1.0 05/21/2023 1448   NITRITE NEGATIVE 11/08/2023 2319   LEUKOCYTESUR NEGATIVE 11/08/2023 2319   Sepsis Labs: Invalid input(s):  PROCALCITONIN, LACTICIDVEN  Microbiology: Recent Results (from the past 240 hours)  Culture, blood (Routine x 2)     Status: None (Preliminary result)   Collection Time: 11/08/23  5:15 PM   Specimen: BLOOD  Result Value Ref Range Status   Specimen Description BLOOD SITE NOT SPECIFIED  Final   Special Requests   Final    BOTTLES DRAWN AEROBIC AND ANAEROBIC Blood Culture results may not be optimal due to an inadequate volume of blood received in culture bottles   Culture   Final    NO GROWTH < 24 HOURS Performed at [facility], [address] [place], [place] [address]    Report Status PENDING  Incomplete  Culture, blood (Routine x 2)     Status: None (Preliminary result)   Collection Time: 11/08/23  5:28 PM   Specimen: BLOOD  Result Value Ref Range Status   Specimen Description BLOOD SITE NOT SPECIFIED  Final   Special Requests   Final    BOTTLES DRAWN AEROBIC AND ANAEROBIC Blood Culture results may not be optimal due to an inadequate volume of blood received in culture bottles   Culture   Final    NO GROWTH < 24 HOURS Performed at [facility], [address], [place], [place] [address]    Report Status PENDING  Incomplete  Resp panel by RT-PCR (RSV, Flu A&B, Covid) Anterior Nasal Swab     Status: None   Collection Time: 11/09/23  3:29 AM   Specimen: Anterior Nasal Swab  Result Value Ref Range Status   SARS Coronavirus 2 by RT PCR NEGATIVE NEGATIVE Final   Influenza A by PCR NEGATIVE NEGATIVE Final   Influenza B by PCR NEGATIVE NEGATIVE Final    Comment: (NOTE) The Xpert Xpress SARS-CoV-2/FLU/RSV plus assay is intended as an aid in the diagnosis of influenza from Nasopharyngeal swab specimens and should not be used as a sole basis for treatment. Nasal washings and aspirates are unacceptable for Xpert Xpress SARS-CoV-2/FLU/RSV testing.  Fact Sheet for Patients: BloggerCourse.com  Fact Sheet for Healthcare  Providers: SeriousBroker.it  This test is not yet approved or cleared by the [place]  FDA and has been authorized for detection and/or diagnosis of SARS-CoV-2 by FDA under an Emergency Use Authorization (EUA). This EUA will remain in effect (meaning this test can be used) for the duration of the COVID-19 declaration under Section 564(b)(1) of the Act, 21 U.S.C. section 360bbb-3(b)(1), unless the authorization is terminated or revoked.     Resp Syncytial Virus by PCR NEGATIVE NEGATIVE Final    Comment: (NOTE) Fact Sheet for Patients: BloggerCourse.com  Fact Sheet for Healthcare Providers: SeriousBroker.it  This test is not yet approved or cleared by the [place]  FDA and has been authorized for detection and/or diagnosis of SARS-CoV-2 by FDA under an Emergency Use Authorization (EUA). This EUA will remain in effect (meaning this test can be used) for the duration of the COVID-19 declaration under Section 564(b)(1) of the Act, 21 U.S.C. section 360bbb-3(b)(1), unless the authorization is terminated or revoked.  Performed at [facility], [address] [place], [place] [address]   Respiratory (~20 pathogens) panel by PCR     Status: Abnormal   Collection Time: 11/09/23  3:29 AM   Specimen: Nasopharyngeal Swab; Respiratory  Result Value Ref Range Status   Adenovirus NOT DETECTED NOT DETECTED Final   Coronavirus 229E NOT DETECTED NOT DETECTED Final    Comment: (NOTE) The Coronavirus on the Respiratory Panel, DOES NOT test for the novel  Coronavirus (2019 nCoV)    Coronavirus HKU1 NOT DETECTED NOT DETECTED Final   Coronavirus NL63 NOT DETECTED NOT DETECTED Final   Coronavirus OC43 NOT DETECTED NOT DETECTED Final   Metapneumovirus NOT DETECTED NOT DETECTED Final   Rhinovirus / Enterovirus DETECTED (A) NOT DETECTED Final   Influenza A NOT DETECTED NOT DETECTED Final   Influenza B NOT  DETECTED NOT DETECTED Final   Parainfluenza Virus 1 NOT DETECTED NOT DETECTED Final   Parainfluenza Virus 2 NOT DETECTED NOT DETECTED Final   Parainfluenza Virus 3 NOT DETECTED NOT DETECTED Final   Parainfluenza Virus 4 NOT DETECTED NOT DETECTED Final   Respiratory Syncytial Virus NOT DETECTED NOT DETECTED Final   Bordetella pertussis NOT DETECTED NOT DETECTED Final   Bordetella Parapertussis NOT DETECTED NOT DETECTED Final   Chlamydophila pneumoniae NOT DETECTED NOT DETECTED Final   Mycoplasma pneumoniae NOT DETECTED NOT DETECTED Final    Comment: Performed at [facility], [address], [place], [place] [address]  MRSA Next Gen by PCR, Nasal     Status: None   Collection Time: 11/09/23  4:57 AM  Result Value Ref Range Status   MRSA by PCR Next Gen NOT DETECTED NOT DETECTED Final    Comment: (NOTE) The GeneXpert MRSA Assay (FDA approved for NASAL specimens only), is one component of a comprehensive MRSA colonization surveillance program. It is not intended to diagnose MRSA infection nor to guide or monitor treatment for MRSA infections. Test performance is not FDA approved in patients less than 55 years old. Performed at [facility], [address], [place], [place] [address]     Radiology Studies: CT Angio Chest PE W and/or Wo Contrast Addendum Date: 11/08/2023 ADDENDUM REPORT: 11/08/2023 20:30 ADDENDUM: Critical Value/emergent results were called by telephone at the time of interpretation by Dr Duwaine Severs on Sept 15, 530 p.m. to provider Dr. Simon who verbally acknowledged these results. Electronically Signed   By: Megan  Zare M.D.   On: 11/08/2023 20:30   Result Date: 11/08/2023 CLINICAL DATA:  High probability for PE, non-small cell lung cancer. EXAM: CT ANGIOGRAPHY CHEST WITH CONTRAST TECHNIQUE: Multidetector CT imaging of the chest was performed using the standard protocol during bolus administration of intravenous contrast. Multiplanar CT image reconstructions and  MIPs were obtained to evaluate the vascular anatomy. RADIATION DOSE REDUCTION: This exam was performed according to the departmental dose-optimization program which includes automated exposure control, adjustment of the mA and/or kV according to patient size and/or use of iterative reconstruction technique. CONTRAST:  75mL OMNIPAQUE  IOHEXOL  350 MG/ML SOLN COMPARISON:  PET-CT August 11, 2023, chest CT October 26, 2023. FINDINGS: Cardiovascular: Many of the distal subsegmental branches predominantly in right lower lobe are under opacified possibility due to ongoing postradiation/posttreatment pneumonitis versus parenchymal infectious/inflammatory process. No large pulmonary embolism to the level of segmental branches. The heart size is enlarged. Pulmonary artery is dilated measuring up to 37 mm. 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 Right hepatic lobe calcification. Mediastinum/Nodes: Multi station lymphadenopathy index lymph node in right lower paratracheal measures 1.3 cm. Soft tissue in right hilar region may represent atelectasis/consolidation versus conglomerate lymph nodes Lungs/Pleura: Progressive ground-glass attenuation, interlobular septal thickening and now involving entire lobes, previously predominantly in right lower lobe. There is a interval increase in right-sided pleural effusion now small. The right hilar mass is grossly stable measures 4.3 x 5.4 cm, previously measured 3 x 5.6 cm. Upper Abdomen: Suggestive of prior granulomatous disease. Punctate calcification is also identified in the spleen. Left adrenal nodule measuring approximately 2.3 x 1.4 cm, stable. Right adrenal is slightly thickened. Hiatal hernia. Musculoskeletal: Multilevel degenerative changes of the spine. Right sixth and 8 rib healed fracture. DISH . Review of the MIP images confirms the above findings. IMPRESSION: Interval development of multi lobar large areas of new ground-glass attenuation, interlobular septal  thickening cystic changes/bronchiectasis most consistent with pulmonary fat embolism. Differentials include acute respiratory distress syndrome (ARDS) and posttreatment changes (postradiation/post chemotherapy pneumonitis in the presence of recent therapy). No large filling defect to the level of segmental arteries. Some filling defects in subsegmental branches may represent artifact however tiny emboli can not be excluded. Right hilar mass, grossly stable to prior consistent with known malignancy. Small right pleural effusion, slightly increased to prior. Left adrenal nodule stable to prior. Stable aneurysmal dilation of ascending aorta and enlarged pulmonary artery. https://radiopaedia.org/articles/pulmonary-fat-embolism?lang=us  Electronically Signed: By: Megan  Zare M.D. On: 11/08/2023 20:20      Jacinto Keil T. Tymeka Privette [facility]  If 7PM-7AM, please contact night-coverage [email or web address] 11/09/2023, 1:57 PM

## 2023-11-09 NOTE — Plan of Care (Signed)
 RN reported that patient's IV line has been infiltrated however patient is getting treatment with IV vancomycin  it is more than 3 cm infiltrated swelled and red. Discussed with pharmacy recommended IV hyaluronidase .  Implemented orders and continue ice pack.

## 2023-11-10 ENCOUNTER — Inpatient Hospital Stay (HOSPITAL_COMMUNITY)

## 2023-11-10 ENCOUNTER — Ambulatory Visit: Admitting: Family

## 2023-11-10 DIAGNOSIS — R7989 Other specified abnormal findings of blood chemistry: Secondary | ICD-10-CM

## 2023-11-10 DIAGNOSIS — M7989 Other specified soft tissue disorders: Secondary | ICD-10-CM

## 2023-11-10 DIAGNOSIS — R55 Syncope and collapse: Secondary | ICD-10-CM

## 2023-11-10 DIAGNOSIS — J9601 Acute respiratory failure with hypoxia: Secondary | ICD-10-CM | POA: Diagnosis not present

## 2023-11-10 DIAGNOSIS — J700 Acute pulmonary manifestations due to radiation: Secondary | ICD-10-CM | POA: Diagnosis not present

## 2023-11-10 LAB — CBC
HCT: 26 % — ABNORMAL LOW (ref 39.0–52.0)
Hemoglobin: 8.3 g/dL — ABNORMAL LOW (ref 13.0–17.0)
MCH: 29.4 pg (ref 26.0–34.0)
MCHC: 31.9 g/dL (ref 30.0–36.0)
MCV: 92.2 fL (ref 80.0–100.0)
Platelets: 216 K/uL (ref 150–400)
RBC: 2.82 MIL/uL — ABNORMAL LOW (ref 4.22–5.81)
RDW: 19.7 % — ABNORMAL HIGH (ref 11.5–15.5)
WBC: 11.2 K/uL — ABNORMAL HIGH (ref 4.0–10.5)
nRBC: 0 % (ref 0.0–0.2)

## 2023-11-10 LAB — RENAL FUNCTION PANEL
Albumin: 2.4 g/dL — ABNORMAL LOW (ref 3.5–5.0)
Anion gap: 10 (ref 5–15)
BUN: 27 mg/dL — ABNORMAL HIGH (ref 8–23)
CO2: 19 mmol/L — ABNORMAL LOW (ref 22–32)
Calcium: 8.3 mg/dL — ABNORMAL LOW (ref 8.9–10.3)
Chloride: 102 mmol/L (ref 98–111)
Creatinine, Ser: 0.77 mg/dL (ref 0.61–1.24)
GFR, Estimated: >60 mL/min (ref >60)
Glucose, Bld: 116 mg/dL — ABNORMAL HIGH (ref 70–99)
Phosphorus: 3.8 mg/dL (ref 2.5–4.6)
Potassium: 3.6 mmol/L (ref 3.5–5.1)
Sodium: 131 mmol/L — ABNORMAL LOW (ref 135–145)

## 2023-11-10 LAB — GLUCOSE, CAPILLARY
Glucose-Capillary: 113 mg/dL — ABNORMAL HIGH (ref 70–99)
Glucose-Capillary: 120 mg/dL — ABNORMAL HIGH (ref 70–99)
Glucose-Capillary: 130 mg/dL — ABNORMAL HIGH (ref 70–99)
Glucose-Capillary: 139 mg/dL — ABNORMAL HIGH (ref 70–99)
Glucose-Capillary: 142 mg/dL — ABNORMAL HIGH (ref 70–99)
Glucose-Capillary: 142 mg/dL — ABNORMAL HIGH (ref 70–99)
Glucose-Capillary: 161 mg/dL — ABNORMAL HIGH (ref 70–99)

## 2023-11-10 LAB — BLOOD GAS, ARTERIAL
Acid-base deficit: 0.9 mmol/L (ref 0.0–2.0)
Bicarbonate: 22 mmol/L (ref 20.0–28.0)
O2 Saturation: 99.1 %
Patient temperature: 36.9
pCO2 arterial: 31 mmHg — ABNORMAL LOW (ref 32–48)
pH, Arterial: 7.46 — ABNORMAL HIGH (ref 7.35–7.45)
pO2, Arterial: 170 mmHg — ABNORMAL HIGH (ref 83–108)

## 2023-11-10 LAB — C-REACTIVE PROTEIN: CRP: 12.1 mg/dL — ABNORMAL HIGH (ref <1.0)

## 2023-11-10 LAB — PHOSPHORUS: Phosphorus: 3.9 mg/dL (ref 2.5–4.6)

## 2023-11-10 LAB — MAGNESIUM: Magnesium: 1.9 mg/dL (ref 1.7–2.4)

## 2023-11-10 LAB — SEDIMENTATION RATE: Sed Rate: 81 mm/h — ABNORMAL HIGH (ref 0–16)

## 2023-11-10 MED ORDER — ORAL CARE MOUTH RINSE: 15.0000 mL | OROMUCOSAL | Status: DC | PRN

## 2023-11-10 MED ORDER — METHYLPREDNISOLONE SODIUM SUCC 125 MG IJ SOLR
125.0000 mg | Freq: Four times a day (QID) | INTRAMUSCULAR | Status: DC
Start: 2023-11-10 — End: 2023-11-10

## 2023-11-10 MED ORDER — LACTATED RINGERS IV BOLUS
500.0000 mL | INTRAVENOUS | Status: AC
  Administered 2023-11-10: 500 mL via INTRAVENOUS

## 2023-11-10 MED ORDER — METHYLPREDNISOLONE SODIUM SUCC 125 MG IJ SOLR
80.0000 mg | Freq: Four times a day (QID) | INTRAMUSCULAR | Status: DC
  Administered 2023-11-10 – 2023-11-11 (×5): 80 mg via INTRAVENOUS
  Filled 2023-11-10 (×5): qty 2

## 2023-11-10 MED ORDER — MAGNESIUM SULFATE 2 GM/50ML IV SOLN
2.0000 g | Freq: Once | INTRAVENOUS | Status: AC
  Administered 2023-11-10: 2 g via INTRAVENOUS
  Filled 2023-11-10: qty 50

## 2023-11-10 MED ORDER — ACETYLCYSTEINE 20 % IN SOLN
3.0000 mL | Freq: Three times a day (TID) | RESPIRATORY_TRACT | Status: DC
  Administered 2023-11-10 – 2023-11-13 (×11): 3 mL via RESPIRATORY_TRACT
  Filled 2023-11-10 (×13): qty 4

## 2023-11-10 MED ORDER — ENOXAPARIN SODIUM 80 MG/0.8ML IJ SOSY
70.0000 mg | PREFILLED_SYRINGE | Freq: Two times a day (BID) | INTRAMUSCULAR | Status: DC
  Administered 2023-11-10 – 2023-11-11 (×3): 70 mg via SUBCUTANEOUS
  Filled 2023-11-10 (×3): qty 0.8

## 2023-11-10 MED ORDER — LEVALBUTEROL HCL 0.63 MG/3ML IN NEBU: 0.6300 mg | INHALATION_SOLUTION | Freq: Four times a day (QID) | RESPIRATORY_TRACT | Status: DC | PRN

## 2023-11-10 MED ORDER — IPRATROPIUM BROMIDE 0.02 % IN SOLN
0.5000 mg | Freq: Four times a day (QID) | RESPIRATORY_TRACT | Status: DC
Start: 2023-11-10 — End: 2023-11-14
  Administered 2023-11-10 – 2023-11-13 (×15): 0.5 mg via RESPIRATORY_TRACT
  Filled 2023-11-10 (×16): qty 2.5

## 2023-11-10 MED ORDER — METOPROLOL TARTRATE 5 MG/5ML IV SOLN
2.5000 mg | INTRAVENOUS | Status: DC | PRN
Start: 2023-11-10 — End: 2023-11-13
  Administered 2023-11-10: 2.5 mg via INTRAVENOUS
  Filled 2023-11-10: qty 5

## 2023-11-10 MED ORDER — LEVALBUTEROL HCL 0.63 MG/3ML IN NEBU
0.6300 mg | INHALATION_SOLUTION | Freq: Four times a day (QID) | RESPIRATORY_TRACT | Status: DC
  Administered 2023-11-10 – 2023-11-13 (×14): 0.63 mg via RESPIRATORY_TRACT
  Filled 2023-11-10 (×15): qty 3

## 2023-11-10 MED ORDER — MIDODRINE HCL 5 MG PO TABS
5.0000 mg | ORAL_TABLET | Freq: Three times a day (TID) | ORAL | Status: DC
  Administered 2023-11-10 – 2023-11-11 (×7): 5 mg via ORAL
  Filled 2023-11-10 (×8): qty 1

## 2023-11-10 MED ORDER — SODIUM CHLORIDE 0.9 % IV SOLN: INTRAVENOUS | Status: AC

## 2023-11-10 MED ORDER — MELATONIN 5 MG PO TABS
5.0000 mg | ORAL_TABLET | Freq: Every evening | ORAL | Status: DC | PRN
  Administered 2023-11-10 – 2023-11-12 (×2): 5 mg via ORAL
  Filled 2023-11-10 (×2): qty 1

## 2023-11-10 MED ORDER — LEVALBUTEROL HCL 0.63 MG/3ML IN NEBU: 0.6300 mg | INHALATION_SOLUTION | Freq: Four times a day (QID) | RESPIRATORY_TRACT | Status: DC

## 2023-11-10 MED ORDER — LORAZEPAM 2 MG/ML IJ SOLN
0.5000 mg | Freq: Once | INTRAMUSCULAR | Status: AC
  Administered 2023-11-10: 0.5 mg via INTRAVENOUS
  Filled 2023-11-10: qty 1

## 2023-11-10 MED ORDER — ALBUMIN HUMAN 25 % IV SOLN
25.0000 g | INTRAVENOUS | Status: AC
Start: 2023-11-10 — End: 2023-11-10
  Administered 2023-11-10: 25 g via INTRAVENOUS
  Filled 2023-11-10: qty 100

## 2023-11-10 NOTE — Plan of Care (Signed)

## 2023-11-10 NOTE — TOC Initial Note (Signed)
 Transition of Care (TOC) - Initial/Assessment Note    Patient Details  Name: Stephen Meza MRN: [id number] Date of Birth: [birth year]  Transition of Care [facility]) CM/SW Contact:    Marval Gell, RN Phone Number: 11/10/2023, 8:41 AM  Clinical Narrative:                  Patient admitted from home.  WC bound at baseline, admitted with [facility], not oxygen dependent but requiring 40L HHFNC at 100% per last recorded flowsheet documentation.   TOC will continue to follow for transitional needs  Expected Discharge Plan: Skilled Nursing Facility Barriers to Discharge: Continued Medical Work up   Patient Goals and CMS Choice            Expected Discharge Plan and Services   Discharge Planning Services: CM Consult   Living arrangements for the past 2 months: Single Family Home                                      Prior Living Arrangements/Services Living arrangements for the past 2 months: Single Family Home Lives with:: Spouse                   Activities of Daily Living   ADL Screening (condition at time of admission) Independently performs ADLs?: No Does the patient have a NEW difficulty with bathing/dressing/toileting/self-feeding that is expected to last >3 days?: Yes (Initiates electronic notice to provider for possible OT consult) Does the patient have a NEW difficulty with getting in/out of bed, walking, or climbing stairs that is expected to last >3 days?: Yes (Initiates electronic notice to provider for possible PT consult) Does the patient have a NEW difficulty with communication that is expected to last >3 days?: No Is the patient deaf or have difficulty hearing?: No Does the patient have difficulty seeing, even when wearing glasses/contacts?: No Does the patient have difficulty concentrating, remembering, or making decisions?: No  Permission Sought/Granted                  Emotional Assessment              Admission diagnosis:   Radiation pneumonitis (HCC) [J70.0] Acute hypoxemic respiratory failure (HCC) [J96.01] Acute hypoxic respiratory failure (HCC) [J96.01] Patient Active Problem List   Diagnosis Date Noted   Syncope 11/09/2023   Elevated troponin 11/09/2023   Paroxysmal A-fib (HCC) 11/09/2023   Non-pressure chronic ulcer of other part of right foot limited to breakdown of skin (HCC) 11/08/2023   Acute hypoxemic respiratory failure (HCC) 11/08/2023   Hyponatremia 11/07/2023   Anemia of chronic disease 11/07/2023   Encounter for antineoplastic chemotherapy 08/31/2023   Port-A-Cath in place 08/30/2023   Malignant neoplasm of lower lobe of right lung (HCC) 08/08/2023   Weight loss 07/29/2023   Chronic right shoulder pain 07/29/2023   Lung nodule seen on imaging study 07/22/2023   Prediabetes 06/29/2023   Medication management 06/29/2023   Statin myopathy 06/18/2023   Chronic constipation 06/18/2023   Primary osteoarthritis of both knees 06/18/2023   Dysuria 06/18/2023   Chronic pain of both knees 06/18/2023   History of stroke 06/18/2023   Generalized weakness 06/18/2023   Acute CVA (cerebrovascular accident) (HCC) 05/10/2023   History of gout 05/10/2023   Stroke (HCC) 05/10/2023   Abnormal breath sounds 03/29/2023   Mass of right lung 03/22/2023   Irregular heart rate 11/10/2022  Anemia 05/03/2021   Tick bite, infected 08/07/2020   Peripheral neuropathy 04/06/2019   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   Low grade fever 04/06/2019   Bilateral leg pain 04/04/2018   Bilateral bunions 04/04/2018   Ganglion cyst of dorsum of right wrist 04/04/2018   Chronic low back pain 04/04/2018   Gait disorder 04/04/2018   Recurrent falls while walking 04/04/2018   Abrasion of knee, bilateral 04/04/2018   Esophageal stricture 03/19/2016   Retinal tear of right eye 03/19/2016   Dizziness 03/19/2016   Hyperglycemia 03/16/2015   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  12/15/2012   Mixed hyperlipidemia 04/12/2012   Encounter for well adult exam with abnormal findings 03/11/2011   TOBACCO ABUSE, HX OF 02/12/2010   SHOULDER PAIN 10/10/2007   Vitamin B12 deficiency 07/14/2007   PODAGRA 07/14/2007   ERECTILE DYSFUNCTION 07/14/2007   TREMOR, ESSENTIAL 07/14/2007   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   Essential hypertension 07/14/2007   PERIPHERAL VASCULAR DISEASE 07/14/2007   External hemorrhoids 07/14/2007   Esophagitis 07/14/2007   GERD 07/14/2007   TONSILLECTOMY, HX OF 07/14/2007   PCP:  [name], FNP Pharmacy:   [facility] [phone] - [place], [place] - [address] [place] [address] Phone: [phone] Fax: [phone]     Social Drivers of Health (SDOH) Social History: SDOH Screenings   Food Insecurity: Unknown (11/09/2023)  Housing: Unknown (11/09/2023)  Transportation Needs: Unknown (11/09/2023)  Utilities: Not At Risk (11/09/2023)  Alcohol Screen: Low Risk  (07/07/2023)  Depression (PHQ2-9): Low Risk  (11/02/2023)  Financial Resource Strain: Low Risk  (07/07/2023)  Physical Activity: Sufficiently Active (07/07/2023)  Social Connections: Socially Integrated (11/09/2023)  Stress: No Stress Concern Present (07/07/2023)  Tobacco Use: Medium Risk (11/08/2023)  Health Literacy: Adequate Health Literacy (07/07/2023)   SDOH Interventions:     Readmission Risk Interventions     No data to display

## 2023-11-10 NOTE — Progress Notes (Signed)
 PT Cancellation Note  Patient Details Name: Stephen Meza MRN: [id number] DOB: December 10, 1933   Cancelled Treatment:    Reason Eval/Treat Not Completed: Patient not medically ready - RN states to hold PT for today. Respiratory and HR complications prohibiting therapy. PT to check back in tomorrow.   Quintin Campi, SPT  Acute Rehab  [phone]   Quintin Campi 11/10/2023, 10:17 AM

## 2023-11-10 NOTE — Progress Notes (Signed)
 Venous duplex lower ext  has been completed. Refer to [facility] under chart review to view preliminary results.   11/10/2023  10:16 AM Stephen Meza, Ricka BIRCH

## 2023-11-10 NOTE — Progress Notes (Addendum)
 PROGRESS NOTE        PATIENT DETAILS Name: Stephen Meza Age: 89 y.o. Sex: male Date of Birth: 1933-11-07 Admit Date: 11/08/2023 Admitting Physician Editha Ram, MD ERE:Fjuuyztd, [name], FNP  Brief Summary: Patient is a 89 y.o.  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  Significant events: 9/15>> admit to [facility]  Significant studies: 915>> CT angio chest: Multilobar large areas of ground glass opacities-no large filling defect. 9/16>> echo: EF 55-60% 9/17>> B/L LE Doppler:+ DVT in RLE  Significant microbiology data: 9/15>> blood culture: No growth 9/16>> COVID/influenza/RSV PCR: Negative 9/16>> respiratory virus panel:+ve rhinovirus  Procedures: None  Consults: PCCM Palliative care  Subjective: I had delirium last night-had a brief episode of worsening hypoxemia needing NRB in addition to HHFNC  Objective: Vitals: Blood pressure 121/66, pulse 86, temperature 98.2 F (36.8 C), temperature source Oral, resp. rate (!) 31, height 6' (1.829 m), weight 69 kg, SpO2 95%.   Exam: Gen Exam: Frail-slightly tachypneic but not in distress HEENT:atraumatic, normocephalic Chest: Bibasilar rales CVS:S1S2 regular Abdomen:soft non tender, non distended Extremities:no edema Neurology: Non focal-no edema Skin: no rash  Pertinent Labs/Radiology:    Latest Ref Rng & Units 11/10/2023    3:46 AM 11/09/2023    5:13 PM 11/08/2023    5:16 PM  CBC  WBC 4.0 - 10.5 K/uL 11.2  9.9  9.0   Hemoglobin 13.0 - 17.0 g/dL 8.3  89.8  8.2   Hematocrit 39.0 - 52.0 % 26.0  32.6  27.0   Platelets 150 - 400 K/uL 216  249   213     Lab Results  Component Value Date   NA 131 (L) 11/10/2023   K 3.6 11/10/2023   CL 102 11/10/2023   CO2 19 (L) 11/10/2023      Assessment/Plan: Acute hypoxic respiratory failure with ARDS secondary to rhinovirus pneumonia +/- small pulm embolism not seen on CTA chest (has RLE DVT on Doppler) Remains hypoxic-required both NRB/HHFNC last night-this morning back on chest HHFNC-100% at 40 L/min.   Continue IV steroids Continue empiric antibiotics Volume status stable-does not require antibiotics Extensive discussion with spouse at bedside this morning and then with 2 daughters later-DNR-family aware that there is really no further role in escalation in care-if he deteriorates any further-apart from hospice care nothing else to offer.  Family contemplating whether they would like to transition to comfort measures-other family members will be arriving.  Palliative care consulted.  RLE DVT Starting full dose Lovenox  Follow  Syncope Possibly orthostatic/vasovagal mechanism in the setting of infection/coughing/dehydration-but with DVT-small transient PE also possible. 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  HTN BP soft this morning-hypotensive overnight Hold Coreg  Has been started on midodrine  overnight  Minimally elevated troponin-likely type II non-STEMI/demand ischemia Supportive care Echo stable Doubt further  workup required  Gout Not in flare Continue allopurinol   GERD PPI  Advanced directive/palliative care DNR already in place Long discussion with spouse/2 daughters x 2 this morning-poor prognosis-unclear that even  if patient survives this hospitalization-not sure if he will be back to his prior baseline (significant decline in function since hisCVA earlier this year) Family aware that there is really no further escalation in care-family discussion in progress-Will allow times for discussion and also allow time for clinical outcome.  However if in the interim-if he were to deteriorate-family okay to transition to comfort measures.  Have consulted palliative care as well.   The patient is critically ill with multiple organ system failure and requires high complexity decision making for assessment and support, frequent evaluation and titration of therapies, advanced monitoring, review of radiographic studies and interpretation of complex data.   Total Critical time spent equals 45 minutes  Code status:   Code Status: Limited: Do not attempt resuscitation (DNR) -DNR-LIMITED -Do Not Intubate/DNI    DVT Prophylaxis: SCDs Start: 11/09/23 0048 Full dose Lovenox    Family Communication: Spouse/2 daughters at bedside   Disposition Plan: Status is: Inpatient Remains inpatient appropriate because: Severe illness   Planned Discharge Destination: TBD remained   Diet: Diet Order             DIET DYS 3 Room service appropriate? Yes; Fluid consistency: Thin  Diet effective now                     Antimicrobial agents: Anti-infectives (From admission, onward)    Start     Dose/Rate Route Frequency Ordered Stop   11/10/23 1000  azithromycin  (ZITHROMAX ) tablet 500 mg        500 mg Oral Daily 11/09/23 0827 11/12/23 0959   11/09/23 2200  cefTRIAXone  (ROCEPHIN ) 1 g in sodium chloride  0.9 % 100 mL IVPB  Status:  Discontinued        1 g 200 mL/hr over 30 Minutes Intravenous Every 24 hours 11/09/23 0125 11/09/23 0959   11/09/23 0200  azithromycin  (ZITHROMAX ) 500 mg in sodium chloride  0.9 % 250 mL IVPB  Status:  Discontinued        500 mg 250 mL/hr over 60 Minutes Intravenous Every 24 hours 11/09/23  0125 11/09/23 0827   11/09/23 0130  vancomycin  (VANCOREADY) IVPB 1500 mg/300 mL        1,500 mg 150 mL/hr over 120 Minutes Intravenous  Once 11/09/23 0125 11/09/23 0543   11/09/23 0130  cefTRIAXone  (ROCEPHIN ) 1 g in sodium chloride  0.9 % 100 mL IVPB        1 g 200 mL/hr over 30 Minutes Intravenous  Once 11/09/23 0127 11/09/23 0322        MEDICATIONS: Scheduled Meds:  acetylcysteine   3 mL Nebulization TID   allopurinol   100 mg Oral Daily   aspirin  EC  81 mg Oral Daily   azithromycin   500 mg Oral Daily   carvedilol   3.125 mg Oral BID WC   enoxaparin  (LOVENOX ) injection  70 mg Subcutaneous Q12H   ezetimibe   10 mg Oral Daily   insulin  aspart  2-6 Units Subcutaneous Q4H   ipratropium  0.5 mg Nebulization Q6H   levalbuterol   0.63 mg Nebulization Q6H   methylPREDNISolone  (SOLU-MEDROL ) injection  80 mg Intravenous Q6H   midodrine   5 mg Oral TID WC   pantoprazole   40 mg Oral Daily   Continuous Infusions: PRN Meds:.acetaminophen  **OR** acetaminophen , benzonatate , melatonin, metoprolol  tartrate   I have personally  reviewed following labs and imaging studies  LABORATORY DATA: CBC: Recent Labs  Lab 11/08/23 1716 11/09/23 1713 11/10/23 0346  WBC 9.0 9.9 11.2*  NEUTROABS 8.0*  --   --   HGB 8.2* 10.1* 8.3*  HCT 27.0* 32.6* 26.0*  MCV 96.1 93.4 92.2  PLT 213 249 216    Basic Metabolic Panel: Recent Labs  Lab 11/08/23 1716 11/09/23 0413 11/09/23 1713 11/10/23 0346  NA 126* 131* 132* 131*  K 3.8 3.2* 4.4 3.6  CL 95* 101 100 102  CO2 19* 19* 22 19*  GLUCOSE 141* 117* 145* 116*  BUN 29* 19 20 27*  CREATININE 0.79 0.69 0.73 0.77  CALCIUM  8.0* 7.9* 8.7* 8.3*  MG  --   --  2.1 1.9  PHOS  --   --  3.7 3.9  3.8    GFR: Estimated Creatinine Clearance: 62.3 mL/min (by C-G formula based on SCr of 0.77 mg/dL).  Liver Function Tests: Recent Labs  Lab 11/08/23 1716 11/10/23 0346  AST 36  --   ALT 30  --   ALKPHOS 81  --   BILITOT 0.5  --   PROT 5.9*  --   ALBUMIN   2.3* 2.4*   No results for input(s): LIPASE, AMYLASE in the last 168 hours. No results for input(s): AMMONIA in the last 168 hours.  Coagulation Profile: Recent Labs  Lab 11/08/23 1716  INR 1.3*    Cardiac Enzymes: No results for input(s): CKTOTAL, CKMB, CKMBINDEX, TROPONINI in the last 168 hours.  BNP (last 3 results) No results for input(s): PROBNP in the last 8760 hours.  Lipid Profile: No results for input(s): CHOL, HDL, LDLCALC, TRIG, CHOLHDL, LDLDIRECT in the last 72 hours.  Thyroid  Function Tests: No results for input(s): TSH, T4TOTAL, FREET4, T3FREE, THYROIDAB in the last 72 hours.  Anemia Panel: No results for input(s): VITAMINB12, FOLATE, FERRITIN, TIBC, IRON , RETICCTPCT in the last 72 hours.  Urine analysis:    Component Value Date/Time   COLORURINE YELLOW 11/08/2023 2319   APPEARANCEUR CLEAR 11/08/2023 2319   LABSPEC 1.023 11/08/2023 2319   PHURINE 6.0 11/08/2023 2319   GLUCOSEU NEGATIVE 11/08/2023 2319   GLUCOSEU NEGATIVE 05/21/2023 1448   HGBUR NEGATIVE 11/08/2023 2319   BILIRUBINUR NEGATIVE 11/08/2023 2319   KETONESUR NEGATIVE 11/08/2023 2319   PROTEINUR NEGATIVE 11/08/2023 2319   UROBILINOGEN 1.0 05/21/2023 1448   NITRITE NEGATIVE 11/08/2023 2319   LEUKOCYTESUR NEGATIVE 11/08/2023 2319    Sepsis Labs: Lactic Acid, Venous    Component Value Date/Time   LATICACIDVEN 1.2 11/08/2023 2331    MICROBIOLOGY: Recent Results (from the past 240 hours)  Culture, blood (Routine x 2)     Status: None (Preliminary result)   Collection Time: 11/08/23  5:15 PM   Specimen: BLOOD  Result Value Ref Range Status   Specimen Description BLOOD SITE NOT SPECIFIED  Final   Special Requests   Final    BOTTLES DRAWN AEROBIC AND ANAEROBIC Blood Culture results may not be optimal due to an inadequate volume of blood received in culture bottles   Culture   Final    NO GROWTH 2 DAYS Performed at [facility],  [address], [place], [place] [address]    Report Status PENDING  Incomplete  Culture, blood (Routine x 2)     Status: None (Preliminary result)   Collection Time: 11/08/23  5:28 PM   Specimen: BLOOD  Result Value Ref Range Status   Specimen Description BLOOD SITE NOT SPECIFIED  Final  Special Requests   Final    BOTTLES DRAWN AEROBIC AND ANAEROBIC Blood Culture results may not be optimal due to an inadequate volume of blood received in culture bottles   Culture   Final    NO GROWTH 2 DAYS Performed at [facility], [address], [place], [place] [address]    Report Status PENDING  Incomplete  Resp panel by RT-PCR (RSV, Flu A&B, Covid) Anterior Nasal Swab     Status: None   Collection Time: 11/09/23  3:29 AM   Specimen: Anterior Nasal Swab  Result Value Ref Range Status   SARS Coronavirus 2 by RT PCR NEGATIVE NEGATIVE Final   Influenza A by PCR NEGATIVE NEGATIVE Final   Influenza B by PCR NEGATIVE NEGATIVE Final    Comment: (NOTE) The Xpert Xpress SARS-CoV-2/FLU/RSV plus assay is intended as an aid in the diagnosis of influenza from Nasopharyngeal swab specimens and should not be used as a sole basis for treatment. Nasal washings and aspirates are unacceptable for Xpert Xpress SARS-CoV-2/FLU/RSV testing.  Fact Sheet for Patients: BloggerCourse.com  Fact Sheet for Healthcare Providers: SeriousBroker.it  This test is not yet approved or cleared by the [place]  FDA and has been authorized for detection and/or diagnosis of SARS-CoV-2 by FDA under an Emergency Use Authorization (EUA). This EUA will remain in effect (meaning this test can be used) for the duration of the COVID-19 declaration under Section 564(b)(1) of the Act, 21 U.S.C. section 360bbb-3(b)(1), unless the authorization is terminated or revoked.     Resp Syncytial Virus by PCR NEGATIVE NEGATIVE Final    Comment: (NOTE) Fact Sheet for  Patients: BloggerCourse.com  Fact Sheet for Healthcare Providers: SeriousBroker.it  This test is not yet approved or cleared by the [place]  FDA and has been authorized for detection and/or diagnosis of SARS-CoV-2 by FDA under an Emergency Use Authorization (EUA). This EUA will remain in effect (meaning this test can be used) for the duration of the COVID-19 declaration under Section 564(b)(1) of the Act, 21 U.S.C. section 360bbb-3(b)(1), unless the authorization is terminated or revoked.  Performed at [facility], [address] [place], [place] [address]   Respiratory (~20 pathogens) panel by PCR     Status: Abnormal   Collection Time: 11/09/23  3:29 AM   Specimen: Nasopharyngeal Swab; Respiratory  Result Value Ref Range Status   Adenovirus NOT DETECTED NOT DETECTED Final   Coronavirus 229E NOT DETECTED NOT DETECTED Final    Comment: (NOTE) The Coronavirus on the Respiratory Panel, DOES NOT test for the novel  Coronavirus (2019 nCoV)    Coronavirus HKU1 NOT DETECTED NOT DETECTED Final   Coronavirus NL63 NOT DETECTED NOT DETECTED Final   Coronavirus OC43 NOT DETECTED NOT DETECTED Final   Metapneumovirus NOT DETECTED NOT DETECTED Final   Rhinovirus / Enterovirus DETECTED (A) NOT DETECTED Final   Influenza A NOT DETECTED NOT DETECTED Final   Influenza B NOT DETECTED NOT DETECTED Final   Parainfluenza Virus 1 NOT DETECTED NOT DETECTED Final   Parainfluenza Virus 2 NOT DETECTED NOT DETECTED Final   Parainfluenza Virus 3 NOT DETECTED NOT DETECTED Final   Parainfluenza Virus 4 NOT DETECTED NOT DETECTED Final   Respiratory Syncytial Virus NOT DETECTED NOT DETECTED Final   Bordetella pertussis NOT DETECTED NOT DETECTED Final   Bordetella Parapertussis NOT DETECTED NOT DETECTED Final   Chlamydophila pneumoniae NOT DETECTED NOT DETECTED Final   Mycoplasma pneumoniae NOT DETECTED NOT DETECTED Final    Comment: Performed at  [facility], [address], [place], [place] [address]  MRSA Next Gen by PCR, Nasal     Status: None   Collection Time: 11/09/23  4:57 AM  Result Value Ref Range Status   MRSA by PCR Next Gen NOT DETECTED NOT DETECTED Final    Comment: (NOTE) The GeneXpert MRSA Assay (FDA approved for NASAL specimens only), is one component of a comprehensive MRSA colonization surveillance program. It is not intended to diagnose MRSA infection nor to guide or monitor treatment for MRSA infections. Test performance is not FDA approved in patients less than 11 years old. Performed at [facility], [address], [place], [place] [address]     RADIOLOGY STUDIES/RESULTS: VAS US  LOWER EXTREMITY VENOUS (DVT) Result Date: 11/10/2023  Lower Venous DVT Study Patient Name:  JENEL RONAL PRETTY  Date of Exam:   11/10/2023 Medical Rec #: [id number]             Accession #:    [id number] Date of Birth: 1933/10/16             Patient Gender: M Patient Age:   61 years Exam Location:  [facility] Procedure:      VAS US  LOWER EXTREMITY VENOUS (DVT) Referring Phys: DONALDA APPLEBAUM --------------------------------------------------------------------------------  Indications: Swelling, SOB, and History of lung mass.  Risk Factors: CAD - Afib. Comparison Study: No priors. Performing Technologist: Ricka Sturdivant-Jones RDMS, RVT  Examination Guidelines: A complete evaluation includes B-mode imaging, spectral Doppler, color Doppler, and power Doppler as needed of all accessible portions of each vessel. Bilateral testing is considered an integral part of a complete examination. Limited examinations for reoccurring indications may be performed as noted. The reflux portion of the exam is performed with the patient in reverse Trendelenburg.  +---------+---------------+---------+-----------+----------+--------------+ RIGHT    CompressibilityPhasicitySpontaneityPropertiesThrombus Aging  +---------+---------------+---------+-----------+----------+--------------+ CFV      Full           Yes      Yes                                 +---------+---------------+---------+-----------+----------+--------------+ SFJ      Full                                                        +---------+---------------+---------+-----------+----------+--------------+ FV Prox  Full                                                        +---------+---------------+---------+-----------+----------+--------------+ FV Mid   Full                                                        +---------+---------------+---------+-----------+----------+--------------+ FV DistalFull                                                        +---------+---------------+---------+-----------+----------+--------------+  PFV      Full                                                        +---------+---------------+---------+-----------+----------+--------------+ POP      Full                                                        +---------+---------------+---------+-----------+----------+--------------+ PTV      Partial        No       No                   Acute          +---------+---------------+---------+-----------+----------+--------------+ PERO     Partial        No       No                   Acute          +---------+---------------+---------+-----------+----------+--------------+ Acute DVT in the proximal segment only.  +---------+---------------+---------+-----------+----------+--------------+ LEFT     CompressibilityPhasicitySpontaneityPropertiesThrombus Aging +---------+---------------+---------+-----------+----------+--------------+ CFV      Full           Yes      Yes                                 +---------+---------------+---------+-----------+----------+--------------+ SFJ      None           No       No                   Acute           +---------+---------------+---------+-----------+----------+--------------+ FV Prox  Full                                                        +---------+---------------+---------+-----------+----------+--------------+ FV Mid   Full                                                        +---------+---------------+---------+-----------+----------+--------------+ FV DistalFull                                                        +---------+---------------+---------+-----------+----------+--------------+ PFV      Full                                                        +---------+---------------+---------+-----------+----------+--------------+ POP  Full           Yes      Yes                                 +---------+---------------+---------+-----------+----------+--------------+ PTV      None           No       No                   Acute          +---------+---------------+---------+-----------+----------+--------------+ PERO     Full                                                        +---------+---------------+---------+-----------+----------+--------------+ GSV      None           No       No                   Acute          +---------+---------------+---------+-----------+----------+--------------+ Superficial vein thrombosis in the proximal GSV less than 2 cm from the SFJ and proximal thigh.  Summary: RIGHT: - Findings consistent with acute deep vein thrombosis involving the right posterior tibial veins, and right peroneal veins.  - No cystic structure found in the popliteal fossa.  LEFT: - Findings consistent with acute deep vein thrombosis involving the left posterior tibial veins. - Findings consistent with acute superficial vein thrombosis involving the left great saphenous vein.  - No cystic structure found in the popliteal fossa.  *See table(s) above for measurements and observations.    Preliminary    DG Chest Port 1V same  Day Result Date: 11/10/2023 CLINICAL DATA:  Shortness of breath. EXAM: PORTABLE CHEST 1 VIEW COMPARISON:  Chest CT 11/08/2023 FINDINGS: Patient is rotated to the right. The cardio pericardial silhouette is enlarged. Asymmetric diffuse patchy airspace disease noted without substantial pleural effusion. Left Port-A-Cath evident with distal tip overlying the region of the distal SVC near the RA junction. Age indeterminate anterior right rib fractures evident. Telemetry leads overlie the chest. IMPRESSION: Asymmetric diffuse patchy airspace disease. Imaging features compatible with multifocal pneumonia or asymmetric edema. Electronically Signed   By: Camellia Candle M.D.   On: 11/10/2023 08:18   ECHOCARDIOGRAM COMPLETE Result Date: 11/09/2023    ECHOCARDIOGRAM REPORT   Patient Name:   JENEL RONAL PRETTY Date of Exam: 11/09/2023 Medical Rec #:  [id number]            Height:       72.0 in Accession #:    [id number]           Weight:       152.1 lb Date of Birth:  [birth year]            BSA:          1.897 m Patient Age:    89 years             BP:           118/74 mmHg Patient Gender: M                    HR:           89  bpm. Exam Location:  Inpatient Procedure: 2D Echo (Both Spectral and Color Flow Doppler were utilized during            procedure). Indications:    syncope  History:        Patient has prior history of Echocardiogram examinations, most                 recent 05/11/2023. Lung cancer, Arrythmias:P-Afib,                 Signs/Symptoms:Shortness of Breath; Risk Factors:Former Smoker.  Sonographer:    Tinnie Barefoot RDCS Referring Phys: [id number] RATHORE  Sonographer Comments: Image acquisition challenging due to respiratory motion. Difficult to evaluate MR due to respiratory interference IMPRESSIONS  1. Left ventricular ejection fraction, by estimation, is 55 to 60%. The left ventricle has normal function. The left ventricle has no regional wall motion abnormalities. Left ventricular diastolic  parameters are indeterminate.  2. Right ventricular systolic function is moderately reduced. The right ventricular size is normal. There is mildly elevated pulmonary artery systolic pressure. The estimated right ventricular systolic pressure is 44.5 mmHg.  3. The mitral valve is normal in structure. Mild to moderate mitral valve regurgitation. No evidence of mitral stenosis.  4. The aortic valve has an indeterminant number of cusps. Aortic valve regurgitation is not visualized. No aortic stenosis is present.  5. Aortic dilatation noted. 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  6. The inferior vena cava is normal in size with greater than 50% respiratory variability, suggesting right atrial pressure of 3 mmHg. FINDINGS  Left Ventricle: Left ventricular ejection fraction, by estimation, is 55 to 60%. The left ventricle has normal function. The left ventricle has no regional wall motion abnormalities. The left ventricular internal cavity size was normal in size. There is  no left ventricular hypertrophy. Left ventricular diastolic parameters are indeterminate. Right Ventricle: The right ventricular size is normal. No increase in right ventricular wall thickness. Right ventricular systolic function is moderately reduced. There is mildly elevated pulmonary artery systolic pressure. The tricuspid regurgitant velocity is 3.22 m/s, and with an assumed right atrial pressure of 3 mmHg, the estimated right ventricular systolic pressure is 44.5 mmHg. Left Atrium: Left atrial size was normal in size. Right Atrium: Right atrial size was normal in size. Pericardium: There is no evidence of pericardial effusion. Mitral Valve: The mitral valve is normal in structure. Mild to moderate mitral valve regurgitation. No evidence of mitral valve stenosis. Tricuspid Valve: The tricuspid valve is normal in structure. Tricuspid valve regurgitation is not  demonstrated. No evidence of tricuspid stenosis. Aortic Valve: The aortic valve has an indeterminant number of cusps. Aortic valve regurgitation is not visualized. No aortic stenosis is present. Pulmonic Valve: The pulmonic valve was normal in structure. Pulmonic valve regurgitation is not visualized. No evidence of pulmonic stenosis. Aorta: Aortic dilatation noted. 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 Venous: The inferior vena cava is normal in size with greater than 50% respiratory variability, suggesting right atrial pressure of 3 mmHg. IAS/Shunts: No atrial level shunt detected by color flow Doppler.  LEFT VENTRICLE PLAX 2D LVIDd:         3.90 cm     Diastology LVIDs:         3.00 cm     LV e' medial:  5.44 cm/s LV PW:  1.10 cm     LV e' lateral: 7.83 cm/s LV IVS:        1.20 cm LVOT diam:     2.20 cm LV SV:         49 LV SV Index:   26 LVOT Area:     3.80 cm  LV Volumes (MOD) LV vol d, MOD A2C: 70.5 ml LV vol d, MOD A4C: 57.8 ml LV vol s, MOD A4C: 34.3 ml LV SV MOD A4C:     57.8 ml RIGHT VENTRICLE RV S prime:     7.18 cm/s TAPSE (M-mode): 1.3 cm LEFT ATRIUM             Index LA diam:        3.70 cm 1.95 cm/m LA Vol (A2C):   74.9 ml 39.49 ml/m LA Vol (A4C):   64.1 ml 33.80 ml/m LA Biplane Vol: 71.3 ml 37.59 ml/m  AORTIC VALVE LVOT Vmax:   78.50 cm/s LVOT Vmean:  50.100 cm/s LVOT VTI:    0.128 m  AORTA Ao Root diam: 4.30 cm TRICUSPID VALVE TR Peak grad:   41.5 mmHg TR Vmax:        322.00 cm/s  SHUNTS Systemic VTI:  0.13 m Systemic Diam: 2.20 cm Aditya Sabharwal Electronically signed by Ria Commander Signature Date/Time: 11/09/2023/4:54:25 PM    Final    CT Angio Chest PE W and/or Wo Contrast Addendum Date: 11/08/2023 ADDENDUM REPORT: 11/08/2023 20:30 ADDENDUM: Critical Value/emergent results were called by telephone at the time of interpretation by Dr Duwaine Severs on Sept 15, 530 p.m. to provider Dr. Simon who verbally  acknowledged these results. Electronically Signed   By: Megan  Zare M.D.   On: 11/08/2023 20:30   Result Date: 11/08/2023 CLINICAL DATA:  High probability for PE, non-small cell lung cancer. EXAM: CT ANGIOGRAPHY CHEST WITH CONTRAST TECHNIQUE: Multidetector CT imaging of the chest was performed using the standard protocol during bolus administration of intravenous contrast. Multiplanar CT image reconstructions and MIPs were obtained to evaluate the vascular anatomy. RADIATION DOSE REDUCTION: This exam was performed according to the departmental dose-optimization program which includes automated exposure control, adjustment of the mA and/or kV according to patient size and/or use of iterative reconstruction technique. CONTRAST:  75mL OMNIPAQUE  IOHEXOL  350 MG/ML SOLN COMPARISON:  PET-CT August 11, 2023, chest CT October 26, 2023. FINDINGS: Cardiovascular: Many of the distal subsegmental branches predominantly in right lower lobe are under opacified possibility due to ongoing postradiation/posttreatment pneumonitis versus parenchymal infectious/inflammatory process. No large pulmonary embolism to the level of segmental branches. The heart size is enlarged. Pulmonary artery is dilated measuring up to 37 mm. 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 Right hepatic lobe calcification. Mediastinum/Nodes: Multi station lymphadenopathy index lymph node in right lower paratracheal measures 1.3 cm. Soft tissue in right hilar region may represent atelectasis/consolidation versus conglomerate lymph nodes Lungs/Pleura: Progressive ground-glass attenuation, interlobular septal thickening and now involving entire lobes, previously predominantly in right lower lobe. There is a interval increase in right-sided pleural effusion now small. The right hilar mass is grossly stable measures 4.3 x 5.4 cm, previously measured 3 x 5.6 cm. Upper Abdomen: Suggestive of prior granulomatous disease. Punctate calcification is also  identified in the spleen. Left adrenal nodule measuring approximately 2.3 x 1.4 cm, stable. Right adrenal is slightly thickened. Hiatal hernia. Musculoskeletal: Multilevel degenerative changes of the spine. Right sixth and 8 rib healed fracture. DISH . Review of the MIP images confirms the above findings. IMPRESSION: Interval development  of multi lobar large areas of new ground-glass attenuation, interlobular septal thickening cystic changes/bronchiectasis most consistent with pulmonary fat embolism. Differentials include acute respiratory distress syndrome (ARDS) and posttreatment changes (postradiation/post chemotherapy pneumonitis in the presence of recent therapy). No large filling defect to the level of segmental arteries. Some filling defects in subsegmental branches may represent artifact however tiny emboli can not be excluded. Right hilar mass, grossly stable to prior consistent with known malignancy. Small right pleural effusion, slightly increased to prior. Left adrenal nodule stable to prior. Stable aneurysmal dilation of ascending aorta and enlarged pulmonary artery. https://radiopaedia.org/articles/pulmonary-fat-embolism?lang=us  Electronically Signed: By: Megan  Zare M.D. On: 11/08/2023 20:20     LOS: 2 days   Donalda Applebaum, MD  [facility]    To contact the attending provider between 7A-7P or the covering provider during after hours 7P-7A, please log into the web site [email or web address] and access using universal [place] password for that web site. If you do not have the password, please call the hospital operator.  11/10/2023, 1:20 PM

## 2023-11-10 NOTE — Progress Notes (Signed)
 OT Cancellation Note  Patient Details Name: Stephen Meza MRN: [id number] DOB: Sep 11, 1933   Cancelled Treatment:    Reason Eval/Treat Not Completed: Medical issues which prohibited therapy. OT will follow up next available time as approrpriate  Jacques Karna Loose 11/10/2023, 10:07 AM

## 2023-11-10 NOTE — Consult Note (Signed)
 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 History provided mostly by his wife and daughter at bedside. He is planned to start immunotherapy (Durvalumab) tomorrow. Patient has continued to feel dizzy since after he was started on treatment for his cancer due to poor oral intake. In the last few days, he is having dyspnea with even minimal exertion and today he had 3 episodes of syncope which were witnessed by family.  No falls, head/neck injury,  or seizure-like activity reported.  Denied aspiration/choking with fluids. Denied chest pain, N/V/D, rash, wheezing, and URTI symptoms. He is not HOT. Smoked but quit 35 yrs ago. On Albuterol  PRN but he never used it. Initially stable on 2-4 L nasal cannula but later had increasing oxygen requirements.  Now on 60 L HFNC, 72% FiO2 with SpO2 94%, sinus tachycardia 106. No acute ischemic changes on EKG.   Pertinent  Medical History  Lung stage 3B SqCC of RUL dx June 2025, HTN, dyslipidemia, PVD, A-fib, CVA in March 2025, right carotid stent, prostate and bladder cancer, gout, anemia of chronic illness, peripheral neuropathy, BPH, GERD   Significant Hospital Events: Including procedures, antibiotic start and stop dates in addition to other pertinent events    11/08/2023 - aDMIT  9/16 - still in ER bed 47. On HFNC. Wife and daughter at bedside. AT baseline not on o2. Finished chemo/XR some 4 weeks ago. Immune therapy pending. Presented with fever, dyspnea, hyempptysis, and syncope. Dramatic change in CT chest at admit versus 13 days earlier: Sudden increase in GGO - NOW. Afe brile sine amdit. DNormal WBC.  RHINOVIRUS POISITIV  Interim History / Subjective:   9/17 - now in 5w28. On 40L o2 at 100%Fio2. Got fluid bolus.aBG with po2 170. CRP 12.1, ESR 81, Procal 0.23. Afebriole. Breifly hypotensive and got albumin  and midodrine  overnight  - ECHO - mild increased PASP. No pericardial effsion . EF > 55%  - Wife overnight sreports he got deliriuous and took his mask off  - she is prepared for him not surviving event and she says she likes to tak him home in that event.   - [facility] has met with her  Objective    Blood pressure 119/67, pulse 93, temperature 98.4 F (36.9 C), temperature source Oral, resp. rate (!) 27, height 6' (1.829 m), weight 69 kg, SpO2 90%.    FiO2 (%):  [60 %-100 %] 100 %   Intake/Output Summary (Last 24 hours) at 11/10/2023 0910 Last data filed at 11/10/2023 0359 Gross per 24 hour  Intake 707.31 ml  Output 675 ml  Net 32.31 ml   Filed Weights   11/08/23 1710  Weight: 69 kg    Examination:    General Appearance:  Looks deconditioned.  Head:  Normocephalic, without obvious  abnormality, atraumatic Eyes:  PERRL - yes, conjunctiva/corneas - muddy     Ears:  Normal external ear canals, both ears Nose:  G tube - HHFNC Throat:  ETT TUBE - no , OG tube - no Neck:  Supple,  No enlargement/tenderness/nodules Lungs: Clear to auscultation bilaterally Heart:  S1 and S2 normal, no murmur, CVP - no.  Pressors - no Abdomen:  Soft, no masses, no organomegaly Genitalia / Rectal:  Not done Extremities:  Extremities- intact Skin:  ntact in exposed areas . Sacral area - not examined Neurologic:  Sedation - none -> RASS - +1 to +1.  Moves all 4s - yes. CAM-ICU - POPSITIVE . Orientation - sleepy     Resolved problem list   Assessment and Plan  Acute hypoxic resp failure and submassive hemoptysis. 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 Remains on HFNC (MRSA PCRc Neg). No response to hydrocort . ESR/cRP HIGH  Plan  - Chane hdyrocort to solumedrol 80mg  Q6h x 2 days and then prednisone  total 2 week -taper - await urine legionella  - await Fung[place]ell - o2 for pulse ox goal > 88% - can do BiPAP (at bedtime + 2 day time applications)  as primary Rx for acute hypoxemic resp failure if there are no contrandication (currently has delirium)  Lung stage 3B SqCC of RUL dx June 2025, s/p 6 cycles of chemoRx (last one was 09/27/2023), and 7 weeks of radiation therapy (last one was 10/08/2023)  Plan  - per onc - immunotherapy on  hold  HTN, dyslipidemia, PVD, A-fib, CVA in March 2025 (right carotid stent on ASA and Plavix ) Type 2 elevated Trop due to acute hypoxic resp failure -Echo  -Hold Plavix   -Ok to resume ASA since he has carotid stent in March  Anemia of chronic illness -Monitor labs  HypoNa -Monitor labs  Lactic acidosis due to hypoxia and resp distress: resolved  DNR/DNI   Wife updated  CCM will sign off  D/w DR Jackalyn ENGLAND    Dr. Dorethia Cave, M.D., F.C.C.P,  Pulmonary and Critical Care Medicine Staff Physician, [facility] Director - Interstitial Lung Disease  Program  Pulmonary Fibrosis [facility] at [place], [place], [address]   Pager: [phone], If no answer  -> Check AMION or Try [phone] Telephone (clinical office): [phone] Telephone (research): [phone]  9:10 AM 11/10/2023    LABS    PULMONARY Recent Labs  Lab 11/10/23 0335  PHART 7.46*  PCO2ART 31*  PO2ART 170*  HCO3 22.0   O2SAT 99.1    CBC Recent Labs  Lab 11/08/23 1716 11/09/23 1713 11/10/23 0346  HGB 8.2* 10.1* 8.3*  HCT 27.0* 32.6* 26.0*  WBC 9.0 9.9 11.2*  PLT 213 249 216    COAGULATION Recent Labs  Lab 11/08/23 1716  INR 1.3*    CARDIAC  No results for input(s): TROPONINI in the last 168 hours. No results for input(s): PROBNP in the last 168 hours.   CHEMISTRY Recent Labs  Lab 11/08/23 1716 11/09/23 0413 11/09/23 1713 11/10/23 0346  NA 126* 131* 132* 131*  K 3.8 3.2* 4.4 3.6  CL 95* 101 100 102  CO2 19* 19* 22 19*  GLUCOSE 141* 117* 145* 116*  BUN 29* 19 20 27*  CREATININE 0.79 0.69 0.73 0.77  CALCIUM  8.0* 7.9* 8.7* 8.3*  MG  --   --  2.1 1.9  PHOS  --   --  3.7 3.9  3.8   Estimated Creatinine Clearance: 62.3 mL/min (by C-G formula based on SCr of 0.77 mg/dL).   LIVER Recent Labs  Lab 11/08/23 1716 11/10/23 0346  AST 36  --   ALT 30  --   ALKPHOS 81  --   BILITOT 0.5  --   PROT 5.9*  --   ALBUMIN  2.3* 2.4*  INR 1.3*  --      INFECTIOUS Recent Labs  Lab 11/08/23 1723 11/08/23 2331 11/09/23 0413  LATICACIDVEN 2.9* 1.2  --   PROCALCITON  --   --  0.23     ENDOCRINE CBG (last 3)  Recent Labs    11/10/23 0044 11/10/23 0459 11/10/23 0904  GLUCAP 113* 120* 142*         IMAGING x48h  - image(s) personally visualized  -   highlighted in bold DG Chest Port 1V same Day Result Date: 11/10/2023 CLINICAL DATA:  Shortness of breath. EXAM: PORTABLE CHEST 1 VIEW COMPARISON:  Chest CT 11/08/2023 FINDINGS: Patient is rotated to the right. The cardio pericardial silhouette is enlarged. Asymmetric diffuse patchy airspace disease noted without substantial pleural effusion. Left Port-A-Cath evident with distal tip overlying the region of the distal SVC near the RA junction. Age indeterminate anterior right rib fractures evident. Telemetry leads overlie the chest. IMPRESSION: Asymmetric diffuse patchy airspace disease. Imaging features compatible with  multifocal pneumonia or asymmetric edema. Electronically Signed   By: Camellia Candle M.D.   On: 11/10/2023 08:18   ECHOCARDIOGRAM COMPLETE Result Date: 11/09/2023    ECHOCARDIOGRAM REPORT   Patient Name:   JENEL RONAL PRETTY Date of Exam: 11/09/2023 Medical Rec #:  [id number]            Height:       72.0 in Accession #:    [id number]           Weight:       152.1 lb Date of Birth:  [birth year]            BSA:          1.897 m Patient Age:    89 years             BP:           118/74 mmHg Patient Gender: M                    HR:           89 bpm. Exam Location:  Inpatient Procedure: 2D Echo (Both Spectral and Color Flow Doppler were utilized during            procedure). Indications:    syncope  History:        Patient has prior history of Echocardiogram examinations, most                 recent 05/11/2023. Lung cancer, Arrythmias:P-Afib,                 Signs/Symptoms:Shortness of Breath; Risk Factors:Former Smoker.  Sonographer:    Tinnie Barefoot RDCS Referring Phys: [id number] RATHORE  Sonographer Comments: Image acquisition challenging due to respiratory motion. Difficult to evaluate MR due to respiratory interference IMPRESSIONS  1. Left ventricular ejection fraction, by estimation, is 55 to 60%. The left ventricle has normal function. The left ventricle has no regional wall motion abnormalities. Left ventricular diastolic parameters are indeterminate.  2. Right ventricular systolic function  is moderately reduced. The right ventricular size is normal. There is mildly elevated pulmonary artery systolic pressure. The estimated right ventricular systolic pressure is 44.5 mmHg.  3. The mitral valve is normal in structure. Mild to moderate mitral valve regurgitation. No evidence of mitral stenosis.  4. The aortic valve has an indeterminant number of cusps. Aortic valve regurgitation is not visualized. No aortic stenosis is present.  5. Aortic dilatation noted. There is dilatation of the aortic root,  measuring 43 mm. There is dilatation of the ascending aorta, measuring 40 mm.  6. The inferior vena cava is normal in size with greater than 50% respiratory variability, suggesting right atrial pressure of 3 mmHg. FINDINGS  Left Ventricle: Left ventricular ejection fraction, by estimation, is 55 to 60%. The left ventricle has normal function. The left ventricle has no regional wall motion abnormalities. The left ventricular internal cavity size was normal in size. There is  no left ventricular hypertrophy. Left ventricular diastolic parameters are indeterminate. Right Ventricle: The right ventricular size is normal. No increase in right ventricular wall thickness. Right ventricular systolic function is moderately reduced. There is mildly elevated pulmonary artery systolic pressure. The tricuspid regurgitant velocity is 3.22 m/s, and with an assumed right atrial pressure of 3 mmHg, the estimated right ventricular systolic pressure is 44.5 mmHg. Left Atrium: Left atrial size was normal in size. Right Atrium: Right atrial size was normal in size. Pericardium: There is no evidence of pericardial effusion. Mitral Valve: The mitral valve is normal in structure. Mild to moderate mitral valve regurgitation. No evidence of mitral valve stenosis. Tricuspid Valve: The tricuspid valve is normal in structure. Tricuspid valve regurgitation is not demonstrated. No evidence of tricuspid stenosis. Aortic Valve: The aortic valve has an indeterminant number of cusps. Aortic valve regurgitation is not visualized. No aortic stenosis is present. Pulmonic Valve: The pulmonic valve was normal in structure. Pulmonic valve regurgitation is not visualized. No evidence of pulmonic stenosis. Aorta: Aortic dilatation noted. 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 Venous: The inferior vena cava is normal in size with greater than 50% respiratory variability, suggesting  right atrial pressure of 3 mmHg. IAS/Shunts: No atrial level shunt detected by color flow Doppler.  LEFT VENTRICLE PLAX 2D LVIDd:         3.90 cm     Diastology LVIDs:         3.00 cm     LV e' medial:  5.44 cm/s LV PW:         1.10 cm     LV e' lateral: 7.83 cm/s LV IVS:        1.20 cm LVOT diam:     2.20 cm LV SV:         49 LV SV Index:   26 LVOT Area:     3.80 cm  LV Volumes (MOD) LV vol d, MOD A2C: 70.5 ml LV vol d, MOD A4C: 57.8 ml LV vol s, MOD A4C: 34.3 ml LV SV MOD A4C:     57.8 ml RIGHT VENTRICLE RV S prime:     7.18 cm/s TAPSE (M-mode): 1.3 cm LEFT ATRIUM             Index LA diam:        3.70 cm 1.95 cm/m LA Vol (A2C):   74.9 ml 39.49 ml/m LA Vol (A4C):   64.1 ml 33.80 ml/m LA Biplane Vol: 71.3 ml 37.59  ml/m  AORTIC VALVE LVOT Vmax:   78.50 cm/s LVOT Vmean:  50.100 cm/s LVOT VTI:    0.128 m  AORTA Ao Root diam: 4.30 cm TRICUSPID VALVE TR Peak grad:   41.5 mmHg TR Vmax:        322.00 cm/s  SHUNTS Systemic VTI:  0.13 m Systemic Diam: 2.20 cm Aditya Sabharwal Electronically signed by Ria Commander Signature Date/Time: 11/09/2023/4:54:25 PM    Final    CT Angio Chest PE W and/or Wo Contrast Addendum Date: 11/08/2023 ADDENDUM REPORT: 11/08/2023 20:30 ADDENDUM: Critical Value/emergent results were called by telephone at the time of interpretation by Dr Duwaine Severs on Sept 15, 530 p.m. to provider Dr. Simon who verbally acknowledged these results. Electronically Signed   By: Megan  Zare M.D.   On: 11/08/2023 20:30   Result Date: 11/08/2023 CLINICAL DATA:  High probability for PE, non-small cell lung cancer. EXAM: CT ANGIOGRAPHY CHEST WITH CONTRAST TECHNIQUE: Multidetector CT imaging of the chest was performed using the standard protocol during bolus administration of intravenous contrast. Multiplanar CT image reconstructions and MIPs were obtained to evaluate the vascular anatomy. RADIATION DOSE REDUCTION: This exam was performed according to the departmental dose-optimization program which includes  automated exposure control, adjustment of the mA and/or kV according to patient size and/or use of iterative reconstruction technique. CONTRAST:  75mL OMNIPAQUE  IOHEXOL  350 MG/ML SOLN COMPARISON:  PET-CT August 11, 2023, chest CT October 26, 2023. FINDINGS: Cardiovascular: Many of the distal subsegmental branches predominantly in right lower lobe are under opacified possibility due to ongoing postradiation/posttreatment pneumonitis versus parenchymal infectious/inflammatory process. No large pulmonary embolism to the level of segmental branches. The heart size is enlarged. Pulmonary artery is dilated measuring up to 37 mm. 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 Right hepatic lobe calcification. Mediastinum/Nodes: Multi station lymphadenopathy index lymph node in right lower paratracheal measures 1.3 cm. Soft tissue in right hilar region may represent atelectasis/consolidation versus conglomerate lymph nodes Lungs/Pleura: Progressive ground-glass attenuation, interlobular septal thickening and now involving entire lobes, previously predominantly in right lower lobe. There is a interval increase in right-sided pleural effusion now small. The right hilar mass is grossly stable measures 4.3 x 5.4 cm, previously measured 3 x 5.6 cm. Upper Abdomen: Suggestive of prior granulomatous disease. Punctate calcification is also identified in the spleen. Left adrenal nodule measuring approximately 2.3 x 1.4 cm, stable. Right adrenal is slightly thickened. Hiatal hernia. Musculoskeletal: Multilevel degenerative changes of the spine. Right sixth and 8 rib healed fracture. DISH . Review of the MIP images confirms the above findings. IMPRESSION: Interval development of multi lobar large areas of new ground-glass attenuation, interlobular septal thickening cystic changes/bronchiectasis most consistent with pulmonary fat embolism. Differentials include acute respiratory distress syndrome (ARDS) and posttreatment changes  (postradiation/post chemotherapy pneumonitis in the presence of recent therapy). No large filling defect to the level of segmental arteries. Some filling defects in subsegmental branches may represent artifact however tiny emboli can not be excluded. Right hilar mass, grossly stable to prior consistent with known malignancy. Small right pleural effusion, slightly increased to prior. Left adrenal nodule stable to prior. Stable aneurysmal dilation of ascending aorta and enlarged pulmonary artery. https://radiopaedia.org/articles/pulmonary-fat-embolism?lang=us  Electronically Signed: By: Megan  Zare M.D. On: 11/08/2023 20:20

## 2023-11-10 NOTE — Progress Notes (Signed)
 Pharmacy Consult for Lovenox  Indication: DVT  Allergies  Allergen Reactions   Ciprofloxacin Hcl Other (See Comments)    numbness   Statins Other (See Comments)    Intolerance, joint and muscle pain   Nitrofuran Derivatives Rash    Patient Measurements: Height: 6' (182.9 cm) Weight: 69 kg (152 lb 1.9 oz) IBW/kg (Calculated) : 77.6 HEPARIN  DW (KG): 69  Vital Signs: Temp: 98.4 F (36.9 C) (09/17 0907) Temp Source: Oral (09/17 0907) BP: 119/67 (09/17 0907) Pulse Rate: 93 (09/17 0907)  Labs: Recent Labs    11/08/23 1716 11/09/23 0413 11/09/23 1713 11/10/23 0346  HGB 8.2*  --  10.1* 8.3*  HCT 27.0*  --  32.6* 26.0*  PLT 213  --  249 216  LABPROT 16.9*  --   --   --   INR 1.3*  --   --   --   CREATININE 0.79 0.69 0.73 0.77    Estimated Creatinine Clearance: 62.3 mL/min (by C-G formula based on SCr of 0.77 mg/dL).  Assessment: Stephen Meza a 89 y.o. male presented with acute respiratory failure in setting of advanced lung cancer, now with concern for DVT.  Ultrasound demonstrates bilateral lower extremity DVTs. Pharmacy has been consulted for enoxaparin  dosing.   Patinet also noted to have some hemoptysis related to his lung cancer, but no large clots.   Anticoagulation PTA: Patient not on anticoagulation PTA.- OF note, patient appears to have been on Eliquis  in the past for atrial fibrillation. Outpatient oncology note states that his was able to be held 07/26/23 for bronchoscopy, though there is no other chart documentation that he was taking Eliquis  and no fill hx. He also appears to have been on bASA and clopidogrel  PTA for ?PVD, CVA.   Goal of Therapy:  Monitor platelets by anticoagulation protocol: Yes   Plan:  START lovenox  70 mg Landis Q12H  CBC in AM Will need to confirm patient's AC history   Massie Fila, PharmD Clinical Pharmacist  11/10/2023 10:57 AM

## 2023-11-10 NOTE — Plan of Care (Signed)
 Patient O2 sat dropped to 83% on high flow nasal cannula oxygen.  After placing the facemask O2 sat improved to around 90%.  Plan is to place heated high flow oxygen 60 L which can deliver FiO2 70%.  If height and hypoplasia in that case will place BiPAP as needed.  Checking ABG.  Patient also hypotensive giving LR bolus, albumin  and midodrine .  Continue aspiration precaution.  This patient being admitted for acute hypoxic respiratory failure secondary to ARDS/ALI secondary to rhinovirus infection.  Continue scheduled breathing treatment, Mucomyst  and patient is already on IV steroid 100 mg 3 times daily and azithromycin .  Patient was on ceftriaxone  and vancomycin  earlier pulmonology being evaluated daytime recommendation continue azithromycin  only.  Aeson Sawyers, MD [facility] 11/10/2023, 2:15 AM

## 2023-11-11 ENCOUNTER — Other Ambulatory Visit (HOSPITAL_COMMUNITY): Payer: Self-pay

## 2023-11-11 ENCOUNTER — Ambulatory Visit: Admitting: Orthopedic Surgery

## 2023-11-11 ENCOUNTER — Telehealth (HOSPITAL_COMMUNITY): Payer: Self-pay | Admitting: Pharmacy Technician

## 2023-11-11 DIAGNOSIS — E871 Hypo-osmolality and hyponatremia: Secondary | ICD-10-CM

## 2023-11-11 DIAGNOSIS — I480 Paroxysmal atrial fibrillation: Secondary | ICD-10-CM | POA: Diagnosis not present

## 2023-11-11 DIAGNOSIS — Z515 Encounter for palliative care: Secondary | ICD-10-CM

## 2023-11-11 DIAGNOSIS — R0602 Shortness of breath: Secondary | ICD-10-CM | POA: Insufficient documentation

## 2023-11-11 DIAGNOSIS — J9601 Acute respiratory failure with hypoxia: Secondary | ICD-10-CM | POA: Diagnosis not present

## 2023-11-11 DIAGNOSIS — Z66 Do not resuscitate: Secondary | ICD-10-CM

## 2023-11-11 DIAGNOSIS — R55 Syncope and collapse: Secondary | ICD-10-CM | POA: Insufficient documentation

## 2023-11-11 DIAGNOSIS — R7989 Other specified abnormal findings of blood chemistry: Secondary | ICD-10-CM | POA: Diagnosis not present

## 2023-11-11 LAB — COMPREHENSIVE METABOLIC PANEL WITH GFR
ALT: 42 U/L (ref 0–44)
AST: 45 U/L — ABNORMAL HIGH (ref 15–41)
Albumin: 2.5 g/dL — ABNORMAL LOW (ref 3.5–5.0)
Alkaline Phosphatase: 82 U/L (ref 38–126)
Anion gap: 13 (ref 5–15)
BUN: 23 mg/dL (ref 8–23)
CO2: 22 mmol/L (ref 22–32)
Calcium: 8.5 mg/dL — ABNORMAL LOW (ref 8.9–10.3)
Chloride: 100 mmol/L (ref 98–111)
Creatinine, Ser: 0.68 mg/dL (ref 0.61–1.24)
GFR, Estimated: >60 mL/min (ref >60)
Glucose, Bld: 123 mg/dL — ABNORMAL HIGH (ref 70–99)
Potassium: 3.8 mmol/L (ref 3.5–5.1)
Sodium: 135 mmol/L (ref 135–145)
Total Bilirubin: 0.6 mg/dL (ref 0.0–1.2)
Total Protein: 5.7 g/dL — ABNORMAL LOW (ref 6.5–8.1)

## 2023-11-11 LAB — CBC
HCT: 26.4 % — ABNORMAL LOW (ref 39.0–52.0)
Hemoglobin: 8.3 g/dL — ABNORMAL LOW (ref 13.0–17.0)
MCH: 29.2 pg (ref 26.0–34.0)
MCHC: 31.4 g/dL (ref 30.0–36.0)
MCV: 93 fL (ref 80.0–100.0)
Platelets: 200 K/uL (ref 150–400)
RBC: 2.84 MIL/uL — ABNORMAL LOW (ref 4.22–5.81)
RDW: 19.9 % — ABNORMAL HIGH (ref 11.5–15.5)
WBC: 6.8 K/uL (ref 4.0–10.5)
nRBC: 0 % (ref 0.0–0.2)

## 2023-11-11 LAB — C-REACTIVE PROTEIN: CRP: 7.6 mg/dL — ABNORMAL HIGH (ref <1.0)

## 2023-11-11 LAB — GLUCOSE, CAPILLARY
Glucose-Capillary: 119 mg/dL — ABNORMAL HIGH (ref 70–99)
Glucose-Capillary: 122 mg/dL — ABNORMAL HIGH (ref 70–99)
Glucose-Capillary: 128 mg/dL — ABNORMAL HIGH (ref 70–99)

## 2023-11-11 MED ORDER — METHYLPREDNISOLONE SODIUM SUCC 125 MG IJ SOLR
100.0000 mg | Freq: Two times a day (BID) | INTRAMUSCULAR | Status: DC
  Administered 2023-11-11 – 2023-11-12 (×3): 100 mg via INTRAVENOUS
  Filled 2023-11-11 (×3): qty 2

## 2023-11-11 MED ORDER — MORPHINE SULFATE (PF) 2 MG/ML IV SOLN: 1.0000 mg | INTRAVENOUS | Status: DC | PRN

## 2023-11-11 MED ORDER — APIXABAN 5 MG PO TABS
10.0000 mg | ORAL_TABLET | Freq: Two times a day (BID) | ORAL | Status: DC
  Administered 2023-11-11 – 2023-11-12 (×3): 10 mg via ORAL
  Filled 2023-11-11 (×3): qty 2

## 2023-11-11 MED ORDER — LORAZEPAM 2 MG/ML IJ SOLN: 0.5000 mg | INTRAMUSCULAR | Status: DC | PRN

## 2023-11-11 MED ORDER — GLYCOPYRROLATE 0.2 MG/ML IJ SOLN
0.4000 mg | INTRAMUSCULAR | Status: DC | PRN
  Administered 2023-11-13: 0.4 mg via INTRAVENOUS
  Filled 2023-11-11: qty 2

## 2023-11-11 MED ORDER — APIXABAN 5 MG PO TABS: 5.0000 mg | ORAL_TABLET | Freq: Two times a day (BID) | ORAL | Status: DC

## 2023-11-11 NOTE — Consult Note (Signed)
 Palliative Care Consult Note                                  Date: 11/11/2023   Patient Name: Stephen Meza  DOB:05-25-1933  FMW:[id number]  Age / Sex:89 y.o., male  PCP: [name], FNP Referring Physician: Raenelle Donalda HERO, MD  Reason for Consultation: Establishing goals of care  Past Medical History:  Diagnosis Date   Bladder cancer (HCC) 03/19/2016   DUODENITIS, WITHOUT HEMORRHAGE 07/14/2007   ERECTILE DYSFUNCTION 07/14/2007   Esophageal stricture 03/19/2016   ESOPHAGITIS 07/14/2007   EXTERNAL HEMORRHOIDS 07/14/2007   GERD 07/14/2007   Gout    Headache    HYPERTENSION 07/14/2007   PERIPHERAL NEUROPATHY 07/14/2007   Peripheral neuropathy 04/06/2019   PERIPHERAL VASCULAR DISEASE 07/14/2007   PODAGRA 07/14/2007   Prostate cancer (HCC) 02/2012   pt had screening and then biopsy   Prostate cancer (HCC) 04/13/2012   Mild rise in PSA lead to prostate biopsy - 1:11 positive for adenocarcinoma. CT abd/pelvis was negative except for enlarged prostate.     Treatment - hormonal     Retinal tear of right eye 03/19/2016   Stroke (HCC) 05/09/2023   TOBACCO ABUSE, HX OF 02/12/2010   TONSILLECTOMY, HX OF 07/14/2007   TREMOR, ESSENTIAL 07/14/2007   VITAMIN B12 DEFICIENCY 07/14/2007     Assessment & Plan:   HPI/Patient Profile: 89 y.o. male  with past medical history of Afib, CVA (04/2023), lung cancer s/p CRT, hx of prostate and bladder cancer admitted on 11/08/2023 with acute hypoxic respiratory failure 2/2 rhinovirus pneumonia. Patient presented to ED with shortness of breath and syncope in wheelchair without fall after completing CRT 1 month ago for lung cancer. During hospital stay the patient has increased oxygen requirement and now on heated high flow [place] at 40 L/min and 80% FiO2. Palliative consulted for discussion of goals of care.   - Suspect patient will have hours - days to live when oxygen is  discontinued  SUMMARY OF RECOMMENDATIONS   DNR - comfort, family wants more time for visitors before completely deescalating oxygen and other measures PRN morphine , ativan , and robinul  started Plan to fully de-escalate in the next 24-48 hours depending on patient's ability to continue to interact with family and visitors  Symptom Management:  Morphine  1-2 mg IV Q1 PRN for pain/dyspnea Ativan  0.5-1 mg IV Q5 PRN for anxiety Robinul  0.4 mg Q4 PRN for secretions  Code Status: DNR - Limited (DNR/DNI)  Prognosis:  < 2 weeks  Discharge Planning:  Anticipated Hospital Death   Discussed with: Ghimire MD, Neysa PEAK, Kennyth NP, Tanda RN, [place] (wife), Alfonse (daughter), Jon (daughter), Garrel (son in law)  Subjective:   Reviewed medical records, received report from team, assessed the patient and then meet at the patient's bedside to discuss diagnosis, prognosis, GOC, EOL wishes disposition and options.  Before meeting with the patient/family, I spent time reviewing the chart notes including H&P, progress notes, and PT/OT/SLP notes . I also reviewed vital signs, nursing flowsheets, medication administrations record, labs, and imaging.  I met with: [place] (wife) Lylea (Daughter) Jon (Daughter) Garrel (son in law)   We meet to discuss diagnosis prognosis, GOC, EOL wishes, disposition and options. Concept of Palliative Care was introduced as specialized medical care for people and their families living with serious illness.  If focuses on providing relief from the symptoms and stress of a serious illness.  The goal is to improve quality of life for both the patient and the family. Values and goals of care important to patient and family were attempted to be elicited.  Created space and opportunity for patient  and family to explore thoughts and feelings regarding current medical situation   Natural trajectory and current clinical status were discussed. Questions and concerns addressed.  Patient encouraged to call with questions or concerns.    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 - Married to for 67 years with 2 daughters and grandchildren who are very involved in their lives  Baseline Status: - Was very independent and active even in his later years, was able to continue mowing the lawn up until last year - Rolling walker since CVA 04/2023 - Per chart review, has needed more assistance with ADLs since CVA and has been requiring more assistance since chemoradiation - Poor PO intake for months, reports lack of appetite  Today's Discussion: - Discussed patient's poor prognosis and anticipatory guidance on what to expect when we fully de-escalate when the family is ready, plan to continue current management with discontinuation of labs and addition of PRN medications for pain, dyspnea, and secretions - Wife expressed interest in home hospice but given the patient's current oxygen requirement transport on a less oxygen would not be possible  Review of Systems  Unable to perform ROS   Objective:   Primary Diagnoses: Present on Admission:  Acute hypoxemic respiratory failure (HCC)  Hyponatremia   Vital Signs:  BP (!) 124/95   Pulse 87   Temp 98.5 F (36.9 C) (Oral)   Resp (!) 21   Ht 6' (1.829 m)   Wt 69 kg   SpO2 90%   BMI 20.63 kg/m   Physical Exam Constitutional:      Appearance: He is ill-appearing.  HENT:     Head: Normocephalic.  Eyes:      Extraocular Movements: Extraocular movements intact.  Pulmonary:     Effort: Accessory muscle usage present.     Comments: HHFNC in place, dyspnea when conversing.  Musculoskeletal:     Right lower leg: Edema present.     Left lower leg: Edema present.  Neurological:     General: No focal deficit present.     Mental Status: He is alert.    Palliative Assessment/Data: 40%     Thank you for allowing us  to participate in the care of Stephen Meza PMT will continue to support holistically.  Billing based on MDM: High  Problems Addressed: One acute or chronic illness or injury that poses a threat to life or bodily function  Amount and/or Complexity of Data: Category 1:Review of prior external note(s) from each unique source and Assessment requiring an independent historian(s) and Category 3:Discussion of management or test interpretation with external physician/other qualified health care professional/appropriate source (not separately reported)  Risks: Parenteral controlled substances and Decision not to resuscitate or to de-escalate care because of poor prognosis  Detailed review of medical records (  labs, imaging, vital signs), medically appropriate exam, discussed with treatment team, counseling and education to patient, family, & staff, documenting clinical information, medication management, coordination of care.  Signed by: Fairy FORBES Shan DEVONNA Palliative Medicine Team  Team Phone # [phone] (Nights/Weekends)  11/11/2023, 12:23 PM

## 2023-11-11 NOTE — Discharge Instructions (Addendum)
 Information on my medicine - ELIQUIS  (apixaban )  Why was Eliquis  prescribed for you? Eliquis  was prescribed to treat blood clots that may have been found in the veins of your legs (deep vein thrombosis) or in your lungs (pulmonary embolism) and to reduce the risk of them occurring again.  What do You need to know about Eliquis  ? The starting dose is 10 mg (two 5 mg tablets) taken TWICE daily for the FIRST SEVEN (7) DAYS, then on 9/25 PM  the dose is reduced to ONE 5 mg tablet taken TWICE daily.  Eliquis  may be taken with or without food.   Try to take the dose about the same time in the morning and in the evening. If you have difficulty swallowing the tablet whole please discuss with your pharmacist how to take the medication safely.  Take Eliquis  exactly as prescribed and DO NOT stop taking Eliquis  without talking to the doctor who prescribed the medication.  Stopping may increase your risk of developing a new blood clot.  Refill your prescription before you run out.  After discharge, you should have regular check-up appointments with your healthcare provider that is prescribing your Eliquis .    What do you do if you miss a dose? If a dose of ELIQUIS  is not taken at the scheduled time, take it as soon as possible on the same day and twice-daily administration should be resumed. The dose should not be doubled to make up for a missed dose.  Important Safety Information A possible side effect of Eliquis  is bleeding. You should call your healthcare provider right away if you experience any of the following: Bleeding from an injury or your nose that does not stop. Unusual colored urine (red or dark brown) or unusual colored stools (red or black). Unusual bruising for unknown reasons. A serious fall or if you hit your head (even if there is no bleeding).  Some medicines may interact with Eliquis  and might increase your risk of bleeding or clotting while on Eliquis . To help avoid this,  consult your healthcare provider or pharmacist prior to using any new prescription or non-prescription medications, including herbals, vitamins, non-steroidal anti-inflammatory drugs (NSAIDs) and supplements.  This website has more information on Eliquis  (apixaban ): [email or web address]

## 2023-11-11 NOTE — Evaluation (Signed)
 Physical Therapy Evaluation Patient Details Name: Stephen Meza MRN: [id number] DOB: 1933/12/23 Today's Date: 11/11/2023  History of Present Illness  Pt is an 89 y/o M admitted on 11/08/23 after presenting with c/o SOB, weakness, numerous episodes of syncope. Pt is being treated for acute hypoxic respiratory failure with ARDS 2/2 rhinovirus PNA +/- small PE not seen on CTA chest, pt also has RLE DVT. 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 Prior to admission pt was ambulatory with RW, requiring more assistance since starting chemo/radiation in late June. On this date, pt requires min assist for supine>sit, tolerates sitting EOB ~2 minutes before instructed to lie back down 2/2 elevated HR. Pt's SPO2 85% or > on supplemental O2, HR up to 172 bpm sitting EOB, no c/o symptoms from pt, HR returned to ~104 bpm after resting in bed ~4 minutes. Pt positioned in bed in chair position & educated on benefits of upright posture & ability to perform BLE LAQ while in this position. Will continue to follow pt acutely to progress mobility as able.        If plan is discharge home, recommend the following: A lot of help with walking and/or transfers;A lot of help with bathing/dressing/bathroom;Assist for transportation;Help with stairs or ramp for entrance;Assistance with cooking/housework   Can travel by private vehicle   No    Equipment Recommendations Other (comment) (defer to next venue)  Recommendations for Other Services       Functional Status Assessment Patient has had a recent decline in their functional status and demonstrates the ability to make significant improvements in function in a reasonable and predictable amount of time.     Precautions / Restrictions  Precautions Precautions: Fall Precaution/Restrictions Comments: monitor HR, O2 Restrictions Weight Bearing Restrictions Per Provider Order: No      Mobility  Bed Mobility Overal bed mobility: Needs Assistance Bed Mobility: Supine to Sit, Sit to Supine     Supine to sit: Min assist, HOB elevated, Used rails Sit to supine: Contact guard assist, HOB elevated, Used rails        Transfers                        Ambulation/Gait                  Stairs            Wheelchair Mobility     Tilt Bed    Modified Rankin (Stroke Patients Only)       Balance Overall balance assessment: Needs assistance Sitting-balance support: Feet supported Sitting balance-Leahy Scale: Fair Sitting balance - Comments: close supervision<>CGA static sitting                                     Pertinent Vitals/Pain Pain Assessment Pain Assessment: No/denies pain    Home Living Family/patient expects to be discharged to:: Private residence Living Arrangements: Spouse/significant other Available Help at Discharge: Family Type of Home: House Home Access: Stairs to enter Entrance Stairs-Rails: Right;Left;Can reach both Secretary/administrator of Steps: 4   Home Layout: Multi-level Home Equipment: Agricultural consultant (2 wheels);Shower seat;Grab bars - toilet;Grab bars - tub/shower;Wheelchair - manual  Prior Function Prior Level of Function : Needs assist             Mobility Comments: Has been using RW since CVA in March, denies falls. ADLs Comments: Since starting chemo/radiation pt requiring more assistance (peri hygiene after BM, assist with bathing, dressing).     Extremity/Trunk Assessment   Upper Extremity Assessment Upper Extremity Assessment: Generalized weakness    Lower Extremity Assessment Lower Extremity Assessment: Generalized weakness    Cervical / Trunk Assessment Cervical / Trunk Assessment: Kyphotic  Communication    Communication Communication: No apparent difficulties    Cognition Arousal: Alert Behavior During Therapy: WFL for tasks assessed/performed   PT - Cognitive impairments: Safety/Judgement                       PT - Cognition Comments: expresses fear of slipping off EOB Following commands: Intact       Cueing Cueing Techniques: Verbal cues     General Comments      Exercises     Assessment/Plan    PT Assessment Patient needs continued PT services  PT Problem List Decreased strength;Cardiopulmonary status limiting activity;Decreased activity tolerance;Decreased balance;Decreased knowledge of use of DME;Decreased mobility;Decreased knowledge of precautions;Decreased safety awareness       PT Treatment Interventions DME instruction;Balance training;Gait training;Neuromuscular re-education;Stair training;Functional mobility training;Therapeutic activities;Therapeutic exercise;Patient/family education    PT Goals (Current goals can be found in the Care Plan section)  Acute Rehab PT Goals Patient Stated Goal: ideally would like to return to PLOF PT Goal Formulation: With patient/family Time For Goal Achievement: 11/25/23 Potential to Achieve Goals: Fair    Frequency Min 2X/week     Co-evaluation PT/OT/SLP Co-Evaluation/Treatment: Yes Reason for Co-Treatment: Complexity of the patient's impairments (multi-system involvement) PT goals addressed during session: Balance;Mobility/safety with mobility         AM-PAC PT 6 Clicks Mobility  Outcome Measure Help needed turning from your back to your side while in a flat bed without using bedrails?: None Help needed moving from lying on your back to sitting on the side of a flat bed without using bedrails?: A Lot Help needed moving to and from a bed to a chair (including a wheelchair)?: A Lot Help needed standing up from a chair using your arms (e.g., wheelchair or bedside chair)?: Total Help needed to walk in  hospital room?: Total Help needed climbing 3-5 steps with a railing? : Total 6 Click Score: 11    End of Session Equipment Utilized During Treatment: Oxygen Activity Tolerance: Treatment limited secondary to medical complications (Comment) (elevated HR) Patient left: in bed;with call bell/phone within reach;with bed alarm set;with family/visitor present (bed in chair position) Nurse Communication: Mobility status (HR, O2) PT Visit Diagnosis: Muscle weakness (generalized) (M62.81);Difficulty in walking, not elsewhere classified (R26.2)    Time: 1203-1225 PT Time Calculation (min) (ACUTE ONLY): 22 min   Charges:   PT Evaluation $PT Eval Moderate Complexity: 1 Mod   PT General Charges $$ ACUTE PT VISIT: 1 Visit         Richerd Pinal, PT, DPT 11/11/23, 12:45 PM   Richerd CHRISTELLA Pinal 11/11/2023, 12:43 PM

## 2023-11-11 NOTE — Progress Notes (Signed)
 PROGRESS NOTE        PATIENT DETAILS Name: Stephen Meza Age: 89 y.o. Sex: male Date of Birth: 02/02/1934 Admit Date: 11/08/2023 Admitting Physician Editha Ram, MD ERE:Fjuuyztd, [name], FNP  Brief Summary: Patient is a 89 y.o.  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  Significant events: 9/15>> admit to [facility]  Significant studies: 915>> CT angio chest: Multilobar large areas of ground glass opacities-no large filling defect. 9/16>> echo: EF 55-60% 9/17>> B/L LE Doppler:+ DVT in RLE  Significant microbiology data: 9/15>> blood culture: No growth 9/16>> COVID/influenza/RSV PCR: Negative 9/16>> respiratory virus panel:+ve rhinovirus  Procedures: None  Consults: PCCM Palliative care  Subjective: Looks more alert today-per wife-had a better night compared to the night before-per night RN-did require addition of NRB due to nocturnal hypoxemia.  Objective: Vitals: Blood pressure (!) 124/95, pulse 87, temperature 98.5 F (36.9 C), temperature source Oral, resp. rate (!) 21, height 6' (1.829 m), weight 69 kg, SpO2 90%.   Exam: Gen Exam:Alert awake-not in any distress-however looks frail HEENT:atraumatic, normocephalic Chest: Bibasilar rales CVS:S1S2 regular Abdomen:soft non tender, non distended Extremities:no edema Neurology: Non focal Skin: no rash  Pertinent Labs/Radiology:    Latest Ref Rng & Units 11/11/2023    3:20 AM 11/10/2023    3:46 AM 11/09/2023    5:13 PM  CBC  WBC 4.0 - 10.5 K/uL 6.8  11.2  9.9   Hemoglobin 13.0 - 17.0 g/dL 8.3  8.3  89.8   Hematocrit 39.0 - 52.0 % 26.4  26.0   32.6   Platelets 150 - 400 K/uL 200  216  249     Lab Results  Component Value Date   NA 135 11/11/2023   K 3.8 11/11/2023   CL 100 11/11/2023   CO2 22 11/11/2023      Assessment/Plan: Acute hypoxic respiratory failure with ARDS secondary to rhinovirus pneumonia +/- small pulm embolism not seen on CTA chest (has RLE DVT on Doppler) Remains essentially unchanged-although some mild decrease in FiO2-Down to 80% but blood flow still at 40 L/min. Change Solu-Medrol  to twice daily dosing Continue bronchodilators Mobilize is much as possible Encourage incentive spirometry/flutter valve As noted yesterday-no further role in any escalation in care-if he were to deteriorate significantly or exhibit air hunger-family okay with transition to comfort measures.  Family discussion ongoing regarding if they would want to transition to hospice care.  Palliative care to see later today.  RLE DVT Change Lovenox  to Eliquis .  Syncope Possibly orthostatic/vasovagal mechanism in the setting of infection/coughing/dehydration-but with DVT-small transient PE also possible. 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  HTN BP soft this morning-hypotensive overnight Hold Coreg  Has been started on midodrine  overnight  Minimally elevated troponin-likely type II non-STEMI/demand ischemia Supportive care Echo stable Doubt further workup required  Gout Not in flare Continue allopurinol   GERD PPI  Advanced directive/palliative care DNR already in place Long discussion with spouse/2 daughters on 9/17-gentle medical treatment-no further escalation in  care-family discussion in progress-palliative care to see today.     The patient is critically ill with multiple organ system failure and requires high complexity decision making for assessment and support, frequent evaluation and titration of therapies, advanced monitoring, review of radiographic studies and interpretation of complex data.   Total Critical time spent equals 45 minutes  Code status:   Code Status: Limited: Do not attempt resuscitation (DNR) -DNR-LIMITED -Do Not Intubate/DNI    DVT Prophylaxis: SCDs Start: 11/09/23 0048 Full dose Lovenox    Family Communication: Spouse/2 daughters at bedside   Disposition Plan: Status is: Inpatient Remains inpatient appropriate because: Severe illness   Planned Discharge Destination: TBD remained   Diet: Diet Order             DIET DYS 2 Room service appropriate? No; Fluid consistency: Thin  Diet effective now                     Antimicrobial agents: Anti-infectives (From admission, onward)    Start     Dose/Rate Route Frequency Ordered Stop   11/10/23 1000  azithromycin  (ZITHROMAX ) tablet 500 mg        500 mg Oral Daily 11/09/23 0827 11/11/23 0913   11/09/23 2200  cefTRIAXone  (ROCEPHIN ) 1 g in sodium chloride  0.9 % 100 mL IVPB  Status:  Discontinued        1 g 200 mL/hr over 30 Minutes Intravenous Every 24 hours 11/09/23 0125 11/09/23 0959   11/09/23 0200  azithromycin  (ZITHROMAX ) 500 mg in sodium chloride  0.9 % 250 mL IVPB  Status:  Discontinued        500 mg 250 mL/hr over 60 Minutes Intravenous Every 24 hours 11/09/23 0125 11/09/23 0827   11/09/23 0130  vancomycin  (VANCOREADY) IVPB 1500 mg/300 mL        1,500 mg 150 mL/hr over 120 Minutes Intravenous  Once 11/09/23 0125 11/09/23 0543   11/09/23 0130  cefTRIAXone  (ROCEPHIN ) 1 g in sodium chloride  0.9 % 100 mL IVPB        1 g 200 mL/hr over 30 Minutes Intravenous  Once 11/09/23 0127 11/09/23 0322        MEDICATIONS: Scheduled Meds:  acetylcysteine   3  mL Nebulization TID   allopurinol   100 mg Oral Daily   aspirin  EC  81 mg Oral Daily   enoxaparin  (LOVENOX ) injection  70 mg Subcutaneous Q12H   ezetimibe   10 mg Oral Daily   ipratropium  0.5 mg Nebulization Q6H   levalbuterol   0.63 mg Nebulization Q6H   methylPREDNISolone  (SOLU-MEDROL ) injection  100 mg Intravenous Q12H   midodrine   5 mg Oral TID WC   pantoprazole   40 mg Oral Daily   Continuous Infusions:  sodium chloride  50 mL/hr at 11/10/23 1600   PRN Meds:.acetaminophen  **OR** acetaminophen , benzonatate , melatonin, metoprolol  tartrate, mouth rinse   I have personally reviewed following labs and imaging studies  LABORATORY DATA: CBC: Recent Labs  Lab 11/08/23 1716 11/09/23 1713 11/10/23 0346 11/11/23 0320  WBC 9.0 9.9 11.2* 6.8  NEUTROABS 8.0*  --   --   --   HGB 8.2* 10.1* 8.3* 8.3*  HCT 27.0* 32.6* 26.0* 26.4*  MCV 96.1 93.4 92.2 93.0  PLT 213 249 216 200    Basic Metabolic Panel: Recent Labs  Lab 11/08/23 1716 11/09/23 0413 11/09/23  1713 11/10/23 0346 11/11/23 0320  NA 126* 131* 132* 131* 135  K 3.8 3.2* 4.4 3.6 3.8  CL 95* 101 100 102 100  CO2 19* 19* 22 19* 22  GLUCOSE 141* 117* 145* 116* 123*  BUN 29* 19 20 27* 23  CREATININE 0.79 0.69 0.73 0.77 0.68  CALCIUM  8.0* 7.9* 8.7* 8.3* 8.5*  MG  --   --  2.1 1.9  --   PHOS  --   --  3.7 3.9  3.8  --     GFR: Estimated Creatinine Clearance: 62.3 mL/min (by C-G formula based on SCr of 0.68 mg/dL).  Liver Function Tests: Recent Labs  Lab 11/08/23 1716 11/10/23 0346 11/11/23 0320  AST 36  --  45*  ALT 30  --  42  ALKPHOS 81  --  82  BILITOT 0.5  --  0.6  PROT 5.9*  --  5.7*  ALBUMIN  2.3* 2.4* 2.5*   No results for input(s): LIPASE, AMYLASE in the last 168 hours. No results for input(s): AMMONIA in the last 168 hours.  Coagulation Profile: Recent Labs  Lab 11/08/23 1716  INR 1.3*    Cardiac Enzymes: No results for input(s): CKTOTAL, CKMB, CKMBINDEX, TROPONINI in the last 168  hours.  BNP (last 3 results) No results for input(s): PROBNP in the last 8760 hours.  Lipid Profile: No results for input(s): CHOL, HDL, LDLCALC, TRIG, CHOLHDL, LDLDIRECT in the last 72 hours.  Thyroid  Function Tests: No results for input(s): TSH, T4TOTAL, FREET4, T3FREE, THYROIDAB in the last 72 hours.  Anemia Panel: No results for input(s): VITAMINB12, FOLATE, FERRITIN, TIBC, IRON , RETICCTPCT in the last 72 hours.  Urine analysis:    Component Value Date/Time   COLORURINE YELLOW 11/08/2023 2319   APPEARANCEUR CLEAR 11/08/2023 2319   LABSPEC 1.023 11/08/2023 2319   PHURINE 6.0 11/08/2023 2319   GLUCOSEU NEGATIVE 11/08/2023 2319   GLUCOSEU NEGATIVE 05/21/2023 1448   HGBUR NEGATIVE 11/08/2023 2319   BILIRUBINUR NEGATIVE 11/08/2023 2319   KETONESUR NEGATIVE 11/08/2023 2319   PROTEINUR NEGATIVE 11/08/2023 2319   UROBILINOGEN 1.0 05/21/2023 1448   NITRITE NEGATIVE 11/08/2023 2319   LEUKOCYTESUR NEGATIVE 11/08/2023 2319    Sepsis Labs: Lactic Acid, Venous    Component Value Date/Time   LATICACIDVEN 1.2 11/08/2023 2331    MICROBIOLOGY: Recent Results (from the past 240 hours)  Culture, blood (Routine x 2)     Status: None (Preliminary result)   Collection Time: 11/08/23  5:15 PM   Specimen: BLOOD  Result Value Ref Range Status   Specimen Description BLOOD SITE NOT SPECIFIED  Final   Special Requests   Final    BOTTLES DRAWN AEROBIC AND ANAEROBIC Blood Culture results may not be optimal due to an inadequate volume of blood received in culture bottles   Culture   Final    NO GROWTH 3 DAYS Performed at [facility], [address] [place], [place] [address]    Report Status PENDING  Incomplete  Culture, blood (Routine x 2)     Status: None (Preliminary result)   Collection Time: 11/08/23  5:28 PM   Specimen: BLOOD  Result Value Ref Range Status   Specimen Description BLOOD SITE NOT SPECIFIED  Final   Special Requests   Final     BOTTLES DRAWN AEROBIC AND ANAEROBIC Blood Culture results may not be optimal due to an inadequate volume of blood received in culture bottles   Culture   Final    NO GROWTH 3 DAYS  Performed at [facility], [address] [place], [place] [address]    Report Status PENDING  Incomplete  Resp panel by RT-PCR (RSV, Flu A&B, Covid) Anterior Nasal Swab     Status: None   Collection Time: 11/09/23  3:29 AM   Specimen: Anterior Nasal Swab  Result Value Ref Range Status   SARS Coronavirus 2 by RT PCR NEGATIVE NEGATIVE Final   Influenza A by PCR NEGATIVE NEGATIVE Final   Influenza B by PCR NEGATIVE NEGATIVE Final    Comment: (NOTE) The Xpert Xpress SARS-CoV-2/FLU/RSV plus assay is intended as an aid in the diagnosis of influenza from Nasopharyngeal swab specimens and should not be used as a sole basis for treatment. Nasal washings and aspirates are unacceptable for Xpert Xpress SARS-CoV-2/FLU/RSV testing.  Fact Sheet for Patients: BloggerCourse.com  Fact Sheet for Healthcare Providers: SeriousBroker.it  This test is not yet approved or cleared by the [place]  FDA and has been authorized for detection and/or diagnosis of SARS-CoV-2 by FDA under an Emergency Use Authorization (EUA). This EUA will remain in effect (meaning this test can be used) for the duration of the COVID-19 declaration under Section 564(b)(1) of the Act, 21 U.S.C. section 360bbb-3(b)(1), unless the authorization is terminated or revoked.     Resp Syncytial Virus by PCR NEGATIVE NEGATIVE Final    Comment: (NOTE) Fact Sheet for Patients: BloggerCourse.com  Fact Sheet for Healthcare Providers: SeriousBroker.it  This test is not yet approved or cleared by the [place]  FDA and has been authorized for detection and/or diagnosis of SARS-CoV-2 by FDA under an Emergency Use Authorization (EUA). This EUA  will remain in effect (meaning this test can be used) for the duration of the COVID-19 declaration under Section 564(b)(1) of the Act, 21 U.S.C. section 360bbb-3(b)(1), unless the authorization is terminated or revoked.  Performed at [facility], [address], [place], [place] [address]   Respiratory (~20 pathogens) panel by PCR     Status: Abnormal   Collection Time: 11/09/23  3:29 AM   Specimen: Nasopharyngeal Swab; Respiratory  Result Value Ref Range Status   Adenovirus NOT DETECTED NOT DETECTED Final   Coronavirus 229E NOT DETECTED NOT DETECTED Final    Comment: (NOTE) The Coronavirus on the Respiratory Panel, DOES NOT test for the novel  Coronavirus (2019 nCoV)    Coronavirus HKU1 NOT DETECTED NOT DETECTED Final   Coronavirus NL63 NOT DETECTED NOT DETECTED Final   Coronavirus OC43 NOT DETECTED NOT DETECTED Final   Metapneumovirus NOT DETECTED NOT DETECTED Final   Rhinovirus / Enterovirus DETECTED (A) NOT DETECTED Final   Influenza A NOT DETECTED NOT DETECTED Final   Influenza B NOT DETECTED NOT DETECTED Final   Parainfluenza Virus 1 NOT DETECTED NOT DETECTED Final   Parainfluenza Virus 2 NOT DETECTED NOT DETECTED Final   Parainfluenza Virus 3 NOT DETECTED NOT DETECTED Final   Parainfluenza Virus 4 NOT DETECTED NOT DETECTED Final   Respiratory Syncytial Virus NOT DETECTED NOT DETECTED Final   Bordetella pertussis NOT DETECTED NOT DETECTED Final   Bordetella Parapertussis NOT DETECTED NOT DETECTED Final   Chlamydophila pneumoniae NOT DETECTED NOT DETECTED Final   Mycoplasma pneumoniae NOT DETECTED NOT DETECTED Final    Comment: Performed at [facility], [address], [place], [place] [address]  MRSA Next Gen by PCR, Nasal     Status: None   Collection Time: 11/09/23  4:57 AM  Result Value Ref Range Status   MRSA by PCR Next  Gen NOT DETECTED NOT DETECTED Final    Comment: (NOTE) The GeneXpert MRSA Assay (FDA approved for NASAL specimens only), is one  component of a comprehensive MRSA colonization surveillance program. It is not intended to diagnose MRSA infection nor to guide or monitor treatment for MRSA infections. Test performance is not FDA approved in patients less than 41 years old. Performed at [facility], [address] [place], [place] [address]     RADIOLOGY STUDIES/RESULTS: VAS US  LOWER EXTREMITY VENOUS (DVT) Result Date: 11/10/2023  Lower Venous DVT Study Patient Name:  JENEL RONAL PRETTY  Date of Exam:   11/10/2023 Medical Rec #: [id number]             Accession #:    [id number] Date of Birth: [birth year]             Patient Gender: M Patient Age:   77 years Exam Location:  [facility] Procedure:      VAS US  LOWER EXTREMITY VENOUS (DVT) Referring Phys: DONALDA APPLEBAUM --------------------------------------------------------------------------------  Indications: Swelling, SOB, and History of lung mass.  Risk Factors: CAD - Afib. Comparison Study: No priors. Performing Technologist: Ricka Sturdivant-Jones RDMS, RVT  Examination Guidelines: A complete evaluation includes B-mode imaging, spectral Doppler, color Doppler, and power Doppler as needed of all accessible portions of each vessel. Bilateral testing is considered an integral part of a complete examination. Limited examinations for reoccurring indications may be performed as noted. The reflux portion of the exam is performed with the patient in reverse Trendelenburg.  +---------+---------------+---------+-----------+----------+--------------+ RIGHT    CompressibilityPhasicitySpontaneityPropertiesThrombus Aging +---------+---------------+---------+-----------+----------+--------------+ CFV      Full           Yes      Yes                                 +---------+---------------+---------+-----------+----------+--------------+ SFJ      Full                                                         +---------+---------------+---------+-----------+----------+--------------+ FV Prox  Full                                                        +---------+---------------+---------+-----------+----------+--------------+ FV Mid   Full                                                        +---------+---------------+---------+-----------+----------+--------------+ FV DistalFull                                                        +---------+---------------+---------+-----------+----------+--------------+ PFV      Full                                                        +---------+---------------+---------+-----------+----------+--------------+  POP      Full                                                        +---------+---------------+---------+-----------+----------+--------------+ PTV      Partial        No       No                   Acute          +---------+---------------+---------+-----------+----------+--------------+ PERO     Partial        No       No                   Acute          +---------+---------------+---------+-----------+----------+--------------+ Acute DVT in the proximal segment only.  +---------+---------------+---------+-----------+----------+--------------+ LEFT     CompressibilityPhasicitySpontaneityPropertiesThrombus Aging +---------+---------------+---------+-----------+----------+--------------+ CFV      Full           Yes      Yes                                 +---------+---------------+---------+-----------+----------+--------------+ SFJ      None           No       No                   Acute          +---------+---------------+---------+-----------+----------+--------------+ FV Prox  Full                                                        +---------+---------------+---------+-----------+----------+--------------+ FV Mid   Full                                                         +---------+---------------+---------+-----------+----------+--------------+ FV DistalFull                                                        +---------+---------------+---------+-----------+----------+--------------+ PFV      Full                                                        +---------+---------------+---------+-----------+----------+--------------+ POP      Full           Yes      Yes                                 +---------+---------------+---------+-----------+----------+--------------+ PTV      None  No       No                   Acute          +---------+---------------+---------+-----------+----------+--------------+ PERO     Full                                                        +---------+---------------+---------+-----------+----------+--------------+ GSV      None           No       No                   Acute          +---------+---------------+---------+-----------+----------+--------------+ Superficial vein thrombosis in the proximal GSV less than 2 cm from the SFJ and proximal thigh.  Summary: RIGHT: - Findings consistent with acute deep vein thrombosis involving the right posterior tibial veins, and right peroneal veins.  - No cystic structure found in the popliteal fossa.  LEFT: - Findings consistent with acute deep vein thrombosis involving the left posterior tibial veins. - Findings consistent with acute superficial vein thrombosis involving the left great saphenous vein.  - No cystic structure found in the popliteal fossa.  *See table(s) above for measurements and observations. Electronically signed by Fonda Rim on 11/10/2023 at 2:45:48 PM.    Final    DG Chest Port 1V same Day Result Date: 11/10/2023 CLINICAL DATA:  Shortness of breath. EXAM: PORTABLE CHEST 1 VIEW COMPARISON:  Chest CT 11/08/2023 FINDINGS: Patient is rotated to the right. The cardio pericardial silhouette is enlarged. Asymmetric diffuse patchy airspace  disease noted without substantial pleural effusion. Left Port-A-Cath evident with distal tip overlying the region of the distal SVC near the RA junction. Age indeterminate anterior right rib fractures evident. Telemetry leads overlie the chest. IMPRESSION: Asymmetric diffuse patchy airspace disease. Imaging features compatible with multifocal pneumonia or asymmetric edema. Electronically Signed   By: Camellia Candle M.D.   On: 11/10/2023 08:18   ECHOCARDIOGRAM COMPLETE Result Date: 11/09/2023    ECHOCARDIOGRAM REPORT   Patient Name:   JENEL RONAL PRETTY Date of Exam: 11/09/2023 Medical Rec #:  [id number]            Height:       72.0 in Accession #:    [id number]           Weight:       152.1 lb Date of Birth:  06/02/1933            BSA:          1.897 m Patient Age:    89 years             BP:           118/74 mmHg Patient Gender: M                    HR:           89 bpm. Exam Location:  Inpatient Procedure: 2D Echo (Both Spectral and Color Flow Doppler were utilized during            procedure). Indications:    syncope  History:        Patient has prior history of Echocardiogram examinations, most  recent 05/11/2023. Lung cancer, Arrythmias:P-Afib,                 Signs/Symptoms:Shortness of Breath; Risk Factors:Former Smoker.  Sonographer:    Tinnie Barefoot RDCS Referring Phys: [id number] RATHORE  Sonographer Comments: Image acquisition challenging due to respiratory motion. Difficult to evaluate MR due to respiratory interference IMPRESSIONS  1. Left ventricular ejection fraction, by estimation, is 55 to 60%. The left ventricle has normal function. The left ventricle has no regional wall motion abnormalities. Left ventricular diastolic parameters are indeterminate.  2. Right ventricular systolic function is moderately reduced. The right ventricular size is normal. There is mildly elevated pulmonary artery systolic pressure. The estimated right ventricular systolic pressure is 44.5 mmHg.   3. The mitral valve is normal in structure. Mild to moderate mitral valve regurgitation. No evidence of mitral stenosis.  4. The aortic valve has an indeterminant number of cusps. Aortic valve regurgitation is not visualized. No aortic stenosis is present.  5. Aortic dilatation noted. 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  6. The inferior vena cava is normal in size with greater than 50% respiratory variability, suggesting right atrial pressure of 3 mmHg. FINDINGS  Left Ventricle: Left ventricular ejection fraction, by estimation, is 55 to 60%. The left ventricle has normal function. The left ventricle has no regional wall motion abnormalities. The left ventricular internal cavity size was normal in size. There is  no left ventricular hypertrophy. Left ventricular diastolic parameters are indeterminate. Right Ventricle: The right ventricular size is normal. No increase in right ventricular wall thickness. Right ventricular systolic function is moderately reduced. There is mildly elevated pulmonary artery systolic pressure. The tricuspid regurgitant velocity is 3.22 m/s, and with an assumed right atrial pressure of 3 mmHg, the estimated right ventricular systolic pressure is 44.5 mmHg. Left Atrium: Left atrial size was normal in size. Right Atrium: Right atrial size was normal in size. Pericardium: There is no evidence of pericardial effusion. Mitral Valve: The mitral valve is normal in structure. Mild to moderate mitral valve regurgitation. No evidence of mitral valve stenosis. Tricuspid Valve: The tricuspid valve is normal in structure. Tricuspid valve regurgitation is not demonstrated. No evidence of tricuspid stenosis. Aortic Valve: The aortic valve has an indeterminant number of cusps. Aortic valve regurgitation is not visualized. No aortic stenosis is present. Pulmonic Valve: The pulmonic valve was normal in structure. Pulmonic  valve regurgitation is not visualized. No evidence of pulmonic stenosis. Aorta: Aortic dilatation noted. 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 Venous: The inferior vena cava is normal in size with greater than 50% respiratory variability, suggesting right atrial pressure of 3 mmHg. IAS/Shunts: No atrial level shunt detected by color flow Doppler.  LEFT VENTRICLE PLAX 2D LVIDd:         3.90 cm     Diastology LVIDs:         3.00 cm     LV e' medial:  5.44 cm/s LV PW:         1.10 cm     LV e' lateral: 7.83 cm/s LV IVS:        1.20 cm LVOT diam:     2.20 cm LV SV:         49 LV SV Index:   26 LVOT Area:     3.80 cm  LV Volumes (MOD)  LV vol d, MOD A2C: 70.5 ml LV vol d, MOD A4C: 57.8 ml LV vol s, MOD A4C: 34.3 ml LV SV MOD A4C:     57.8 ml RIGHT VENTRICLE RV S prime:     7.18 cm/s TAPSE (M-mode): 1.3 cm LEFT ATRIUM             Index LA diam:        3.70 cm 1.95 cm/m LA Vol (A2C):   74.9 ml 39.49 ml/m LA Vol (A4C):   64.1 ml 33.80 ml/m LA Biplane Vol: 71.3 ml 37.59 ml/m  AORTIC VALVE LVOT Vmax:   78.50 cm/s LVOT Vmean:  50.100 cm/s LVOT VTI:    0.128 m  AORTA Ao Root diam: 4.30 cm TRICUSPID VALVE TR Peak grad:   41.5 mmHg TR Vmax:        322.00 cm/s  SHUNTS Systemic VTI:  0.13 m Systemic Diam: 2.20 cm Aditya Sabharwal Electronically signed by Ria Commander Signature Date/Time: 11/09/2023/4:54:25 PM    Final      LOS: 3 days   Donalda Applebaum, MD  [facility]    To contact the attending provider between 7A-7P or the covering provider during after hours 7P-7A, please log into the web site [email or web address] and access using universal [place] password for that web site. If you do not have the password, please call the hospital operator.  11/11/2023, 10:58 AM

## 2023-11-11 NOTE — Plan of Care (Signed)
  Problem: Education: Goal: Knowledge of General Education information will improve Description: Including pain rating scale, medication(s)/side effects and non-pharmacologic comfort measures Outcome: Progressing   Problem: Clinical Measurements: Goal: Will remain free from infection Outcome: Progressing Goal: Diagnostic test results will improve Outcome: Progressing Goal: Respiratory complications will improve Outcome: Progressing   Problem: Activity: Goal: Risk for activity intolerance will decrease Outcome: Progressing   Problem: Nutrition: Goal: Adequate nutrition will be maintained Outcome: Progressing   Problem: Coping: Goal: Level of anxiety will decrease Outcome: Progressing   

## 2023-11-11 NOTE — Telephone Encounter (Signed)
 Pharmacy Patient Advocate Encounter  Insurance verification completed.    The patient is insured through [facility]. Patient has Medicare and is not eligible for a copay card, but may be able to apply for patient assistance or Medicare RX Payment Plan (Patient Must reach out to their plan, if eligible for payment plan), if available.    Ran test claim for Eliquis  Starter Pack and the current 30 day co-pay is $268.08.  Ran test claim for Xarelto Starter Pack and the current 30 day co-pay is $268.08.  Copay applied the the deductible. Both are $47.00 after the deductible.  This test claim was processed through [facility]- copay amounts may vary at other pharmacies due to pharmacy/plan contracts, or as the patient moves through the different stages of their insurance plan.

## 2023-11-11 NOTE — Evaluation (Signed)
 Clinical/Bedside Swallow Evaluation Patient Details  Name: Stephen Meza MRN: [id number] Date of Birth: 11-07-1933  Today's Date: 11/11/2023 Time: SLP Start Time (ACUTE ONLY): 1010 SLP Stop Time (ACUTE ONLY): 1024 SLP Time Calculation (min) (ACUTE ONLY): 14 min  Past Medical History:  Past Medical History:  Diagnosis Date   Bladder cancer (HCC) 03/19/2016   DUODENITIS, WITHOUT HEMORRHAGE 07/14/2007   ERECTILE DYSFUNCTION 07/14/2007   Esophageal stricture 03/19/2016   ESOPHAGITIS 07/14/2007   EXTERNAL HEMORRHOIDS 07/14/2007   GERD 07/14/2007   Gout    Headache    HYPERTENSION 07/14/2007   PERIPHERAL NEUROPATHY 07/14/2007   Peripheral neuropathy 04/06/2019   PERIPHERAL VASCULAR DISEASE 07/14/2007   PODAGRA 07/14/2007   Prostate cancer (HCC) 02/2012   pt had screening and then biopsy   Prostate cancer (HCC) 04/13/2012   Mild rise in PSA lead to prostate biopsy - 1:11 positive for adenocarcinoma. CT abd/pelvis was negative except for enlarged prostate.     Treatment - hormonal     Retinal tear of right eye 03/19/2016   Stroke (HCC) 05/09/2023   TOBACCO ABUSE, HX OF 02/12/2010   TONSILLECTOMY, HX OF 07/14/2007   TREMOR, ESSENTIAL 07/14/2007   VITAMIN B12 DEFICIENCY 07/14/2007   Past Surgical History:  Past Surgical History:  Procedure Laterality Date   BRONCHIAL BIOPSY  07/26/2023   Procedure: BRONCHOSCOPY, WITH BIOPSY;  Surgeon: [name], MD;  Location[facility] ENDOSCOPY;  Service: Pulmonary;;   BRONCHIAL BRUSHINGS  07/26/2023   Procedure: BRONCHOSCOPY, WITH BRUSH BIOPSY;  Surgeon: [name], MD;  Location: [facility] ENDOSCOPY;  Service: Pulmonary;;   Excision of lipoma     IR IMAGING GUIDED PORT INSERTION  08/16/2023   PROSTATE BIOPSY Bilateral 03/10/2012   surgery to reduce turbinate and straighten deviated septum  2016   TONSILLECTOMY     TRANSCAROTID ARTERY REVASCULARIZATION  Right 05/14/2023   Procedure: TRANSCAROTID ARTERY REVASCULARIZATION (TCAR) UISNG 10mm X 40mm  ENROUTE TRANSCAROTID STENT SYSTEM;  Surgeon: Lanis Fonda BRAVO, MD;  Location: [facility] OR;  Service: Vascular;  Laterality: Right;   VIDEO BRONCHOSCOPY  07/26/2023   Procedure: VIDEO BRONCHOSCOPY WITHOUT FLUORO;  Surgeon: [name], MD;  Location: [facility] ENDOSCOPY;  Service: Pulmonary;;   HPI:  Stephen Meza is a 89 y.o. male with URI symptoms for ~1 week, who presented to the hospital with shortness of breath/weakness/numerous episode of syncope-patient was found to have acute hypoxic respiratory failure secondary to rhinovirus pneumonia.  CXR 9/17 concerning for multifocal pna. 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    Assessment / Plan / Recommendation  Clinical Impression  Pt presents with a mild oral dysphagia 2/2 edentulism. Pt is unable to wear lower partial denture at this time.  Fit is too be readjusted.  Wife and pt report they have been mashing up his food.  He was able to eat mashed potatoes last night, but mechanical soft meats were difficulty for him to chew.  Today pt tolerated all consistencies trialed without any clinical s/s of aspiration and exhibited good oral clearance of soft solids.  Discussed diet preference with pt and wife.  He would prefer a ground diet at this time.  SLP to follow for tolerance.  If pt is content with diet modifications likely does not need ST follow up.  If instrumental assessment would be beneficial for decision making or goals of care planning, please reach out to [facility]  SLP group via secure chat to schedule.    Recommend ground/minced diet with thin liquids.   SLP Visit Diagnosis: Dysphagia, oral phase (R13.11)    Aspiration Risk  Mild aspiration risk    Diet Recommendation Dysphagia 2 (Fine chop);Thin liquid    Liquid Administration via: Cup;Straw Medication Administration:  (As tolerated, no specific precautions) Supervision: Staff to  assist with self feeding Compensations: Slow rate;Small sips/bites Postural Changes: Seated upright at 90 degrees    Other  Recommendations Oral Care Recommendations: Oral care BID     Assistance Recommended at Discharge    Functional Status Assessment Patient has had a recent decline in their functional status and demonstrates the ability to make significant improvements in function in a reasonable and predictable amount of time.  Frequency and Duration min 2x/week  2 weeks       Prognosis Prognosis for improved oropharyngeal function: Good (with replacement of lower denture plate post adjustment)      Swallow Study   General Date of Onset: 11/10/23 HPI: Stephen Meza is a 89 y.o. male with URI symptoms for ~1 week, who presented to the hospital with shortness of breath/weakness/numerous episode of syncope-patient was found to have acute hypoxic respiratory failure secondary to rhinovirus pneumonia.  CXR 9/17 concerning for multifocal pna. 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 Type of Study: Bedside Swallow Evaluation Previous Swallow Assessment: None Diet Prior to this Study: Dysphagia 3 (mechanical soft);Thin liquids (Level 0) Temperature Spikes Noted: No Respiratory Status: Nasal cannula (HHFNC) History of Recent Intubation: No Behavior/Cognition: Alert;Cooperative;Pleasant mood Oral Cavity Assessment: Within Functional Limits Oral Care Completed by SLP: No Oral Cavity - Dentition: Missing dentition;Dentures, top (Bottom partial does not fit at this time.) Self-Feeding Abilities: Needs assist Patient Positioning: Upright in bed Baseline Vocal Quality: Normal Volitional Cough:  (Fair) Volitional Swallow: Able to elicit    Oral/Motor/Sensory Function Overall Oral Motor/Sensory Function: Within functional limits   Ice Chips Ice chips: Not tested   Thin  Liquid Thin Liquid: Within functional limits    Nectar Thick Nectar Thick Liquid: Not tested   Honey Thick Honey Thick Liquid: Not tested   Puree Puree: Within functional limits Presentation: Spoon   Solid     Solid: Within functional limits Presentation: Spoon Other Comments: soft solid      Anette FORBES Grippe, MA, CCC-SLP Acute Rehabilitation Services Office: [phone] 11/11/2023,10:47 AM

## 2023-11-11 NOTE — Evaluation (Signed)
 Occupational Therapy Evaluation Patient Details Name: Stephen Meza MRN: [id number] DOB: 03-28-1933 Today's Date: 11/11/2023   History of Present Illness   Pt is an 89 y/o M admitted on 11/08/23 after presenting with c/o SOB, weakness, numerous episodes of syncope. Pt is being treated for acute hypoxic respiratory failure with ARDS 2/2 rhinovirus PNA +/- small PE not seen on CTA chest, pt also has RLE DVT. PMH: Stage 3B squamous cell carcinoma of lung s/p 6 cycles of chemotherapy & 7 weeks of radiation, CVA, HTN, gout, bladder CA, neuropathy, PVD, essential tremor     Clinical Impressions Pt presents with decline in function and safety with ADLs and ADL mobility with impaired strength, balance and endurance. PTA pt lives with his wife and pt requires max assist at baseline since June/July with bathing and dressing, posterior toileting hygiene and used RW for mobility since starting chemo/radiation pt requiring less assistance prior to June, has been using RW since CVA in March, denies falls Pt currently requires extensive assist with ADLs/selfcare and standing pt with increased HR and decreasng O2 SATs with minimal exertion/effort. Co-tx with PT, pt's wife present for session. Pt currently  requires min A to sit EOB,  tolerates sitting EOB ~2 minutes before instructed to lie back down 2/2 elevated HR. Pt's SPO2 85% or > on supplemental O2, HR up to 172 bpm sitting EOB, no c/o symptoms from pt, HR returned to ~104 bpm after resting in bed ~4 minutes.Pt requires extensive assist with all ADLs/selfcare at this time. OT will follow acutely to maximize level of function and safety   If plan is discharge home, recommend the following:   A lot of help with bathing/dressing/bathroom;A lot of help with walking and/or transfers;Assist for transportation;Help with stairs or ramp for entrance     Functional Status Assessment   Patient has had a recent decline in their functional status and  demonstrates the ability to make significant improvements in function in a reasonable and predictable amount of time.     Equipment Recommendations   None recommended by OT     Recommendations for Other Services         Precautions/Restrictions   Precautions Precautions: Fall Precaution/Restrictions Comments: monitor HR, O2 Restrictions Weight Bearing Restrictions Per Provider Order: No     Mobility Bed Mobility Overal bed mobility: Needs Assistance Bed Mobility: Supine to Sit, Sit to Supine     Supine to sit: Min assist, HOB elevated, Used rails Sit to supine: Contact guard assist, HOB elevated, Used rails        Transfers                   General transfer comment: deferred standing/transfers due to increasing HR with minimal exertion seated EOB      Balance Overall balance assessment: Needs assistance Sitting-balance support: Feet supported Sitting balance-Leahy Scale: Fair Sitting balance - Comments: close supervision<>CGA static sitting                                   ADL either performed or assessed with clinical judgement   ADL Overall ADL's : Needs assistance/impaired Eating/Feeding: Set up   Grooming: Wash/dry hands;Wash/dry face;Contact guard assist   Upper Body Bathing: Maximal assistance   Lower Body Bathing: Total assistance   Upper Body Dressing : Maximal assistance   Lower Body Dressing: Total assistance       Toileting- Clothing Manipulation and  Hygiene: Total assistance;Bed level         General ADL Comments: pt requires max assist at baseline since June/July with bathing and dressing. Currently extensive assist required, pt with increased HR and decreasng O2 SATs with minimal exertion/effort since starting chemo/radiation     Vision Ability to See in Adequate Light: 0 Adequate Patient Visual Report: No change from baseline       Perception         Praxis         Pertinent Vitals/Pain Pain  Assessment Pain Assessment: No/denies pain Pain Score: 0-No pain Faces Pain Scale: No hurt Pain Intervention(s): Monitored during session, Repositioned     Extremity/Trunk Assessment Upper Extremity Assessment Upper Extremity Assessment: Generalized weakness   Lower Extremity Assessment Lower Extremity Assessment: Defer to PT evaluation   Cervical / Trunk Assessment Cervical / Trunk Assessment: Kyphotic   Communication Communication Communication: No apparent difficulties   Cognition Arousal: Alert Behavior During Therapy: WFL for tasks assessed/performed                                 Following commands: Intact       Cueing  General Comments   Cueing Techniques: Verbal cues      Exercises     Shoulder Instructions      Home Living Family/patient expects to be discharged to:: Private residence Living Arrangements: Spouse/significant other Available Help at Discharge: Family Type of Home: House Home Access: Stairs to enter Secretary/administrator of Steps: 4 Entrance Stairs-Rails: Right;Left;Can reach both Home Layout: Multi-level Alternate Level Stairs-Number of Steps: 1 Alternate Level Stairs-Rails: Right;Left;Can reach both Bathroom Shower/Tub: Chief Strategy Officer: Standard     Home Equipment: Agricultural consultant (2 wheels);Shower seat;Grab bars - toilet;Grab bars - tub/shower;Wheelchair - manual          Prior Functioning/Environment Prior Level of Function : Needs assist             Mobility Comments: Has been using RW since CVA in March, denies falls ADLs Comments: Since starting chemo/radiation pt requiring more assistance (peri hygiene after BM, assist with bathing, dressing)    OT Problem List: Decreased strength;Decreased knowledge of precautions;Decreased activity tolerance;Impaired balance (sitting and/or standing)   OT Treatment/Interventions: Self-care/ADL training;Patient/family education;Therapeutic  activities;DME and/or AE instruction      OT Goals(Current goals can be found in the care plan section)   Acute Rehab OT Goals Patient Stated Goal: none stated OT Goal Formulation: With patient/family Time For Goal Achievement: 11/25/23 Potential to Achieve Goals: Fair   OT Frequency:  Min 2X/week    Co-evaluation PT/OT/SLP Co-Evaluation/Treatment: Yes Reason for Co-Treatment: Complexity of the patient's impairments (multi-system involvement) PT goals addressed during session: Balance;Mobility/safety with mobility OT goals addressed during session: ADL's and self-care      AM-PAC OT 6 Clicks Daily Activity     Outcome Measure Help from another person eating meals?: A Little Help from another person taking care of personal grooming?: A Little Help from another person toileting, which includes using toliet, bedpan, or urinal?: Total Help from another person bathing (including washing, rinsing, drying)?: A Lot Help from another person to put on and taking off regular upper body clothing?: A Lot Help from another person to put on and taking off regular lower body clothing?: Total 6 Click Score: 12   End of Session Nurse Communication: Mobility status  Activity Tolerance: Patient limited by fatigue;Other (  comment) (increasing HR, decreasing O2 SATs) Patient left: in bed;with call bell/phone within reach;with family/visitor present  OT Visit Diagnosis: Muscle weakness (generalized) (M62.81)                Time: 8796-8773 OT Time Calculation (min): 23 min Charges:  OT General Charges $OT Visit: 1 Visit OT Evaluation $OT Eval Moderate Complexity: 1 Mod    Jacques Karna Loose 11/11/2023, 1:33 PM

## 2023-11-12 ENCOUNTER — Encounter (HOSPITAL_COMMUNITY): Payer: Self-pay | Admitting: Internal Medicine

## 2023-11-12 DIAGNOSIS — Z789 Other specified health status: Secondary | ICD-10-CM

## 2023-11-12 DIAGNOSIS — J9601 Acute respiratory failure with hypoxia: Secondary | ICD-10-CM | POA: Diagnosis not present

## 2023-11-12 DIAGNOSIS — Z7189 Other specified counseling: Secondary | ICD-10-CM | POA: Diagnosis not present

## 2023-11-12 DIAGNOSIS — Z66 Do not resuscitate: Secondary | ICD-10-CM | POA: Diagnosis not present

## 2023-11-12 DIAGNOSIS — Z711 Person with feared health complaint in whom no diagnosis is made: Secondary | ICD-10-CM

## 2023-11-12 DIAGNOSIS — Z515 Encounter for palliative care: Secondary | ICD-10-CM | POA: Diagnosis not present

## 2023-11-12 DIAGNOSIS — J700 Acute pulmonary manifestations due to radiation: Secondary | ICD-10-CM | POA: Diagnosis not present

## 2023-11-12 DIAGNOSIS — R55 Syncope and collapse: Secondary | ICD-10-CM | POA: Diagnosis not present

## 2023-11-12 DIAGNOSIS — R0602 Shortness of breath: Secondary | ICD-10-CM

## 2023-11-12 NOTE — Plan of Care (Signed)
  Problem: Clinical Measurements: Goal: Ability to maintain clinical measurements within normal limits will improve 11/12/2023 0528 by Dorice Norlene SAUNDERS, RN Outcome: Progressing 11/12/2023 0527 by Dorice Norlene SAUNDERS, RN Outcome: Progressing Goal: Will remain free from infection 11/12/2023 0528 by Dorice Norlene SAUNDERS, RN Outcome: Progressing 11/12/2023 0527 by Dorice Norlene SAUNDERS, RN Outcome: Progressing Goal: Diagnostic test results will improve 11/12/2023 0528 by Dorice Norlene SAUNDERS, RN Outcome: Progressing 11/12/2023 0527 by Dorice Norlene SAUNDERS, RN Outcome: Progressing Goal: Respiratory complications will improve 11/12/2023 0528 by Dorice Norlene SAUNDERS, RN Outcome: Not Progressing 11/12/2023 0527 by Dorice Norlene SAUNDERS, RN Outcome: Progressing Goal: Cardiovascular complication will be avoided 11/12/2023 0528 by Dorice Norlene SAUNDERS, RN Outcome: Progressing 11/12/2023 0527 by Dorice Norlene SAUNDERS, RN Outcome: Progressing   Problem: Activity: Goal: Risk for activity intolerance will decrease 11/12/2023 0528 by Dorice Norlene SAUNDERS, RN Outcome: Not Progressing 11/12/2023 0527 by Dorice Norlene SAUNDERS, RN Outcome: Progressing   Problem: Coping: Goal: Level of anxiety will decrease 11/12/2023 0528 by Dorice Norlene SAUNDERS, RN Outcome: Progressing 11/12/2023 0527 by Dorice Norlene SAUNDERS, RN Outcome: Progressing

## 2023-11-12 NOTE — Plan of Care (Signed)
 Brief Palliative Medicine Progress Note:  Met with patient and his wife - full note to follow.  Recommendations/Plan Continue current supportive treatment without escalation of care Continue DNR-comfort as previously documented Plan for transition to full comfort and weaning of oxygen tomorrow 9/20 after family have visited this afternoon Anticipate hospital death PMT will continue to follow and support holistically  Thank you for allowing PMT to assist in the care of this patient.  Chadrick Sprinkle M. [facility] Palliative Medicine Team Team Phone: [phone] NO CHARGE

## 2023-11-12 NOTE — TOC Progression Note (Signed)
 Transition of Care (TOC) - Progression Note    Patient Details  Name: Stephen Meza MRN: [id number] Date of Birth: 08/17/1933  Transition of [facility]) CM/SW Contact  Inocente GORMAN Kindle, LCSW Phone Number: 11/12/2023, 9:06 AM  Clinical Narrative:    CSW continuing to follow for needs. Patient continues on 45L of oxygen.   Expected Discharge Plan: Comfort Barriers to Discharge: Continued Medical Work up               Expected Discharge Plan and Services   Discharge Planning Services: CM Consult   Living arrangements for the past 2 months: Single Family Home                                       Social Drivers of Health (SDOH) Interventions SDOH Screenings   Food Insecurity: Unknown (11/09/2023)  Housing: Unknown (11/09/2023)  Transportation Needs: Unknown (11/09/2023)  Utilities: Not At Risk (11/09/2023)  Alcohol Screen: Low Risk  (07/07/2023)  Depression (PHQ2-9): Low Risk  (11/02/2023)  Financial Resource Strain: Low Risk  (07/07/2023)  Physical Activity: Sufficiently Active (07/07/2023)  Social Connections: Socially Integrated (11/09/2023)  Stress: No Stress Concern Present (07/07/2023)  Tobacco Use: Medium Risk (11/08/2023)  Health Literacy: Adequate Health Literacy (07/07/2023)    Readmission Risk Interventions     No data to display

## 2023-11-12 NOTE — Progress Notes (Signed)
 Daily Progress Note   Patient Name: Stephen Meza       Date: 11/12/2023 DOB: 08-20-1933  Age: 89 y.o. MRN#: [id number] Attending Physician: Raenelle Donalda HERO, MD Primary Care Physician: [name], FNP Admit Date: 11/08/2023  Reason for Consultation/Follow-up: Establishing goals of care  Subjective: I have reviewed medical records including EPIC notes, MAR, any available advanced directives as necessary, and labs. Received report from primary RN - no acute concerns.  Went to visit patient at bedside - wife/Martha and friend/Kyle present. Patient was lying in bed awake, alert, oriented, and able to participate in conversation. He is tired and ill appearing. He is on 45L HFNC. No respiratory distress, increased work of breathing, or secretions noted.   Emotional support provided to wife and patient. Patient is ready for transition to comfort measures as soon as his family visits - they plan to be here later this evening. Patient and wife plan for transition to comfort measures, including weaning oxygen, tomorrow and understand we would anticipate hospital death.  All questions and concerns addressed. Encouraged to call with questions and/or concerns. PMT card provided.  Length of Stay: 4  Current Medications: Scheduled Meds:   acetylcysteine   3 mL Nebulization TID   allopurinol   100 mg Oral Daily   apixaban   10 mg Oral BID   Followed by   NOREEN ON 11/18/2023] apixaban   5 mg Oral BID   aspirin  EC  81 mg Oral Daily   ezetimibe   10 mg Oral Daily   ipratropium  0.5 mg Nebulization Q6H   levalbuterol   0.63 mg Nebulization Q6H   methylPREDNISolone  (SOLU-MEDROL ) injection  100 mg Intravenous Q12H   midodrine   5 mg Oral TID WC   pantoprazole   40 mg Oral Daily    Continuous  Infusions:   PRN Meds: acetaminophen  **OR** acetaminophen , benzonatate , glycopyrrolate , LORazepam , melatonin, metoprolol  tartrate, morphine  injection, mouth rinse  Physical Exam Vitals and nursing note reviewed.  Constitutional:      General: He is not in acute distress.    Appearance: He is ill-appearing.  Pulmonary:     Effort: No respiratory distress.  Skin:    General: Skin is warm and dry.  Neurological:     Mental Status: He is alert and oriented to person, place, and time.  Psychiatric:  Attention and Perception: Attention normal.        Behavior: Behavior is cooperative.        Cognition and Memory: Cognition and memory normal.             Vital Signs: BP (!) 154/90 (BP Location: Right Arm)   Pulse 91   Temp 98.6 F (37 C) (Axillary)   Resp (!) 23   Ht 6' (1.829 m)   Wt 69 kg   SpO2 90%   BMI 20.63 kg/m  SpO2: SpO2: 90 % O2 Device: O2 Device: Heated High Flow Nasal Cannula, NRB O2 Flow Rate: O2 Flow Rate (L/min): 45 L/min (15L NRB)  Intake/output summary:  Intake/Output Summary (Last 24 hours) at 11/12/2023 1028 Last data filed at 11/12/2023 0737 Gross per 24 hour  Intake --  Output 1700 ml  Net -1700 ml   LBM: Last BM Date : 11/09/23 Baseline Weight: Weight: 69 kg Most recent weight: Weight: 69 kg       Palliative Assessment/Data: PPS 20%      Patient Active Problem List   Diagnosis Date Noted   Palliative care by specialist 11/11/2023   DNR (do not resuscitate) 11/11/2023   Syncope and collapse 11/11/2023   Shortness of breath 11/11/2023   Syncope 11/09/2023   Elevated troponin 11/09/2023   Paroxysmal A-fib (HCC) 11/09/2023   Non-pressure chronic ulcer of other part of right foot limited to breakdown of skin (HCC) 11/08/2023   Acute hypoxemic respiratory failure (HCC) 11/08/2023   Hyponatremia 11/07/2023   Anemia of chronic disease 11/07/2023   Encounter for antineoplastic chemotherapy 08/31/2023   Port-A-Cath in place 08/30/2023    Malignant neoplasm of lower lobe of right lung (HCC) 08/08/2023   Weight loss 07/29/2023   Chronic right shoulder pain 07/29/2023   Lung nodule seen on imaging study 07/22/2023   Prediabetes 06/29/2023   Medication management 06/29/2023   Statin myopathy 06/18/2023   Chronic constipation 06/18/2023   Primary osteoarthritis of both knees 06/18/2023   Dysuria 06/18/2023   Chronic pain of both knees 06/18/2023   History of stroke 06/18/2023   Generalized weakness 06/18/2023   Acute CVA (cerebrovascular accident) (HCC) 05/10/2023   History of gout 05/10/2023   Stroke (HCC) 05/10/2023   Abnormal breath sounds 03/29/2023   Mass of right lung 03/22/2023   Arrhythmia 11/10/2022   Anemia 05/03/2021   Tick bite, infected 08/07/2020   Peripheral neuropathy 04/06/2019   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   Low grade fever 04/06/2019   Bilateral leg pain 04/04/2018   Bilateral bunions 04/04/2018   Ganglion cyst of dorsum of right wrist 04/04/2018   Chronic low back pain 04/04/2018   Gait disorder 04/04/2018   Recurrent falls while walking 04/04/2018   Abrasion of knee, bilateral 04/04/2018   Esophageal stricture 03/19/2016   Retinal tear of right eye 03/19/2016   Dizziness 03/19/2016   Hyperglycemia 03/16/2015   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  male  with past medical history of Afib, CVA (04/2023), lung cancer s/p CRT, hx of prostate and bladder cancer admitted on 11/08/2023 with acute hypoxic respiratory failure 2/2 rhinovirus pneumonia. Patient presented to ED with shortness of breath and syncope in wheelchair without fall after completing CRT 1 month ago for lung cancer. During hospital stay the patient has increased oxygen requirement and now on heated high flow [place] at 40 L/min and 80% FiO2. Palliative consulted for discussion of goals of care.   Assessment: Principal Problem:   Acute hypoxemic respiratory failure (HCC) Active Problems:   Hyponatremia   Syncope   Elevated troponin   Paroxysmal A-fib (HCC)   Palliative care by specialist   DNR (do not resuscitate)   Concern about end of life  Recommendations/Plan Continue current supportive treatment without escalation of care Continue DNR-comfort as previously documented Plan for transition to full comfort and weaning of oxygen tomorrow 9/20 after family have visited this afternoon Anticipate hospital death PMT will continue to follow and support holistically  Goals of Care and Additional Recommendations: Limitations on Scope of Treatment: Avoid Hospitalization, Minimize Medications, and No Tracheostomy  Code Status:    Code Status Orders  (From admission, onward)           Start     Ordered   11/11/23 1531  Do not attempt resuscitation (DNR) - Comfort care  (Code Status)  Continuous       Question Answer Comment  If patient has no pulse and is not breathing Do Not Attempt Resuscitation   In Pre-Arrest Conditions (Patient Is Breathing and Has a Pulse) Provide comfort measures. Relieve any mechanical airway obstruction. Avoid transfer unless required for comfort.   Consent: Discussion documented in EHR or advanced directives reviewed      11/11/23 1531           Code Status History     Date Active Date Inactive Code Status Order ID Comments User Context    11/09/2023 0054 11/11/2023 1531 Limited: Do not attempt resuscitation (DNR) -DNR-LIMITED -Do Not Intubate/DNI  [id number]  Alfornia Madison, MD ED   11/09/2023 0021 11/09/2023 0054 Limited: Do not attempt resuscitation (DNR) -DNR-LIMITED -Do Not Intubate/DNI  [id number]  Alfornia Madison, MD ED   08/16/2023 0907 08/17/2023 0516 Full Code [id number]  Hughes Simmonds, MD HOV   05/10/2023 0703 05/15/2023 1755 Full Code [id number]  Franky Redia SAILOR, MD ED       Prognosis:  Hours - Days after oxygen wean  Discharge Planning: Anticipated Hospital Death  Care plan was discussed with primary RN, patient, patient's wife, Dr. Dennise  Thank you for allowing the Palliative Medicine Team to assist in the care of this patient.     Jeoffrey CHRISTELLA Sharps, NP  Please contact Palliative Medicine Team phone at [phone] for questions and concerns.   *Portions of this note are a verbal dictation therefore any spelling and/or grammatical errors are due to the Dragon Medical One system interpretation.

## 2023-11-12 NOTE — Progress Notes (Signed)
 PROGRESS NOTE        PATIENT DETAILS Name: Stephen Meza Age: 89 y.o. Sex: male Date of Birth: 07/16/1933 Admit Date: 11/08/2023 Admitting Physician Editha Ram, MD ERE:Fjuuyztd, [name], FNP  Brief Summary: Patient is a 89 y.o.  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  Significant events: 9/15>> admit to [facility]  Significant studies: 915>> CT angio chest: Multilobar large areas of ground glass opacities-no large filling defect. 9/16>> echo: EF 55-60% 9/17>> B/L LE Doppler:+ DVT in RLE  Significant microbiology data: 9/15>> blood culture: No growth 9/16>> COVID/influenza/RSV PCR: Negative 9/16>> respiratory virus panel:+ve rhinovirus  Procedures: None  Consults: PCCM Palliative care  Subjective: Essentially unchanged-on HHFNC and 100% NRB-looks frail.  Spoke with wife-they are awaiting arrival of her granddaughter from [place] today-with tentative plans to perhaps transition to comfort measures over the weekend.  Objective: Vitals: Blood pressure (!) 154/90, pulse 91, temperature 98.6 F (37 C), temperature source Axillary, resp. rate (!) 23, height 6' (1.829 m), weight 69 kg, SpO2 90%.   Exam: Gen Exam: Frail-awake/alert HEENT:atraumatic, normocephalic Chest: Few bibasilar rales CVS:S1S2 regular Abdomen:soft non tender, non distended Extremities:no edema Neurology: Non focal Skin: no rash  Pertinent Labs/Radiology:    Latest Ref Rng & Units 11/11/2023    3:20 AM 11/10/2023    3:46 AM 11/09/2023    5:13 PM  CBC  WBC 4.0 - 10.5 K/uL 6.8  11.2  9.9   Hemoglobin 13.0 - 17.0 g/dL 8.3   8.3  89.8   Hematocrit 39.0 - 52.0 % 26.4  26.0  32.6   Platelets 150 - 400 K/uL 200  216  249     Lab Results  Component Value Date   NA 135 11/11/2023   K 3.8 11/11/2023   CL 100 11/11/2023   CO2 22 11/11/2023      Assessment/Plan: Acute hypoxic respiratory failure with ARDS secondary to rhinovirus pneumonia +/- small pulm embolism not seen on CTA chest (has RLE DVT on Doppler) Continue to have severe hypoxemia-on 100% FiO2 via HFNC at 40 L/min and 100% FiO2 through NRB Continue Solu-Medrol /bronchodilators I have had long discussion with family-palliative care evaluated on 9/18-plans are at this time to continue supportive care-allow arrival of other family members (1 granddaughter to come from [place] later today) and then perhaps transition to full comfort measures over the weekend.    RLE DVT Eliquis   Syncope Possibly orthostatic/vasovagal mechanism in the setting of infection/coughing/dehydration-but with DVT-small transient PE also possible. 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  HTN BP stable-but on midodrine  All antihypertensives held.  Minimally elevated troponin-likely type II non-STEMI/demand ischemia Supportive care Echo stable Doubt further workup required  Gout Not in flare Continue allopurinol   GERD PPI  Advanced directive/palliative care DNR in place Family leaning towards transitioning to  comfort measures over the weekend-more family members-granddaughter to arrive from [place] today. No further escalation in care-no need for rapid response-no ICU transfer if patient deteriorates while awaiting  family member arrival-Will transition to comfort measures.  The patient is critically ill with multiple organ system failure and requires high complexity decision making for assessment and support, frequent evaluation and titration of therapies, advanced monitoring, review of radiographic studies and interpretation of complex data.   Total Critical time spent equals 45 minutes  Code status:   Code Status: Do not attempt resuscitation (DNR) - Comfort care   DVT Prophylaxis: SCDs Start: 11/09/23 0048 apixaban  (ELIQUIS ) tablet 10 mg  apixaban  (ELIQUIS ) tablet 5 mg Full dose Lovenox    Family Communication: Spouse/2 daughters at bedside   Disposition Plan: Status is: Inpatient Remains inpatient appropriate because: Severe illness   Planned Discharge Destination: TBD remained   Diet: Diet Order             DIET DYS 2 Room service appropriate? No; Fluid consistency: Thin  Diet effective now                     Antimicrobial agents: Anti-infectives (From admission, onward)    Start     Dose/Rate Route Frequency Ordered Stop   11/10/23 1000  azithromycin  (ZITHROMAX ) tablet 500 mg        500 mg Oral Daily 11/09/23 0827 11/11/23 0913   11/09/23 2200  cefTRIAXone  (ROCEPHIN ) 1 g in sodium chloride  0.9 % 100 mL IVPB  Status:  Discontinued        1 g 200 mL/hr over 30 Minutes Intravenous Every 24 hours 11/09/23 0125 11/09/23 0959   11/09/23 0200  azithromycin  (ZITHROMAX ) 500 mg in sodium chloride  0.9 % 250 mL IVPB  Status:  Discontinued        500 mg 250 mL/hr over 60 Minutes Intravenous Every 24 hours 11/09/23 0125 11/09/23 0827   11/09/23 0130  vancomycin  (VANCOREADY) IVPB 1500 mg/300 mL        1,500 mg 150 mL/hr over 120 Minutes Intravenous  Once 11/09/23 0125 11/09/23 0543   11/09/23 0130  cefTRIAXone  (ROCEPHIN ) 1 g in sodium chloride  0.9 % 100 mL IVPB        1 g 200 mL/hr over 30 Minutes Intravenous  Once 11/09/23 0127 11/09/23 0322        MEDICATIONS: Scheduled  Meds:  acetylcysteine   3 mL Nebulization TID   allopurinol   100 mg Oral Daily   apixaban   10 mg Oral BID   Followed by   NOREEN ON 11/18/2023] apixaban   5 mg Oral BID   aspirin  EC  81 mg Oral Daily   ezetimibe   10 mg Oral Daily   ipratropium  0.5 mg Nebulization Q6H   levalbuterol   0.63 mg Nebulization Q6H   methylPREDNISolone  (SOLU-MEDROL ) injection  100 mg Intravenous Q12H   midodrine   5 mg Oral TID WC   pantoprazole   40 mg Oral Daily   Continuous Infusions:   PRN Meds:.acetaminophen  **OR** acetaminophen , benzonatate , glycopyrrolate , LORazepam , melatonin, metoprolol  tartrate, morphine  injection, mouth rinse   I have personally reviewed following labs and imaging studies  LABORATORY DATA: CBC: Recent Labs  Lab 11/08/23 1716 11/09/23 1713 11/10/23 0346 11/11/23 0320  WBC 9.0 9.9 11.2* 6.8  NEUTROABS 8.0*  --   --   --   HGB 8.2* 10.1* 8.3* 8.3*  HCT 27.0* 32.6* 26.0* 26.4*  MCV 96.1 93.4 92.2 93.0  PLT 213 249 216 200  Basic Metabolic Panel: Recent Labs  Lab 11/08/23 1716 11/09/23 0413 11/09/23 1713 11/10/23 0346 11/11/23 0320  NA 126* 131* 132* 131* 135  K 3.8 3.2* 4.4 3.6 3.8  CL 95* 101 100 102 100  CO2 19* 19* 22 19* 22  GLUCOSE 141* 117* 145* 116* 123*  BUN 29* 19 20 27* 23  CREATININE 0.79 0.69 0.73 0.77 0.68  CALCIUM  8.0* 7.9* 8.7* 8.3* 8.5*  MG  --   --  2.1 1.9  --   PHOS  --   --  3.7 3.9  3.8  --     GFR: Estimated Creatinine Clearance: 62.3 mL/min (by C-G formula based on SCr of 0.68 mg/dL).  Liver Function Tests: Recent Labs  Lab 11/08/23 1716 11/10/23 0346 11/11/23 0320  AST 36  --  45*  ALT 30  --  42  ALKPHOS 81  --  82  BILITOT 0.5  --  0.6  PROT 5.9*  --  5.7*  ALBUMIN  2.3* 2.4* 2.5*   No results for input(s): LIPASE, AMYLASE in the last 168 hours. No results for input(s): AMMONIA in the last 168 hours.  Coagulation Profile: Recent Labs  Lab 11/08/23 1716  INR 1.3*    Cardiac Enzymes: No results for  input(s): CKTOTAL, CKMB, CKMBINDEX, TROPONINI in the last 168 hours.  BNP (last 3 results) No results for input(s): PROBNP in the last 8760 hours.  Lipid Profile: No results for input(s): CHOL, HDL, LDLCALC, TRIG, CHOLHDL, LDLDIRECT in the last 72 hours.  Thyroid  Function Tests: No results for input(s): TSH, T4TOTAL, FREET4, T3FREE, THYROIDAB in the last 72 hours.  Anemia Panel: No results for input(s): VITAMINB12, FOLATE, FERRITIN, TIBC, IRON , RETICCTPCT in the last 72 hours.  Urine analysis:    Component Value Date/Time   COLORURINE YELLOW 11/08/2023 2319   APPEARANCEUR CLEAR 11/08/2023 2319   LABSPEC 1.023 11/08/2023 2319   PHURINE 6.0 11/08/2023 2319   GLUCOSEU NEGATIVE 11/08/2023 2319   GLUCOSEU NEGATIVE 05/21/2023 1448   HGBUR NEGATIVE 11/08/2023 2319   BILIRUBINUR NEGATIVE 11/08/2023 2319   KETONESUR NEGATIVE 11/08/2023 2319   PROTEINUR NEGATIVE 11/08/2023 2319   UROBILINOGEN 1.0 05/21/2023 1448   NITRITE NEGATIVE 11/08/2023 2319   LEUKOCYTESUR NEGATIVE 11/08/2023 2319    Sepsis Labs: Lactic Acid, Venous    Component Value Date/Time   LATICACIDVEN 1.2 11/08/2023 2331    MICROBIOLOGY: Recent Results (from the past 240 hours)  Culture, blood (Routine x 2)     Status: None (Preliminary result)   Collection Time: 11/08/23  5:15 PM   Specimen: BLOOD  Result Value Ref Range Status   Specimen Description BLOOD SITE NOT SPECIFIED  Final   Special Requests   Final    BOTTLES DRAWN AEROBIC AND ANAEROBIC Blood Culture results may not be optimal due to an inadequate volume of blood received in culture bottles   Culture   Final    NO GROWTH 4 DAYS Performed at [facility], [address], [place], [place] [address]    Report Status PENDING  Incomplete  Culture, blood (Routine x 2)     Status: None (Preliminary result)   Collection Time: 11/08/23  5:28 PM   Specimen: BLOOD  Result Value Ref Range Status   Specimen  Description BLOOD SITE NOT SPECIFIED  Final   Special Requests   Final    BOTTLES DRAWN AEROBIC AND ANAEROBIC Blood Culture results may not be optimal due to an inadequate volume of blood received in culture bottles  Culture   Final    NO GROWTH 4 DAYS Performed at [facility], [address] [place], [place] [address]    Report Status PENDING  Incomplete  Resp panel by RT-PCR (RSV, Flu A&B, Covid) Anterior Nasal Swab     Status: None   Collection Time: 11/09/23  3:29 AM   Specimen: Anterior Nasal Swab  Result Value Ref Range Status   SARS Coronavirus 2 by RT PCR NEGATIVE NEGATIVE Final   Influenza A by PCR NEGATIVE NEGATIVE Final   Influenza B by PCR NEGATIVE NEGATIVE Final    Comment: (NOTE) The Xpert Xpress SARS-CoV-2/FLU/RSV plus assay is intended as an aid in the diagnosis of influenza from Nasopharyngeal swab specimens and should not be used as a sole basis for treatment. Nasal washings and aspirates are unacceptable for Xpert Xpress SARS-CoV-2/FLU/RSV testing.  Fact Sheet for Patients: BloggerCourse.com  Fact Sheet for Healthcare Providers: SeriousBroker.it  This test is not yet approved or cleared by the [place]  FDA and has been authorized for detection and/or diagnosis of SARS-CoV-2 by FDA under an Emergency Use Authorization (EUA). This EUA will remain in effect (meaning this test can be used) for the duration of the COVID-19 declaration under Section 564(b)(1) of the Act, 21 U.S.C. section 360bbb-3(b)(1), unless the authorization is terminated or revoked.     Resp Syncytial Virus by PCR NEGATIVE NEGATIVE Final    Comment: (NOTE) Fact Sheet for Patients: BloggerCourse.com  Fact Sheet for Healthcare Providers: SeriousBroker.it  This test is not yet approved or cleared by the [place]  FDA and has been authorized for detection and/or diagnosis of  SARS-CoV-2 by FDA under an Emergency Use Authorization (EUA). This EUA will remain in effect (meaning this test can be used) for the duration of the COVID-19 declaration under Section 564(b)(1) of the Act, 21 U.S.C. section 360bbb-3(b)(1), unless the authorization is terminated or revoked.  Performed at [facility], [address], [place], [place] [address]   Respiratory (~20 pathogens) panel by PCR     Status: Abnormal   Collection Time: 11/09/23  3:29 AM   Specimen: Nasopharyngeal Swab; Respiratory  Result Value Ref Range Status   Adenovirus NOT DETECTED NOT DETECTED Final   Coronavirus 229E NOT DETECTED NOT DETECTED Final    Comment: (NOTE) The Coronavirus on the Respiratory Panel, DOES NOT test for the novel  Coronavirus (2019 nCoV)    Coronavirus HKU1 NOT DETECTED NOT DETECTED Final   Coronavirus NL63 NOT DETECTED NOT DETECTED Final   Coronavirus OC43 NOT DETECTED NOT DETECTED Final   Metapneumovirus NOT DETECTED NOT DETECTED Final   Rhinovirus / Enterovirus DETECTED (A) NOT DETECTED Final   Influenza A NOT DETECTED NOT DETECTED Final   Influenza B NOT DETECTED NOT DETECTED Final   Parainfluenza Virus 1 NOT DETECTED NOT DETECTED Final   Parainfluenza Virus 2 NOT DETECTED NOT DETECTED Final   Parainfluenza Virus 3 NOT DETECTED NOT DETECTED Final   Parainfluenza Virus 4 NOT DETECTED NOT DETECTED Final   Respiratory Syncytial Virus NOT DETECTED NOT DETECTED Final   Bordetella pertussis NOT DETECTED NOT DETECTED Final   Bordetella Parapertussis NOT DETECTED NOT DETECTED Final   Chlamydophila pneumoniae NOT DETECTED NOT DETECTED Final   Mycoplasma pneumoniae NOT DETECTED NOT DETECTED Final    Comment: Performed at [facility], [address], [place], [place] [address]  MRSA Next Gen by PCR, Nasal     Status: None   Collection Time: 11/09/23  4:57 AM  Result Value Ref Range Status   MRSA by PCR Next Gen NOT DETECTED NOT DETECTED Final    Comment: (NOTE) The  GeneXpert MRSA Assay (FDA approved for NASAL specimens only), is one component of a comprehensive MRSA colonization surveillance program. It is not intended to diagnose MRSA infection nor to guide or monitor treatment for MRSA infections. Test performance is not FDA approved in patients less than 49 years old. Performed at [facility], [address] [place], [place] [address]     RADIOLOGY STUDIES/RESULTS: No results found.    LOS: 4 days   Donalda Applebaum, MD  [facility]    To contact the attending provider between 7A-7P or the covering provider during after hours 7P-7A, please log into the web site [email or web address] and access using universal [place] password for that web site. If you do not have the password, please call the hospital operator.  11/12/2023, 10:08 AM

## 2023-11-13 DIAGNOSIS — E871 Hypo-osmolality and hyponatremia: Secondary | ICD-10-CM | POA: Diagnosis not present

## 2023-11-13 DIAGNOSIS — R7989 Other specified abnormal findings of blood chemistry: Secondary | ICD-10-CM | POA: Diagnosis not present

## 2023-11-13 DIAGNOSIS — Z515 Encounter for palliative care: Secondary | ICD-10-CM | POA: Diagnosis not present

## 2023-11-13 DIAGNOSIS — J9601 Acute respiratory failure with hypoxia: Secondary | ICD-10-CM | POA: Diagnosis not present

## 2023-11-13 DIAGNOSIS — Z7189 Other specified counseling: Secondary | ICD-10-CM

## 2023-11-13 LAB — CULTURE, BLOOD (ROUTINE X 2)
Culture: NO GROWTH
Culture: NO GROWTH

## 2023-11-13 MED ORDER — MORPHINE BOLUS VIA INFUSION
1.0000 mg | INTRAVENOUS | Status: DC | PRN
  Administered 2023-11-13 (×3): 4 mg via INTRAVENOUS
  Administered 2023-11-13: 1 mg via INTRAVENOUS
  Administered 2023-11-13: 2 mg via INTRAVENOUS
  Administered 2023-11-13 (×2): 4 mg via INTRAVENOUS
  Administered 2023-11-13 (×2): 2 mg via INTRAVENOUS
  Administered 2023-11-13: 4 mg via INTRAVENOUS
  Administered 2023-11-13: 3 mg via INTRAVENOUS
  Administered 2023-11-13: 4 mg via INTRAVENOUS
  Administered 2023-11-14: 3 mg via INTRAVENOUS

## 2023-11-13 MED ORDER — MORPHINE 100MG IN NS 100ML (1MG/ML) PREMIX INFUSION
2.0000 mg/h | INTRAVENOUS | Status: DC
  Administered 2023-11-13: 3 mg/h via INTRAVENOUS
  Administered 2023-11-13: 4 mg/h via INTRAVENOUS
  Administered 2023-11-13: 5 mg/h via INTRAVENOUS
  Administered 2023-11-13: 8 mg/h via INTRAVENOUS
  Administered 2023-11-13: 6 mg/h via INTRAVENOUS
  Administered 2023-11-13: 2 mg/h via INTRAVENOUS
  Administered 2023-11-14: 8 mg/h via INTRAVENOUS
  Filled 2023-11-13 (×3): qty 100

## 2023-11-13 MED ORDER — POLYVINYL ALCOHOL 1.4 % OP SOLN: 1.0000 [drp] | Freq: Four times a day (QID) | OPHTHALMIC | Status: DC | PRN

## 2023-11-13 MED ORDER — BIOTENE DRY MOUTH MT LIQD: 15.0000 mL | OROMUCOSAL | Status: DC | PRN

## 2023-11-13 MED ORDER — ONDANSETRON HCL 4 MG/2ML IJ SOLN: 4.0000 mg | Freq: Four times a day (QID) | INTRAMUSCULAR | Status: DC | PRN

## 2023-11-13 MED ORDER — ONDANSETRON 4 MG PO TBDP: 4.0000 mg | ORAL_TABLET | Freq: Four times a day (QID) | ORAL | Status: DC | PRN

## 2023-11-13 NOTE — Progress Notes (Signed)
 PROGRESS NOTE        PATIENT DETAILS Name: Stephen Meza Age: 89 y.o. Sex: male Date of Birth: 02-08-1934 Admit Date: 11/08/2023 Admitting Physician Editha Ram, MD ERE:Fjuuyztd, [name], FNP  Brief Summary: Patient is a 89 y.o.  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  Significant events: 9/15>> admit to [facility]  Significant studies: 915>> CT angio chest: Multilobar large areas of ground glass opacities-no large filling defect. 9/16>> echo: EF 55-60% 9/17>> B/L LE Doppler:+ DVT in RLE  Significant microbiology data: 9/15>> blood culture: No growth 9/16>> COVID/influenza/RSV PCR: Negative 9/16>> respiratory virus panel:+ve rhinovirus  Procedures: None  Consults: PCCM Palliative care  Subjective: No major issues overnight-appears unchanged-still on heated high flow.  Awaiting arrival of family members before transitioning to full comfort measures.  Objective: Vitals: Blood pressure (!) 160/102, pulse 85, temperature 97.8 F (36.6 C), temperature source Oral, resp. rate 20, height 6' (1.829 m), weight 69 kg, SpO2 94%.   Exam: Gen Exam:Alert awake-not in any distress HEENT:atraumatic, normocephalic Chest: Few bibasilar rales. CVS:S1S2 regular Abdomen:soft non tender, non distended Extremities:no edema Neurology: Non focal Skin: no rash  Pertinent Labs/Radiology:    Latest Ref Rng & Units 11/11/2023    3:20 AM 11/10/2023    3:46 AM 11/09/2023    5:13 PM  CBC  WBC 4.0 - 10.5 K/uL 6.8  11.2  9.9   Hemoglobin 13.0 - 17.0 g/dL 8.3  8.3  89.8   Hematocrit 39.0 - 52.0 % 26.4  26.0  32.6    Platelets 150 - 400 K/uL 200  216  249     Lab Results  Component Value Date   NA 135 11/11/2023   K 3.8 11/11/2023   CL 100 11/11/2023   CO2 22 11/11/2023      Assessment/Plan: Acute hypoxic respiratory failure with ARDS secondary to rhinovirus pneumonia +/- small pulm embolism not seen on CTA chest (has RLE DVT on Doppler) Continue to have severe hypoxemia-on 100% FiO2 via HFNC at 40 L/min and 100% FiO2 through NRB Continue Solu-Medrol /bronchodilators After extensive discussion with family-family is planning to transition to comfort measures later today-they are awaiting arrival of family members.  Palliative care following.  Expect inpatient death-he is not a candidate for transfer to residential hospice as he has severe hypoxemia requiring heated high flow-as he is not stable for transport.   RLE DVT Eliquis   Syncope Possibly orthostatic/vasovagal mechanism in the setting of infection/coughing/dehydration-but with DVT-small transient PE also possible. 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 Jenene are to transition to comfort measures later today.  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  HTN BP stable-but on midodrine  All antihypertensives held.  Minimally elevated troponin-likely type II non-STEMI/demand ischemia Supportive care Echo stable Doubt further workup required  Gout Not in flare Continue allopurinol   GERD PPI  Advanced directive/palliative care DNR in place  This MD and palliative care team have had extensive discussion with family-he has had significant decline in quality of life after the stroke that he had earlier this year-he remains with severe hypoxemia-on  heated high flow-it is felt that even if he survives this hospitalization-he will continue to have significant decline in his functional status.  No improvement in the past several days-continues to have severe hypoxemia.  After extensive discussion with both patient/family-plan is to transition to full comfort measures later today once family members arrive.  Expect inpatient death-see above.  Code status:   Code Status: Do not attempt resuscitation (DNR) - Comfort care   DVT Prophylaxis: SCDs Start: 11/09/23 0048 apixaban  (ELIQUIS ) tablet 10 mg  apixaban  (ELIQUIS ) tablet 5 mg    Family Communication: Spouse/2 daughters at bedside   Disposition Plan: Status is: Inpatient Remains inpatient appropriate because: Severe illness   Planned Discharge Destination: TBD remained   Diet: Diet Order             DIET DYS 2 Room service appropriate? No; Fluid consistency: Thin  Diet effective now                     Antimicrobial agents: Anti-infectives (From admission, onward)    Start     Dose/Rate Route Frequency Ordered Stop   11/10/23 1000  azithromycin  (ZITHROMAX ) tablet 500 mg        500 mg Oral Daily 11/09/23 0827 11/11/23 0913   11/09/23 2200  cefTRIAXone  (ROCEPHIN ) 1 g in sodium chloride  0.9 % 100 mL IVPB  Status:  Discontinued        1 g 200 mL/hr over 30 Minutes Intravenous Every 24 hours 11/09/23 0125 11/09/23 0959   11/09/23 0200  azithromycin  (ZITHROMAX ) 500 mg in sodium chloride  0.9 % 250 mL IVPB  Status:  Discontinued        500 mg 250 mL/hr over 60 Minutes Intravenous Every 24 hours 11/09/23 0125 11/09/23 0827   11/09/23 0130  vancomycin  (VANCOREADY) IVPB 1500 mg/300 mL        1,500 mg 150 mL/hr over 120 Minutes Intravenous  Once 11/09/23 0125 11/09/23 0543   11/09/23 0130  cefTRIAXone  (ROCEPHIN ) 1 g in sodium chloride  0.9 % 100 mL IVPB        1 g 200 mL/hr over 30 Minutes Intravenous  Once 11/09/23 0127 11/09/23 0322        MEDICATIONS: Scheduled  Meds:  acetylcysteine   3 mL Nebulization TID   allopurinol   100 mg Oral Daily   apixaban   10 mg Oral BID   Followed by   NOREEN ON 11/18/2023] apixaban   5 mg Oral BID   aspirin  EC  81 mg Oral Daily   ezetimibe   10 mg Oral Daily   ipratropium  0.5 mg Nebulization Q6H   levalbuterol   0.63 mg Nebulization Q6H   methylPREDNISolone  (SOLU-MEDROL ) injection  100 mg Intravenous Q12H   midodrine   5 mg Oral TID WC   pantoprazole   40 mg Oral Daily   Continuous Infusions:   PRN Meds:.acetaminophen  **OR** acetaminophen , benzonatate , glycopyrrolate , LORazepam , melatonin, metoprolol  tartrate, morphine  injection, mouth rinse   I have personally reviewed following labs and imaging studies  LABORATORY DATA: CBC: Recent Labs  Lab 11/08/23 1716 11/09/23 1713 11/10/23 0346 11/11/23 0320  WBC 9.0 9.9 11.2* 6.8  NEUTROABS 8.0*  --   --   --   HGB 8.2* 10.1* 8.3* 8.3*  HCT 27.0* 32.6* 26.0* 26.4*  MCV 96.1 93.4 92.2 93.0  PLT 213 249 216 200    Basic Metabolic Panel: Recent Labs  Lab 11/08/23 1716 11/09/23 0413 11/09/23 1713 11/10/23 0346 11/11/23 0320  NA 126* 131* 132* 131* 135  K 3.8 3.2* 4.4 3.6 3.8  CL 95* 101 100 102 100  CO2 19* 19* 22 19* 22  GLUCOSE 141* 117* 145* 116* 123*  BUN 29* 19 20 27* 23  CREATININE 0.79 0.69 0.73 0.77 0.68  CALCIUM  8.0* 7.9* 8.7* 8.3* 8.5*  MG  --   --  2.1 1.9  --   PHOS  --   --  3.7 3.9  3.8  --     GFR: Estimated Creatinine Clearance: 62.3 mL/min (by C-G formula based on SCr of 0.68 mg/dL).  Liver Function Tests: Recent Labs  Lab 11/08/23 1716 11/10/23 0346 11/11/23 0320  AST 36  --  45*  ALT 30  --  42  ALKPHOS 81  --  82  BILITOT 0.5  --  0.6  PROT 5.9*  --  5.7*  ALBUMIN  2.3* 2.4* 2.5*   No results for input(s): LIPASE, AMYLASE in the last 168 hours. No results for input(s): AMMONIA in the last 168 hours.  Coagulation Profile: Recent Labs  Lab 11/08/23 1716  INR 1.3*    Cardiac Enzymes: No results for  input(s): CKTOTAL, CKMB, CKMBINDEX, TROPONINI in the last 168 hours.  BNP (last 3 results) No results for input(s): PROBNP in the last 8760 hours.  Lipid Profile: No results for input(s): CHOL, HDL, LDLCALC, TRIG, CHOLHDL, LDLDIRECT in the last 72 hours.  Thyroid  Function Tests: No results for input(s): TSH, T4TOTAL, FREET4, T3FREE, THYROIDAB in the last 72 hours.  Anemia Panel: No results for input(s): VITAMINB12, FOLATE, FERRITIN, TIBC, IRON , RETICCTPCT in the last 72 hours.  Urine analysis:    Component Value Date/Time   COLORURINE YELLOW 11/08/2023 2319   APPEARANCEUR CLEAR 11/08/2023 2319   LABSPEC 1.023 11/08/2023 2319   PHURINE 6.0 11/08/2023 2319   GLUCOSEU NEGATIVE 11/08/2023 2319   GLUCOSEU NEGATIVE 05/21/2023 1448   HGBUR NEGATIVE 11/08/2023 2319   BILIRUBINUR NEGATIVE 11/08/2023 2319   KETONESUR NEGATIVE 11/08/2023 2319   PROTEINUR NEGATIVE 11/08/2023 2319   UROBILINOGEN 1.0 05/21/2023 1448   NITRITE NEGATIVE 11/08/2023 2319   LEUKOCYTESUR NEGATIVE 11/08/2023 2319    Sepsis Labs: Lactic Acid, Venous    Component Value Date/Time   LATICACIDVEN 1.2 11/08/2023 2331    MICROBIOLOGY: Recent Results (from the past 240 hours)  Culture, blood (Routine x 2)     Status: None   Collection Time: 11/08/23  5:15 PM   Specimen: BLOOD  Result Value Ref Range Status   Specimen Description BLOOD SITE NOT SPECIFIED  Final   Special Requests   Final    BOTTLES DRAWN AEROBIC AND ANAEROBIC Blood Culture results may not be optimal due to an inadequate volume of blood received in culture bottles   Culture   Final    NO GROWTH 5 DAYS Performed at [facility], [address], [place], [place] [address]    Report Status 11/13/2023 FINAL  Final  Culture, blood (Routine x 2)     Status: None   Collection Time: 11/08/23  5:28 PM   Specimen: BLOOD  Result Value Ref Range Status   Specimen Description BLOOD SITE NOT SPECIFIED   Final   Special Requests   Final    BOTTLES DRAWN AEROBIC AND ANAEROBIC Blood Culture results may not be optimal due to an inadequate volume of blood  received in culture bottles   Culture   Final    NO GROWTH 5 DAYS Performed at [facility], [address], [place], [place] [address]    Report Status 11/13/2023 FINAL  Final  Resp panel by RT-PCR (RSV, Flu A&B, Covid) Anterior Nasal Swab     Status: None   Collection Time: 11/09/23  3:29 AM   Specimen: Anterior Nasal Swab  Result Value Ref Range Status   SARS Coronavirus 2 by RT PCR NEGATIVE NEGATIVE Final   Influenza A by PCR NEGATIVE NEGATIVE Final   Influenza B by PCR NEGATIVE NEGATIVE Final    Comment: (NOTE) The Xpert Xpress SARS-CoV-2/FLU/RSV plus assay is intended as an aid in the diagnosis of influenza from Nasopharyngeal swab specimens and should not be used as a sole basis for treatment. Nasal washings and aspirates are unacceptable for Xpert Xpress SARS-CoV-2/FLU/RSV testing.  Fact Sheet for Patients: BloggerCourse.com  Fact Sheet for Healthcare Providers: SeriousBroker.it  This test is not yet approved or cleared by the [place]  FDA and has been authorized for detection and/or diagnosis of SARS-CoV-2 by FDA under an Emergency Use Authorization (EUA). This EUA will remain in effect (meaning this test can be used) for the duration of the COVID-19 declaration under Section 564(b)(1) of the Act, 21 U.S.C. section 360bbb-3(b)(1), unless the authorization is terminated or revoked.     Resp Syncytial Virus by PCR NEGATIVE NEGATIVE Final    Comment: (NOTE) Fact Sheet for Patients: BloggerCourse.com  Fact Sheet for Healthcare Providers: SeriousBroker.it  This test is not yet approved or cleared by the [place]  FDA and has been authorized for detection and/or diagnosis of SARS-CoV-2 by FDA under an  Emergency Use Authorization (EUA). This EUA will remain in effect (meaning this test can be used) for the duration of the COVID-19 declaration under Section 564(b)(1) of the Act, 21 U.S.C. section 360bbb-3(b)(1), unless the authorization is terminated or revoked.  Performed at [facility], [address], [place], [place] [address]   Respiratory (~20 pathogens) panel by PCR     Status: Abnormal   Collection Time: 11/09/23  3:29 AM   Specimen: Nasopharyngeal Swab; Respiratory  Result Value Ref Range Status   Adenovirus NOT DETECTED NOT DETECTED Final   Coronavirus 229E NOT DETECTED NOT DETECTED Final    Comment: (NOTE) The Coronavirus on the Respiratory Panel, DOES NOT test for the novel  Coronavirus (2019 nCoV)    Coronavirus HKU1 NOT DETECTED NOT DETECTED Final   Coronavirus NL63 NOT DETECTED NOT DETECTED Final   Coronavirus OC43 NOT DETECTED NOT DETECTED Final   Metapneumovirus NOT DETECTED NOT DETECTED Final   Rhinovirus / Enterovirus DETECTED (A) NOT DETECTED Final   Influenza A NOT DETECTED NOT DETECTED Final   Influenza B NOT DETECTED NOT DETECTED Final   Parainfluenza Virus 1 NOT DETECTED NOT DETECTED Final   Parainfluenza Virus 2 NOT DETECTED NOT DETECTED Final   Parainfluenza Virus 3 NOT DETECTED NOT DETECTED Final   Parainfluenza Virus 4 NOT DETECTED NOT DETECTED Final   Respiratory Syncytial Virus NOT DETECTED NOT DETECTED Final   Bordetella pertussis NOT DETECTED NOT DETECTED Final   Bordetella Parapertussis NOT DETECTED NOT DETECTED Final   Chlamydophila pneumoniae NOT DETECTED NOT DETECTED Final   Mycoplasma pneumoniae NOT DETECTED NOT DETECTED Final    Comment: Performed at [facility], [address] [place], [place] [address]  MRSA Next Gen by PCR, Nasal     Status: None  Collection Time: 11/09/23  4:57 AM  Result Value Ref Range Status   MRSA by PCR Next Gen NOT DETECTED NOT DETECTED Final    Comment: (NOTE) The GeneXpert MRSA Assay (FDA  approved for NASAL specimens only), is one component of a comprehensive MRSA colonization surveillance program. It is not intended to diagnose MRSA infection nor to guide or monitor treatment for MRSA infections. Test performance is not FDA approved in patients less than 82 years old. Performed at [facility], [address] [place], [place] [address]     RADIOLOGY STUDIES/RESULTS: No results found.    LOS: 5 days   Donalda Applebaum, MD  [facility]    To contact the attending provider between 7A-7P or the covering provider during after hours 7P-7A, please log into the web site [email or web address] and access using universal  password for that web site. If you do not have the password, please call the hospital operator.  11/13/2023, 10:10 AM

## 2023-11-13 NOTE — Progress Notes (Signed)
 Daily Progress Note   Patient Name: Stephen Meza       Date: 11/13/2023 DOB: [birth year]  Age: 89 y.o. MRN#: [id number] Attending Physician: Stephen Donalda HERO, MD Primary Care Physician: Stephen [name], FNP Admit Date: 11/08/2023  Reason for Consultation/Follow-up: Establishing goals of care  Subjective: Medical records reviewed including progress notes, labs, imaging. Patient assessed at the bedside.  He has several family members visiting including wife, 2 daughters, 2 sons in Social worker.  His symptoms usually worse at night and he is ready for end-of-life.  At family's request, reviewed options for comfort measures and provided detailed review of the transition process.  Recommended morphine  drip, anticipating air hunger as oxygen is weaned.  Patient would like to wean off oxygen as soon as possible.  He does not want to linger.    Reviewed that patient would no longer receive aggressive medical interventions such as continuous vital signs, lab work, radiology testing, or medications not focused on comfort. All care would focus on how the patient is looking and feeling. This would include management of any symptoms that may cause discomfort, pain, shortness of breath, cough, nausea, agitation, anxiety, and/or secretions etc. Symptoms would be managed with medications and other non-pharmacological interventions such as spiritual support if requested, repositioning, music therapy, or therapeutic listening. Family verbalized understanding and appreciation.   Questions and concerns addressed. PMT will continue to support holistically.   Length of Stay: 5   Physical Exam Vitals and nursing note reviewed.  Constitutional:      General: He is not in acute distress.    Appearance: He is ill-appearing.  HENT:     Head: Normocephalic and  atraumatic.  Cardiovascular:     Rate and Rhythm: Normal rate.  Pulmonary:     Effort: Tachypnea present.  Skin:    General: Skin is warm and dry.  Neurological:     Mental Status: He is alert and oriented to person, place, and time.  Psychiatric:        Behavior: Behavior normal.            Vital Signs: BP (!) 160/102 (BP Location: Right Arm)   Pulse 85   Temp 97.8 F (36.6 C) (Oral)   Resp 20   Ht 6' (1.829 m)   Wt 69 kg   SpO2 94%   BMI 20.63 kg/m  SpO2: SpO2: 94 % O2 Device: O2 Device: Heated High Flow Nasal Cannula O2 Flow Rate: O2 Flow Rate (L/min): 45 L/min   Palliative Care Assessment & Plan   Patient Profile: 89 y.o. male  with past medical history of  Afib, CVA (04/2023), lung cancer s/p CRT, hx of prostate and bladder cancer admitted on 11/08/2023 with acute hypoxic respiratory failure 2/2 rhinovirus pneumonia. Patient presented to ED with shortness of breath and syncope in wheelchair without fall after completing CRT 1 month ago for lung cancer. During hospital stay the patient has increased oxygen requirement and now on heated high flow [place] at 40 L/min and 80% FiO2. Palliative consulted for discussion of goals of care.   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 No escalation.  Discussed with RN Psychosocial and emotional support provided PMT will continue to follow and support   Prognosis:  Hours - Days  Discharge  Planning: Anticipated Hospital Death  Care plan was discussed with patient, patient's family, RN, MD   Stephen SHAUNNA Fell, PA-C  Palliative Medicine Team Team phone # [phone]  Thank you for allowing the Palliative Medicine Team to assist in the care of this patient. Please utilize secure chat with additional questions, if there is no response within 30 minutes please call the above phone number.  Palliative Medicine Team providers are available by phone from 7am to 7pm daily and can be reached through the team cell phone.  Should this patient require assistance outside of these hours, please call the patient's attending physician.   Billing based on MDM: High  Problems Addressed: One acute or chronic illness or injury that poses a threat to life or bodily function  Amount and/or Complexity of Data: Category 1:Review of prior external note(s) from each unique source and Review of the result(s) of each unique test, Category 2:Independent interpretation of a test performed by another physician/other qualified health care professional (not separately reported), and Category 3:Discussion of management or test interpretation with external physician/other qualified health care professional/appropriate source (not separately reported)  Risks: Decision not to resuscitate or to de-escalate care because of poor prognosis

## 2023-11-13 NOTE — Plan of Care (Signed)
  Problem: Pain Managment: Goal: General experience of comfort will improve and/or be controlled Outcome: Progressing

## 2023-11-13 NOTE — Plan of Care (Signed)
   Problem: Health Behavior/Discharge Planning: Goal: Ability to manage health-related needs will improve Outcome: Progressing   Problem: Activity: Goal: Risk for activity intolerance will decrease Outcome: Progressing   Problem: Nutrition: Goal: Adequate nutrition will be maintained Outcome: Progressing   Problem: Coping: Goal: Level of anxiety will decrease Outcome: Progressing

## 2023-11-14 DIAGNOSIS — Z7189 Other specified counseling: Secondary | ICD-10-CM | POA: Diagnosis not present

## 2023-11-14 DIAGNOSIS — J9601 Acute respiratory failure with hypoxia: Secondary | ICD-10-CM | POA: Diagnosis not present

## 2023-11-14 DIAGNOSIS — Z515 Encounter for palliative care: Secondary | ICD-10-CM | POA: Diagnosis not present

## 2023-11-14 LAB — FUNGITELL BETA-D-GLUCAN

## 2023-11-15 ENCOUNTER — Inpatient Hospital Stay

## 2023-11-15 ENCOUNTER — Inpatient Hospital Stay: Admitting: Internal Medicine

## 2023-11-24 NOTE — Death Summary Note (Addendum)
 [facility] Death Note                                                                                                                                                                                               Stephen Meza, is a 89 y.o. male, DOB - May 05, 1933, FMW:[id number]  Admit date - Nov 21, 2023   Admitting Physician Stephen Ram, MD  Outpatient Primary MD for the patient is Stephen Stephen CROME, FNP  LOS - 6  Chief Complaint  Patient presents with   Shortness of Breath       Notification: Stephen Stephen CROME, FNP notified of death of 11-27-23   Admit Date:  2023-11-21  Date of Death:  27-Nov-2023  Time of Death:  11.30 am  Length of Stay: 6   Pronounced by - RN  History of present illness:   Stephen Meza is a 89 y.o. 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  Despite appropriate care he did not show signs of improvement and was transition to full comfort care under the guidance of palliative care on 11/26/23. Passed away in comfort on 2023/11/27.   Final Diagnoses:  Cause of death -  Pneumonia  Signature  -    Lavada Stank M.D on 2023/11/27 at 12:11 PM   -  To page go to [email or web address]   Total clinical and documentation time for today Under 30 minutes   Last Note                           PROGRESS NOTE        PATIENT DETAILS Name: Stephen Meza Age: 67 y.o. Sex: male Date of Birth: 05/06/1933 Admit Date: November 21, 2023 Admitting Physician Stephen Ram, MD ERE:Fjuuyztd, Stephen CROME, FNP  Brief  Summary: Patient is a 89 y.o.  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  Despite appropriate care he did not show signs of improvement and was transition to full comfort care under the guidance of palliative care on  11/13/2023.  Significant events: 9/15>> admit to [facility]  Significant studies: 915>> CT angio chest: Multilobar large areas of ground glass opacities-no large filling defect. 9/16>> echo: EF 55-60% 9/17>> B/L LE Doppler:+ DVT in RLE  Significant microbiology data: 9/15>> blood culture: No growth 9/16>> COVID/influenza/RSV PCR: Negative 9/16>> respiratory virus panel:+ve rhinovirus  Procedures: None  Consults: PCCM Palliative care  Subjective: Patient in bed, nonverbal at this point.  Objective: Vitals: Blood pressure (!) 81/51, pulse (!) 163, temperature 98.4 F (36.9 C), temperature source Axillary, resp. rate 20, height 6' (1.829 m), weight 69 kg, SpO2 (!) 71%.   Exam:  Patient in bed appears to be in no distress, resting comfortably, family bedside.  Assessment/Plan:   Patient is now full comfort care, palliative care on board, there is expected soon, I will not on comfort medications stopped.     Other medical issues addressed during the hospital stay earlier are below    acute hypoxic respiratory failure with ARDS secondary to rhinovirus pneumonia +/- small pulm embolism not seen on CTA chest (has RLE DVT on Doppler) Continue to have severe hypoxemia-on 100% FiO2 via HFNC at 40 L/min and 100% FiO2 through NRB Continue Solu-Medrol /bronchodilators After extensive discussion with family-family is planning to transition to  comfort measures later today-they are awaiting arrival of family members.  Palliative care following.  Expect inpatient death-he is not a candidate for transfer to residential hospice as he has severe hypoxemia requiring heated high flow-as he is not stable for transport.   RLE DVT Eliquis   Syncope Possibly orthostatic/vasovagal mechanism in the setting of infection/coughing/dehydration-but with DVT-small transient PE also possible. 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 Jenene are to transition to comfort measures later today.  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  HTN BP stable-but on midodrine  All antihypertensives held.  Minimally elevated troponin-likely type II non-STEMI/demand ischemia Supportive care Echo stable Doubt further workup required  Gout Not in flare Continue allopurinol   GERD PPI  Advanced directive/palliative care DNR in place This MD and palliative care team have had extensive discussion with family-he has had significant decline in quality of life after the stroke that he had earlier this year-he remains with severe hypoxemia-on heated high flow-it is felt that even if he survives this hospitalization-he will continue to have significant decline in his functional status.  Now transition to full comfort care.  Expected to pass away in the next day or 2.  Code status:   Code Status: Do not attempt resuscitation (DNR) - Comfort care   DVT Prophylaxis:     Family Communication: Spouse/2 daughters at bedside   Disposition Plan: Status is: Inpatient Remains inpatient appropriate because: Severe illness   Planned Discharge Destination: TBD  remained   Diet: Diet Order             DIET DYS 2 Room service appropriate? No; Fluid consistency: Thin  Diet effective now                       MEDICATIONS: Scheduled Meds:  ipratropium  0.5 mg Nebulization Q6H   levalbuterol   0.63 mg Nebulization Q6H   Continuous Infusions:  morphine  Stopped (2023-05-16 1132)    PRN Meds:.acetaminophen  **OR** acetaminophen , antiseptic oral rinse, artificial tears, benzonatate , glycopyrrolate , LORazepam , melatonin,  morphine , ondansetron  **OR** ondansetron  (ZOFRAN ) IV, mouth rinse   I have personally reviewed following labs and imaging studies  LABORATORY DATA: CBC: Recent Labs  Lab 11/08/23 1716 11/09/23 1713 11/10/23 0346 11/11/23 0320  WBC 9.0 9.9 11.2* 6.8  NEUTROABS 8.0*  --   --   --   HGB 8.2* 10.1* 8.3* 8.3*  HCT 27.0* 32.6* 26.0* 26.4*  MCV 96.1 93.4 92.2 93.0  PLT 213 249 216 200    Basic Metabolic Panel: Recent Labs  Lab 11/08/23 1716 11/09/23 0413 11/09/23 1713 11/10/23 0346 11/11/23 0320  NA 126* 131* 132* 131* 135  K 3.8 3.2* 4.4 3.6 3.8  CL 95* 101 100 102 100  CO2 19* 19* 22 19* 22  GLUCOSE 141* 117* 145* 116* 123*  BUN 29* 19 20 27* 23  CREATININE 0.79 0.69 0.73 0.77 0.68  CALCIUM  8.0* 7.9* 8.7* 8.3* 8.5*  MG  --   --  2.1 1.9  --   PHOS  --   --  3.7 3.9  3.8  --     GFR: Estimated Creatinine Clearance: 62.3 mL/min (by C-G formula based on SCr of 0.68 mg/dL).  Liver Function Tests: Recent Labs  Lab 11/08/23 1716 11/10/23 0346 11/11/23 0320  AST 36  --  45*  ALT 30  --  42  ALKPHOS 81  --  82  BILITOT 0.5  --  0.6  PROT 5.9*  --  5.7*  ALBUMIN  2.3* 2.4* 2.5*   No results for input(s): LIPASE, AMYLASE in the last 168 hours. No results for input(s): AMMONIA in the last 168 hours.  Coagulation Profile: Recent Labs  Lab 11/08/23 1716  INR 1.3*    Cardiac Enzymes: No results for input(s): CKTOTAL, CKMB, CKMBINDEX, TROPONINI in the last 168 hours.  BNP (last 3  results) No results for input(s): PROBNP in the last 8760 hours.  Lipid Profile: No results for input(s): CHOL, HDL, LDLCALC, TRIG, CHOLHDL, LDLDIRECT in the last 72 hours.  Thyroid  Function Tests: No results for input(s): TSH, T4TOTAL, FREET4, T3FREE, THYROIDAB in the last 72 hours.  Anemia Panel: No results for input(s): VITAMINB12, FOLATE, FERRITIN, TIBC, IRON , RETICCTPCT in the last 72 hours.  Urine analysis:    Component Value Date/Time   COLORURINE YELLOW 11/08/2023 2319   APPEARANCEUR CLEAR 11/08/2023 2319   LABSPEC 1.023 11/08/2023 2319   PHURINE 6.0 11/08/2023 2319   GLUCOSEU NEGATIVE 11/08/2023 2319   GLUCOSEU NEGATIVE 05/21/2023 1448   HGBUR NEGATIVE 11/08/2023 2319   BILIRUBINUR NEGATIVE 11/08/2023 2319   KETONESUR NEGATIVE 11/08/2023 2319   PROTEINUR NEGATIVE 11/08/2023 2319   UROBILINOGEN 1.0 05/21/2023 1448   NITRITE NEGATIVE 11/08/2023 2319   LEUKOCYTESUR NEGATIVE 11/08/2023 2319    Sepsis Labs: Lactic Acid, Venous    Component Value Date/Time   LATICACIDVEN 1.2 11/08/2023 2331    MICROBIOLOGY: Recent Results (from the past 240 hours)  Culture, blood (Routine x 2)     Status: None   Collection Time: 11/08/23  5:15 PM   Specimen: BLOOD  Result Value Ref Range Status   Specimen Description BLOOD SITE NOT SPECIFIED  Final   Special Requests   Final    BOTTLES DRAWN AEROBIC AND ANAEROBIC Blood Culture results may not be optimal due to an inadequate volume of blood received in culture bottles   Culture   Final    NO GROWTH 5 DAYS Performed at [facility], [address] [place], [place] [address]    Report Status 11/13/2023 FINAL  Final  Culture, blood (Routine x 2)     Status: None   Collection Time: 11/08/23  5:28 PM   Specimen: BLOOD  Result Value Ref Range Status   Specimen Description BLOOD SITE NOT SPECIFIED  Final   Special Requests   Final    BOTTLES DRAWN AEROBIC AND ANAEROBIC Blood Culture  results may not be optimal due to an inadequate volume of blood received in culture bottles   Culture   Final    NO GROWTH 5 DAYS Performed at [facility], [address], [place], [place] [address]    Report Status 11/13/2023 FINAL  Final  Resp panel by RT-PCR (RSV, Flu A&B, Covid) Anterior Nasal Swab     Status: None   Collection Time: 11/09/23  3:29 AM   Specimen: Anterior Nasal Swab  Result Value Ref Range Status   SARS Coronavirus 2 by RT PCR NEGATIVE NEGATIVE Final   Influenza A by PCR NEGATIVE NEGATIVE Final   Influenza B by PCR NEGATIVE NEGATIVE Final    Comment: (NOTE) The Xpert Xpress SARS-CoV-2/FLU/RSV plus assay is intended as an aid in the diagnosis of influenza from Nasopharyngeal swab specimens and should not be used as a sole basis for treatment. Nasal washings and aspirates are unacceptable for Xpert Xpress SARS-CoV-2/FLU/RSV testing.  Fact Sheet for Patients: BloggerCourse.com  Fact Sheet for Healthcare Providers: SeriousBroker.it  This test is not yet approved or cleared by the [place]  FDA and has been authorized for detection and/or diagnosis of SARS-CoV-2 by FDA under an Emergency Use Authorization (EUA). This EUA will remain in effect (meaning this test can be used) for the duration of the COVID-19 declaration under Section 564(b)(1) of the Act, 21 U.S.C. section 360bbb-3(b)(1), unless the authorization is terminated or revoked.     Resp Syncytial Virus by PCR NEGATIVE NEGATIVE Final    Comment: (NOTE) Fact Sheet for Patients: BloggerCourse.com  Fact Sheet for Healthcare Providers: SeriousBroker.it  This test is not yet approved or cleared by the [place]  FDA and has been authorized for detection and/or diagnosis of SARS-CoV-2 by FDA under an Emergency Use Authorization (EUA). This EUA will remain in effect (meaning this test can be  used) for the duration of the COVID-19 declaration under Section 564(b)(1) of the Act, 21 U.S.C. section 360bbb-3(b)(1), unless the authorization is terminated or revoked.  Performed at [facility], [address], [place], [place] [address]   Respiratory (~20 pathogens) panel by PCR     Status: Abnormal   Collection Time: 11/09/23  3:29 AM   Specimen: Nasopharyngeal Swab; Respiratory  Result Value Ref Range Status   Adenovirus NOT DETECTED NOT DETECTED Final   Coronavirus 229E NOT DETECTED NOT DETECTED Final    Comment: (NOTE) The Coronavirus on the Respiratory Panel, DOES NOT test for the novel  Coronavirus (2019 nCoV)    Coronavirus HKU1 NOT DETECTED NOT DETECTED Final   Coronavirus NL63 NOT DETECTED NOT DETECTED Final   Coronavirus OC43 NOT DETECTED NOT DETECTED Final   Metapneumovirus NOT DETECTED NOT DETECTED Final   Rhinovirus / Enterovirus DETECTED (A) NOT DETECTED Final   Influenza A NOT DETECTED NOT DETECTED Final   Influenza B NOT DETECTED NOT DETECTED Final   Parainfluenza Virus 1 NOT DETECTED NOT DETECTED Final   Parainfluenza Virus 2 NOT DETECTED NOT DETECTED Final   Parainfluenza Virus 3 NOT DETECTED NOT DETECTED Final   Parainfluenza Virus 4 NOT DETECTED NOT DETECTED Final   Respiratory Syncytial Virus NOT DETECTED  NOT DETECTED Final   Bordetella pertussis NOT DETECTED NOT DETECTED Final   Bordetella Parapertussis NOT DETECTED NOT DETECTED Final   Chlamydophila pneumoniae NOT DETECTED NOT DETECTED Final   Mycoplasma pneumoniae NOT DETECTED NOT DETECTED Final    Comment: Performed at [facility], [address] [place], [place] [address]  MRSA Next Gen by PCR, Nasal     Status: None   Collection Time: 11/09/23  4:57 AM  Result Value Ref Range Status   MRSA by PCR Next Gen NOT DETECTED NOT DETECTED Final    Comment: (NOTE) The GeneXpert MRSA Assay (FDA approved for NASAL specimens only), is one component of a comprehensive MRSA colonization  surveillance program. It is not intended to diagnose MRSA infection nor to guide or monitor treatment for MRSA infections. Test performance is not FDA approved in patients less than 18 years old. Performed at [facility], [address], [place], [place] [address]     RADIOLOGY STUDIES/RESULTS: No results found.    LOS: 6 days   Lavada Stank, MD  [facility]    To contact the attending provider between 7A-7P or the covering provider during after hours 7P-7A, please log into the web site [email or web address] and access using universal [place] password for that web site. If you do not have the password, please call the hospital operator.  12-11-23, 12:11 PM

## 2023-11-24 NOTE — Progress Notes (Addendum)
 PROGRESS NOTE        PATIENT DETAILS Name: Stephen Meza Age: 89 y.o. Sex: male Date of Birth: 12-12-1933 Admit Date: 11/08/2023 Admitting Physician Editha Ram, MD ERE:Fjuuyztd, [name], FNP  Brief Summary: Patient is a 89 y.o.  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  Despite appropriate care he did not show signs of improvement and was transition to full comfort care under the guidance of palliative care on 11/13/2023.  Significant events: 9/15>> admit to [facility]  Significant studies: 915>> CT angio chest: Multilobar large areas of ground glass opacities-no large filling defect. 9/16>> echo: EF 55-60% 9/17>> B/L LE Doppler:+ DVT in RLE  Significant microbiology data: 9/15>> blood culture: No growth 9/16>> COVID/influenza/RSV PCR: Negative 9/16>> respiratory virus panel:+ve rhinovirus  Procedures: None  Consults: PCCM Palliative care  Subjective: Patient in bed, nonverbal at this point.  Objective: Vitals: Blood pressure (!) 81/51, pulse (!) 163, temperature 98.4 F (36.9 C), temperature source Axillary, resp. rate 20, height 6' (1.829 m), weight 69 kg, SpO2 (!) 71%.   Exam:  Patient in bed appears to be in no distress, resting comfortably, family bedside.  Assessment/Plan:   Patient is now full comfort care, palliative care on board, there is expected soon, I will not on comfort medications stopped.     Other medical issues addressed during the hospital stay earlier are below    acute hypoxic respiratory failure with ARDS secondary to rhinovirus pneumonia +/- small pulm embolism not  seen on CTA chest (has RLE DVT on Doppler) Continue to have severe hypoxemia-on 100% FiO2 via HFNC at 40 L/min and 100% FiO2 through NRB Continue Solu-Medrol /bronchodilators After extensive discussion with family-family is planning to transition to comfort measures later today-they are awaiting arrival of family members.  Palliative care following.  Expect inpatient death-he is not a candidate for transfer to residential hospice as he has severe hypoxemia requiring heated high flow-as he is not stable for transport.   RLE DVT Eliquis   Syncope Possibly orthostatic/vasovagal mechanism in the setting of infection/coughing/dehydration-but with DVT-small transient PE also possible. 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 Jenene are to transition to comfort measures later today.  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  HTN BP stable-but on midodrine  All antihypertensives held.  Minimally elevated troponin-likely type II non-STEMI/demand ischemia Supportive care Echo stable Doubt further workup required  Gout Not in flare Continue allopurinol   GERD PPI  Advanced directive/palliative care DNR in place This MD and palliative care team have had extensive discussion with family-he has had significant decline in quality of life after the stroke that he had earlier this year-he remains with severe hypoxemia-on heated high flow-it is felt that even if he survives this hospitalization-he will continue to have significant decline in his functional status.  Now transition to full comfort care.  Expected to pass away in the next  day or 2.  Code status:   Code Status: Do not attempt resuscitation (DNR) -  Comfort care   DVT Prophylaxis:     Family Communication: Spouse/2 daughters at bedside   Disposition Plan: Status is: Inpatient Remains inpatient appropriate because: Severe illness   Planned Discharge Destination: TBD remained   Diet: Diet Order             DIET DYS 2 Room service appropriate? No; Fluid consistency: Thin  Diet effective now                       MEDICATIONS: Scheduled Meds:  ipratropium  0.5 mg Nebulization Q6H   levalbuterol   0.63 mg Nebulization Q6H   Continuous Infusions:  morphine  8 mg/hr (11/13/23 2334)    PRN Meds:.acetaminophen  **OR** acetaminophen , antiseptic oral rinse, artificial tears, benzonatate , glycopyrrolate , LORazepam , melatonin, morphine , ondansetron  **OR** ondansetron  (ZOFRAN ) IV, mouth rinse   I have personally reviewed following labs and imaging studies  LABORATORY DATA: CBC: Recent Labs  Lab 11/08/23 1716 11/09/23 1713 11/10/23 0346 11/11/23 0320  WBC 9.0 9.9 11.2* 6.8  NEUTROABS 8.0*  --   --   --   HGB 8.2* 10.1* 8.3* 8.3*  HCT 27.0* 32.6* 26.0* 26.4*  MCV 96.1 93.4 92.2 93.0  PLT 213 249 216 200    Basic Metabolic Panel: Recent Labs  Lab 11/08/23 1716 11/09/23 0413 11/09/23 1713 11/10/23 0346 11/11/23 0320  NA 126* 131* 132* 131* 135  K 3.8 3.2* 4.4 3.6 3.8  CL 95* 101 100 102 100  CO2 19* 19* 22 19* 22  GLUCOSE 141* 117* 145* 116* 123*  BUN 29* 19 20 27* 23  CREATININE 0.79 0.69 0.73 0.77 0.68  CALCIUM  8.0* 7.9* 8.7* 8.3* 8.5*  MG  --   --  2.1 1.9  --   PHOS  --   --  3.7 3.9  3.8  --     GFR: Estimated Creatinine Clearance: 62.3 mL/min (by C-G formula based on SCr of 0.68 mg/dL).  Liver Function Tests: Recent Labs  Lab 11/08/23 1716 11/10/23 0346 11/11/23 0320  AST 36  --  45*  ALT 30  --  42  ALKPHOS 81  --  82  BILITOT 0.5  --  0.6  PROT 5.9*  --  5.7*  ALBUMIN  2.3* 2.4* 2.5*   No results for input(s): LIPASE, AMYLASE in the last 168 hours. No results for input(s):  AMMONIA in the last 168 hours.  Coagulation Profile: Recent Labs  Lab 11/08/23 1716  INR 1.3*    Cardiac Enzymes: No results for input(s): CKTOTAL, CKMB, CKMBINDEX, TROPONINI in the last 168 hours.  BNP (last 3 results) No results for input(s): PROBNP in the last 8760 hours.  Lipid Profile: No results for input(s): CHOL, HDL, LDLCALC, TRIG, CHOLHDL, LDLDIRECT in the last 72 hours.  Thyroid  Function Tests: No results for input(s): TSH, T4TOTAL, FREET4, T3FREE, THYROIDAB in the last 72 hours.  Anemia Panel: No results for input(s): VITAMINB12, FOLATE, FERRITIN, TIBC, IRON , RETICCTPCT in the last 72 hours.  Urine analysis:    Component Value Date/Time   COLORURINE YELLOW 11/08/2023 2319   APPEARANCEUR CLEAR 11/08/2023 2319   LABSPEC 1.023 11/08/2023 2319   PHURINE 6.0 11/08/2023 2319   GLUCOSEU NEGATIVE 11/08/2023 2319   GLUCOSEU NEGATIVE 05/21/2023 1448   HGBUR NEGATIVE 11/08/2023 2319   BILIRUBINUR NEGATIVE 11/08/2023 2319   KETONESUR NEGATIVE 11/08/2023 2319   PROTEINUR NEGATIVE 11/08/2023 2319   UROBILINOGEN 1.0 05/21/2023  1448   NITRITE NEGATIVE 11/08/2023 2319   LEUKOCYTESUR NEGATIVE 11/08/2023 2319    Sepsis Labs: Lactic Acid, Venous    Component Value Date/Time   LATICACIDVEN 1.2 11/08/2023 2331    MICROBIOLOGY: Recent Results (from the past 240 hours)  Culture, blood (Routine x 2)     Status: None   Collection Time: 11/08/23  5:15 PM   Specimen: BLOOD  Result Value Ref Range Status   Specimen Description BLOOD SITE NOT SPECIFIED  Final   Special Requests   Final    BOTTLES DRAWN AEROBIC AND ANAEROBIC Blood Culture results may not be optimal due to an inadequate volume of blood received in culture bottles   Culture   Final    NO GROWTH 5 DAYS Performed at [facility], [address], [place], [place] [address]    Report Status 11/13/2023 FINAL  Final  Culture, blood (Routine x 2)     Status: None    Collection Time: 11/08/23  5:28 PM   Specimen: BLOOD  Result Value Ref Range Status   Specimen Description BLOOD SITE NOT SPECIFIED  Final   Special Requests   Final    BOTTLES DRAWN AEROBIC AND ANAEROBIC Blood Culture results may not be optimal due to an inadequate volume of blood received in culture bottles   Culture   Final    NO GROWTH 5 DAYS Performed at [facility], [address] [place], [place] [address]    Report Status 11/13/2023 FINAL  Final  Resp panel by RT-PCR (RSV, Flu A&B, Covid) Anterior Nasal Swab     Status: None   Collection Time: 11/09/23  3:29 AM   Specimen: Anterior Nasal Swab  Result Value Ref Range Status   SARS Coronavirus 2 by RT PCR NEGATIVE NEGATIVE Final   Influenza A by PCR NEGATIVE NEGATIVE Final   Influenza B by PCR NEGATIVE NEGATIVE Final    Comment: (NOTE) The Xpert Xpress SARS-CoV-2/FLU/RSV plus assay is intended as an aid in the diagnosis of influenza from Nasopharyngeal swab specimens and should not be used as a sole basis for treatment. Nasal washings and aspirates are unacceptable for Xpert Xpress SARS-CoV-2/FLU/RSV testing.  Fact Sheet for Patients: BloggerCourse.com  Fact Sheet for Healthcare Providers: SeriousBroker.it  This test is not yet approved or cleared by the [place]  FDA and has been authorized for detection and/or diagnosis of SARS-CoV-2 by FDA under an Emergency Use Authorization (EUA). This EUA will remain in effect (meaning this test can be used) for the duration of the COVID-19 declaration under Section 564(b)(1) of the Act, 21 U.S.C. section 360bbb-3(b)(1), unless the authorization is terminated or revoked.     Resp Syncytial Virus by PCR NEGATIVE NEGATIVE Final    Comment: (NOTE) Fact Sheet for Patients: BloggerCourse.com  Fact Sheet for Healthcare Providers: SeriousBroker.it  This test is not yet  approved or cleared by the [place]  FDA and has been authorized for detection and/or diagnosis of SARS-CoV-2 by FDA under an Emergency Use Authorization (EUA). This EUA will remain in effect (meaning this test can be used) for the duration of the COVID-19 declaration under Section 564(b)(1) of the Act, 21 U.S.C. section 360bbb-3(b)(1), unless the authorization is terminated or revoked.  Performed at [facility], [address], [place], [place] [address]   Respiratory (~20 pathogens) panel by PCR     Status: Abnormal   Collection Time: 11/09/23  3:29 AM   Specimen: Nasopharyngeal Swab; Respiratory  Result Value Ref  Range Status   Adenovirus NOT DETECTED NOT DETECTED Final   Coronavirus 229E NOT DETECTED NOT DETECTED Final    Comment: (NOTE) The Coronavirus on the Respiratory Panel, DOES NOT test for the novel  Coronavirus (2019 nCoV)    Coronavirus HKU1 NOT DETECTED NOT DETECTED Final   Coronavirus NL63 NOT DETECTED NOT DETECTED Final   Coronavirus OC43 NOT DETECTED NOT DETECTED Final   Metapneumovirus NOT DETECTED NOT DETECTED Final   Rhinovirus / Enterovirus DETECTED (A) NOT DETECTED Final   Influenza A NOT DETECTED NOT DETECTED Final   Influenza B NOT DETECTED NOT DETECTED Final   Parainfluenza Virus 1 NOT DETECTED NOT DETECTED Final   Parainfluenza Virus 2 NOT DETECTED NOT DETECTED Final   Parainfluenza Virus 3 NOT DETECTED NOT DETECTED Final   Parainfluenza Virus 4 NOT DETECTED NOT DETECTED Final   Respiratory Syncytial Virus NOT DETECTED NOT DETECTED Final   Bordetella pertussis NOT DETECTED NOT DETECTED Final   Bordetella Parapertussis NOT DETECTED NOT DETECTED Final   Chlamydophila pneumoniae NOT DETECTED NOT DETECTED Final   Mycoplasma pneumoniae NOT DETECTED NOT DETECTED Final    Comment: Performed at [facility], [address], [place], [place] [address]  MRSA Next Gen by PCR, Nasal     Status: None   Collection Time: 11/09/23  4:57 AM  Result  Value Ref Range Status   MRSA by PCR Next Gen NOT DETECTED NOT DETECTED Final    Comment: (NOTE) The GeneXpert MRSA Assay (FDA approved for NASAL specimens only), is one component of a comprehensive MRSA colonization surveillance program. It is not intended to diagnose MRSA infection nor to guide or monitor treatment for MRSA infections. Test performance is not FDA approved in patients less than 58 years old. Performed at [facility], [address], [place], [place] [address]     RADIOLOGY STUDIES/RESULTS: No results found.    LOS: 6 days   Lavada Stank, MD  [facility]    To contact the attending provider between 7A-7P or the covering provider during after hours 7P-7A, please log into the web site [email or web address] and access using universal [place] password for that web site. If you do not have the password, please call the hospital operator.  15-Nov-2023, 10:20 AM

## 2023-11-24 NOTE — Progress Notes (Signed)
 Palliative:  HPI: 89 y.o. male  with past medical history of Afib, CVA (04/2023), lung cancer s/p CRT, hx of prostate and bladder cancer admitted on 11/08/2023 with acute hypoxic respiratory failure 2/2 rhinovirus pneumonia. Patient presented to ED with shortness of breath and syncope in wheelchair without fall after completing CRT 1 month ago for lung cancer. During hospital stay the patient has increased oxygen requirement and now on heated high flow [place] at 40 L/min and 80% FiO2. Palliative consulted for discussion of goals of care.   I reviewed records and met with family (wife and 2 daughters) at Mr. Utsey bedside. Family tell me that they had just called to discuss comfort and oxygen. Family share that they know that he is ready and want to ensure that there is nothing that we are doing to prolong him here like this. We discussed the option to remove nasal cannula and I discussed that this is unlikely to cause any discomfort but he may need an extra dose of morphine  to assist with the transition. Family all agree that they would like to remove oxygen. Oxygen removed and Mr. Demonte did have a couple episodes of moaning and heavier breathing that was alleviated with bolus of 3 mg morphine .   Family share that they have had a good life together and so many good memories. They have enjoyed sharing these good memories at the bedside during this time. They ask about 85 yo great granddaughter and what to say to her. They are a spiritual family so I explained that they can tell the granddaughter that he was sick and that he was in heaven now when asked. We discussed that at 89 yo age they often take this so much better than we do and take this at face value.   As we were talking we noted Mr. Shorey breathing became very shallow. Family gathered at his side as he took his last breathes. Observed and auscultated with no respirations, no breath sounds, no heart sounds, no pulse - time of death 1130.  Discussed next steps with family. Notified Dr. Dennise and RN.   50 min  Bernarda Kitty, NP Palliative Medicine Team Pager [phone] (Please see [email or web address] for schedule) Team Phone [phone]

## 2023-11-24 DEATH — deceased

## 2023-12-07 ENCOUNTER — Other Ambulatory Visit

## 2023-12-07 ENCOUNTER — Ambulatory Visit

## 2023-12-07 ENCOUNTER — Ambulatory Visit: Admitting: Physician Assistant

## 2024-01-04 ENCOUNTER — Ambulatory Visit

## 2024-01-04 ENCOUNTER — Encounter: Admitting: Dietician

## 2024-01-04 ENCOUNTER — Ambulatory Visit: Admitting: Internal Medicine

## 2024-01-04 ENCOUNTER — Other Ambulatory Visit

## 2024-01-06 ENCOUNTER — Encounter (HOSPITAL_COMMUNITY)

## 2024-01-06 ENCOUNTER — Ambulatory Visit

## 2024-01-06 ENCOUNTER — Other Ambulatory Visit (HOSPITAL_COMMUNITY)

## 2024-07-10 ENCOUNTER — Ambulatory Visit
